# Patient Record
Sex: Female | Born: 1935
Health system: Southern US, Community
[De-identification: ages and names within clinical notes are randomized; demographics above are authoritative.]

## PROBLEM LIST (undated history)

## (undated) DIAGNOSIS — K219 Gastro-esophageal reflux disease without esophagitis: Secondary | ICD-10-CM

## (undated) DIAGNOSIS — M533 Sacrococcygeal disorders, not elsewhere classified: Principal | ICD-10-CM

## (undated) DIAGNOSIS — K56609 Unspecified intestinal obstruction, unspecified as to partial versus complete obstruction: Secondary | ICD-10-CM

## (undated) DIAGNOSIS — R51 Headache: Secondary | ICD-10-CM

## (undated) DIAGNOSIS — H00019 Hordeolum externum unspecified eye, unspecified eyelid: Secondary | ICD-10-CM

## (undated) DIAGNOSIS — M1990 Unspecified osteoarthritis, unspecified site: Secondary | ICD-10-CM

## (undated) DIAGNOSIS — Z9889 Other specified postprocedural states: Secondary | ICD-10-CM

## (undated) DIAGNOSIS — Z8679 Personal history of other diseases of the circulatory system: Secondary | ICD-10-CM

## (undated) DIAGNOSIS — J189 Pneumonia, unspecified organism: Secondary | ICD-10-CM

## (undated) DIAGNOSIS — M549 Dorsalgia, unspecified: Secondary | ICD-10-CM

## (undated) DIAGNOSIS — K589 Irritable bowel syndrome without diarrhea: Secondary | ICD-10-CM

## (undated) DIAGNOSIS — G4730 Sleep apnea, unspecified: Secondary | ICD-10-CM

## (undated) DIAGNOSIS — R519 Headache, unspecified: Secondary | ICD-10-CM

## (undated) DIAGNOSIS — K5900 Constipation, unspecified: Secondary | ICD-10-CM

## (undated) DIAGNOSIS — G2581 Restless legs syndrome: Secondary | ICD-10-CM

## (undated) HISTORY — DX: Personal history of other diseases of the circulatory system: Z86.79

## (undated) HISTORY — DX: Headache: R51

## (undated) HISTORY — DX: Hordeolum externum unspecified eye, unspecified eyelid: H00.019

## (undated) HISTORY — DX: Gastro-esophageal reflux disease without esophagitis: K21.9

## (undated) HISTORY — DX: Sleep apnea, unspecified: G47.30

## (undated) HISTORY — DX: Pneumonia, unspecified organism: J18.9

## (undated) HISTORY — DX: Irritable bowel syndrome, unspecified: K58.9

## (undated) HISTORY — DX: Unspecified intestinal obstruction, unspecified as to partial versus complete obstruction: K56.609

## (undated) HISTORY — DX: Unspecified osteoarthritis, unspecified site: M19.90

## (undated) HISTORY — DX: Restless legs syndrome: G25.81

## (undated) HISTORY — DX: Sacrococcygeal disorders, not elsewhere classified: M53.3

## (undated) HISTORY — DX: Other specified postprocedural states: Z98.89

## (undated) HISTORY — DX: Dorsalgia, unspecified: M54.9

## (undated) HISTORY — DX: Headache, unspecified: R51.9

## (undated) HISTORY — DX: Constipation, unspecified: K59.00

---
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
## 1977-09-16 HISTORY — PX: ABDOMINAL HYSTERECTOMY: SHX81

## 1995-09-17 HISTORY — PX: LIVER BIOPSY: SHX301

## 2003-09-17 HISTORY — PX: APOGEE / PERIGEE REPAIR: SHX1182

## 2003-09-17 HISTORY — PX: EYE SURGERY: SHX253

## 2004-09-16 HISTORY — PX: SPINE SURGERY: SHX786

## 2008-06-16 ENCOUNTER — Ambulatory Visit: Payer: Self-pay | Admitting: Sports Medicine

## 2008-06-16 DIAGNOSIS — M2170 Unequal limb length (acquired), unspecified site: Secondary | ICD-10-CM | POA: Insufficient documentation

## 2008-06-16 DIAGNOSIS — M533 Sacrococcygeal disorders, not elsewhere classified: Secondary | ICD-10-CM

## 2008-06-16 DIAGNOSIS — M4120 Other idiopathic scoliosis, site unspecified: Secondary | ICD-10-CM | POA: Insufficient documentation

## 2008-06-16 DIAGNOSIS — Z9889 Other specified postprocedural states: Secondary | ICD-10-CM | POA: Insufficient documentation

## 2008-06-16 HISTORY — DX: Other specified postprocedural states: Z98.89

## 2008-06-16 HISTORY — DX: Sacrococcygeal disorders, not elsewhere classified: M53.3

## 2008-06-29 ENCOUNTER — Encounter: Payer: Self-pay | Admitting: Sports Medicine

## 2008-07-18 ENCOUNTER — Ambulatory Visit: Payer: Self-pay | Admitting: Sports Medicine

## 2008-07-18 DIAGNOSIS — M549 Dorsalgia, unspecified: Secondary | ICD-10-CM | POA: Insufficient documentation

## 2008-07-18 HISTORY — DX: Dorsalgia, unspecified: M54.9

## 2009-01-31 LAB — HM MAMMOGRAPHY: HM Mammogram: NORMAL

## 2009-06-13 ENCOUNTER — Emergency Department (HOSPITAL_COMMUNITY): Admission: EM | Admit: 2009-06-13 | Discharge: 2009-06-13 | Payer: Self-pay | Admitting: Emergency Medicine

## 2009-06-14 ENCOUNTER — Ambulatory Visit (HOSPITAL_COMMUNITY): Admission: RE | Admit: 2009-06-14 | Discharge: 2009-06-14 | Payer: Self-pay | Admitting: *Deleted

## 2009-06-27 ENCOUNTER — Ambulatory Visit (HOSPITAL_COMMUNITY): Admission: RE | Admit: 2009-06-27 | Discharge: 2009-06-28 | Payer: Self-pay | Admitting: Orthopedic Surgery

## 2009-07-06 ENCOUNTER — Encounter: Admission: RE | Admit: 2009-07-06 | Discharge: 2009-07-06 | Payer: Self-pay | Admitting: Family Medicine

## 2010-07-19 ENCOUNTER — Encounter: Payer: Self-pay | Admitting: Family Medicine

## 2010-10-02 ENCOUNTER — Ambulatory Visit
Admission: RE | Admit: 2010-10-02 | Discharge: 2010-10-02 | Payer: Self-pay | Source: Home / Self Care | Attending: Family Medicine | Admitting: Family Medicine

## 2010-10-02 DIAGNOSIS — Z8679 Personal history of other diseases of the circulatory system: Secondary | ICD-10-CM | POA: Insufficient documentation

## 2010-10-02 DIAGNOSIS — K219 Gastro-esophageal reflux disease without esophagitis: Secondary | ICD-10-CM

## 2010-10-02 DIAGNOSIS — H00019 Hordeolum externum unspecified eye, unspecified eyelid: Secondary | ICD-10-CM

## 2010-10-02 DIAGNOSIS — K5900 Constipation, unspecified: Secondary | ICD-10-CM | POA: Insufficient documentation

## 2010-10-02 DIAGNOSIS — M1990 Unspecified osteoarthritis, unspecified site: Secondary | ICD-10-CM | POA: Insufficient documentation

## 2010-10-02 DIAGNOSIS — G2581 Restless legs syndrome: Secondary | ICD-10-CM

## 2010-10-02 HISTORY — DX: Restless legs syndrome: G25.81

## 2010-10-02 HISTORY — DX: Hordeolum externum unspecified eye, unspecified eyelid: H00.019

## 2010-10-02 HISTORY — DX: Gastro-esophageal reflux disease without esophagitis: K21.9

## 2010-10-02 HISTORY — DX: Constipation, unspecified: K59.00

## 2010-10-02 HISTORY — DX: Personal history of other diseases of the circulatory system: Z86.79

## 2010-10-02 HISTORY — DX: Unspecified osteoarthritis, unspecified site: M19.90

## 2010-10-16 NOTE — Letter (Signed)
Summary: Historic Patient File  Historic Patient File   Imported ByLevada Schilling 06/29/2008 16:53:50  _____________________________________________________________________  External Attachment:    Type:   Image     Comment:   External Document

## 2010-10-16 NOTE — Assessment & Plan Note (Signed)
Summary: NP/LOW BACK PAIN/LEG DESCREP/WP   Vital Signs:  Patient Profile:   77 Years Old Female Weight:      154 pounds Pulse rate:   81 / minute BP sitting:   138 / 86  Vitals Entered By: Lillia Pauls CMA (June 16, 2008 11:16 AM)                 Chief Complaint:  NP WITH R HIP PAIN X 1 YR.  History of Present Illness: LBP 2006 had surgery for Pinched nerve - dr Evelene Croon in [place] - back laminectomy L3 to L5 moved [place]  back pain started when she was young and had to [place] in [place] blue berries and mushrooms mother used to "pop back"  went to chiropractic for years when she lived in [place] usually helped for short time - this helped upper part  lower back started hurting 10 years ago she has had mutliple therapies PT at times injections were tried before surgery  saw dr Raquel James who noted leg length difference and rt sij pain wondered if orthotics and rehab might help      Past Medical History:    TIA x 2 in 2004    PUD in oct 2008    Arthritis - OA and RA?    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 No apparent alopecia or balding. Eyes:     glasses Msk:     mild scoliosis laminectomy scar from L2 to L5 area no real spasm of paraspinous MM point tender over the Rt SIJ but not on left very limited FABER on RT at 45 deg ER FABER on Lt about 60 deg ER - tight but much better SLR to 60 deg with no sxs at all SIJ movement limited on Rt  strength good to testing heel walk, toe walk, tandem walk within normal although she does sometimes lose balance with turning a bit  Gait she shows a trendelenburg pattern that is slight but consistent with leg length change  leg length show RT is 1 cm shorter than left  Neurologic:     alert & oriented X3.   motor and sensory test of L exts is not remarkable    Impression & Recommendations:  Problem # 1:  SACROILIAC JOINT DYSFUNCTION (ICD-724.6) This seems to be the source of most of her  pain I am not sure whether the leg length triggered this or changes that occured after her laminectomy surgery That could certainly affect her Rt SIJ area  Given standard hip exercise routine do only 5 to 6 reps of 4 exercises at a time with no weight do these twice daily cont walking   Problem # 2:  UNEQUAL LEG LENGTH (ICD-736.81) will add a temporary heel wedge lift to correct  ~ 1 cm this may be enough and she can place them in multiple shoes for next month and see if it helps if helping continue this and buy from [facility]  if not we can consder a permanent orthotic with a leg length adjustment in future Orders: Heel Cushions-[facility] (E4540)   Problem # 3:  SCOLIOSIS, MILD (ICD-737.30) don't think this is clinically significant follow  Problem # 4:  LAMINECTOMY, LUMBAR, HX OF (ICD-V45.89) I think she has recovered well from this and I don't see radicular or disk problems on eval today  Complete Medication List: 1)  Plavix 75 Mg Tabs (Clopidogrel bisulfate) .Marland Kitchen.. 1 by mouth qd 2)  Crestor 5  Mg Tabs (Rosuvastatin calcium) .Marland Kitchen.. 1 by mouth qd 3)  Darvocet A500 100-500 Mg Tabs (Propoxyphene n-apap) .Marland Kitchen.. 1 by mouth q6 hrs 4)  Mirapex 0.25 Mg Tabs (Pramipexole dihydrochloride) .Marland Kitchen.. 1 by mouth qhs 5)  Pantoprazole Sodium 40 Mg Tbec (Pantoprazole sodium) .Marland Kitchen.. 1 by mouth qd    ]

## 2010-10-16 NOTE — Letter (Signed)
Summary: *Consult Note  [facility]  [address]   [place], [place] [address]   Phone: [phone]  Fax: [phone]    Re:    [name] DOB:    29-Jun-1932  Ursula Beath, MD [facility]  Dear Victorino Dike:    Thank you for requesting that we see the above patient for consultation.  A copy of the detailed office note will be sent under separate cover, for your review.  Evaluation today is consistent with:  1)  LAMINECTOMY, LUMBAR, HX OF (ICD-V45.89) 2)  SCOLIOSIS, MILD (ICD-737.30) 3)  UNEQUAL LEG LENGTH (ICD-736.81) 4)  SACROILIAC JOINT DYSFUNCTION (ICD-724.6)   Our recommendation is for: a series of hip exercise to mobilize her Rt SIJ.  If more sever pain we can consider injection of this joint later.  I also added a heel wedge to help correct the leg length difference.  If that helps sufficiently it is inexpensive.  If not she would be a candidate for an orthotic to help cushion and also correct the leg length difference.  She will follow with you but feel free to send her to me if I need to help further with her care- either for orthotics or injection.  She is a nice lady and very committed to staying fit.  I think we can help her achieve this and I appreciate you allowing me to see her.   New Orders include:  1)  [facility]- Consult Leve 3 [99243] 2)  Heel Cushions-[facility] [L3332]   New Medications started today include:  1)  PLAVIX 75 MG TABS (CLOPIDOGREL BISULFATE) 1 by mouth qd 2)  CRESTOR 5 MG TABS (ROSUVASTATIN CALCIUM) 1 by mouth qd 3)  DARVOCET A500 100-500 MG TABS (PROPOXYPHENE N-APAP) 1 by mouth q6 hrs 4)  MIRAPEX 0.25 MG TABS (PRAMIPEXOLE DIHYDROCHLORIDE) 1 by mouth qhs 5)  PANTOPRAZOLE SODIUM 40 MG TBEC (PANTOPRAZOLE SODIUM) 1 by mouth qd   I changed none of her medications.   Thank you for this consultation.  If you have any further questions regarding the care of this patient, please do not hesitate to contact me @  [phone].  Thank you for this opportunity to look after your patient.  Sincerely,  Vincent Gros MD

## 2010-10-16 NOTE — Assessment & Plan Note (Signed)
Summary: ORTHODICS/JW   Vital Signs:  Patient Profile:   77 Years Old Female Pulse rate:   69 / minute BP sitting:   127 / 74  Vitals Entered By: Lillia Pauls CMA (July 18, 2008 10:11 AM)                 Chief Complaint:  ORTHOTICS.  History of Present Illness: 77 yo F here for orthotics s/p R SI joint pain and noted leg length deformity which was remeasured - L about 1 cm longer than R.  Has been using sports insoles with heel lift in them on right.  Doing exercises which have helped as well.  Noted she was at least 40% improved from last visit.  Previously went to chiropractor with minimal benefit.        Physical Exam  General:     Well-developed,well-nourished,in no acute distress; alert,appropriate and cooperative throughout examination Msk:     Leg length R is 1 cm shorter than left Gait appears normal after orthotics though R shoulder still lower than L - her hips now level.    Impression & Recommendations:  Problem # 1:  UNEQUAL LEG LENGTH (ICD-736.81) Assessment: Unchanged  Orders: [facility]- Est Level  3 (52841) [employer] (L3002) Heel Cushions (L2440)  Patient was fitted for a standard, cushioned, semi-rigid orthotic.  The orthotic was heated and the patient stood on the orthotic blank positioned on the orthotic stand. The patient was positioned in subtalar neutral position and 10 degrees of ankle dorsiflexion in a weight bearing stance. After completion of molding a stable based was applied to the orthotic blank.   The blank was ground to a stable position for weight bearing. size 7 red cambray base F2 black styrofoam base posting 1 cm left to RT orthotic with blue med density EVA additional orthotic padding none  given past in heel wedges to use in birkenstocks and other shoes    Problem # 2:  BACK PAIN (ICD-724.5) Assessment: Improved  Her updated medication list for this problem includes:    Darvocet A500 100-500 Mg Tabs  (Propoxyphene n-apap) .Marland Kitchen... 1 by mouth q6 hrs  Orders: [facility]- Est Level  3 (10272) [employer] [phone])  cont exercises but I do think correcting leg length will help with this   Complete Medication List: 1)  Plavix 75 Mg Tabs (Clopidogrel bisulfate) .Marland Kitchen.. 1 by mouth qd 2)  Crestor 5 Mg Tabs (Rosuvastatin calcium) .Marland Kitchen.. 1 by mouth qd 3)  Darvocet A500 100-500 Mg Tabs (Propoxyphene n-apap) .Marland Kitchen.. 1 by mouth q6 hrs 4)  Mirapex 0.25 Mg Tabs (Pramipexole dihydrochloride) .Marland Kitchen.. 1 by mouth qhs 5)  Pantoprazole Sodium 40 Mg Tbec (Pantoprazole sodium) .Marland Kitchen.. 1 by mouth qd    ]

## 2010-10-18 NOTE — Assessment & Plan Note (Signed)
Summary: new to est---sty on eye--ok per nancy//[facility]   Vital Signs:  Patient profile:   77 year old female Menstrual status:  postmenopausal Height:      64.25 inches Weight:      150 pounds BMI:     25.64 Temp:     98.5 degrees F oral Pulse rate:   80 / minute Pulse rhythm:   regular Resp:     12 per minute BP sitting:   130 / 78  (left arm) Cuff size:   regular  Vitals Entered By: Sid Falcon LPN (2010/10/25 10:38 AM)  Nutrition Counseling: Patient's BMI is greater than 25 and therefore counseled on weight management options.     Menstrual Status postmenopausal   History of Present Illness: New patient to establish care.  Patient has chronic problems including history of recurrent TIA 2004 and 2007. She has osteoarthritis involving multiple joints. History of reported diverticulitis. Intermittent GERD. Some recent problems with intermittent constipation generally relieved with prune juice. History of mild hyperlipidemia. Reported restless leg syndrome treated with Mirapex.  Restless leg symptoms stable.  No side effects from medication.  No recent focal neuro symptoms.  Remains on Plavix and Crestor.  Lipids have been well controlled by review of outside labs.  Constipation hx.  Fairly active and generally drinks plenty of fluids.  Has had prior colonscopy but not sure when.  Old records pending.  No hematochezia.  No appetite or weight changes.  No excessive fatigue issues.  Multiple prior surgeries are reviewed. Total abdominal hysterectomy 1979 secondary to fibroids. Previous appendectomy. Lumbar laminectomy 2006. Bilateral cataract surgery. Benign liver tumor resection 1997, presumably hemangioma. Complicated proximal humerus fracture 2010.  New problem of intermittent stye left eye, recurrent. No visual changes.  Warm compresses with some relief.  Family history significant for mother with osteoarthritis.  Patient is married. Nonsmoker. Rare alcohol  use.  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    Physical Exam  General:  Well-developed,well-nourished,in no acute distress; alert,appropriate and  cooperative throughout examination Head:  Normocephalic and atraumatic without  obvious abnormalities. No apparent alopecia or balding. Eyes:  small stye L lower lid. Ears:  External ear exam shows no significant lesions or deformities.  Otoscopic examination reveals clear canals, tympanic membranes are intact bilaterally without bulging, retraction, inflammation or discharge. Hearing is grossly normal bilaterally. Nose:  External nasal examination shows no deformity or inflammation. Nasal mucosa are pink and moist without lesions or exudates. Mouth:  Oral mucosa and oropharynx without lesions or exudates.  Teeth in good repair. Neck:  No deformities, masses, or tenderness noted. Lungs:  Normal respiratory effort, chest expands symmetrically. Lungs are clear to auscultation, no crackles or wheezes. Heart:  normal rate and regular rhythm.   Abdomen:  soft, non-tender, no distention, and no masses.   Msk:  No deformity or scoliosis noted of thoracic or lumbar spine.   Extremities:  No clubbing, cyanosis, edema, or deformity noted with normal full range of motion of all joints.   Neurologic:  alert & oriented X3 and cranial nerves II-XII intact.   Skin:  no rashes and no ecchymoses.   Cervical Nodes:  No lymphadenopathy noted Psych:  Oriented X3, good eye contact, not anxious appearing, and not depressed appearing.     Impression & Recommendations:  Problem # 1:  STYE (ICD-373.11) warm compresses.  Tobrex eye drops.  Problem # 2:  TRANSIENT ISCHEMIC ATTACKS, HX OF (ICD-V12.50) cont with plavix and Crestor.  BP at goal.  Problem # 3:  CONSTIPATION (ICD-564.00) discussed conservative measures.  Problem # 4:  RESTLESS LEG SYNDROME (ICD-333.94)  Problem # 5:  GERD (ICD-530.81)  Her updated medication list for this problem includes:    Lansoprazole 30 Mg Cpdr (Lansoprazole) .Marland Kitchen... As needed for stomach  Problem # 6:  OSTEOARTHRITIS (ICD-715.90)  Her updated medication list for this problem includes:    Tramadol Hcl 50 Mg Tabs (Tramadol hcl) ..... One tab every 6-8  hours as needed pain  Complete Medication List: 1)  Plavix 75 Mg Tabs (Clopidogrel bisulfate) .Marland Kitchen.. 1 by mouth qd 2)  Crestor 5 Mg Tabs (Rosuvastatin calcium) .Marland Kitchen.. 1 by mouth qd 3)  Tramadol Hcl 50 Mg Tabs (Tramadol hcl) .... One tab every 6-8 hours as needed pain 4)  Mirapex 0.25 Mg Tabs (Pramipexole dihydrochloride) .Marland Kitchen.. 1 by mouth qhs 5)  Lansoprazole 30 Mg Cpdr (Lansoprazole) .... As needed for stomach 6)  Tobrex 0.3 % Soln (Tobramycin sulfate) .... 2 drops os qid for 5 days  Patient Instructions: 1)  Please schedule a follow-up appointment in 6 months .  Prescriptions: TOBREX 0.3 % SOLN (TOBRAMYCIN SULFATE) 2 drops OS qid for 5 days  #5 ml x 1   Entered and Authorized by:   Evelena Peat MD   Signed by:   Evelena Peat MD on 10/02/2010   Method used:   Electronically to        [facility]  [place] [address] (retail)       [address]       [place], [place]  [address]       Ph: [phone] or [phone]       Fax: [phone]   RxID:   [id number]    Orders Added: 1)  New Patient Level IV [76283]   Immunization History:  Influenza Immunization History:    Influenza:  historical (07/17/2010)  Pneumovax Immunization History:    Pneumovax:  historical (07/17/2010)    Pneumovax:  given (07/17/2010)   Immunization History:  Influenza  Immunization History:    Influenza:  Historical (07/17/2010)  Pneumovax Immunization History:    Pneumovax:  Historical (07/17/2010)  Preventive Care Screening  Last Pneumovax:    Date:  07/17/2010    Results:  given   Bone Density:    Date:  01/16/2009    Results:  normal std dev  Mammogram:    Date:  01/16/2009    Results:  normal

## 2010-12-10 ENCOUNTER — Telehealth: Payer: Self-pay | Admitting: *Deleted

## 2010-12-10 MED ORDER — AZITHROMYCIN 250 MG PO TABS: 250.0000 mg | ORAL_TABLET | Freq: Every day | ORAL | Status: AC

## 2010-12-10 NOTE — Telephone Encounter (Signed)
Pt husband here for cough, congestion, requesting same med for his wife who has the same sx, OK per Dr Caryl Never

## 2010-12-20 LAB — BASIC METABOLIC PANEL
BUN: 15 mg/dL (ref 6–23)
BUN: 8 mg/dL (ref 6–23)
CO2: 23 mEq/L (ref 19–32)
CO2: 26 mEq/L (ref 19–32)
Calcium: 9 mg/dL (ref 8.4–10.5)
Calcium: 9.6 mg/dL (ref 8.4–10.5)
Chloride: 108 mEq/L (ref 96–112)
Chloride: 109 mEq/L (ref 96–112)
Creatinine, Ser: 0.82 mg/dL (ref 0.4–1.2)
Creatinine, Ser: 0.82 mg/dL (ref 0.4–1.2)
GFR calc Af Amer: >60 mL/min (ref >60)
GFR calc Af Amer: >60 mL/min (ref >60)
GFR calc non Af Amer: >60 mL/min (ref >60)
GFR calc non Af Amer: >60 mL/min (ref >60)
Glucose, Bld: 116 mg/dL — ABNORMAL HIGH (ref 70–99)
Glucose, Bld: 131 mg/dL — ABNORMAL HIGH (ref 70–99)
Potassium: 3.9 mEq/L (ref 3.5–5.1)
Potassium: 4.1 mEq/L (ref 3.5–5.1)
Sodium: 138 mEq/L (ref 135–145)
Sodium: 139 mEq/L (ref 135–145)

## 2010-12-20 LAB — CBC
HCT: 31.8 % — ABNORMAL LOW (ref 36.0–46.0)
HCT: 38.6 % (ref 36.0–46.0)
Hemoglobin: 10.9 g/dL — ABNORMAL LOW (ref 12.0–15.0)
Hemoglobin: 13.4 g/dL (ref 12.0–15.0)
MCHC: 34.3 g/dL (ref 30.0–36.0)
MCHC: 34.6 g/dL (ref 30.0–36.0)
MCV: 89 fL (ref 78.0–100.0)
MCV: 89.5 fL (ref 78.0–100.0)
Platelets: 161 10*3/uL (ref 150–400)
Platelets: 211 10*3/uL (ref 150–400)
RBC: 3.57 MIL/uL — ABNORMAL LOW (ref 3.87–5.11)
RBC: 4.32 MIL/uL (ref 3.87–5.11)
RDW: 14 % (ref 11.5–15.5)
RDW: 14.1 % (ref 11.5–15.5)
WBC: 5.4 10*3/uL (ref 4.0–10.5)
WBC: 6.6 10*3/uL (ref 4.0–10.5)

## 2011-02-06 ENCOUNTER — Other Ambulatory Visit: Payer: Self-pay | Admitting: Family Medicine

## 2011-02-06 MED ORDER — CLOPIDOGREL BISULFATE 75 MG PO TABS: 75.0000 mg | ORAL_TABLET | Freq: Every day | ORAL | Status: DC

## 2011-02-06 NOTE — Telephone Encounter (Signed)
Pt called and is req refill of Plavix 75 mg 3 month supply to [facility] order.

## 2011-02-06 NOTE — Telephone Encounter (Signed)
Rx sent 

## 2011-02-21 ENCOUNTER — Other Ambulatory Visit: Payer: Self-pay | Admitting: Family Medicine

## 2011-02-21 MED ORDER — PRAMIPEXOLE DIHYDROCHLORIDE 0.25 MG PO TABS: 0.2500 mg | ORAL_TABLET | Freq: Every day | ORAL | Status: DC

## 2011-02-21 NOTE — Telephone Encounter (Signed)
PC made to pt home explaining Dr Caryl Never requested return OV in 6 months, to establish first visit was Jan 2011.  I did fill 90 day request to [facility] with 0 refills requesting she call for 6 month return OV

## 2011-02-21 NOTE — Telephone Encounter (Signed)
Pt req new script for pramipexole (MIRAPEX) 0.25 MG tablet to [facility] 90 day supply.

## 2011-04-03 ENCOUNTER — Ambulatory Visit (INDEPENDENT_AMBULATORY_CARE_PROVIDER_SITE_OTHER): Payer: Medicare Other | Admitting: Family Medicine

## 2011-04-03 ENCOUNTER — Encounter: Payer: Self-pay | Admitting: Family Medicine

## 2011-04-03 DIAGNOSIS — M533 Sacrococcygeal disorders, not elsewhere classified: Secondary | ICD-10-CM

## 2011-04-03 DIAGNOSIS — M1990 Unspecified osteoarthritis, unspecified site: Secondary | ICD-10-CM

## 2011-04-03 DIAGNOSIS — G2581 Restless legs syndrome: Secondary | ICD-10-CM

## 2011-04-03 MED ORDER — PRAMIPEXOLE DIHYDROCHLORIDE 0.25 MG PO TABS: 0.2500 mg | ORAL_TABLET | Freq: Every day | ORAL | Status: DC

## 2011-04-03 MED ORDER — ROSUVASTATIN CALCIUM 5 MG PO TABS: 5.0000 mg | ORAL_TABLET | Freq: Every day | ORAL | Status: DC

## 2011-04-03 NOTE — Progress Notes (Signed)
  Subjective:    Patient ID: Briana Fitzgerald, female    DOB: [birth year], 77 y.o.   MRN: [id number]  HPI Patient seen for medical followup. She has problems with sacroiliac dysfunction and some chronic back pain and sacroiliac pain on the right. She has been diagnosed with unequal leg length previously and has inserts. She has mild scoliosis. History of lumbar laminectomy. Some ongoing right sacroiliac pain. Recently used generic tramadol and had bad dreams.   Tylenol without much relief. Has previously had some relief with nonsteroidals.  Takes Prevacid 30 mg day as needed.  Hyperlipidemia treated Crestor 5 mg daily. No myalgias. Lipids have been stable. No history of CAD  History restless leg syndrome. Still has some symptoms with Mirapex 0.25 mg daily. She would like to consider titrating dosage.  No excessive caffeine use.   Review of Systems  Constitutional: Negative for fever, chills, fatigue and unexpected weight change.  Respiratory: Negative for shortness of breath.   Cardiovascular: Negative for chest pain, palpitations and leg swelling.  Genitourinary: Negative for dysuria.  Musculoskeletal: Positive for back pain and arthralgias. Negative for myalgias and joint swelling.  Neurological: Negative for dizziness and headaches.  Hematological: Negative for adenopathy. Does not bruise/bleed easily.  Psychiatric/Behavioral: Negative for dysphoric mood.       Objective:   Physical Exam  Constitutional: She is oriented to person, place, and time. She appears well-developed and well-nourished.  HENT:  Head: Normocephalic and atraumatic.  Mouth/Throat: Oropharynx is clear and moist. No oropharyngeal exudate.  Eyes: Pupils are equal, round, and reactive to light.  Neck: Neck supple. No thyromegaly present.  Cardiovascular: Normal rate, regular rhythm and normal heart sounds.   Pulmonary/Chest: Effort normal and breath sounds normal. No respiratory distress. She has no wheezes. She has  no rales.  Musculoskeletal: She exhibits no edema.       Fairly good range of motion right hip with internal and external rotation. She has some mild right sacroiliac tenderness  Lymphadenopathy:    She has no cervical adenopathy.  Neurological: She is alert and oriented to person, place, and time.  Psychiatric: She has a normal mood and affect. Her behavior is normal.          Assessment & Plan:  #1 history of right sacroiliac dysfunction/pain. No x-rays in several years. Consider repeat x-rays. Discussed other options. Celebrex 200 mg once daily. May benefit from injection but assess response to Celebrex first #2 hyperlipidemia. Continue Crestor 5 mg daily #3 restless leg symptoms. Titrate Mirapex to 0.5 mg daily.

## 2011-04-03 NOTE — Patient Instructions (Signed)
Celebrex one daily for arthritis pain.

## 2011-04-04 ENCOUNTER — Ambulatory Visit (INDEPENDENT_AMBULATORY_CARE_PROVIDER_SITE_OTHER)
Admission: RE | Admit: 2011-04-04 | Discharge: 2011-04-04 | Disposition: A | Discharge status: Home / Self Care | Payer: Medicare Other | Source: Ambulatory Visit | Attending: Family Medicine | Admitting: Family Medicine

## 2011-04-04 DIAGNOSIS — M533 Sacrococcygeal disorders, not elsewhere classified: Secondary | ICD-10-CM

## 2011-04-04 NOTE — Progress Notes (Signed)
Quick Note:  Pt informed ______ 

## 2011-04-05 ENCOUNTER — Other Ambulatory Visit: Payer: Self-pay | Admitting: *Deleted

## 2011-04-05 MED ORDER — PRAMIPEXOLE DIHYDROCHLORIDE 0.25 MG PO TABS: ORAL_TABLET | ORAL | Status: DC

## 2011-04-17 ENCOUNTER — Ambulatory Visit (INDEPENDENT_AMBULATORY_CARE_PROVIDER_SITE_OTHER): Payer: Medicare Other | Admitting: Sports Medicine

## 2011-04-17 DIAGNOSIS — M2170 Unequal limb length (acquired), unspecified site: Secondary | ICD-10-CM

## 2011-04-17 DIAGNOSIS — M999 Biomechanical lesion, unspecified: Secondary | ICD-10-CM

## 2011-04-17 DIAGNOSIS — M533 Sacrococcygeal disorders, not elsewhere classified: Secondary | ICD-10-CM

## 2011-04-17 NOTE — Patient Instructions (Signed)
Pt given handout and written instructions.

## 2011-04-17 NOTE — Assessment & Plan Note (Addendum)
Manipulated to day with improvement.  Pt given verbal consent.   Given exercises to help improve strength of hip musculature and core strength.  Actual leg length difference is small and may correct  Almost completely with manipulation Minimal  Adjustment made to  Orthotics but these do improve gait  Fitted with orthotics see procedure not above.  RTC prn pt told can have more manipulation done by Dr. Katrinka Blazing if necessary at [facility].

## 2011-04-17 NOTE — Assessment & Plan Note (Signed)
Manipulated to day with improvement.  Pt given verbal consent.   Given exercises to help improve strength of hip musculature and core strength.  Fitted with orthotics see procedure not above.  RTC prn 

## 2011-04-17 NOTE — Assessment & Plan Note (Signed)
Manipulated to day with improvement.  Pt given verbal consent.   Given exercises to help improve strength of hip musculature and core strength.  Fitted with orthotics see procedure not above.  RTC prn

## 2011-04-17 NOTE — Progress Notes (Signed)
  Subjective:    Patient ID: Briana Fitzgerald, female    DOB: 31-Jul-1932, 77 y.o.   MRN: [id number]  HPI  Patient referred here for possible orthotics.  She has problems with sacroiliac dysfunction and some chronic back pain and sacroiliac pain on the right. She has been diagnosed with unequal leg length previously and has inserts which has helped somewhat over time.  History of lumbar laminectomy. Pt does notice with increasing activity will have some problems but overall does not know which movements cause to worsen pain or really improve pain. Pt still likes to be active and mostly does walking for exercise.   Orthotics have reduced her pain enough to where she can continue hiking and on a pretty vigorous walking program for her age.  She comes for a new pair to use.    Review of Systems  Constitutional: Negative for fever, fatigue and unexpected weight change.  Respiratory: Negative for shortness of breath.   Cardiovascular: Negative for chest pain, palpitations and leg swelling.  Genitourinary: Negative for dysuria.  Musculoskeletal: Positive for back pain and arthralgias. Negative for myalgias and joint swelling.  Neurological: Negative for dizziness and headaches.  Hematological: Negative for adenopathy. Does not bruise/bleed easily.  Psychiatric/Behavioral: Negative for dysphoric mood.       Objective:   Physical Exam  Constitutional: She is oriented to person, place, and time. She appears well-developed and well-nourished.  Musculoskeletal: Normal range of motion. She exhibits no edema.       Fairly good range of motion right hip with internal and external rotation. OMT Findings: Thoracic: T5 rotated and side bent right Lumbar: L2 rotated and side bent right Sacrum: left on left with tenderness over right superior pole.    Neurological: She is alert and oriented to person, place, and time.  mild leg length discrepancy on right but minimal.   Note walking gait shows  trendelenburg to Rt without orthotics but after orthotics placed she has much less shift;  Manipulation did seem to lessen the degree of leg length difference so suspect some of this is at pelvis w rotation    Patient was fitted for a : standard, cushioned, semi-rigid orthotic. The orthotic was heated and afterward the patient stood on the orthotic blank positioned on the orthotic stand. The patient was positioned in subtalar neutral position and 10 degrees of ankle dorsiflexion in a weight bearing stance. After completion of molding, a stable base was applied to the orthotic blank. The blank was ground to a stable position for weight bearing. Size:7 Base:2 blue EVA on right  Posting:to help with leg length discrepancy ( 1 inch) Additional orthotic padding:5th ray on right     Assessment & Plan:

## 2011-04-22 LAB — HM MAMMOGRAPHY: HM Mammogram: NEGATIVE

## 2011-04-24 ENCOUNTER — Encounter: Payer: Self-pay | Admitting: Family Medicine

## 2011-05-01 ENCOUNTER — Encounter: Payer: Self-pay | Admitting: Family Medicine

## 2011-05-01 ENCOUNTER — Ambulatory Visit (INDEPENDENT_AMBULATORY_CARE_PROVIDER_SITE_OTHER): Payer: Medicare Other | Admitting: Family Medicine

## 2011-05-01 DIAGNOSIS — G2581 Restless legs syndrome: Secondary | ICD-10-CM

## 2011-05-01 DIAGNOSIS — M1990 Unspecified osteoarthritis, unspecified site: Secondary | ICD-10-CM

## 2011-05-01 DIAGNOSIS — M810 Age-related osteoporosis without current pathological fracture: Secondary | ICD-10-CM

## 2011-05-01 MED ORDER — CELECOXIB 200 MG PO CAPS: 200.0000 mg | ORAL_CAPSULE | Freq: Every day | ORAL | Status: DC

## 2011-05-01 MED ORDER — AZITHROMYCIN 250 MG PO TABS: ORAL_TABLET | ORAL | Status: AC

## 2011-05-01 MED ORDER — PRAMIPEXOLE DIHYDROCHLORIDE 0.5 MG PO TABS: 0.5000 mg | ORAL_TABLET | Freq: Every day | ORAL | Status: DC

## 2011-05-01 NOTE — Progress Notes (Signed)
  Subjective:    Patient ID: Briana Fitzgerald, female    DOB: 09-04-1932, 77 y.o.   MRN: [id number]  HPI Follow multiple items.  She has history of osteoarthritis and sacroiliac joint pain. Unequal leg length and has orthotics. Recent use of Celebrex which has helped significantly. Prior history of GERD on chronic photon pump inhibitor. Previous intolerance to ibuprofen and Voltaren with gastrointestinal upset. Has tolerated Celebrex without difficulty. This has improved her arthritic pain significantly and she is requesting refills.  Restless leg syndrome. Recently took 0.25 mg Mirapex 2 tablets at night which helped. She would like new prescription at increased dosage.  Osteoporosis by recent DEXA scan at wrist level but osteopenic at hip. She takes calcium and vitamin D. Previous severe GERD and cannot take bisphosphonates. She is reluctant to consider injectable therapy at this time  Past Medical History  Diagnosis Date  . BACK PAIN 07/18/2008  . SACROILIAC JOINT DYSFUNCTION 06/16/2008  . LAMINECTOMY, LUMBAR, HX OF 06/16/2008  . RESTLESS LEG SYNDROME 10/02/2010  . STYE 10/02/2010  . GERD 10/02/2010  . CONSTIPATION 10/02/2010  . OSTEOARTHRITIS 10/02/2010  . TRANSIENT ISCHEMIC ATTACKS, HX OF 10/02/2010   Past Surgical History  Procedure Date  . Appendectomy 1941  . Abdominal hysterectomy 1979    TAH, fibroids  . Liver biopsy 1997    benign, hemangioma resection  . Eye surgery 2005    catarac,both eyes  . Apogee / perigee repair 2005  . Spine surgery 2006    laminectomy    reports that she has never smoked. She does not have any smokeless tobacco history on file. Her alcohol and drug histories not on file. family history includes Cancer in her paternal aunt; Diabetes in her father; Pancreatitis in her brother; and Stroke in her father. Not on File    Review of Systems  Constitutional: Negative for fever, chills, activity change, appetite change and unexpected weight change.  Eyes:  Negative for visual disturbance.  Respiratory: Negative for cough and shortness of breath.   Cardiovascular: Negative for chest pain, palpitations and leg swelling.  Gastrointestinal: Negative for abdominal pain.  Genitourinary: Negative for dysuria.  Neurological: Negative for syncope and headaches.  Hematological: Negative for adenopathy.       Objective:   Physical Exam  Constitutional: She is oriented to person, place, and time. She appears well-developed and well-nourished.  HENT:  Mouth/Throat: Oropharynx is clear and moist.  Neck: Neck supple. No thyromegaly present.  Cardiovascular: Normal rate, regular rhythm and normal heart sounds.   Pulmonary/Chest: Effort normal and breath sounds normal. No respiratory distress. She has no wheezes. She has no rales.  Musculoskeletal: She exhibits no edema.  Lymphadenopathy:    She has no cervical adenopathy.  Neurological: She is alert and oriented to person, place, and time.  Psychiatric: She has a normal mood and affect. Her behavior is normal.          Assessment & Plan:  #1 restless leg syndrome. Increase Mirapex 0.5 mg tablet one at night with refills given #2 osteoarthritis and chronic sacroiliac pain. Refill Celebrex 200 mg once daily and reviewed possible side effects #3 osteoporosis by recent DEXA scan at wrist level. Discussed options. She does not wish to pursue injectables as above-discussed Reclast, Prolia, Forteo. Recommended upper body exercises. Continue calcium vitamin D

## 2011-05-21 ENCOUNTER — Encounter: Payer: Self-pay | Admitting: Family Medicine

## 2011-06-06 ENCOUNTER — Ambulatory Visit: Payer: Medicare Other | Admitting: Family Medicine

## 2011-06-06 DIAGNOSIS — M2170 Unequal limb length (acquired), unspecified site: Secondary | ICD-10-CM

## 2011-06-06 DIAGNOSIS — M533 Sacrococcygeal disorders, not elsewhere classified: Secondary | ICD-10-CM

## 2011-06-06 NOTE — Progress Notes (Signed)
Subjective:    Patient ID: Briana Fitzgerald, female    DOB: [birth year], 77 y.o.   MRN: [id number]  HPI  Briana Fitzgerald is a pleasant 77 yo female patient who was seen on 04/26/11 for SI joint dysfunction and leg length  discrepancy . She had custom orthotic made which she states that every time she uses the blue customs orthotics the sole of her feet starts burning. She tried different socks, different shoes without any improvements, she tried her old red orthotics without any problem and no burning on the sole of her feet. She would like to have her custom orthotics remade.  Patient Active Problem List  Diagnoses  . BACK PAIN  . SACROILIAC JOINT DYSFUNCTION  . UNEQUAL LEG LENGTH  . SCOLIOSIS, MILD  . LAMINECTOMY, LUMBAR, HX OF  . RESTLESS LEG SYNDROME  . STYE  . GERD  . CONSTIPATION  . OSTEOARTHRITIS  . TRANSIENT ISCHEMIC ATTACKS, HX OF  . Nonallopathic lesion of sacral region  . Osteoporosis   Current Outpatient Prescriptions on File Prior to Visit  Medication Sig Dispense Refill  . celecoxib (CELEBREX) 200 MG capsule Take 1 capsule (200 mg total) by mouth daily.  90 capsule  3  . clopidogrel (PLAVIX) 75 MG tablet Take 1 tablet (75 mg total) by mouth daily.  90 tablet  2  . lansoprazole (PREVACID) 30 MG capsule Take 30 mg by mouth as needed. For stomach.       . pramipexole (MIRAPEX) 0.5 MG tablet Take 1 tablet (0.5 mg total) by mouth at bedtime.  90 tablet  3  . rosuvastatin (CRESTOR) 5 MG tablet Take 1 tablet (5 mg total) by mouth daily.  30 tablet  11  . tobramycin (TOBREX) 0.3 % ophthalmic solution Place 2 drops into the left eye 4 (four) times daily. For 5 days.            Review of Systems  Constitutional: Negative for fever, chills and fatigue.  Musculoskeletal: Negative for back pain, joint swelling and gait problem.  Neurological: Negative for tremors, seizures and weakness.       Objective:   Physical Exam  Constitutional: She appears well-developed and  well-nourished.       There were no vitals taken for this visit.   Pulmonary/Chest: Effort normal.  Musculoskeletal:       Feet with intact skin , no swelling no inflammations. Feet with low arch. There is a leg length discrepancy been the right leg shorter than the left leg by 2 cm. Gait with mild shifting to the right side. SI joint with good mobility, faber test negative B/l.   Neurological: She is alert.  Skin: Skin is warm. No rash noted. No erythema. No pallor.  Psychiatric: She has a normal mood and affect.      Patient was fitted for a : standard, cushioned, semi-rigid orthotic.  The orthotic was heated and afterward the patient stood on the orthotic blank positioned on the orthotic stand.  The patient was positioned in subtalar neutral position and 10 degrees of ankle dorsiflexion in a weight bearing stance.  After completion of molding, a stable base was applied to the orthotic blank.  The blank was ground to a stable position for weight bearing.  Size:7  Base:2 Red bases  Posting: doable posting on the right to help with leg length discrepancy ( 1 inch)  Additional orthotic padding:5th ray on right       Assessment & Plan:   1. SACROILIAC JOINT  DYSFUNCTION   2. UNEQUAL LEG LENGTH    Custom orthotic were remade using the red orthotic material.  NO CHARGES WERE MADE FOR THE NEW CUSTOM ORTHOTICS MADE TODAY

## 2011-06-24 ENCOUNTER — Telehealth: Payer: Self-pay | Admitting: Family Medicine

## 2011-06-24 NOTE — Telephone Encounter (Signed)
Pt states the first name is Briana Fitzgerald, "he's a new young guy".  I called the number provided after hours and it was " [facility] ".

## 2011-06-24 NOTE — Telephone Encounter (Signed)
Patient would like to be referred to someone that specializes in osteoporosis. His name is Dr Katrinka Blazing ---phone---[phone].

## 2011-06-24 NOTE — Telephone Encounter (Signed)
Can she give up full name?  I'm not aware of any Dr Lonn Georgia who specialize in osteoporosis.

## 2011-06-24 NOTE — Telephone Encounter (Signed)
Please advise 

## 2011-06-24 NOTE — Telephone Encounter (Signed)
He is not an osteoporosis specialist.  He is a family practice resident.  Would not refer.  I'm not sure how she got his name. We can discuss ALL options available for treating osteoporosis with her.  If she wants to transfer care to him that is OK with me but she needs to understand he is a resident and residents are doctors in training and there for 3 years and then gone (unless they stay in the area to practice).  That is where I precept once per month.

## 2011-06-25 NOTE — Telephone Encounter (Signed)
Pt informed, offered return OV, pt will think about it

## 2011-07-01 ENCOUNTER — Telehealth: Payer: Self-pay | Admitting: Family Medicine

## 2011-07-01 MED ORDER — ROSUVASTATIN CALCIUM 5 MG PO TABS: 5.0000 mg | ORAL_TABLET | Freq: Every day | ORAL | Status: DC

## 2011-07-01 NOTE — Telephone Encounter (Signed)
Pt requesting refill on rosuvastatin (CRESTOR) 5 MG tablet   please send to [facility]

## 2011-08-30 ENCOUNTER — Ambulatory Visit: Payer: Medicare Other | Admitting: Family Medicine

## 2011-09-02 ENCOUNTER — Encounter: Payer: Self-pay | Admitting: Family Medicine

## 2011-09-02 ENCOUNTER — Ambulatory Visit (INDEPENDENT_AMBULATORY_CARE_PROVIDER_SITE_OTHER): Payer: Medicare Other | Admitting: Family Medicine

## 2011-09-02 DIAGNOSIS — E785 Hyperlipidemia, unspecified: Secondary | ICD-10-CM

## 2011-09-02 DIAGNOSIS — G2581 Restless legs syndrome: Secondary | ICD-10-CM

## 2011-09-02 DIAGNOSIS — K219 Gastro-esophageal reflux disease without esophagitis: Secondary | ICD-10-CM

## 2011-09-02 DIAGNOSIS — J329 Chronic sinusitis, unspecified: Secondary | ICD-10-CM

## 2011-09-02 DIAGNOSIS — K5900 Constipation, unspecified: Secondary | ICD-10-CM

## 2011-09-02 LAB — TSH: TSH: 1.63 u[IU]/mL (ref 0.35–5.50)

## 2011-09-02 LAB — HEPATIC FUNCTION PANEL
ALT: 21 U/L (ref 0–35)
AST: 23 U/L (ref 0–37)
Albumin: 3.9 g/dL (ref 3.5–5.2)
Alkaline Phosphatase: 69 U/L (ref 39–117)
Bilirubin, Direct: 0.1 mg/dL (ref 0.0–0.3)
Total Bilirubin: 1.1 mg/dL (ref 0.3–1.2)
Total Protein: 7 g/dL (ref 6.0–8.3)

## 2011-09-02 LAB — LIPID PANEL
Cholesterol: 121 mg/dL (ref 0–200)
HDL: 39.8 mg/dL (ref >39.00)
LDL Cholesterol: 58 mg/dL (ref 0–99)
Total CHOL/HDL Ratio: 3
Triglycerides: 115 mg/dL (ref 0.0–149.0)
VLDL: 23 mg/dL (ref 0.0–40.0)

## 2011-09-02 MED ORDER — HYDROCODONE-HOMATROPINE 5-1.5 MG/5ML PO SYRP: 5.0000 mL | ORAL_SOLUTION | Freq: Four times a day (QID) | ORAL | Status: AC | PRN

## 2011-09-02 MED ORDER — AMOXICILLIN-POT CLAVULANATE 875-125 MG PO TABS: 1.0000 | ORAL_TABLET | Freq: Two times a day (BID) | ORAL | Status: AC

## 2011-09-02 NOTE — Progress Notes (Signed)
  Subjective:    Patient ID: Briana Fitzgerald, female    DOB: Oct 17, 1931, 77 y.o.   MRN: [id number]  HPI  Medical followup and for acute visit. She's had cough for almost one month. Developed initially some sore throat and has had at least 3 weeks of cough productive of green sputum also some nasal congestion with green sputum. Intermittent frontal sinus pressure. No fevers or chills. No known drug allergies. Cough is especially bothersome at night and not relieved with over-the-counter medications.  History of hyperlipidemia. Needs lab work. No myalgias. No history of CAD or peripheral vascular disease.  Restless leg syndrome. Treated with Mirapex and symptoms well controlled. History of GERD treated with Prevacid. No active reflux symptoms.   Review of Systems  Constitutional: Negative for fever, appetite change, fatigue and unexpected weight change.  HENT: Positive for sore throat and sinus pressure.   Respiratory: Positive for cough. Negative for shortness of breath and wheezing.   Cardiovascular: Negative for chest pain, palpitations and leg swelling.  Gastrointestinal: Negative for abdominal pain.  Genitourinary: Negative for dysuria.  Neurological: Negative for dizziness and headaches.       Objective:   Physical Exam  Constitutional: She is oriented to person, place, and time. She appears well-developed and well-nourished. No distress.  HENT:  Right Ear: External ear normal.  Left Ear: External ear normal.  Mouth/Throat: Oropharynx is clear and moist.  Neck: Neck supple.  Cardiovascular: Normal rate and regular rhythm.   Pulmonary/Chest: Effort normal and breath sounds normal. No respiratory distress. She has no wheezes. She has no rales.  Musculoskeletal: She exhibits no edema.  Lymphadenopathy:    She has no cervical adenopathy.  Neurological: She is alert and oriented to person, place, and time.          Assessment & Plan:  #1 acute sinusitis/bronchitis. Given  duration, start Augmentin 875 mg twice daily for 10 days. Hycodan cough syrup for nighttime use as needed #2 restless leg syndrome stable continue current medication #3 GERD stable continue Prevacid #4 hyperlipidemia. Remote history of TIA. Check lipid and hepatic panel

## 2011-09-03 NOTE — Progress Notes (Signed)
Quick Note:  Pt informed on home VM ______ 

## 2011-09-23 ENCOUNTER — Telehealth: Payer: Self-pay | Admitting: Family Medicine

## 2011-09-23 NOTE — Telephone Encounter (Signed)
Pt would like you to mail her a copy of her lab results from 12/17 please.

## 2011-09-23 NOTE — Telephone Encounter (Signed)
Labs mailed

## 2011-10-10 ENCOUNTER — Telehealth: Payer: Self-pay | Admitting: Family Medicine

## 2011-10-10 MED ORDER — CELECOXIB 200 MG PO CAPS: 200.0000 mg | ORAL_CAPSULE | Freq: Every day | ORAL | Status: DC

## 2011-10-10 MED ORDER — PRAMIPEXOLE DIHYDROCHLORIDE 0.5 MG PO TABS: 0.5000 mg | ORAL_TABLET | Freq: Every day | ORAL | Status: DC

## 2011-10-10 MED ORDER — ROSUVASTATIN CALCIUM 5 MG PO TABS: 5.0000 mg | ORAL_TABLET | Freq: Every day | ORAL | Status: DC

## 2011-10-10 MED ORDER — CLOPIDOGREL BISULFATE 75 MG PO TABS: 75.0000 mg | ORAL_TABLET | Freq: Every day | ORAL | Status: DC

## 2011-10-10 MED ORDER — LANSOPRAZOLE 30 MG PO CPDR: 30.0000 mg | DELAYED_RELEASE_CAPSULE | ORAL | Status: DC | PRN

## 2011-10-10 NOTE — Telephone Encounter (Signed)
I could not find [facility] in mail order pharmacy to send electronically.  Printed and faxed as requested

## 2011-10-10 NOTE — Telephone Encounter (Signed)
rx mail order has changed this year. Patient would like for all of her meds sent to new mail order pharmacy which is [facility]---Fax ---[phone]. Thanks.

## 2011-10-25 ENCOUNTER — Encounter: Payer: Self-pay | Admitting: Family Medicine

## 2011-10-25 ENCOUNTER — Ambulatory Visit (INDEPENDENT_AMBULATORY_CARE_PROVIDER_SITE_OTHER): Payer: Medicare Other | Admitting: Family Medicine

## 2011-10-25 VITALS — BP 122/70 | HR 86 | Temp 98.4°F | Wt 152.0 lb

## 2011-10-25 DIAGNOSIS — S0083XA Contusion of other part of head, initial encounter: Secondary | ICD-10-CM

## 2011-10-25 DIAGNOSIS — S0003XA Contusion of scalp, initial encounter: Secondary | ICD-10-CM | POA: Diagnosis not present

## 2011-10-25 NOTE — Patient Instructions (Signed)
Continue with icing for the next day.

## 2011-10-25 NOTE — Progress Notes (Signed)
  Subjective:    Patient ID: Briana Fitzgerald, female    DOB: July 29, 1932, 78 y.o.   MRN: [id number]  HPI  Bruising right chin region. Noted today. Wednesday she went for dental work and had filling right posterior molar area she did not notice any pain afterwards. Denies any injury otherwise. No difficulty swallowing. No headaches. No difficulty chewing. Takes Plavix. No other bleeding complications.   Review of Systems  Constitutional: Negative for fever, chills and unexpected weight change.  Respiratory: Negative for shortness of breath.   Neurological: Negative for headaches.  Hematological: Does not bruise/bleed easily.       Objective:   Physical Exam  Constitutional: She appears well-developed and well-nourished.  HENT:       Patient has area of ecchymosis right chin and lower mandible region approximately 4 x 4 centimeters. Mostly nontender. Oropharynx reveals area of bruising about 4 x 5 mm just superior and lateral to right lower posterior molar.  Neck: Neck supple. No thyromegaly present.  Cardiovascular: Normal rate and regular rhythm.   Pulmonary/Chest: Effort normal and breath sounds normal. No respiratory distress. She has no wheezes. She has no rales.          Assessment & Plan:  Facial contusion. Suspect related to recent dental work. Patient reassured. No evidence for significant hematoma. Continue icing for the next day. Continue Plavix.

## 2012-01-02 ENCOUNTER — Telehealth: Payer: Self-pay | Admitting: Family Medicine

## 2012-01-02 DIAGNOSIS — Z961 Presence of intraocular lens: Secondary | ICD-10-CM | POA: Diagnosis not present

## 2012-01-02 DIAGNOSIS — H353190 Nonexudative age-related macular degeneration, unspecified eye, stage unspecified: Secondary | ICD-10-CM | POA: Diagnosis not present

## 2012-01-02 DIAGNOSIS — H18519 Endothelial corneal dystrophy, unspecified eye: Secondary | ICD-10-CM | POA: Diagnosis not present

## 2012-01-02 NOTE — Telephone Encounter (Signed)
Pt called and has an ov to see Dr Marvis Repress today at 2 pm re: her eyes. Pt said that she needs to get a referral from Dr Caryl Never in order to be able to keep this appt today.

## 2012-01-02 NOTE — Telephone Encounter (Signed)
I called Dr Baxter Hire office and she does not need a referral.  Pt informed

## 2012-02-06 ENCOUNTER — Telehealth: Payer: Self-pay | Admitting: Family Medicine

## 2012-02-06 MED ORDER — PRAMIPEXOLE DIHYDROCHLORIDE 0.5 MG PO TABS: 0.5000 mg | ORAL_TABLET | Freq: Every day | ORAL | Status: DC

## 2012-02-06 NOTE — Telephone Encounter (Signed)
Pt informed

## 2012-02-06 NOTE — Telephone Encounter (Signed)
Pt called and wanted to know if she has sample of generic Mirapex for restless leg syndrome. Pt ordered med from pharmacy via mail order, but med will not be available until next week.

## 2012-02-19 DIAGNOSIS — H18519 Endothelial corneal dystrophy, unspecified eye: Secondary | ICD-10-CM | POA: Diagnosis not present

## 2012-03-02 ENCOUNTER — Ambulatory Visit (INDEPENDENT_AMBULATORY_CARE_PROVIDER_SITE_OTHER): Payer: Medicare Other | Admitting: Family Medicine

## 2012-03-02 ENCOUNTER — Encounter: Payer: Self-pay | Admitting: Family Medicine

## 2012-03-02 VITALS — BP 130/64 | Temp 98.8°F | Wt 155.0 lb

## 2012-03-02 DIAGNOSIS — E785 Hyperlipidemia, unspecified: Secondary | ICD-10-CM

## 2012-03-02 DIAGNOSIS — K5900 Constipation, unspecified: Secondary | ICD-10-CM

## 2012-03-02 DIAGNOSIS — G2581 Restless legs syndrome: Secondary | ICD-10-CM

## 2012-03-02 DIAGNOSIS — M1990 Unspecified osteoarthritis, unspecified site: Secondary | ICD-10-CM | POA: Diagnosis not present

## 2012-03-02 DIAGNOSIS — L723 Sebaceous cyst: Secondary | ICD-10-CM

## 2012-03-02 NOTE — Patient Instructions (Addendum)

## 2012-03-02 NOTE — Progress Notes (Signed)
  Subjective:    Patient ID: Briana Fitzgerald, female    DOB: 25-May-1932, 78 y.o.   MRN: [id number]  HPI  Medical followup. Patient history hyperlipidemia, osteoporosis, GERD, restless leg syndrome and intermittent constipation. Her constipation problem has gone on for several years. She had recent TSH this past December normal. She takes flaxseed and fiber supplements. Tries to eat lots of natural fiber. Drinks lots of water. Walks some for exercise but not consistently.  Cystic lesion left for head. Nonpainful. Patient requesting referral to dermatologist for further evaluation. Never infected. Slow growth over several years.  Immunizations reviewed. No history of shingles vaccine. She thinks last tetanus less than 10 years. Previous Pneumovax.  Hyperlipidemia treated with Crestor. No myalgias. No history of peripheral vascular disease or CAD. Restless leg syndrome stable with Mirapex. We recently increased dosage which has helped somewhat.  Past Medical History  Diagnosis Date  . BACK PAIN 07/18/2008  . SACROILIAC JOINT DYSFUNCTION 06/16/2008  . LAMINECTOMY, LUMBAR, HX OF 06/16/2008  . RESTLESS LEG SYNDROME 10/02/2010  . STYE 10/02/2010  . GERD 10/02/2010  . CONSTIPATION 10/02/2010  . OSTEOARTHRITIS 10/02/2010  . TRANSIENT ISCHEMIC ATTACKS, HX OF 10/02/2010   Past Surgical History  Procedure Date  . Appendectomy 1941  . Abdominal hysterectomy 1979    TAH, fibroids  . Liver biopsy 1997    benign, hemangioma resection  . Eye surgery 2005    catarac,both eyes  . Apogee / perigee repair 2005  . Spine surgery 2006    laminectomy    reports that she has never smoked. She does not have any smokeless tobacco history on file. Her alcohol and drug histories not on file. family history includes Cancer in her paternal aunt; Diabetes in her father; Pancreatitis in her brother; and Stroke in her father. No Known Allergies    Review of Systems  Constitutional: Negative for fatigue.  Eyes:  Negative for visual disturbance.  Respiratory: Negative for cough, chest tightness, shortness of breath and wheezing.   Cardiovascular: Negative for chest pain, palpitations and leg swelling.  Gastrointestinal: Positive for constipation. Negative for nausea, vomiting and abdominal pain.  Genitourinary: Negative for dysuria.  Neurological: Negative for dizziness, seizures, syncope, weakness, light-headedness and headaches.  Hematological: Negative for adenopathy.       Objective:   Physical Exam  Constitutional: She is oriented to person, place, and time. She appears well-developed and well-nourished.  HENT:  Right Ear: External ear normal.  Left Ear: External ear normal.  Mouth/Throat: Oropharynx is clear and moist.  Neck: Neck supple. No thyromegaly present.  Cardiovascular: Normal rate and regular rhythm.   Pulmonary/Chest: Effort normal and breath sounds normal. No respiratory distress. She has no wheezes. She has no rales.  Musculoskeletal: She exhibits no edema.  Lymphadenopathy:    She has no cervical adenopathy.  Neurological: She is alert and oriented to person, place, and time.  Skin:       Patient has mobile cystic lesion left forehead which is approximately 1 cm diameter. No fluctuance. No erythema or warmth.          Assessment & Plan:  #1 constipation. Chronic. Handout given. Consider addition of stool softener as needed. #2 restless leg syndrome stable on Mirapex. Continue current medication #3 hyperlipidemia. Lipids at goal last fall. Recheck lipids and hepatic in 6 months #4 benign cyst left for head. Probable sebaceous cyst. Referral to dermatology per patient request #5 health maintenance. Check on coverage for shingles vaccine

## 2012-03-25 DIAGNOSIS — H18519 Endothelial corneal dystrophy, unspecified eye: Secondary | ICD-10-CM | POA: Diagnosis not present

## 2012-03-25 DIAGNOSIS — H353190 Nonexudative age-related macular degeneration, unspecified eye, stage unspecified: Secondary | ICD-10-CM | POA: Diagnosis not present

## 2012-03-25 DIAGNOSIS — Z961 Presence of intraocular lens: Secondary | ICD-10-CM | POA: Diagnosis not present

## 2012-03-30 ENCOUNTER — Telehealth: Payer: Self-pay | Admitting: Family Medicine

## 2012-03-30 NOTE — Telephone Encounter (Signed)
Patient called stating she was calling the nurse with info she requested and would like a call back. Please assist.

## 2012-03-30 NOTE — Telephone Encounter (Signed)
Pt was given [facility] phone number so she can check on her appt

## 2012-04-01 ENCOUNTER — Telehealth: Payer: Self-pay | Admitting: *Deleted

## 2012-04-01 DIAGNOSIS — E785 Hyperlipidemia, unspecified: Secondary | ICD-10-CM

## 2012-04-01 DIAGNOSIS — K5909 Other constipation: Secondary | ICD-10-CM

## 2012-04-01 NOTE — Telephone Encounter (Signed)
PC from pt requesting referral to a nutritionist for constant struggle with constipation Also requesting a Rx be faxed to [facility] for shingles vaccine  Per Dr Caryl Never, OK to refer to nutrition, shingles vaccine faxed

## 2012-04-02 ENCOUNTER — Other Ambulatory Visit: Payer: Self-pay | Admitting: *Deleted

## 2012-04-02 MED ORDER — ZOSTER VACCINE LIVE 19400 UNT/0.65ML ~~LOC~~ SOLR: 0.6500 mL | Freq: Once | SUBCUTANEOUS | Status: AC

## 2012-04-20 DIAGNOSIS — D485 Neoplasm of uncertain behavior of skin: Secondary | ICD-10-CM | POA: Diagnosis not present

## 2012-04-20 DIAGNOSIS — L723 Sebaceous cyst: Secondary | ICD-10-CM | POA: Diagnosis not present

## 2012-04-27 ENCOUNTER — Telehealth: Payer: Self-pay | Admitting: Family Medicine

## 2012-04-27 NOTE — Telephone Encounter (Signed)
Caller: Marlee/Patient; Patient Name: Briana Fitzgerald; PCP: Evelena Peat; Best Callback Phone Number: [phone]. Onset 04/18/12:   pt reports having a bone spur on her right heel. All emergent symptoms of Foot Non Injury ruled out with exception to 'Sensation of numbness, tingling, extreme sensitivity, shooting or burning discomfort that occurs with movement and stops with rest and not previously evaluated'. Home care advice given, RN tried to schedule appt for 04/28/12 but no appt found (disposition of See Provider in 24 hours)

## 2012-04-28 NOTE — Telephone Encounter (Signed)
Patient called back this a.m. And made appt w/Dr. Caryl Never for tomorrow. Was not able to get here this morning.

## 2012-04-29 ENCOUNTER — Encounter: Payer: Self-pay | Admitting: Family Medicine

## 2012-04-29 ENCOUNTER — Ambulatory Visit (INDEPENDENT_AMBULATORY_CARE_PROVIDER_SITE_OTHER): Payer: Medicare Other | Admitting: Family Medicine

## 2012-04-29 VITALS — BP 130/70 | Temp 98.5°F | Wt 151.0 lb

## 2012-04-29 DIAGNOSIS — M722 Plantar fascial fibromatosis: Secondary | ICD-10-CM

## 2012-04-29 DIAGNOSIS — E785 Hyperlipidemia, unspecified: Secondary | ICD-10-CM

## 2012-04-29 MED ORDER — SIMVASTATIN 20 MG PO TABS: 20.0000 mg | ORAL_TABLET | Freq: Every day | ORAL | Status: DC

## 2012-04-29 NOTE — Patient Instructions (Addendum)

## 2012-04-29 NOTE — Progress Notes (Signed)
  Subjective:    Patient ID: Briana Fitzgerald, female    DOB: 09-07-1932, 78 y.o.   MRN: [id number]  HPI  Right heel pain. Present for about 2 weeks. No injury. No change of shoe wear. Pain is near attachment of plantar fascia to calcaneus. Possibly some mild swelling. No Achilles pain. Worse first thing in morning and at night after prolonged periods of sitting. Improve somewhat after activity. She tried heel cup had without much improvement.  Hyperlipidemia treated with Crestor 5 mg. Requesting generic alternative. Denies prior intolerance to other statins. No history of CAD. Does have history of reported TIA.  Past Medical History  Diagnosis Date  . BACK PAIN 07/18/2008  . SACROILIAC JOINT DYSFUNCTION 06/16/2008  . LAMINECTOMY, LUMBAR, HX OF 06/16/2008  . RESTLESS LEG SYNDROME 10/02/2010  . STYE 10/02/2010  . GERD 10/02/2010  . CONSTIPATION 10/02/2010  . OSTEOARTHRITIS 10/02/2010  . TRANSIENT ISCHEMIC ATTACKS, HX OF 10/02/2010   Past Surgical History  Procedure Date  . Appendectomy 1941  . Abdominal hysterectomy 1979    TAH, fibroids  . Liver biopsy 1997    benign, hemangioma resection  . Eye surgery 2005    catarac,both eyes  . Apogee / perigee repair 2005  . Spine surgery 2006    laminectomy    reports that she has never smoked. She does not have any smokeless tobacco history on file. Her alcohol and drug histories not on file. family history includes Cancer in her paternal aunt; Diabetes in her father; Pancreatitis in her brother; and Stroke in her father. No Known Allergies    Review of Systems  Constitutional: Negative for fever and chills.  Respiratory: Negative for shortness of breath.   Cardiovascular: Negative for chest pain.  Musculoskeletal: Negative for myalgias, arthralgias and gait problem.  Skin: Negative for rash.  Hematological: Does not bruise/bleed easily.       Objective:   Physical Exam  Constitutional: She appears well-developed and well-nourished.    Cardiovascular: Normal rate and regular rhythm.   Pulmonary/Chest: Effort normal and breath sounds normal. No respiratory distress. She has no wheezes. She has no rales.  Musculoskeletal:       Right foot reveals no edema. She has tenderness palpating attachment of plantar fascia to calcaneus region. No Achilles tenderness. Good distal foot pulses. No bony tenderness.          Assessment & Plan:  #1 right plantar fasciitis. Reviewed heel cups, stretches, icing, brief use of over-the-counter anti-inflammatory such as Aleve. Offered corticosteroid injection at this point she wishes to wait #2 hyperlipidemia. Secondary to cost issues discontinue Crestor and start simvastatin 20 mg once daily. Repeat lipids in approximately 2 months

## 2012-04-30 ENCOUNTER — Encounter: Payer: Self-pay | Admitting: *Deleted

## 2012-04-30 ENCOUNTER — Encounter: Payer: Medicare Other | Attending: Family Medicine | Admitting: *Deleted

## 2012-04-30 ENCOUNTER — Other Ambulatory Visit: Payer: Self-pay | Admitting: *Deleted

## 2012-04-30 VITALS — Ht 65.0 in | Wt 151.0 lb

## 2012-04-30 DIAGNOSIS — Z713 Dietary counseling and surveillance: Secondary | ICD-10-CM | POA: Diagnosis not present

## 2012-04-30 DIAGNOSIS — E785 Hyperlipidemia, unspecified: Secondary | ICD-10-CM | POA: Insufficient documentation

## 2012-04-30 DIAGNOSIS — K5900 Constipation, unspecified: Secondary | ICD-10-CM | POA: Diagnosis not present

## 2012-04-30 MED ORDER — SIMVASTATIN 20 MG PO TABS: 20.0000 mg | ORAL_TABLET | Freq: Every day | ORAL | Status: DC

## 2012-04-30 NOTE — Patient Instructions (Addendum)
Plan: Continue with whole grain breads, cereals and rice as tolerated Continue with fresh fruit 2-3 servings per day including the skin Continue with warm liquids in AM and up to 8 glasses of fluid each day Continue with flax seed added to breakfast foods Consider adding wheat bran to foods (yogurt, vegetables, potatoes) to increase fiber content

## 2012-04-30 NOTE — Progress Notes (Signed)
  Medical Nutrition Therapy:  Appt start time: 0900 end time:  1000.  Assessment:  Primary concerns today: patient here for problems with constipation. She states she has had problems with constipation for much of her life. She already chooses high fiber foods and states she drinks plenty of fluids. She lives with her husband and is active during her day.   MEDICATIONS: see list   DIETARY INTAKE:  Usual eating pattern includes 3 meals and 1-2 snacks per day.  Everyday foods include very food variety of all food groups including high fiber foods.  Avoided foods include fried or other high fat foods.    24-hr recall:  B ( AM): fresh fruit, cereal with yogurt and flaxseed, OR rye bread with jam, coffee  Snk ( AM): infrequently fresh fruit (grapes)  L ( PM): hot meal, meat usually and vegetables, starch, green tea or diluted fruit juice  Snk ( PM): coffee and 2 cookies  D ( PM): salad with lettuce, apple, tomato, cheese, walnuts, Ranch dressing, water or iced tea Snk ( PM): infrequently ice cream or an apple Beverages: coffee, water, diluted fruit juice,   Usual physical activity: used to walk 2 miles 4-5 times a week but not now due to foot pain. Floor exercises and 3 pound weights.  Estimated energy needs: 1500 calories 170 g carbohydrates 112 g protein 42 g fat  Progress Towards Goal(s):  In progress.   Nutritional Diagnosis:  NI-5.8.5 Inadeqate fiber intake As related to constipation.  As evidenced by unresolved problem.    Intervention:  Nutrition counseling regarding high fiber diet. Discussed soluble and insoluble fiber and their sources and provided high fiber food list. Encouraged her to continue with her water intake and activity level as tolerated. Plan: Continue with whole grain breads, cereals and rice as tolerated Continue with fresh fruit 2-3 servings per day including the skin Continue with warm liquids in AM and up to 8 glasses of fluid each day Continue with flax  seed added to breakfast foods Consider adding wheat bran to foods (yogurt, vegetables, potatoes) to increase fiber content  Handouts given during visit include:  High Fiber Food list  Monitoring/Evaluation:  Dietary intake, exercise, reading food labels, and body weight prn.

## 2012-05-13 DIAGNOSIS — C44519 Basal cell carcinoma of skin of other part of trunk: Secondary | ICD-10-CM | POA: Diagnosis not present

## 2012-05-13 DIAGNOSIS — L821 Other seborrheic keratosis: Secondary | ICD-10-CM | POA: Diagnosis not present

## 2012-05-13 DIAGNOSIS — L820 Inflamed seborrheic keratosis: Secondary | ICD-10-CM | POA: Diagnosis not present

## 2012-05-13 DIAGNOSIS — L723 Sebaceous cyst: Secondary | ICD-10-CM | POA: Diagnosis not present

## 2012-06-08 DIAGNOSIS — Z23 Encounter for immunization: Secondary | ICD-10-CM | POA: Diagnosis not present

## 2012-07-06 DIAGNOSIS — Z01810 Encounter for preprocedural cardiovascular examination: Secondary | ICD-10-CM | POA: Diagnosis not present

## 2012-07-09 DIAGNOSIS — K219 Gastro-esophageal reflux disease without esophagitis: Secondary | ICD-10-CM | POA: Diagnosis not present

## 2012-07-09 DIAGNOSIS — Z8673 Personal history of transient ischemic attack (TIA), and cerebral infarction without residual deficits: Secondary | ICD-10-CM | POA: Diagnosis not present

## 2012-07-09 DIAGNOSIS — G4733 Obstructive sleep apnea (adult) (pediatric): Secondary | ICD-10-CM | POA: Diagnosis not present

## 2012-07-09 DIAGNOSIS — Z961 Presence of intraocular lens: Secondary | ICD-10-CM | POA: Diagnosis not present

## 2012-07-09 DIAGNOSIS — Z79899 Other long term (current) drug therapy: Secondary | ICD-10-CM | POA: Diagnosis not present

## 2012-07-09 DIAGNOSIS — G43909 Migraine, unspecified, not intractable, without status migrainosus: Secondary | ICD-10-CM | POA: Diagnosis not present

## 2012-07-09 DIAGNOSIS — H18519 Endothelial corneal dystrophy, unspecified eye: Secondary | ICD-10-CM | POA: Diagnosis not present

## 2012-07-09 DIAGNOSIS — H3530 Unspecified macular degeneration: Secondary | ICD-10-CM | POA: Diagnosis not present

## 2012-07-09 DIAGNOSIS — G2581 Restless legs syndrome: Secondary | ICD-10-CM | POA: Diagnosis not present

## 2012-07-10 DIAGNOSIS — H18519 Endothelial corneal dystrophy, unspecified eye: Secondary | ICD-10-CM | POA: Diagnosis not present

## 2012-07-10 DIAGNOSIS — G4733 Obstructive sleep apnea (adult) (pediatric): Secondary | ICD-10-CM | POA: Diagnosis not present

## 2012-07-10 DIAGNOSIS — K219 Gastro-esophageal reflux disease without esophagitis: Secondary | ICD-10-CM | POA: Diagnosis not present

## 2012-07-10 DIAGNOSIS — G2581 Restless legs syndrome: Secondary | ICD-10-CM | POA: Diagnosis not present

## 2012-07-10 DIAGNOSIS — G43909 Migraine, unspecified, not intractable, without status migrainosus: Secondary | ICD-10-CM | POA: Diagnosis not present

## 2012-07-10 DIAGNOSIS — H3530 Unspecified macular degeneration: Secondary | ICD-10-CM | POA: Diagnosis not present

## 2012-07-16 DIAGNOSIS — T8690 Unspecified complication of unspecified transplanted organ and tissue: Secondary | ICD-10-CM | POA: Diagnosis not present

## 2012-07-16 DIAGNOSIS — H3530 Unspecified macular degeneration: Secondary | ICD-10-CM | POA: Diagnosis not present

## 2012-07-16 DIAGNOSIS — T85398A Other mechanical complication of other ocular prosthetic devices, implants and grafts, initial encounter: Secondary | ICD-10-CM | POA: Diagnosis not present

## 2012-07-16 DIAGNOSIS — G43909 Migraine, unspecified, not intractable, without status migrainosus: Secondary | ICD-10-CM | POA: Diagnosis not present

## 2012-07-16 DIAGNOSIS — H18519 Endothelial corneal dystrophy, unspecified eye: Secondary | ICD-10-CM | POA: Diagnosis not present

## 2012-07-16 DIAGNOSIS — Z8673 Personal history of transient ischemic attack (TIA), and cerebral infarction without residual deficits: Secondary | ICD-10-CM | POA: Diagnosis not present

## 2012-07-16 DIAGNOSIS — G2581 Restless legs syndrome: Secondary | ICD-10-CM | POA: Diagnosis not present

## 2012-07-16 DIAGNOSIS — T85698A Other mechanical complication of other specified internal prosthetic devices, implants and grafts, initial encounter: Secondary | ICD-10-CM | POA: Diagnosis not present

## 2012-07-16 DIAGNOSIS — G4733 Obstructive sleep apnea (adult) (pediatric): Secondary | ICD-10-CM | POA: Diagnosis not present

## 2012-07-16 DIAGNOSIS — Z961 Presence of intraocular lens: Secondary | ICD-10-CM | POA: Diagnosis not present

## 2012-07-16 DIAGNOSIS — K219 Gastro-esophageal reflux disease without esophagitis: Secondary | ICD-10-CM | POA: Diagnosis not present

## 2012-07-17 DIAGNOSIS — G4733 Obstructive sleep apnea (adult) (pediatric): Secondary | ICD-10-CM | POA: Diagnosis not present

## 2012-07-17 DIAGNOSIS — H3530 Unspecified macular degeneration: Secondary | ICD-10-CM | POA: Diagnosis not present

## 2012-07-17 DIAGNOSIS — T85698A Other mechanical complication of other specified internal prosthetic devices, implants and grafts, initial encounter: Secondary | ICD-10-CM | POA: Diagnosis not present

## 2012-07-17 DIAGNOSIS — G43909 Migraine, unspecified, not intractable, without status migrainosus: Secondary | ICD-10-CM | POA: Diagnosis not present

## 2012-07-17 DIAGNOSIS — K219 Gastro-esophageal reflux disease without esophagitis: Secondary | ICD-10-CM | POA: Diagnosis not present

## 2012-07-17 DIAGNOSIS — H18519 Endothelial corneal dystrophy, unspecified eye: Secondary | ICD-10-CM | POA: Diagnosis not present

## 2012-10-01 ENCOUNTER — Other Ambulatory Visit: Payer: Self-pay | Admitting: Family Medicine

## 2012-10-01 MED ORDER — PRAMIPEXOLE DIHYDROCHLORIDE 0.5 MG PO TABS: 0.5000 mg | ORAL_TABLET | Freq: Every day | ORAL | Status: DC

## 2012-10-01 NOTE — Telephone Encounter (Signed)
Pt needs new script for pramipexole (MIRAPEX) 0.5 MG tablet Sent to [facility] supply (Not  [facility] in [place])

## 2012-10-21 DIAGNOSIS — H353190 Nonexudative age-related macular degeneration, unspecified eye, stage unspecified: Secondary | ICD-10-CM | POA: Diagnosis not present

## 2012-10-21 DIAGNOSIS — H02839 Dermatochalasis of unspecified eye, unspecified eyelid: Secondary | ICD-10-CM | POA: Diagnosis not present

## 2012-10-21 DIAGNOSIS — H18519 Endothelial corneal dystrophy, unspecified eye: Secondary | ICD-10-CM | POA: Diagnosis not present

## 2012-10-21 DIAGNOSIS — Z961 Presence of intraocular lens: Secondary | ICD-10-CM | POA: Diagnosis not present

## 2012-12-02 ENCOUNTER — Other Ambulatory Visit: Payer: Self-pay | Admitting: *Deleted

## 2012-12-02 MED ORDER — CLOPIDOGREL BISULFATE 75 MG PO TABS: 75.0000 mg | ORAL_TABLET | Freq: Every day | ORAL | Status: DC

## 2012-12-25 DIAGNOSIS — Z961 Presence of intraocular lens: Secondary | ICD-10-CM | POA: Diagnosis not present

## 2012-12-25 DIAGNOSIS — H18519 Endothelial corneal dystrophy, unspecified eye: Secondary | ICD-10-CM | POA: Diagnosis not present

## 2012-12-25 DIAGNOSIS — H02839 Dermatochalasis of unspecified eye, unspecified eyelid: Secondary | ICD-10-CM | POA: Diagnosis not present

## 2012-12-28 DIAGNOSIS — L821 Other seborrheic keratosis: Secondary | ICD-10-CM | POA: Diagnosis not present

## 2012-12-28 DIAGNOSIS — D692 Other nonthrombocytopenic purpura: Secondary | ICD-10-CM | POA: Diagnosis not present

## 2012-12-28 DIAGNOSIS — H61009 Unspecified perichondritis of external ear, unspecified ear: Secondary | ICD-10-CM | POA: Diagnosis not present

## 2012-12-28 DIAGNOSIS — D1801 Hemangioma of skin and subcutaneous tissue: Secondary | ICD-10-CM | POA: Diagnosis not present

## 2012-12-28 DIAGNOSIS — Z85828 Personal history of other malignant neoplasm of skin: Secondary | ICD-10-CM | POA: Diagnosis not present

## 2012-12-31 DIAGNOSIS — H02409 Unspecified ptosis of unspecified eyelid: Secondary | ICD-10-CM | POA: Diagnosis not present

## 2012-12-31 DIAGNOSIS — H18519 Endothelial corneal dystrophy, unspecified eye: Secondary | ICD-10-CM | POA: Diagnosis not present

## 2012-12-31 DIAGNOSIS — H0230 Blepharochalasis unspecified eye, unspecified eyelid: Secondary | ICD-10-CM | POA: Diagnosis not present

## 2012-12-31 DIAGNOSIS — Z961 Presence of intraocular lens: Secondary | ICD-10-CM | POA: Diagnosis not present

## 2013-01-08 ENCOUNTER — Telehealth: Payer: Self-pay | Admitting: Family Medicine

## 2013-01-08 NOTE — Telephone Encounter (Signed)
Calling from doctors office regarding pt's plavix, and surgery. She would like clearance to take her off of her medication, prior to her surgery. Please assist.

## 2013-01-10 NOTE — Telephone Encounter (Signed)
What type of surgery and how long are they asking to take her off?

## 2013-01-11 ENCOUNTER — Encounter: Payer: Self-pay | Admitting: Family Medicine

## 2013-01-11 ENCOUNTER — Ambulatory Visit (INDEPENDENT_AMBULATORY_CARE_PROVIDER_SITE_OTHER): Payer: Medicare Other | Admitting: Family Medicine

## 2013-01-11 VITALS — BP 132/62 | Temp 98.2°F | Wt 160.0 lb

## 2013-01-11 DIAGNOSIS — Z8679 Personal history of other diseases of the circulatory system: Secondary | ICD-10-CM

## 2013-01-11 DIAGNOSIS — K219 Gastro-esophageal reflux disease without esophagitis: Secondary | ICD-10-CM | POA: Diagnosis not present

## 2013-01-11 DIAGNOSIS — Z8669 Personal history of other diseases of the nervous system and sense organs: Secondary | ICD-10-CM

## 2013-01-11 DIAGNOSIS — G4700 Insomnia, unspecified: Secondary | ICD-10-CM

## 2013-01-11 MED ORDER — LANSOPRAZOLE 30 MG PO CPDR: 30.0000 mg | DELAYED_RELEASE_CAPSULE | ORAL | Status: DC | PRN

## 2013-01-11 MED ORDER — FIORINAL 50-325-40 MG PO CAPS: 1.0000 | ORAL_CAPSULE | Freq: Two times a day (BID) | ORAL | Status: DC | PRN

## 2013-01-11 NOTE — Telephone Encounter (Signed)
Please assist

## 2013-01-11 NOTE — Telephone Encounter (Signed)
Message left on pt home VM to call [facility].  I also called Dr Ophelia Charter office.  I was told a Dois Davenport does not work there and nothing was on pt chart to indicate she was having any surgery?

## 2013-01-11 NOTE — Patient Instructions (Addendum)
Stop aspirin at least 3-4 days prior to procedure  Insomnia Insomnia is frequent trouble falling and/or staying asleep. Insomnia can be a long term problem or a short term problem. Both are common. Insomnia can be a short term problem when the wakefulness is related to a certain stress or worry. Long term insomnia is often related to ongoing stress during waking hours and/or poor sleeping habits. Overtime, sleep deprivation itself can make the problem worse. Every little thing feels more severe because you are overtired and your ability to cope is decreased. CAUSES   Stress, anxiety, and depression.  Poor sleeping habits.  Distractions such as TV in the bedroom.  Naps close to bedtime.  Engaging in emotionally charged conversations before bed.  Technical reading before sleep.  Alcohol and other sedatives. They may make the problem worse. They can hurt normal sleep patterns and normal dream activity.  Stimulants such as caffeine for several hours prior to bedtime.  Pain syndromes and shortness of breath can cause insomnia.  Exercise late at night.  Changing time zones may cause sleeping problems (jet lag). It is sometimes helpful to have someone observe your sleeping patterns. They should look for periods of not breathing during the night (sleep apnea). They should also look to see how long those periods last. If you live alone or observers are uncertain, you can also be observed at a sleep clinic where your sleep patterns will be professionally monitored. Sleep apnea requires a checkup and treatment. Give your caregivers your medical history. Give your caregivers observations your family has made about your sleep.  SYMPTOMS   Not feeling rested in the morning.  Anxiety and restlessness at bedtime.  Difficulty falling and staying asleep. TREATMENT   Your caregiver may prescribe treatment for an underlying medical disorders. Your caregiver can give advice or help if you are using  alcohol or other drugs for self-medication. Treatment of underlying problems will usually eliminate insomnia problems.  Medications can be prescribed for short time use. They are generally not recommended for lengthy use.  Over-the-counter sleep medicines are not recommended for lengthy use. They can be habit forming.  You can promote easier sleeping by making lifestyle changes such as:  Using relaxation techniques that help with breathing and reduce muscle tension.  Exercising earlier in the day.  Changing your diet and the time of your last meal. No night time snacks.  Establish a regular time to go to bed.  Counseling can help with stressful problems and worry.  Soothing music and white noise may be helpful if there are background noises you cannot remove.  Stop tedious detailed work at least one hour before bedtime. HOME CARE INSTRUCTIONS   Keep a diary. Inform your caregiver about your progress. This includes any medication side effects. See your caregiver regularly. Take note of:  Times when you are asleep.  Times when you are awake during the night.  The quality of your sleep.  How you feel the next day. This information will help your caregiver care for you.  Get out of bed if you are still awake after 15 minutes. Read or do some quiet activity. Keep the lights down. Wait until you feel sleepy and go back to bed.  Keep regular sleeping and waking hours. Avoid naps.  Exercise regularly.  Avoid distractions at bedtime. Distractions include watching television or engaging in any intense or detailed activity like attempting to balance the household checkbook.  Develop a bedtime ritual. Keep a familiar routine of  bathing, brushing your teeth, climbing into bed at the same time each night, listening to soothing music. Routines increase the success of falling to sleep faster.  Use relaxation techniques. This can be using breathing and muscle tension release routines. It  can also include visualizing peaceful scenes. You can also help control troubling or intruding thoughts by keeping your mind occupied with boring or repetitive thoughts like the old concept of counting sheep. You can make it more creative like imagining planting one beautiful flower after another in your backyard garden.  During your day, work to eliminate stress. When this is not possible use some of the previous suggestions to help reduce the anxiety that accompanies stressful situations. MAKE SURE YOU:   Understand these instructions.  Will watch your condition.  Will get help right away if you are not doing well or get worse. Document Released: 08/30/2000 Document Revised: 11/25/2011 Document Reviewed: 09/30/2007 [facility] Patient Information 2013 [place], [place].

## 2013-01-12 DIAGNOSIS — Z8669 Personal history of other diseases of the nervous system and sense organs: Secondary | ICD-10-CM | POA: Insufficient documentation

## 2013-01-12 NOTE — Progress Notes (Signed)
Subjective:    Patient ID: Briana Fitzgerald, female    DOB: 03-27-1932, 79 y.o.   MRN: [id number]  HPI Patient is here for multiple issues as follows:  She has upcoming eyelid surgery. This is being done secondary to droopy eyelids. She has been taking Plavix secondary to prior history of TIAs but took herself off one month ago after left arm hematoma following injury hiking. Since then she's been taking aspirin 325 mg daily. No recent TIA symptoms. No chest pains. No cardiac history.  Long history of GERD. She takes Prevacid. She's tried tapering off but has had breakthrough symptoms. Requesting refills. No recent dysphagia. No appetite or weight changes.  Patient complains of some intermittent insomnia. No consistent alcohol use. No late day use of caffeine. Denies specific stressors.  Patient reports past history of migraine headaches. Headaches are typical of previous migraines. For years she has taken intermittent Fiorinal which seems to work. She is requesting refill today. She is aware of cautions and warnings given her age.  Past Medical History  Diagnosis Date  . BACK PAIN 07/18/2008  . SACROILIAC JOINT DYSFUNCTION 06/16/2008  . LAMINECTOMY, LUMBAR, HX OF 06/16/2008  . RESTLESS LEG SYNDROME 10/02/2010  . STYE 10/02/2010  . GERD 10/02/2010  . CONSTIPATION 10/02/2010  . OSTEOARTHRITIS 10/02/2010  . TRANSIENT ISCHEMIC ATTACKS, HX OF 10/02/2010   Past Surgical History  Procedure Laterality Date  . Appendectomy  1941  . Abdominal hysterectomy  1979    TAH, fibroids  . Liver biopsy  1997    benign, hemangioma resection  . Eye surgery  2005    catarac,both eyes  . Apogee / perigee repair  2005  . Spine surgery  2006    laminectomy    reports that she has never smoked. She has never used smokeless tobacco. She reports that  drinks alcohol. Her drug history is not on file. family history includes Cancer in her paternal aunt; Diabetes in her father; Pancreatitis in her brother; and  Stroke in her father. No Known Allergies    Review of Systems  Constitutional: Negative for appetite change, fatigue and unexpected weight change.  Eyes: Negative for visual disturbance.  Respiratory: Negative for cough, chest tightness, shortness of breath and wheezing.   Cardiovascular: Negative for chest pain, palpitations and leg swelling.  Gastrointestinal: Negative for abdominal pain.  Neurological: Negative for dizziness, seizures, syncope, weakness and light-headedness.  Psychiatric/Behavioral: Positive for sleep disturbance. Negative for dysphoric mood.       Objective:   Physical Exam  Constitutional: She is oriented to person, place, and time. She appears well-developed and well-nourished. No distress.  HENT:  Mouth/Throat: Oropharynx is clear and moist.  Cardiovascular: Normal rate and regular rhythm.   Pulmonary/Chest: Effort normal and breath sounds normal. No respiratory distress. She has no wheezes. She has no rales.  Musculoskeletal: She exhibits no edema.  Neurological: She is alert and oriented to person, place, and time. No cranial nerve deficit.  Psychiatric: She has a normal mood and affect. Her behavior is normal.          Assessment & Plan:  #1 history of GERD. Controlled. Refill Prevacid and discussed adequate calcium and vitamin D supplementation #2 insomnia. Sleep hygiene discussed. Avoid regular use of sedative hypnotics. Handout given. #3 preoperative clearance for eyelid surgery as above. Discuss risk associated with discontinuation of aspirin. She is aware of slight increased risk of TIA and stroke following discontinuation. She will stop for 5 days prior to surgery then resumed  following surgery. #4 history of migraine headaches. Not a candidate for tryptans with history of TIAs. She uses Fiorinal infrequently and we gave 1 refill only

## 2013-03-03 DIAGNOSIS — K219 Gastro-esophageal reflux disease without esophagitis: Secondary | ICD-10-CM | POA: Diagnosis not present

## 2013-03-03 DIAGNOSIS — G4733 Obstructive sleep apnea (adult) (pediatric): Secondary | ICD-10-CM | POA: Diagnosis not present

## 2013-03-03 DIAGNOSIS — G2581 Restless legs syndrome: Secondary | ICD-10-CM | POA: Diagnosis not present

## 2013-03-03 DIAGNOSIS — Z8673 Personal history of transient ischemic attack (TIA), and cerebral infarction without residual deficits: Secondary | ICD-10-CM | POA: Diagnosis not present

## 2013-03-03 DIAGNOSIS — H02409 Unspecified ptosis of unspecified eyelid: Secondary | ICD-10-CM | POA: Diagnosis not present

## 2013-03-03 DIAGNOSIS — Z7982 Long term (current) use of aspirin: Secondary | ICD-10-CM | POA: Diagnosis not present

## 2013-03-03 DIAGNOSIS — Z9849 Cataract extraction status, unspecified eye: Secondary | ICD-10-CM | POA: Diagnosis not present

## 2013-03-08 DIAGNOSIS — Z4889 Encounter for other specified surgical aftercare: Secondary | ICD-10-CM | POA: Diagnosis not present

## 2013-03-08 DIAGNOSIS — Z9889 Other specified postprocedural states: Secondary | ICD-10-CM | POA: Diagnosis not present

## 2013-03-24 DIAGNOSIS — H18519 Endothelial corneal dystrophy, unspecified eye: Secondary | ICD-10-CM | POA: Diagnosis not present

## 2013-03-24 DIAGNOSIS — Z961 Presence of intraocular lens: Secondary | ICD-10-CM | POA: Diagnosis not present

## 2013-03-24 DIAGNOSIS — H02839 Dermatochalasis of unspecified eye, unspecified eyelid: Secondary | ICD-10-CM | POA: Diagnosis not present

## 2013-04-01 DIAGNOSIS — H02409 Unspecified ptosis of unspecified eyelid: Secondary | ICD-10-CM | POA: Diagnosis not present

## 2013-04-01 DIAGNOSIS — Z4889 Encounter for other specified surgical aftercare: Secondary | ICD-10-CM | POA: Diagnosis not present

## 2013-06-14 DIAGNOSIS — H18519 Endothelial corneal dystrophy, unspecified eye: Secondary | ICD-10-CM | POA: Diagnosis not present

## 2013-06-17 DIAGNOSIS — G4733 Obstructive sleep apnea (adult) (pediatric): Secondary | ICD-10-CM | POA: Diagnosis not present

## 2013-06-17 DIAGNOSIS — H18519 Endothelial corneal dystrophy, unspecified eye: Secondary | ICD-10-CM | POA: Diagnosis not present

## 2013-06-17 DIAGNOSIS — K219 Gastro-esophageal reflux disease without esophagitis: Secondary | ICD-10-CM | POA: Diagnosis not present

## 2013-06-18 DIAGNOSIS — G4733 Obstructive sleep apnea (adult) (pediatric): Secondary | ICD-10-CM | POA: Diagnosis not present

## 2013-06-18 DIAGNOSIS — H18519 Endothelial corneal dystrophy, unspecified eye: Secondary | ICD-10-CM | POA: Diagnosis not present

## 2013-06-18 DIAGNOSIS — K219 Gastro-esophageal reflux disease without esophagitis: Secondary | ICD-10-CM | POA: Diagnosis not present

## 2013-06-21 DIAGNOSIS — H44829 Luxation of globe, unspecified eye: Secondary | ICD-10-CM | POA: Diagnosis not present

## 2013-06-21 DIAGNOSIS — T85698A Other mechanical complication of other specified internal prosthetic devices, implants and grafts, initial encounter: Secondary | ICD-10-CM | POA: Diagnosis not present

## 2013-06-21 DIAGNOSIS — Z7982 Long term (current) use of aspirin: Secondary | ICD-10-CM | POA: Diagnosis not present

## 2013-06-22 DIAGNOSIS — Z7982 Long term (current) use of aspirin: Secondary | ICD-10-CM | POA: Diagnosis not present

## 2013-06-22 DIAGNOSIS — H44829 Luxation of globe, unspecified eye: Secondary | ICD-10-CM | POA: Diagnosis not present

## 2013-07-01 ENCOUNTER — Other Ambulatory Visit: Payer: Self-pay | Admitting: Family Medicine

## 2013-07-16 DIAGNOSIS — Z23 Encounter for immunization: Secondary | ICD-10-CM | POA: Diagnosis not present

## 2013-10-29 DIAGNOSIS — Z947 Corneal transplant status: Secondary | ICD-10-CM | POA: Diagnosis not present

## 2013-10-29 DIAGNOSIS — Z961 Presence of intraocular lens: Secondary | ICD-10-CM | POA: Diagnosis not present

## 2013-10-29 DIAGNOSIS — H2640 Unspecified secondary cataract: Secondary | ICD-10-CM | POA: Diagnosis not present

## 2013-11-26 DIAGNOSIS — H2640 Unspecified secondary cataract: Secondary | ICD-10-CM | POA: Diagnosis not present

## 2013-12-13 ENCOUNTER — Other Ambulatory Visit: Payer: Self-pay | Admitting: Family Medicine

## 2013-12-14 NOTE — Telephone Encounter (Signed)
Last visit 01/11/13 Last refill 01/11/13 #30 0 refill

## 2013-12-14 NOTE — Telephone Encounter (Signed)
Refill once 

## 2014-01-03 ENCOUNTER — Other Ambulatory Visit: Payer: Self-pay | Admitting: Family Medicine

## 2014-01-28 ENCOUNTER — Ambulatory Visit (INDEPENDENT_AMBULATORY_CARE_PROVIDER_SITE_OTHER): Payer: Medicare Other | Admitting: Family Medicine

## 2014-01-28 ENCOUNTER — Encounter: Payer: Self-pay | Admitting: Family Medicine

## 2014-01-28 VITALS — BP 128/70 | HR 73 | Temp 97.9°F | Ht 65.0 in | Wt 150.0 lb

## 2014-01-28 DIAGNOSIS — G2581 Restless legs syndrome: Secondary | ICD-10-CM | POA: Diagnosis not present

## 2014-01-28 DIAGNOSIS — K921 Melena: Secondary | ICD-10-CM | POA: Diagnosis not present

## 2014-01-28 DIAGNOSIS — E785 Hyperlipidemia, unspecified: Secondary | ICD-10-CM

## 2014-01-28 DIAGNOSIS — K5900 Constipation, unspecified: Secondary | ICD-10-CM | POA: Diagnosis not present

## 2014-01-28 DIAGNOSIS — K219 Gastro-esophageal reflux disease without esophagitis: Secondary | ICD-10-CM | POA: Diagnosis not present

## 2014-01-28 DIAGNOSIS — Z0000 Encounter for general adult medical examination without abnormal findings: Secondary | ICD-10-CM | POA: Diagnosis not present

## 2014-01-28 DIAGNOSIS — Z23 Encounter for immunization: Secondary | ICD-10-CM | POA: Diagnosis not present

## 2014-01-28 LAB — BASIC METABOLIC PANEL
BUN: 15 mg/dL (ref 6–23)
CO2: 27 mEq/L (ref 19–32)
Calcium: 10 mg/dL (ref 8.4–10.5)
Chloride: 107 mEq/L (ref 96–112)
Creatinine, Ser: 0.9 mg/dL (ref 0.4–1.2)
GFR: 60.89 mL/min (ref >60.00)
Glucose, Bld: 93 mg/dL (ref 70–99)
Potassium: 4 mEq/L (ref 3.5–5.1)
Sodium: 141 mEq/L (ref 135–145)

## 2014-01-28 LAB — HEPATIC FUNCTION PANEL
ALT: 14 U/L (ref 0–35)
AST: 22 U/L (ref 0–37)
Albumin: 3.8 g/dL (ref 3.5–5.2)
Alkaline Phosphatase: 71 U/L (ref 39–117)
Bilirubin, Direct: 0.1 mg/dL (ref 0.0–0.3)
Total Bilirubin: 1.1 mg/dL (ref 0.2–1.2)
Total Protein: 6.8 g/dL (ref 6.0–8.3)

## 2014-01-28 LAB — CBC WITH DIFFERENTIAL/PLATELET
Basophils Absolute: 0 10*3/uL (ref 0.0–0.1)
Basophils Relative: 0.4 % (ref 0.0–3.0)
Eosinophils Absolute: 0.1 10*3/uL (ref 0.0–0.7)
Eosinophils Relative: 1.6 % (ref 0.0–5.0)
HCT: 42.5 % (ref 36.0–46.0)
Hemoglobin: 14.1 g/dL (ref 12.0–15.0)
Lymphocytes Relative: 61.5 % — ABNORMAL HIGH (ref 12.0–46.0)
Lymphs Abs: 5.5 10*3/uL — ABNORMAL HIGH (ref 0.7–4.0)
MCHC: 33.1 g/dL (ref 30.0–36.0)
MCV: 83.9 fl (ref 78.0–100.0)
Monocytes Absolute: 0.4 10*3/uL (ref 0.1–1.0)
Monocytes Relative: 4 % (ref 3.0–12.0)
Neutro Abs: 2.9 10*3/uL (ref 1.4–7.7)
Neutrophils Relative %: 32.5 % — ABNORMAL LOW (ref 43.0–77.0)
Platelets: 150 10*3/uL (ref 150.0–400.0)
RBC: 5.06 Mil/uL (ref 3.87–5.11)
RDW: 15.1 % (ref 11.5–15.5)
WBC: 8.9 10*3/uL (ref 4.0–10.5)

## 2014-01-28 LAB — LIPID PANEL
Cholesterol: 138 mg/dL (ref 0–200)
HDL: 36.1 mg/dL — ABNORMAL LOW (ref >39.00)
LDL Cholesterol: 87 mg/dL (ref 0–99)
Total CHOL/HDL Ratio: 4
Triglycerides: 76 mg/dL (ref 0.0–149.0)
VLDL: 15.2 mg/dL (ref 0.0–40.0)

## 2014-01-28 LAB — TSH: TSH: 0.45 u[IU]/mL (ref 0.35–4.50)

## 2014-01-28 NOTE — Patient Instructions (Signed)

## 2014-01-28 NOTE — Progress Notes (Signed)
Pre visit review using our clinic review tool, if applicable. No additional management support is needed unless otherwise documented below in the visit note. 

## 2014-01-28 NOTE — Progress Notes (Signed)
Subjective:    Patient ID: Briana Fitzgerald, female    DOB: Jul 20, 1932, 80 y.o.   MRN: [id number]  HPI Patient here for Medicare wellness exam and medical followup. Her chronic problems include history of hyperlipidemia, osteoporosis, intermittent constipation, GERD, restless leg syndrome. She remains on Mirapex for restless leg syndrome and the symptoms are stable. She took herself off of simvastatin apparently several months ago because of some possible GI symptoms. She had some chronic intermittent constipation. Recently had occasional bright red blood per rectum. No pain with bowel movements. No appetite or weight change.  Patient reports colonoscopy over 10 years ago when she lived in [place]. Takes occasional Prevacid for GERD symptoms but takes inconsistently. Check calcium and vitamin D supplement.  Chronic hearing loss. She lost one of her hearing aids. No recent falls.  Past Medical History  Diagnosis Date  . BACK PAIN 07/18/2008  . SACROILIAC JOINT DYSFUNCTION 06/16/2008  . LAMINECTOMY, LUMBAR, HX OF 06/16/2008  . RESTLESS LEG SYNDROME 10/02/2010  . STYE 10/02/2010  . GERD 10/02/2010  . CONSTIPATION 10/02/2010  . OSTEOARTHRITIS 10/02/2010  . TRANSIENT ISCHEMIC ATTACKS, HX OF 10/02/2010   Past Surgical History  Procedure Laterality Date  . Appendectomy  1941  . Abdominal hysterectomy  1979    TAH, fibroids  . Liver biopsy  1997    benign, hemangioma resection  . Eye surgery  2005    catarac,both eyes  . Apogee / perigee repair  2005  . Spine surgery  2006    laminectomy    reports that she has never smoked. She has never used smokeless tobacco. She reports that she drinks alcohol. Her drug history is not on file. family history includes Cancer in her paternal aunt; Diabetes in her father; Pancreatitis in her brother; Stroke in her father. No Known Allergies  1.  Risk factors based on Past Medical , Social, and Family history reviewed and as above. 2.  Limitations in  physical activities no falls.   3.  Depression/mood mood stable,. 4.  Hearing chronic loss. Has hearing aids, though recently lost one aid. 5.  ADLs independent 6.  Cognitive function (orientation to time and place, language, writing, speech,memory) no memory deficit.  Language and judgement intact. 7.  Home Safety no issues. 8.  Height, weight, and visual acuity. All stable. 9.  Counseling weight bearing exercise and adequate calcium and Vit D. 10. Recommendation of preventive services. Prevnar 13 and yearly flu vaccine. 11. Labs based on risk factors cbc, TSH, lipid, BMP 12. Care Plan as above.    Review of Systems  Constitutional: Negative for fever, activity change, appetite change, fatigue and unexpected weight change.  HENT: Positive for hearing loss. Negative for ear pain, sore throat and trouble swallowing.   Eyes: Negative for visual disturbance.  Respiratory: Negative for cough and shortness of breath.   Cardiovascular: Negative for chest pain and palpitations.  Gastrointestinal: Positive for blood in stool (Intermittently). Negative for abdominal pain, diarrhea, constipation and rectal pain.  Endocrine: Negative for polydipsia and polyuria.  Genitourinary: Negative for dysuria and hematuria.  Musculoskeletal: Negative for arthralgias, back pain and myalgias.  Skin: Negative for rash.  Neurological: Negative for dizziness, syncope and headaches.  Hematological: Negative for adenopathy.  Psychiatric/Behavioral: Negative for confusion and dysphoric mood.       Objective:   Physical Exam  Constitutional: She is oriented to person, place, and time. She appears well-developed and well-nourished.  HENT:  Head: Normocephalic and atraumatic.  Eyes: EOM  are normal. Pupils are equal, round, and reactive to light.  Neck: Normal range of motion. Neck supple. No thyromegaly present.  Cardiovascular: Normal rate, regular rhythm and normal heart sounds.   No murmur  heard. Pulmonary/Chest: Breath sounds normal. No respiratory distress. She has no wheezes. She has no rales.  Abdominal: Soft. Bowel sounds are normal. She exhibits no distension and no mass. There is no tenderness. There is no rebound and no guarding.  Genitourinary:  No anal fissure. She has some very small external hemorrhoids. Digital exam no mass. Hemoccult negative  Musculoskeletal: Normal range of motion. She exhibits no edema.  Lymphadenopathy:    She has no cervical adenopathy.  Neurological: She is alert and oriented to person, place, and time. She displays normal reflexes. No cranial nerve deficit.  Skin: No rash noted.  Psychiatric: She has a normal mood and affect. Her behavior is normal. Judgment and thought content normal.          Assessment & Plan:  #1 health maintenance. Prevnar 13 vaccine given. Continue yearly flu vaccine. #2 hyperlipidemia. Patient requesting repeat lipids. She has not been compliant with statin therapy. #3 GERD which is stable and she intermittently takes proton pump inhibitor #4 restless leg syndrome stable on Mirapex #5 chronic intermittent constipation.  Check TSH. Discussed measures to reduce #6 intermittent hematochezia. Probably related to intermittent hemorrhoid bleed. No visible anal fissure. Patient is interested in discussing pros and cons of repeat colonoscopy with GI. Even though she is 67, she is generally very healthy. Her last colonoscopy was reported over 10 years ago. Check CBC

## 2014-03-07 DIAGNOSIS — Z947 Corneal transplant status: Secondary | ICD-10-CM | POA: Diagnosis not present

## 2014-03-07 DIAGNOSIS — Z961 Presence of intraocular lens: Secondary | ICD-10-CM | POA: Diagnosis not present

## 2014-03-09 ENCOUNTER — Encounter: Payer: Self-pay | Admitting: Family Medicine

## 2014-03-28 DIAGNOSIS — Z947 Corneal transplant status: Secondary | ICD-10-CM | POA: Diagnosis not present

## 2014-03-28 DIAGNOSIS — H40019 Open angle with borderline findings, low risk, unspecified eye: Secondary | ICD-10-CM | POA: Diagnosis not present

## 2014-03-30 ENCOUNTER — Other Ambulatory Visit: Payer: Self-pay | Admitting: Family Medicine

## 2014-04-13 ENCOUNTER — Telehealth: Payer: Self-pay | Admitting: Family Medicine

## 2014-04-13 DIAGNOSIS — G4733 Obstructive sleep apnea (adult) (pediatric): Secondary | ICD-10-CM | POA: Insufficient documentation

## 2014-04-13 NOTE — Telephone Encounter (Signed)
Can you call patient to come in for appointment to for sleep study

## 2014-04-13 NOTE — Telephone Encounter (Signed)
OK to send   If she has not had sleep study in that long probably needs follow up sleep study to gage appropriate level of  CPAP.

## 2014-04-13 NOTE — Telephone Encounter (Signed)
Pt states her cpap machine is 78 yrs old and it has broken. Pt is elible for a new machine. Pt needs an order for new cpap sent to [facility] [id number]   Phone [phone] Pt needs the whole thing!

## 2014-04-15 NOTE — Telephone Encounter (Signed)
Pt called back about her request for a new cpap machine. I did let her know that [facility] said she needed an appt but pt feels she does not need one according to the people that handles the cpap machine.

## 2014-04-25 NOTE — Telephone Encounter (Signed)
Pt would like md to call her today

## 2014-04-25 NOTE — Telephone Encounter (Signed)
Pt informed because of insurance protocol that she would need to be seen by [place] or a referral to pulmonary. Pt just wants a order sent to [facility]. Pt would like for you to give her a call.

## 2014-04-26 NOTE — Telephone Encounter (Signed)
Faxed Rx to [facility] °

## 2014-04-26 NOTE — Telephone Encounter (Signed)
We can try to send order but she needs to know they [employer]) may require new study.

## 2014-06-07 DIAGNOSIS — Z23 Encounter for immunization: Secondary | ICD-10-CM | POA: Diagnosis not present

## 2014-06-28 ENCOUNTER — Other Ambulatory Visit: Payer: Self-pay | Admitting: Family Medicine

## 2014-07-06 DIAGNOSIS — Z947 Corneal transplant status: Secondary | ICD-10-CM | POA: Diagnosis not present

## 2014-07-06 DIAGNOSIS — Z961 Presence of intraocular lens: Secondary | ICD-10-CM | POA: Diagnosis not present

## 2014-07-06 DIAGNOSIS — H02836 Dermatochalasis of left eye, unspecified eyelid: Secondary | ICD-10-CM | POA: Diagnosis not present

## 2014-08-08 ENCOUNTER — Telehealth: Payer: Self-pay | Admitting: Family Medicine

## 2014-08-08 NOTE — Telephone Encounter (Signed)
Wanted to let you know that she did get her cpap and you should be getting the paperwork to sign.

## 2014-08-08 NOTE — Telephone Encounter (Signed)
Noted  

## 2014-09-01 ENCOUNTER — Encounter: Payer: Self-pay | Admitting: Family Medicine

## 2014-09-01 ENCOUNTER — Ambulatory Visit (INDEPENDENT_AMBULATORY_CARE_PROVIDER_SITE_OTHER): Payer: Medicare Other | Admitting: Family Medicine

## 2014-09-01 VITALS — BP 126/78 | HR 83 | Temp 98.1°F | Wt 145.0 lb

## 2014-09-01 DIAGNOSIS — J0110 Acute frontal sinusitis, unspecified: Secondary | ICD-10-CM | POA: Diagnosis not present

## 2014-09-01 MED ORDER — AMOXICILLIN-POT CLAVULANATE 875-125 MG PO TABS: 1.0000 | ORAL_TABLET | Freq: Two times a day (BID) | ORAL | Status: DC

## 2014-09-01 NOTE — Progress Notes (Signed)
   Subjective:    Patient ID: Briana Fitzgerald, female    DOB: Jan 24, 1932, 80 y.o.   MRN: [id number]  HPI Acute visit. Patient seen with three-week history of cough productive of green sputum, by frontal sinus pressure, intermittent headache, malaise, greenish nasal discharge. She initially felt this started as a cold but symptoms are not improved. She has tried some topical heat. Has not tried any nasal irrigation. She's had prior history of severe sinus infections in the past though none recently.  Past Medical History  Diagnosis Date  . BACK PAIN 07/18/2008  . SACROILIAC JOINT DYSFUNCTION 06/16/2008  . LAMINECTOMY, LUMBAR, HX OF 06/16/2008  . RESTLESS LEG SYNDROME 10/02/2010  . STYE 10/02/2010  . GERD 10/02/2010  . CONSTIPATION 10/02/2010  . OSTEOARTHRITIS 10/02/2010  . TRANSIENT ISCHEMIC ATTACKS, HX OF 10/02/2010   Past Surgical History  Procedure Laterality Date  . Appendectomy  1941  . Abdominal hysterectomy  1979    TAH, fibroids  . Liver biopsy  1997    benign, hemangioma resection  . Eye surgery  2005    catarac,both eyes  . Apogee / perigee repair  2005  . Spine surgery  2006    laminectomy    reports that she has never smoked. She has never used smokeless tobacco. She reports that she drinks alcohol. Her drug history is not on file. family history includes Cancer in her paternal aunt; Diabetes in her father; Pancreatitis in her brother; Stroke in her father. No Known Allergies    Review of Systems  Constitutional: Positive for fatigue. Negative for fever and chills.  HENT: Positive for congestion and sinus pressure.   Respiratory: Positive for cough.   Neurological: Positive for headaches.       Objective:   Physical Exam  Constitutional: She appears well-developed and well-nourished.  HENT:  Right Ear: External ear normal.  Left Ear: External ear normal.  Mouth/Throat: Oropharynx is clear and moist.  Nasal mucosa slightly erythematous. No visible polyps  Neck:  Neck supple.  Cardiovascular: Normal rate and regular rhythm.   Pulmonary/Chest: Effort normal and breath sounds normal. No respiratory distress. She has no wheezes. She has no rales.  Lymphadenopathy:    She has no cervical adenopathy.          Assessment & Plan:  Acute sinusitis. Given duration of symptoms start Augmentin 875 mg twice daily for 10 days. We suggest she consider nasal saline irrigation with distilled water and Nettie pot

## 2014-09-01 NOTE — Progress Notes (Signed)
Pre visit review using our clinic review tool, if applicable. No additional management support is needed unless otherwise documented below in the visit note. 

## 2014-09-01 NOTE — Patient Instructions (Signed)

## 2014-10-19 DIAGNOSIS — R0982 Postnasal drip: Secondary | ICD-10-CM | POA: Diagnosis not present

## 2014-10-19 DIAGNOSIS — R0981 Nasal congestion: Secondary | ICD-10-CM | POA: Diagnosis not present

## 2014-10-19 DIAGNOSIS — G4733 Obstructive sleep apnea (adult) (pediatric): Secondary | ICD-10-CM | POA: Diagnosis not present

## 2014-10-22 ENCOUNTER — Other Ambulatory Visit: Payer: Self-pay | Admitting: Family Medicine

## 2014-10-24 DIAGNOSIS — R0981 Nasal congestion: Secondary | ICD-10-CM | POA: Diagnosis not present

## 2015-01-02 ENCOUNTER — Other Ambulatory Visit: Payer: Self-pay | Admitting: Family Medicine

## 2015-01-13 ENCOUNTER — Encounter: Payer: Self-pay | Admitting: Family Medicine

## 2015-01-13 ENCOUNTER — Ambulatory Visit (INDEPENDENT_AMBULATORY_CARE_PROVIDER_SITE_OTHER): Payer: Medicare Other | Admitting: Family Medicine

## 2015-01-13 VITALS — BP 130/68 | HR 86 | Temp 97.7°F | Wt 141.0 lb

## 2015-01-13 DIAGNOSIS — L301 Dyshidrosis [pompholyx]: Secondary | ICD-10-CM | POA: Diagnosis not present

## 2015-01-13 DIAGNOSIS — M25551 Pain in right hip: Secondary | ICD-10-CM | POA: Diagnosis not present

## 2015-01-13 DIAGNOSIS — M5416 Radiculopathy, lumbar region: Secondary | ICD-10-CM

## 2015-01-13 DIAGNOSIS — M5417 Radiculopathy, lumbosacral region: Secondary | ICD-10-CM | POA: Diagnosis not present

## 2015-01-13 MED ORDER — DESOXIMETASONE 0.25 % EX CREA: 1.0000 "application " | TOPICAL_CREAM | Freq: Two times a day (BID) | CUTANEOUS | Status: DC

## 2015-01-13 NOTE — Progress Notes (Signed)
Subjective:    Patient ID: Briana Fitzgerald, female    DOB: [birth year], 81 y.o.   MRN: [id number]  HPI  Patient seen with right hip and lumbar pain over the past few weeks. She apparently had L3-L4, L4-L5 fusion back in 2006. She denies recent injury. Her pain is centered around her right hip somewhat but also right lumbar area. She has a deep achy pain that radiates into her right buttock and down her knee at times. She has some pain with ambulation but also sometimes periods of sitting. 6 out of 10 severity. No recent injury. No weakness or numbness. No urine or stool incontinence. No fevers or chills. She's tried over-the-counter medications with minimal relief. She is reluctant to take stronger medications.  Separate problem of pruritic fine vesicular rash on the sides of her fingers which comes and goes. After small blisters appear leg dry up. She's not tried anything for this.  Past Medical History  Diagnosis Date  . BACK PAIN 07/18/2008  . SACROILIAC JOINT DYSFUNCTION 06/16/2008  . LAMINECTOMY, LUMBAR, HX OF 06/16/2008  . RESTLESS LEG SYNDROME 10/02/2010  . STYE 10/02/2010  . GERD 10/02/2010  . CONSTIPATION 10/02/2010  . OSTEOARTHRITIS 10/02/2010  . TRANSIENT ISCHEMIC ATTACKS, HX OF 10/02/2010   Past Surgical History  Procedure Laterality Date  . Appendectomy  1941  . Abdominal hysterectomy  1979    TAH, fibroids  . Liver biopsy  1997    benign, hemangioma resection  . Eye surgery  2005    catarac,both eyes  . Apogee / perigee repair  2005  . Spine surgery  2006    laminectomy    reports that she has never smoked. She has never used smokeless tobacco. She reports that she drinks alcohol. Her drug history is not on file. family history includes Cancer in her paternal aunt; Diabetes in her father; Pancreatitis in her brother; Stroke in her father. No Known Allergies   Review of Systems  Constitutional: Negative for fever, chills, appetite change and unexpected weight change.    Cardiovascular: Negative for chest pain.  Gastrointestinal: Negative for abdominal pain.  Genitourinary: Negative for dysuria and flank pain.  Musculoskeletal: Positive for back pain.  Skin: Positive for rash.  Neurological: Negative for weakness and numbness.       Objective:   Physical Exam  Constitutional: She appears well-developed and well-nourished.  Cardiovascular: Normal rate and regular rhythm.   Pulmonary/Chest: Effort normal and breath sounds normal. No respiratory distress. She has no wheezes. She has no rales.  Musculoskeletal:  Straight leg raise negative. Good range of motion left hip. She has some stiffness with rotation of the right. No lateral hip tenderness. No right lower extremity edema.  Neurological:  Deep tendon reflexes symmetric right and left knee and ankle bilaterally. Full-strength with plantar flexion, dorsiflexion, and knee extension on the right. No sensory impairment.  Skin: Rash noted.  Fine raised very small vesicles lateral aspect left middle finger. No pustules. Nontender          Assessment & Plan:  #1 patient presents with right hip pain and right lumbar back pain with some radiculitis symptoms. Suspect this is most likely lumbar radiculitis but cannot rule out osteoarthritis changes right hip. Will obtain x-rays right hip and lumbar spine. Consider orthopedic referral. She does not have any focal neurologic deficits at this time #2 skin rash. Suspect dyshidrotic eczema. Topicort 0.25% cream twice daily as needed-she is instructed to use no more than 2 weeks continuously

## 2015-01-13 NOTE — Progress Notes (Signed)
Pre visit review using our clinic review tool, if applicable. No additional management support is needed unless otherwise documented below in the visit note. 

## 2015-01-17 ENCOUNTER — Other Ambulatory Visit: Payer: Self-pay | Admitting: Family Medicine

## 2015-01-17 ENCOUNTER — Ambulatory Visit (INDEPENDENT_AMBULATORY_CARE_PROVIDER_SITE_OTHER)
Admission: RE | Admit: 2015-01-17 | Discharge: 2015-01-17 | Disposition: A | Discharge status: Home / Self Care | Payer: Medicare Other | Source: Ambulatory Visit | Attending: Family Medicine | Admitting: Family Medicine

## 2015-01-17 DIAGNOSIS — M5416 Radiculopathy, lumbar region: Secondary | ICD-10-CM

## 2015-01-17 DIAGNOSIS — M25551 Pain in right hip: Secondary | ICD-10-CM

## 2015-01-17 DIAGNOSIS — M5417 Radiculopathy, lumbosacral region: Secondary | ICD-10-CM | POA: Diagnosis not present

## 2015-01-17 DIAGNOSIS — M1611 Unilateral primary osteoarthritis, right hip: Secondary | ICD-10-CM | POA: Diagnosis not present

## 2015-01-17 DIAGNOSIS — M5136 Other intervertebral disc degeneration, lumbar region: Secondary | ICD-10-CM | POA: Diagnosis not present

## 2015-01-23 ENCOUNTER — Other Ambulatory Visit: Payer: Self-pay | Admitting: Family Medicine

## 2015-01-23 DIAGNOSIS — M5416 Radiculopathy, lumbar region: Secondary | ICD-10-CM

## 2015-02-07 ENCOUNTER — Telehealth: Payer: Self-pay | Admitting: Family Medicine

## 2015-02-07 DIAGNOSIS — M545 Low back pain: Secondary | ICD-10-CM | POA: Diagnosis not present

## 2015-02-07 NOTE — Telephone Encounter (Signed)
Pt needs order fax [phone] to [facility] for bone density test per pt last test was 2012 at [facility]. Pt has an appt with on 02-14-15

## 2015-02-08 NOTE — Telephone Encounter (Signed)
OK to order 

## 2015-02-08 NOTE — Telephone Encounter (Signed)
Faxed order to [facility]

## 2015-02-09 DIAGNOSIS — M545 Low back pain: Secondary | ICD-10-CM | POA: Diagnosis not present

## 2015-02-14 DIAGNOSIS — M810 Age-related osteoporosis without current pathological fracture: Secondary | ICD-10-CM | POA: Diagnosis not present

## 2015-02-20 DIAGNOSIS — M545 Low back pain: Secondary | ICD-10-CM | POA: Diagnosis not present

## 2015-02-22 DIAGNOSIS — M545 Low back pain: Secondary | ICD-10-CM | POA: Diagnosis not present

## 2015-02-23 DIAGNOSIS — M545 Low back pain: Secondary | ICD-10-CM | POA: Diagnosis not present

## 2015-02-27 DIAGNOSIS — M545 Low back pain: Secondary | ICD-10-CM | POA: Diagnosis not present

## 2015-03-01 ENCOUNTER — Encounter: Payer: Self-pay | Admitting: Family Medicine

## 2015-03-01 DIAGNOSIS — M545 Low back pain: Secondary | ICD-10-CM | POA: Diagnosis not present

## 2015-03-03 DIAGNOSIS — M545 Low back pain: Secondary | ICD-10-CM | POA: Diagnosis not present

## 2015-03-06 DIAGNOSIS — M545 Low back pain: Secondary | ICD-10-CM | POA: Diagnosis not present

## 2015-03-09 DIAGNOSIS — M545 Low back pain: Secondary | ICD-10-CM | POA: Diagnosis not present

## 2015-03-10 DIAGNOSIS — M545 Low back pain: Secondary | ICD-10-CM | POA: Diagnosis not present

## 2015-03-14 DIAGNOSIS — M545 Low back pain: Secondary | ICD-10-CM | POA: Diagnosis not present

## 2015-03-21 DIAGNOSIS — M545 Low back pain: Secondary | ICD-10-CM | POA: Diagnosis not present

## 2015-03-21 DIAGNOSIS — M5136 Other intervertebral disc degeneration, lumbar region: Secondary | ICD-10-CM | POA: Diagnosis not present

## 2015-03-22 DIAGNOSIS — M545 Low back pain: Secondary | ICD-10-CM | POA: Diagnosis not present

## 2015-03-29 DIAGNOSIS — M545 Low back pain: Secondary | ICD-10-CM | POA: Diagnosis not present

## 2015-04-13 DIAGNOSIS — M545 Low back pain: Secondary | ICD-10-CM | POA: Diagnosis not present

## 2015-06-12 ENCOUNTER — Other Ambulatory Visit: Payer: Self-pay | Admitting: Family Medicine

## 2015-06-30 DIAGNOSIS — Z23 Encounter for immunization: Secondary | ICD-10-CM | POA: Diagnosis not present

## 2015-08-04 DIAGNOSIS — H1851 Endothelial corneal dystrophy: Secondary | ICD-10-CM | POA: Diagnosis not present

## 2015-08-04 DIAGNOSIS — Z947 Corneal transplant status: Secondary | ICD-10-CM | POA: Diagnosis not present

## 2015-08-04 DIAGNOSIS — Z961 Presence of intraocular lens: Secondary | ICD-10-CM | POA: Diagnosis not present

## 2015-08-04 DIAGNOSIS — H353190 Nonexudative age-related macular degeneration, unspecified eye, stage unspecified: Secondary | ICD-10-CM | POA: Diagnosis not present

## 2015-12-15 ENCOUNTER — Other Ambulatory Visit: Payer: Self-pay | Admitting: Family Medicine

## 2016-02-07 DIAGNOSIS — H40013 Open angle with borderline findings, low risk, bilateral: Secondary | ICD-10-CM | POA: Diagnosis not present

## 2016-02-19 ENCOUNTER — Telehealth: Payer: Self-pay | Admitting: Family Medicine

## 2016-02-19 ENCOUNTER — Ambulatory Visit (INDEPENDENT_AMBULATORY_CARE_PROVIDER_SITE_OTHER): Payer: Medicare Other | Admitting: Family Medicine

## 2016-02-19 VITALS — BP 120/60 | HR 82 | Temp 98.6°F | Ht 65.0 in | Wt 141.9 lb

## 2016-02-19 DIAGNOSIS — K5900 Constipation, unspecified: Secondary | ICD-10-CM

## 2016-02-19 MED ORDER — BUTALBITAL-ASPIRIN-CAFFEINE 50-325-40 MG PO CAPS: 1.0000 | ORAL_CAPSULE | Freq: Four times a day (QID) | ORAL | Status: DC | PRN

## 2016-02-19 NOTE — Telephone Encounter (Signed)
FYI: Pt on your schedule today

## 2016-02-19 NOTE — Progress Notes (Signed)
Pre visit review using our clinic review tool, if applicable. No additional management support is needed unless otherwise documented below in the visit note. 

## 2016-02-19 NOTE — Telephone Encounter (Signed)
Patient Name: Briana Fitzgerald DOB: 05-Jun-1932 Initial Comment Caller states Sat had BM after laxative; 3 were normal, but 4th was green; also happened last week before this; Nurse Assessment Nurse: Vallery Sa, RN, [place] Date/Time (Eastern Time): 02/19/2016 10:21:40 AM Confirm and document reason for call. If symptomatic, describe symptoms. You must click the next button to save text entered. ---Caller states she took a laxative 3 days ago and she had a green stool 2 days ago. No constipation or diarrhea today. No fever. No blood in her stool. No abdominal pain. Alert and responsive. Has the patient traveled out of the country within the last 30 days? ---No Does the patient have any new or worsening symptoms? ---Yes Will a triage be completed? ---Yes Related visit to physician within the last 2 weeks? ---No Does the PT have any chronic conditions? (i.e. diabetes, asthma, etc.) ---Yes List chronic conditions. ---Restless Leg Syndrome, Chronic back problems Is this a behavioral health or substance abuse call? ---No Guidelines Guideline Title Affirmed Question Affirmed Notes Stools - Unusual Color Green stools (all triage questions negative) Weakness (Generalized) and Fatigue [1] MODERATE weakness (i.e., interferes with work, school, normal activities) AND [2] persists > 3 days Final Disposition User See Physician within [place], Therapist, sports, [place]. Scheduled for 3:30pm appointment today with Dr. Elease Hashimoto.  Comments Caller states she developed increased weakness several months ago. Referrals REFERRED TO PCP OFFICE Disagree/Comply: Comply

## 2016-02-19 NOTE — Progress Notes (Signed)
   Subjective:    Patient ID: Briana Fitzgerald, female    DOB: 21-Nov-1931, 82 y.o.   MRN: [id number]  HPI Patient had some recent constipation issues. She took a stimulant laxative over the weekend and comes in today because she was concerned because she had a green colored stool. She had about 3 bowel movements Saturday and 1 small volume bowel movements since then. She had some abdominal distention prior to taking the laxative. She has tried to eat more prunes and fiber recently. Walks for exercise fairly regularly. Previous thyroid functions have been normal. Otherwise no change in stool habits. No appetite or weight changes. Does not take any significant anticholinergic drugs  Past Medical History  Diagnosis Date  . BACK PAIN 07/18/2008  . SACROILIAC JOINT DYSFUNCTION 06/16/2008  . LAMINECTOMY, LUMBAR, HX OF 06/16/2008  . RESTLESS LEG SYNDROME 10/02/2010  . STYE 10/02/2010  . GERD 10/02/2010  . CONSTIPATION 10/02/2010  . OSTEOARTHRITIS 10/02/2010  . TRANSIENT ISCHEMIC ATTACKS, HX OF 10/02/2010   Past Surgical History  Procedure Laterality Date  . Appendectomy  1941  . Abdominal hysterectomy  1979    TAH, fibroids  . Liver biopsy  1997    benign, hemangioma resection  . Eye surgery  2005    catarac,both eyes  . Apogee / perigee repair  2005  . Spine surgery  2006    laminectomy    reports that she has never smoked. She has never used smokeless tobacco. She reports that she drinks alcohol. Her drug history is not on file. family history includes Cancer in her paternal aunt; Diabetes in her father; Pancreatitis in her brother; Stroke in her father. No Known Allergies    Review of Systems  Constitutional: Negative for fever, chills, appetite change and unexpected weight change.  Respiratory: Negative for shortness of breath.   Cardiovascular: Negative for chest pain.  Gastrointestinal: Positive for constipation. Negative for nausea, vomiting, diarrhea and blood in stool.         Objective:   Physical Exam  Constitutional: She appears well-developed and well-nourished. No distress.  Cardiovascular: Normal rate and regular rhythm.   Pulmonary/Chest: Effort normal and breath sounds normal. No respiratory distress. She has no wheezes. She has no rales.  Abdominal: Soft. She exhibits no distension and no mass. There is no tenderness. There is no rebound and no guarding.          Assessment & Plan:  Constipation. We've recommended avoiding stimulant laxatives. We've recommended instead as needed MiraLAX. Handout on constipation given. Recommend at least 25 g of fiber per day and increased fluid and intake. Set up wellness exam.  Consider repeat TSH then.   Eulas Post MD [facility]

## 2016-02-19 NOTE — Patient Instructions (Signed)
Constipation, Adult Constipation is when a person has fewer than three bowel movements a week, has difficulty having a bowel movement, or has stools that are dry, hard, or larger than normal. As people grow older, constipation is more common. A low-fiber diet, not taking in enough fluids, and taking certain medicines may make constipation worse.  CAUSES   Certain medicines, such as antidepressants, pain medicine, iron supplements, antacids, and water pills.   Certain diseases, such as diabetes, irritable bowel syndrome (IBS), thyroid disease, or depression.   Not drinking enough water.   Not eating enough fiber-rich foods.   Stress or travel.   Lack of physical activity or exercise.   Ignoring the urge to have a bowel movement.   Using laxatives too much.  SIGNS AND SYMPTOMS   Having fewer than three bowel movements a week.   Straining to have a bowel movement.   Having stools that are hard, dry, or larger than normal.   Feeling full or bloated.   Pain in the lower abdomen.   Not feeling relief after having a bowel movement.  DIAGNOSIS  Your health care provider will take a medical history and perform a physical exam. Further testing may be done for severe constipation. Some tests may include:  A barium enema X-ray to examine your rectum, colon, and, sometimes, your small intestine.   A sigmoidoscopy to examine your lower colon.   A colonoscopy to examine your entire colon. TREATMENT  Treatment will depend on the severity of your constipation and what is causing it. Some dietary treatments include drinking more fluids and eating more fiber-rich foods. Lifestyle treatments may include regular exercise. If these diet and lifestyle recommendations do not help, your health care provider may recommend taking over-the-counter laxative medicines to help you have bowel movements. Prescription medicines may be prescribed if over-the-counter medicines do not work.   HOME CARE INSTRUCTIONS   Eat foods that have a lot of fiber, such as fruits, vegetables, whole grains, and beans.  Limit foods high in fat and processed sugars, such as french fries, hamburgers, cookies, candies, and soda.   A fiber supplement may be added to your diet if you cannot get enough fiber from foods.   Drink enough fluids to keep your urine clear or pale yellow.   Exercise regularly or as directed by your health care provider.   Go to the restroom when yo  u have the urge to go. Do not hold it.   Only take over-the-counter or prescription medicines as directed by your health care provider. Do not take other medicines for constipation without talking to your health care provider first.  [place] IF:   You have bright red blood in your stool.   Your constipation lasts for more than 4 days or gets worse.   You have abdominal or rectal pain.   You have thin, pencil-like stools.   You have unexplained weight loss. MAKE SURE YOU:   Understand these instructions.  Will watch your condition.  Will get help right away if you are not doing well or get worse.   This information is not intended to replace advice given to you by your health care provider. Make sure you discuss any questions you have with your health care provider.   Document Released: 05/31/2004 Document Revised: 09/23/2014 Document Reviewed: 06/14/2013  AVOID REGULAR USE OF STIMULANT LAXATIVES USE MIRALAX AS NEEDED FOR CONSTIPATION. Chartered certified accountant Patient Education [employer].

## 2016-03-11 ENCOUNTER — Ambulatory Visit (INDEPENDENT_AMBULATORY_CARE_PROVIDER_SITE_OTHER): Payer: Medicare Other | Admitting: Family Medicine

## 2016-03-11 ENCOUNTER — Encounter: Payer: Self-pay | Admitting: Family Medicine

## 2016-03-11 ENCOUNTER — Telehealth: Payer: Self-pay | Admitting: Family Medicine

## 2016-03-11 VITALS — BP 120/60 | HR 74 | Temp 98.1°F | Ht 64.0 in | Wt 140.0 lb

## 2016-03-11 DIAGNOSIS — Z0000 Encounter for general adult medical examination without abnormal findings: Secondary | ICD-10-CM | POA: Diagnosis not present

## 2016-03-11 DIAGNOSIS — M810 Age-related osteoporosis without current pathological fracture: Secondary | ICD-10-CM

## 2016-03-11 DIAGNOSIS — R5383 Other fatigue: Secondary | ICD-10-CM

## 2016-03-11 DIAGNOSIS — R42 Dizziness and giddiness: Secondary | ICD-10-CM | POA: Diagnosis not present

## 2016-03-11 DIAGNOSIS — G2581 Restless legs syndrome: Secondary | ICD-10-CM

## 2016-03-11 DIAGNOSIS — D72829 Elevated white blood cell count, unspecified: Secondary | ICD-10-CM

## 2016-03-11 LAB — BASIC METABOLIC PANEL
BUN: 13 mg/dL (ref 6–23)
CO2: 26 mEq/L (ref 19–32)
Calcium: 9.9 mg/dL (ref 8.4–10.5)
Chloride: 107 mEq/L (ref 96–112)
Creatinine, Ser: 0.83 mg/dL (ref 0.40–1.20)
GFR: 69.93 mL/min (ref >60.00)
Glucose, Bld: 95 mg/dL (ref 70–99)
Potassium: 3.9 mEq/L (ref 3.5–5.1)
Sodium: 140 mEq/L (ref 135–145)

## 2016-03-11 LAB — CBC WITH DIFFERENTIAL/PLATELET
Basophils Absolute: 0 10*3/uL (ref 0.0–0.1)
Basophils Relative: 0.2 % (ref 0.0–3.0)
Eosinophils Absolute: 0.1 10*3/uL (ref 0.0–0.7)
Eosinophils Relative: 0.7 % (ref 0.0–5.0)
HCT: 37.7 % (ref 36.0–46.0)
Hemoglobin: 12.4 g/dL (ref 12.0–15.0)
Lymphocytes Relative: 73.1 % — ABNORMAL HIGH (ref 12.0–46.0)
Lymphs Abs: 9.6 10*3/uL — ABNORMAL HIGH (ref 0.7–4.0)
MCHC: 32.8 g/dL (ref 30.0–36.0)
MCV: 79.8 fl (ref 78.0–100.0)
Monocytes Absolute: 0.5 10*3/uL (ref 0.1–1.0)
Monocytes Relative: 3.7 % (ref 3.0–12.0)
Neutro Abs: 2.9 10*3/uL (ref 1.4–7.7)
Neutrophils Relative %: 22.3 % — ABNORMAL LOW (ref 43.0–77.0)
Platelets: 115 10*3/uL — ABNORMAL LOW (ref 150.0–400.0)
RBC: 4.73 Mil/uL (ref 3.87–5.11)
RDW: 16.1 % — ABNORMAL HIGH (ref 11.5–15.5)
WBC: 13.2 10*3/uL — ABNORMAL HIGH (ref 4.0–10.5)

## 2016-03-11 LAB — TSH: TSH: 2.14 u[IU]/mL (ref 0.35–4.50)

## 2016-03-11 MED ORDER — LANSOPRAZOLE 30 MG PO CPDR: 30.0000 mg | DELAYED_RELEASE_CAPSULE | Freq: Every day | ORAL | Status: DC

## 2016-03-11 MED ORDER — ALENDRONATE SODIUM 70 MG PO TABS: 70.0000 mg | ORAL_TABLET | ORAL | Status: DC

## 2016-03-11 NOTE — Patient Instructions (Addendum)
Health Maintenance  Topic Date Due  . TETANUS/TDAP  01/17/1953  . INFLUENZA VACCINE  04/16/2016  . DEXA SCAN  Completed  . ZOSTAVAX  Completed  . PNA vac Low Risk Adult  Completed   Consider repeat mammogram this year. Start the Fosamax- be sure to take on an empty stomach with a full glass of water and remain upright for at least 30 minutes after taking.

## 2016-03-11 NOTE — Progress Notes (Addendum)
Subjective:    Patient ID: Briana Fitzgerald, female    DOB: May 21, 1932, 82 y.o.   MRN: [id number]  HPI Here for Medicare wellness visit and medical follow-up Generally stays fairly active. She had one fall during the past year but this was while she was walking on a trail and tripped over a root. She has generally not had any balance problems. She's had remote history of TIA. She has some mild scoliosis. Restless leg syndrome. Osteoporosis by DEXA scan last year. She takes calcium and vitamin D. She does have some mild GERD symptoms but infrequently takes Prevacid and these have generally been well controlled. She has some chronic constipation. Rare migraine headaches.  She and her husband have advanced directives in place with living will and they have power of attorney.  Medications reviewed. Restless leg symptoms have been stable and controlled. She thinks she had mammogram last year we have no record. She's been getting mammograms every other year. Immunizations are up-to-date with exception of tetanus. She has not had any major fractures. Has had some gradual loss of height.  Past Medical History  Diagnosis Date  . BACK PAIN 07/18/2008  . SACROILIAC JOINT DYSFUNCTION 06/16/2008  . LAMINECTOMY, LUMBAR, HX OF 06/16/2008  . RESTLESS LEG SYNDROME 10/02/2010  . STYE 10/02/2010  . GERD 10/02/2010  . CONSTIPATION 10/02/2010  . OSTEOARTHRITIS 10/02/2010  . TRANSIENT ISCHEMIC ATTACKS, HX OF 10/02/2010   Past Surgical History  Procedure Laterality Date  . Appendectomy  1941  . Abdominal hysterectomy  1979    TAH, fibroids  . Liver biopsy  1997    benign, hemangioma resection  . Eye surgery  2005    catarac,both eyes  . Apogee / perigee repair  2005  . Spine surgery  2006    laminectomy    reports that she has never smoked. She has never used smokeless tobacco. She reports that she drinks alcohol. Her drug history is not on file. family history includes Cancer in her paternal aunt;  Diabetes in her father; Pancreatitis in her brother; Stroke in her father. No Known Allergies  1.  Risk factors based on Past Medical , Social, and Family history reviewed and as indicated above with no changes 2.  Limitations in physical activities None.  No recent falls (other than one above- tripped on root walking in woods). 3.  Depression/mood No active depression or anxiety issues 4.  Hearing No defiits 5.  ADLs independent in all. 6.  Cognitive function (orientation to time and place, language, writing, speech,memory) no short or long term memory issues.  Language and judgement intact. 7.  Home Safety no issues 8.  Height, weight, and visual acuity. Some mild gradual loss of height.  Weight and vision stable. 9.  Counseling discussed continue regular calcium and vitamin D daily. We discussed medication regarding her osteoporosis. 10. Recommendation of preventive services. Continue yearly flu vaccine. Confirm a she's had recent mammogram and if none last year she will set this up. Repeat DEXA scan next year. 11. Labs based on risk factors CBC, basic metabolic panel, TSH 12. Care Plan-as above 13. Other Providers- no other regular providers. 14. Written schedule of screening/prevention services given to patient.    Review of Systems  Constitutional: Negative for fever, activity change, appetite change, fatigue and unexpected weight change.  HENT: Negative for ear pain, hearing loss, sore throat and trouble swallowing.   Eyes: Negative for visual disturbance.  Respiratory: Negative for cough and shortness of breath.  Cardiovascular: Negative for chest pain and palpitations.  Gastrointestinal: Negative for abdominal pain, diarrhea, constipation and blood in stool.  Genitourinary: Negative for dysuria and hematuria.  Musculoskeletal: Positive for arthralgias. Negative for myalgias and back pain.  Skin: Negative for rash.  Neurological: Negative for dizziness, syncope and headaches.    Hematological: Negative for adenopathy.  Psychiatric/Behavioral: Negative for confusion and dysphoric mood.       Objective:   Physical Exam  Constitutional: She is oriented to person, place, and time. She appears well-developed and well-nourished.  HENT:  Head: Normocephalic and atraumatic.  Eyes: EOM are normal. Pupils are equal, round, and reactive to light.  Neck: Normal range of motion. Neck supple. No thyromegaly present.  Cardiovascular: Normal rate, regular rhythm and normal heart sounds.   No murmur heard. Pulmonary/Chest: Breath sounds normal. No respiratory distress. She has no wheezes. She has no rales.  Abdominal: Soft. Bowel sounds are normal. She exhibits no distension and no mass. There is no tenderness. There is no rebound and no guarding.  Musculoskeletal: Normal range of motion. She exhibits no edema.  Lymphadenopathy:    She has no cervical adenopathy.  Neurological: She is alert and oriented to person, place, and time. She displays normal reflexes. No cranial nerve deficit.  Skin: No rash noted.  Psychiatric: She has a normal mood and affect. Her behavior is normal. Judgment and thought content normal.          Assessment & Plan:  #1 Medicare subsequent annual wellness visit. Confirm she had mammogram last year. If not, she will schedule. Repeat DEXA scan by next year.  Continue yearly flu vaccination  #2 osteoporosis. Continue daily calcium and vitamin D. We discussed options for treatment. She has 10 year risk of hip fracture 8.9% and 28.3% risk of major osteoporosis fracture. T score -3.5. Cautious trial Fosamax 70 mg weekly. We went over in detail how to take this appropriately. Stop for any severe reflux symptoms  #3 restless leg syndrome. Stable. Continue Mirapex  #4 nonspecific symptoms of lightheadedness and fatigue. Check TSH, basic metabolic panel, CBC. Increase nondiuretic fluids  Eulas Post MD [place] [facility] at  [facility]  Patient had slightly elevated white count of 13.2 thousand with atypical lymphocytes and smudge cells. Set up hematology referral. Rule out [place] MD [place] [facility]

## 2016-03-11 NOTE — Telephone Encounter (Signed)
Printed, place on desk.

## 2016-03-11 NOTE — Progress Notes (Signed)
Pre visit review using our clinic review tool, if applicable. No additional management support is needed unless otherwise documented below in the visit note. 

## 2016-03-11 NOTE — Telephone Encounter (Signed)
Pt results are in at the [facility], they would like Dr Elease Hashimoto to review the  CBC w/ diff and path report ASAP to advise. Please call [facility].

## 2016-03-12 NOTE — Addendum Note (Signed)
Addended by: Eulas Post on: 03/12/2016 08:14 AM   Modules accepted: Orders

## 2016-03-15 ENCOUNTER — Telehealth: Payer: Self-pay | Admitting: Oncology

## 2016-03-15 ENCOUNTER — Encounter: Payer: Self-pay | Admitting: Oncology

## 2016-03-15 NOTE — Telephone Encounter (Signed)
Pt confirmed appt and completed intake, patient letter mailed

## 2016-03-25 ENCOUNTER — Telehealth: Payer: Self-pay | Admitting: Family Medicine

## 2016-03-25 DIAGNOSIS — R195 Other fecal abnormalities: Secondary | ICD-10-CM

## 2016-03-25 DIAGNOSIS — K5909 Other constipation: Secondary | ICD-10-CM

## 2016-03-25 NOTE — Telephone Encounter (Signed)
Pt states she thinks the  alendronate (FOSAMAX) 70 MG tablet  is giving her constipation.  Pt is still  very constipated and would like dr burchette to address. Would like to speak with someone.  [facility]/ [address]

## 2016-03-25 NOTE — Telephone Encounter (Signed)
Pt reported worsening Constipation since 03/11/16 when she started this medication. Has been using miralax several times per day with minimal relief.

## 2016-03-26 NOTE — Telephone Encounter (Signed)
Left message for patient to call back  

## 2016-03-26 NOTE — Telephone Encounter (Signed)
Leave off the Fosamax for one month and then re-try (if her constipation is improved).  Plenty of fluids and continue Miralax as needed.  Office follow up if no BM in 5 days to r/o impaction.

## 2016-03-27 ENCOUNTER — Encounter: Payer: Self-pay | Admitting: Physician Assistant

## 2016-03-27 NOTE — Telephone Encounter (Signed)
Spoke with pt this morning. She demanded to only speak with Physician.

## 2016-03-27 NOTE — Addendum Note (Signed)
Addended by: Eulas Post on: 03/27/2016 10:47 AM   Modules accepted: Orders

## 2016-03-27 NOTE — Telephone Encounter (Signed)
Patient requested callback. Chronic constipation since the 1990s. Recently started Fosamax for osteoporosis and she thinks this worsen her symptoms. We instructed her to hold Fosamax for now. She was taking daily MiraLAX and also daily stool softener along with plenty of fluids and did not have a bowel movement for several days. She eventually took an enema and after second dose of enema over. A few days had a decent bowel movement this morning. She has recently noted change in the shape of her stools-pencillike. No bloody stools. Patient is requesting GI referral.

## 2016-04-04 ENCOUNTER — Telehealth: Payer: Self-pay | Admitting: Oncology

## 2016-04-04 ENCOUNTER — Ambulatory Visit (HOSPITAL_BASED_OUTPATIENT_CLINIC_OR_DEPARTMENT_OTHER): Payer: Medicare Other | Admitting: Oncology

## 2016-04-04 ENCOUNTER — Other Ambulatory Visit (HOSPITAL_BASED_OUTPATIENT_CLINIC_OR_DEPARTMENT_OTHER): Payer: Medicare Other

## 2016-04-04 ENCOUNTER — Other Ambulatory Visit (HOSPITAL_COMMUNITY)
Admission: RE | Admit: 2016-04-04 | Discharge: 2016-04-04 | Disposition: A | Discharge status: Home / Self Care | Payer: Medicare Other | Source: Ambulatory Visit | Attending: Oncology | Admitting: Oncology

## 2016-04-04 VITALS — BP 135/55 | HR 83 | Temp 98.1°F | Resp 17 | Ht 64.0 in | Wt 136.7 lb

## 2016-04-04 DIAGNOSIS — D696 Thrombocytopenia, unspecified: Secondary | ICD-10-CM | POA: Diagnosis not present

## 2016-04-04 DIAGNOSIS — C9110 Chronic lymphocytic leukemia of B-cell type not having achieved remission: Secondary | ICD-10-CM | POA: Diagnosis not present

## 2016-04-04 DIAGNOSIS — D72820 Lymphocytosis (symptomatic): Secondary | ICD-10-CM

## 2016-04-04 LAB — CBC WITH DIFFERENTIAL/PLATELET
BASO%: 1 % (ref 0.0–2.0)
Basophils Absolute: 0.1 10*3/uL (ref 0.0–0.1)
EOS%: 0.7 % (ref 0.0–7.0)
Eosinophils Absolute: 0.1 10*3/uL (ref 0.0–0.5)
HCT: 38 % (ref 34.8–46.6)
HGB: 12.4 g/dL (ref 11.6–15.9)
LYMPH%: 67.4 % — ABNORMAL HIGH (ref 14.0–49.7)
MCH: 25.4 pg (ref 25.1–34.0)
MCHC: 32.7 g/dL (ref 31.5–36.0)
MCV: 77.8 fL — ABNORMAL LOW (ref 79.5–101.0)
MONO#: 0.5 10*3/uL (ref 0.1–0.9)
MONO%: 4.5 % (ref 0.0–14.0)
NEUT#: 2.8 10*3/uL (ref 1.5–6.5)
NEUT%: 26.4 % — ABNORMAL LOW (ref 38.4–76.8)
Platelets: 113 10*3/uL — ABNORMAL LOW (ref 145–400)
RBC: 4.88 10*6/uL (ref 3.70–5.45)
RDW: 15.5 % — ABNORMAL HIGH (ref 11.2–14.5)
WBC: 10.5 10*3/uL — ABNORMAL HIGH (ref 3.9–10.3)
lymph#: 7.1 10*3/uL — ABNORMAL HIGH (ref 0.9–3.3)

## 2016-04-04 LAB — COMPREHENSIVE METABOLIC PANEL
ALT: 10 U/L (ref 0–55)
AST: 19 U/L (ref 5–34)
Albumin: 3.5 g/dL (ref 3.5–5.0)
Alkaline Phosphatase: 81 U/L (ref 40–150)
Anion Gap: 7 mEq/L (ref 3–11)
BUN: 14.9 mg/dL (ref 7.0–26.0)
CO2: 25 mEq/L (ref 22–29)
Calcium: 10 mg/dL (ref 8.4–10.4)
Chloride: 107 mEq/L (ref 98–109)
Creatinine: 0.9 mg/dL (ref 0.6–1.1)
EGFR: 59 mL/min/{1.73_m2} — ABNORMAL LOW (ref >90)
Glucose: 111 mg/dl (ref 70–140)
Potassium: 4.3 mEq/L (ref 3.5–5.1)
Sodium: 139 mEq/L (ref 136–145)
Total Bilirubin: 1.03 mg/dL (ref 0.20–1.20)
Total Protein: 7.2 g/dL (ref 6.4–8.3)

## 2016-04-04 LAB — TECHNOLOGIST REVIEW

## 2016-04-04 LAB — LACTATE DEHYDROGENASE: LDH: 226 U/L (ref 125–245)

## 2016-04-04 NOTE — Consult Note (Signed)
Reason for Referral: Lymphocytosis.   HPI: Pleasant 82 year old woman native of [place] and currently resides in this area since 2009. She is a pleasant woman with history of GERD and osteoarthritis as well as periodic constipation. She was noted to have leukocytosis on CBC in June 2017. Her white cell count was 13.2 with a lymphocytosis at 73%. Her peripheral smear showed atypical lymphocytes and smudge cells. Previous CBC showed mild lymphocytosis dating back to 2015. She is otherwise asymptomatic. She denies any fevers or chills or lymphadenopathy. He does report constipation and some fatigue at times. She does drive and attends to activities of daily living. She lives independently with her husband without any decline. He denies any recent infections or hospitalizations.  She does not report any headaches, blurry vision, syncope or seizures. She does not report any fevers or chills or sweats. He does not report any cough, wheezing or hemoptysis. She does not report any nausea, vomiting or abdominal pain. He does not report any frequency urgency or hesitancy. She does not report any skeletal complaints. Remaining review of systems unremarkable.   Past Medical History  Diagnosis Date  . BACK PAIN 07/18/2008  . SACROILIAC JOINT DYSFUNCTION 06/16/2008  . LAMINECTOMY, LUMBAR, HX OF 06/16/2008  . RESTLESS LEG SYNDROME 10/02/2010  . STYE 10/02/2010  . GERD 10/02/2010  . CONSTIPATION 10/02/2010  . OSTEOARTHRITIS 10/02/2010  . TRANSIENT ISCHEMIC ATTACKS, HX OF 10/02/2010  :  Past Surgical History  Procedure Laterality Date  . Appendectomy  1941  . Abdominal hysterectomy  1979    TAH, fibroids  . Liver biopsy  1997    benign, hemangioma resection  . Eye surgery  2005    catarac,both eyes  . [place]ogee / perigee repair  2005  . Spine surgery  2006    laminectomy  :   Current outpatient prescriptions:  .  alendronate (FOSAMAX) 70 MG tablet, Take 1 tablet (70 mg total) by mouth every 7 (seven) days.  Take with a full glass of water on an empty stomach., Disp: 4 tablet, Rfl: 11 .  butalbital-aspirin-caffeine (FIORINAL) 50-325-40 MG capsule, Take 1 capsule by mouth every 6 (six) hours as needed for headache., Disp: 30 capsule, Rfl: 0 .  calcium citrate-vitamin D 200-200 MG-UNIT TABS, Take 1 tablet by mouth daily., Disp: , Rfl:  .  fish oil-omega-3 fatty acids 1000 MG capsule, Take 2 g by mouth daily., Disp: , Rfl:  .  lansoprazole (PREVACID) 30 MG capsule, Take 1 capsule (30 mg total) by mouth daily at 12 noon., Disp: 60 capsule, Rfl: 3 .  Multiple Vitamins-Minerals (PRESERVISION AREDS PO), Take 1 tablet by mouth daily., Disp: , Rfl:  .  naproxen sodium (ANAPROX) 220 MG tablet, Take 220 mg by mouth as needed., Disp: , Rfl:  .  polyethylene glycol (MIRALAX / GLYCOLAX) packet, Take 17 g by mouth daily., Disp: , Rfl:  .  pramipexole (MIRAPEX) 0.5 MG tablet, TAKE 1 TABLET BY MOUTH AT BEDTIME, Disp: 90 tablet, Rfl: 1:  No Known Allergies:  Family History  Problem Relation Age of Onset  . Stroke Father   . Diabetes Father     type ll  . Pancreatitis Brother   . Cancer Paternal Aunt     aunt  :  Social History   Social History  . Marital Status: Married    Spouse Name: N/A  . Number of Children: N/A  . Years of Education: N/A   Occupational History  . Not on file.   Social History  Main Topics  . Smoking status: Never Smoker   . Smokeless tobacco: Never Used  . Alcohol Use: Yes     Comment: at dinner with friends, 2-3 per month - wine  . Drug Use: Not on file  . Sexual Activity: Not on file   Other Topics Concern  . Not on file   Social History Narrative  :  Pertinent items are noted in HPI.  Exam: Blood pressure 135/55, pulse 83, temperature 98.1 F (36.7 C), temperature source Oral, resp. rate 17, height 5' 4"  (1.626 m), weight 136 lb 11.2 oz (62.007 kg), SpO2 98 %. General appearance: alert and cooperative Head: Normocephalic, without obvious abnormality Throat:  lips, mucosa, and tongue normal; teeth and gums normal Neck: no adenopathy Back: negative Resp: clear to auscultation bilaterally Chest wall: no tenderness Cardio: regular rate and rhythm, S1, S2 normal, no murmur, click, rub or gallop GI: soft, non-tender; bowel sounds normal; no masses,  no organomegaly Extremities: extremities normal, atraumatic, no cyanosis or edema Pulses: 2+ and symmetric Skin: Skin color, texture, turgor normal. No rashes or lesions   CBC    Component Value Date/Time   WBC 10.5* 04/04/2016 1117   WBC 13.2* 03/11/2016 0942   RBC 4.88 04/04/2016 1117   RBC 4.73 03/11/2016 0942   HGB 12.4 04/04/2016 1117   HGB 12.4 03/11/2016 0942   HCT 38.0 04/04/2016 1117   HCT 37.7 03/11/2016 0942   PLT 113* 04/04/2016 1117   PLT 115.0* 03/11/2016 0942   MCV 77.8* 04/04/2016 1117   MCV 79.8 03/11/2016 0942   MCH 25.4 04/04/2016 1117   MCHC 32.7 04/04/2016 1117   MCHC 32.8 03/11/2016 0942   RDW 15.5* 04/04/2016 1117   RDW 16.1* 03/11/2016 0942   LYMPHSABS 7.1* 04/04/2016 1117   LYMPHSABS 9.6* 03/11/2016 0942   MONOABS 0.5 04/04/2016 1117   MONOABS 0.5 03/11/2016 0942   EOSABS 0.1 04/04/2016 1117   EOSABS 0.1 03/11/2016 0942   BASOSABS 0.1 04/04/2016 1117   BASOSABS 0.0 03/11/2016 0942      Assessment and Plan:   82 year old woman with the following issues:  1. Lymphocytosis noted since 2015. Her CBC from today was reviewed and showed a total white cell count of 10.5 with lymphocytosis. Her lymphocyte percentage 67% with increase absolute lymphocyte count of 7.1. She had a normal hemoglobin and mild thrombocytopenia.  The differential diagnosis was reviewed today and lymphoproliferative disorder is the likely cause. CLL is the most common condition noted in this patient population. Therefore blood flow cytometry is pending from today to confirm that diagnosis.  Assuming that we are dealing with CLL, the natural course of this disease was discussed with the  patient and her husband. She does not have any symptoms to suggest active or symptomatic CLL that requires treatment. Observation and surveillance is reasonable at this time and indications for treatment were reviewed. These would include bulky adenopathy, constitutional symptoms or worsening cytopenias.  Other conditions could include mantle cell lymphoma and other lymphomas that can manifest with peripheral blood lymphocytosis. Flow cytometry will determine that as well. Repeat imaging studies and a bone marrow biopsy will be indicated if other lymphomas are suspected.  2. The thrombocytopenia: Very mild and asymptomatic at this time. Could be related to her lymphoproliferative disorder or reactive in nature. We will continue to monitor.  3. Follow-up: Will be in 6 months sooner if needed to depending on the results of her flow cytometry.

## 2016-04-04 NOTE — Progress Notes (Signed)
Please see consult note.  

## 2016-04-04 NOTE — Telephone Encounter (Signed)
Gave pt cal & avs °

## 2016-04-05 LAB — FLOW CYTOMETRY

## 2016-04-08 ENCOUNTER — Encounter: Payer: Self-pay | Admitting: Physician Assistant

## 2016-04-08 ENCOUNTER — Ambulatory Visit (INDEPENDENT_AMBULATORY_CARE_PROVIDER_SITE_OTHER): Payer: Medicare Other | Admitting: Physician Assistant

## 2016-04-08 VITALS — BP 120/50 | HR 80 | Ht 64.0 in | Wt 136.4 lb

## 2016-04-08 DIAGNOSIS — R1032 Left lower quadrant pain: Secondary | ICD-10-CM

## 2016-04-08 DIAGNOSIS — R198 Other specified symptoms and signs involving the digestive system and abdomen: Secondary | ICD-10-CM

## 2016-04-08 NOTE — Patient Instructions (Signed)
For a miralax purge, take 1 dose of miralax, 17 grams in 8 0z of water or lemonade. Take a dose every 15 -30 minutes with a total of 6 doses.  Then daily, take 1 -1/2 dose of miralax.    You have been scheduled for a CT scan of the abdomen and pelvis at [facility] ([address]---this is in the same building as Press [facility]).   You are scheduled on Friday 04-12-2016  at 2:00 PM. You should arrive 15 minutes prior to your appointment time for registration. Please follow the written instructions below on the day of your exam:  WARNING: IF YOU ARE ALLERGIC TO IODINE/X-RAY DYE, PLEASE NOTIFY RADIOLOGY IMMEDIATELY AT [phone]! YOU WILL BE GIVEN A 13 HOUR PREMEDICATION PREP.  1) Do not eat or drink anything after 10:00 am(4 hours prior to your test) 2) You have been given 2 bottles of oral contrast to drink. The solution may taste  better if refrigerated, but do NOT add ice or any other liquid to this solution. Shake  well before drinking.    Drink 1 bottle of contrast @ 12:00 noon (2 hours prior to your exam)  Drink 1 bottle of contrast @ 1:00 PM (1 hour prior to your exam)  You may take any medications as prescribed with a small amount of water except for the following: Metformin, Glucophage, Glucovance, Avandamet, Riomet, Fortamet, Actoplus Met, Janumet, Glumetza or Metaglip. The above medications must be held the day of the exam AND 48 hours after the exam.  The purpose of you drinking the oral contrast is to aid in the visualization of your intestinal tract. The contrast solution may cause some diarrhea. Before your exam is started, you will be given a small amount of fluid to drink. Depending on your individual set of symptoms, you may also receive an intravenous injection of x-ray contrast/dye. Plan on being at [facility] for 30 minutes or long, depending on the type of exam you are having performed.  If you have any questions regarding your exam or if you need  to reschedule, you may call the CT department at [phone] between the hours of 8:00 am and 5:00 pm, Monday-Friday.  ________________________________________________________________________

## 2016-04-08 NOTE — Progress Notes (Signed)
Subjective:    Patient ID: Briana Fitzgerald, female    DOB: 1932/02/09, 82 y.o.   MRN: [id number]  HPI Briana Fitzgerald is a pleasant 82 year old white female, new to GI today referred by Dr. Carolann Fitzgerald for evaluation of lower abdominal discomfort and constipation. Patient states that she has "lifelong" history of constipation dating back many many years. In the past this seems relatively mild as she could manage it with high fiber diet etc. She had more recently been taking Benefiber on a regular basis which helped for a while and now over the past months has slowly been getting worse. She apparently had an episode a few weeks back where she didn't have a bowel movement for about 5 days and was miserable. She had been in to see Dr. Elease Fitzgerald and was started on MiraLAX on a daily basis which does seem to be helping though she still doesn't feel like she's passing an adequate amount of stool. She says that her stools are somewhat more narrow than in the past. She has not noticed any melena or hematochezia. Appetite has been fine, and weight has been stable. No fever or chills. Briana Fitzgerald She feels that Fosamax contributed to her most recent bout of severe constipation. Briana Fitzgerald Other medical problems include a remote surgery for what sounds like a cystocele, she also had a partial resection of her liver in 1997 for a benign tumor and is currently being followed by Dr. Bobbye Fitzgerald.for CLL. Patient has not had prior colonoscopy or EGD  Review of Systems Pertinent positive and negative review of systems were noted in the above HPI section.  All other review of systems was otherwise negative.  Outpatient Encounter Prescriptions as of 04/08/2016  Medication Sig  . acetaminophen (TYLENOL) 500 MG tablet Take 500 mg by mouth every 6 (six) hours as needed.  . butalbital-aspirin-caffeine (FIORINAL) 50-325-40 MG capsule Take 1 capsule by mouth every 6 (six) hours as needed for headache.  . Calcium Carbonate-Vitamin D (CALTRATE  600+D) 600-400 MG-UNIT tablet Take 1 tablet by mouth daily.  . lansoprazole (PREVACID) 30 MG capsule Take 1 capsule (30 mg total) by mouth daily at 12 noon.  . Multiple Vitamins-Minerals (PRESERVISION AREDS PO) Take 1 tablet by mouth daily.  . Omega-3 Fatty Acids (FISH OIL) 1200 MG CAPS Take 1 capsule by mouth daily.  . polyethylene glycol (MIRALAX / GLYCOLAX) packet Take 17 g by mouth daily.  . pramipexole (MIRAPEX) 0.5 MG tablet TAKE 1 TABLET BY MOUTH AT BEDTIME  . [DISCONTINUED] alendronate (FOSAMAX) 70 MG tablet Take 1 tablet (70 mg total) by mouth every 7 (seven) days. Take with a full glass of water on an empty stomach.  . [DISCONTINUED] calcium citrate-vitamin D 200-200 MG-UNIT TABS Take 1 tablet by mouth daily.  . [DISCONTINUED] fish oil-omega-3 fatty acids 1000 MG capsule Take 2 g by mouth daily.  . [DISCONTINUED] naproxen sodium (ANAPROX) 220 MG tablet Take 220 mg by mouth as needed.   No facility-administered encounter medications on file as of 04/08/2016.    No Known Allergies Patient Active Problem List   Diagnosis Date Noted  . Obstructive sleep apnea 04/13/2014  . History of migraine headaches 01/12/2013  . Hyperlipidemia 09/02/2011  . Osteoporosis 05/01/2011  . Nonallopathic lesion of sacral region 04/17/2011  . RESTLESS LEG SYNDROME 10/02/2010  . STYE 10/02/2010  . GERD 10/02/2010  . CONSTIPATION 10/02/2010  . OSTEOARTHRITIS 10/02/2010  . TRANSIENT ISCHEMIC ATTACKS, HX OF 10/02/2010  . BACK PAIN 07/18/2008  . SACROILIAC JOINT DYSFUNCTION 06/16/2008  .  UNEQUAL LEG LENGTH 06/16/2008  . SCOLIOSIS, MILD 06/16/2008  . LAMINECTOMY, LUMBAR, HX OF 06/16/2008   Social History   Social History  . Marital status: Married    Spouse name: N/A  . Number of children: N/A  . Years of education: N/A   Occupational History  . Not on file.   Social History Main Topics  . Smoking status: Never Smoker  . Smokeless tobacco: Never Used  . Alcohol use Yes     Comment: at  dinner with friends, 2-3 per month - wine  . Drug use: Unknown  . Sexual activity: Not on file   Other Topics Concern  . Not on file   Social History Narrative  . No narrative on file    Ms. Tunison's family history includes Breast cancer in her paternal aunt; Diabetes in her father; Pancreatitis in her brother; Stroke in her father.      Objective:    Vitals:   04/08/16 0919  BP: (!) 120/50  Pulse: 80    Physical Exam    well-developed elderly white female in no acute distress, blood pressure 120/50 pulse 80, height 5 foot 4 weight 136, BMI 23.4. HEENT ;nontraumatic normocephalic EOMI PERRLA sclera anicteric, Cardiovascular; regular rate and rhythm with S1-S2 no murmur or gallop, Pulmonary; clear bilaterally, Abdomen; soft, bowel sounds are present she has a rounded fullness in the left mid quadrant firm but not tender, no palpable hepatosplenomegaly on the Rectal; exam sphincter tone somewhat decreased soft brown stool in the rectal vault Hemoccult negative, Extremities; no clubbing cyanosis or edema skin warm and dry, Neuropsych; mood and affect appropriate       Assessment & Plan:   #67 82 year old white female with long history of mild chronic constipation with recent worsening and narrow caliber stools. I suspect recent episode of more severe constipation was medication related. On exam she does have a rounded fullness in the left lower quadrant we'll rule out mass #2 CLL #3 remote partial resection of liver #4 status post remote repair of cystocele  Plan; schedule for CT of the abdomen and pelvis with contrast Patient will be given  a MiraLAX bowel purge to clear her bowel and then will resume it MiraLAX 1-1-1/2 doses daily on a regular basis. She is also encouraged to continue liberal water intake and high fiber diet  Amy S Esterwood PA-C 04/08/2016   Cc: Eulas Post, MD

## 2016-04-10 ENCOUNTER — Telehealth: Payer: Self-pay | Admitting: *Deleted

## 2016-04-10 ENCOUNTER — Encounter: Payer: Self-pay | Admitting: *Deleted

## 2016-04-10 NOTE — Telephone Encounter (Signed)
Patient calling to request dr Alen Blew call her re: her labs from last visit

## 2016-04-10 NOTE — Telephone Encounter (Signed)
Patient would like for dr Alen Blew to call her re: her labs

## 2016-04-12 ENCOUNTER — Ambulatory Visit (INDEPENDENT_AMBULATORY_CARE_PROVIDER_SITE_OTHER)
Admission: RE | Admit: 2016-04-12 | Discharge: 2016-04-12 | Disposition: A | Discharge status: Home / Self Care | Payer: Medicare Other | Source: Ambulatory Visit | Attending: Physician Assistant | Admitting: Physician Assistant

## 2016-04-12 DIAGNOSIS — K5900 Constipation, unspecified: Secondary | ICD-10-CM | POA: Diagnosis not present

## 2016-04-12 DIAGNOSIS — R198 Other specified symptoms and signs involving the digestive system and abdomen: Secondary | ICD-10-CM

## 2016-04-12 DIAGNOSIS — R1032 Left lower quadrant pain: Secondary | ICD-10-CM

## 2016-04-12 MED ORDER — IOPAMIDOL (ISOVUE-300) INJECTION 61%
100.0000 mL | Freq: Once | INTRAVENOUS | Status: AC | PRN
  Administered 2016-04-12: 100 mL via INTRAVENOUS

## 2016-04-27 NOTE — Progress Notes (Signed)
Agree with Ms. Genia Harold assessment and plan.  CT showed splenomegaly thought to be typical for CLL  Gatha Mayer, MD, [facility]

## 2016-06-08 DIAGNOSIS — Z23 Encounter for immunization: Secondary | ICD-10-CM | POA: Diagnosis not present

## 2016-06-14 ENCOUNTER — Other Ambulatory Visit: Payer: Self-pay | Admitting: Family Medicine

## 2016-07-25 ENCOUNTER — Emergency Department (HOSPITAL_COMMUNITY): Payer: Medicare Other

## 2016-07-25 ENCOUNTER — Emergency Department (HOSPITAL_COMMUNITY)
Admission: EM | Admit: 2016-07-25 | Discharge: 2016-07-25 | Disposition: A | Discharge status: Home / Self Care | Payer: Medicare Other | Attending: Emergency Medicine | Admitting: Emergency Medicine

## 2016-07-25 ENCOUNTER — Encounter (HOSPITAL_COMMUNITY): Payer: Self-pay | Admitting: Emergency Medicine

## 2016-07-25 ENCOUNTER — Telehealth: Payer: Self-pay | Admitting: Family Medicine

## 2016-07-25 ENCOUNTER — Telehealth: Payer: Self-pay | Admitting: Physician Assistant

## 2016-07-25 DIAGNOSIS — K5900 Constipation, unspecified: Secondary | ICD-10-CM | POA: Diagnosis not present

## 2016-07-25 DIAGNOSIS — R1032 Left lower quadrant pain: Secondary | ICD-10-CM | POA: Diagnosis not present

## 2016-07-25 DIAGNOSIS — Z8673 Personal history of transient ischemic attack (TIA), and cerebral infarction without residual deficits: Secondary | ICD-10-CM | POA: Insufficient documentation

## 2016-07-25 LAB — URINALYSIS, ROUTINE W REFLEX MICROSCOPIC
Bilirubin Urine: NEGATIVE
Glucose, UA: NEGATIVE mg/dL
Hgb urine dipstick: NEGATIVE
Ketones, ur: NEGATIVE mg/dL
Leukocytes, UA: NEGATIVE
Nitrite: NEGATIVE
Protein, ur: NEGATIVE mg/dL
Specific Gravity, Urine: 1.028 (ref 1.005–1.030)
pH: 6 (ref 5.0–8.0)

## 2016-07-25 LAB — CBC WITH DIFFERENTIAL/PLATELET
Basophils Absolute: 0 10*3/uL (ref 0.0–0.1)
Basophils Relative: 0 %
Eosinophils Absolute: 0.1 10*3/uL (ref 0.0–0.7)
Eosinophils Relative: 1 %
HCT: 36.3 % (ref 36.0–46.0)
Hemoglobin: 12 g/dL (ref 12.0–15.0)
Lymphocytes Relative: 66 %
Lymphs Abs: 6.9 10*3/uL — ABNORMAL HIGH (ref 0.7–4.0)
MCH: 25.1 pg — ABNORMAL LOW (ref 26.0–34.0)
MCHC: 33.1 g/dL (ref 30.0–36.0)
MCV: 75.8 fL — ABNORMAL LOW (ref 78.0–100.0)
Monocytes Absolute: 0.4 10*3/uL (ref 0.1–1.0)
Monocytes Relative: 4 %
Neutro Abs: 3 10*3/uL (ref 1.7–7.7)
Neutrophils Relative %: 29 %
Platelets: 116 10*3/uL — ABNORMAL LOW (ref 150–400)
RBC: 4.79 MIL/uL (ref 3.87–5.11)
RDW: 16.1 % — ABNORMAL HIGH (ref 11.5–15.5)
WBC: 10.4 10*3/uL (ref 4.0–10.5)

## 2016-07-25 LAB — COMPREHENSIVE METABOLIC PANEL
ALT: 20 U/L (ref 14–54)
AST: 33 U/L (ref 15–41)
Albumin: 3.7 g/dL (ref 3.5–5.0)
Alkaline Phosphatase: 78 U/L (ref 38–126)
Anion gap: 6 (ref 5–15)
BUN: 10 mg/dL (ref 6–20)
CO2: 25 mmol/L (ref 22–32)
Calcium: 10.2 mg/dL (ref 8.9–10.3)
Chloride: 105 mmol/L (ref 101–111)
Creatinine, Ser: 0.77 mg/dL (ref 0.44–1.00)
GFR calc Af Amer: >60 mL/min (ref >60)
GFR calc non Af Amer: >60 mL/min (ref >60)
Glucose, Bld: 99 mg/dL (ref 65–99)
Potassium: 4.1 mmol/L (ref 3.5–5.1)
Sodium: 136 mmol/L (ref 135–145)
Total Bilirubin: 1.3 mg/dL — ABNORMAL HIGH (ref 0.3–1.2)
Total Protein: 7.3 g/dL (ref 6.5–8.1)

## 2016-07-25 MED ORDER — BISACODYL 10 MG RE SUPP: 10.0000 mg | RECTAL | 0 refills | Status: DC | PRN

## 2016-07-25 MED ORDER — IOPAMIDOL (ISOVUE-300) INJECTION 61%
INTRAVENOUS | Status: AC
Start: 2016-07-25 — End: 2016-07-25
  Administered 2016-07-25: 100 mL
  Filled 2016-07-25: qty 100

## 2016-07-25 MED ORDER — DOCUSATE SODIUM 100 MG PO CAPS: 100.0000 mg | ORAL_CAPSULE | Freq: Every day | ORAL | 0 refills | Status: DC

## 2016-07-25 MED ORDER — SODIUM CHLORIDE 0.9 % IV BOLUS (SEPSIS)
1000.0000 mL | Freq: Once | INTRAVENOUS | Status: AC
  Administered 2016-07-25: 1000 mL via INTRAVENOUS

## 2016-07-25 MED ORDER — PEG 3350-KCL-NABCB-NACL-NASULF 236 G PO SOLR: 4000.0000 mL | Freq: Once | ORAL | 0 refills | Status: AC

## 2016-07-25 NOTE — Discharge Instructions (Signed)
Drink the Golytely jug over two hours, you can also use a suppository to help stimulate a bowel movement from below. Come back if you are not able to keep the Golytely down or have any other concerns.

## 2016-07-25 NOTE — Telephone Encounter (Signed)
Pt has checked in to [facility] ED. Nothing further needed at this time.

## 2016-07-25 NOTE — Telephone Encounter (Signed)
Patient and her husband walk in to the office. She was hoping to be seen. I did check, but there were no openings or available providers.  She is reporting poor bowel movements for 2 weeks. She is passing liquid but has bloating and fullness. States she is using Miralax, olive oil and prunes. Constipation has been a long term problem, but she does not recall any history of obstruction. Appointment made for her 07/29/16. She is considering going to [facility] on [address]. Encouraged to do this if she is in pain.  Discussed this with Nicoletta Ba, PA-C

## 2016-07-25 NOTE — Telephone Encounter (Signed)
Patient Name: Briana Fitzgerald  DOB: November 01, 1931    Initial Comment Caller states c/o constipation. No bowel movement x 2 wks.    Nurse Assessment  Nurse: Raphael Gibney, RN, Vanita Ingles Date/Time Eilene Ghazi Time): 07/25/2016 2:26:06 PM  Confirm and document reason for call. If symptomatic, describe symptoms. You must click the next button to save text entered. ---Caller states she has not had a BM for 2 weeks. She is taking miralax. She drinks hot water and she is eating prunes. Her abd is swollen. Having abd pain on the left side. Pain is 7 and higher. she is nauseated but no vomiting.  Has the patient traveled out of the country within the last 30 days? ---Not Applicable  Does the patient have any new or worsening symptoms? ---Yes  Will a triage be completed? ---Yes  Related visit to physician within the last 2 weeks? ---No  Does the PT have any chronic conditions? (i.e. diabetes, asthma, etc.) ---Yes  List chronic conditions. ---chronic constipation  Is this a behavioral health or substance abuse call? ---No     Guidelines    Guideline Title Affirmed Question Affirmed Notes  Abdominal Pain - Female [1] SEVERE pain AND [2] age > 53    Final Disposition User   Go to ED Now Raphael Gibney, RN, [facility] - ED   Disagree/Comply: Comply

## 2016-07-25 NOTE — ED Provider Notes (Signed)
[place] DEPT Provider Note   CSN: [id number] Arrival date & time: 07/25/16  1647     History   Chief Complaint Chief Complaint  Patient presents with  . Constipation    HPI Briana Fitzgerald is a 82 y.o. female.  Patient presents with constipation for two weeks refractory to Miralax and two enemas over-the-counter. Has had persistent left lower quadrant abdominal pain since then. Reports that she has had a six pound weight loss in the last two weeks and has not been eating much. Has had watery bowel movements but nothing solid in two weeks. She denies recent fevers. Has a history of multiple abdominal surgeries.   The history is provided by the patient. No language interpreter was used.  Constipation   This is a chronic problem. The current episode started more than 1 week ago. The stool is described as watery. Associated symptoms include abdominal pain. There is fiber in the patient's diet. There has been adequate water intake. Treatments tried: Miralax, enemas. The treatment provided no relief. Her past medical history does not include metabolic disease or neurologic disease.    Past Medical History:  Diagnosis Date  . Arthritis   . BACK PAIN 07/18/2008  . Bowel obstruction   . CONSTIPATION 10/02/2010  . GERD 10/02/2010  . Headache   . IBS (irritable bowel syndrome)   . LAMINECTOMY, LUMBAR, HX OF 06/16/2008  . OSTEOARTHRITIS 10/02/2010  . Pneumonia   . RESTLESS LEG SYNDROME 10/02/2010  . SACROILIAC JOINT DYSFUNCTION 06/16/2008  . Sleep apnea   . STYE 10/02/2010  . TRANSIENT ISCHEMIC ATTACKS, HX OF 10/02/2010    Patient Active Problem List   Diagnosis Date Noted  . Obstructive sleep apnea 04/13/2014  . History of migraine headaches 01/12/2013  . Hyperlipidemia 09/02/2011  . Osteoporosis 05/01/2011  . Nonallopathic lesion of sacral region 04/17/2011  . RESTLESS LEG SYNDROME 10/02/2010  . STYE 10/02/2010  . GERD 10/02/2010  . CONSTIPATION 10/02/2010  .  OSTEOARTHRITIS 10/02/2010  . TRANSIENT ISCHEMIC ATTACKS, HX OF 10/02/2010  . BACK PAIN 07/18/2008  . SACROILIAC JOINT DYSFUNCTION 06/16/2008  . UNEQUAL LEG LENGTH 06/16/2008  . SCOLIOSIS, MILD 06/16/2008  . LAMINECTOMY, LUMBAR, HX OF 06/16/2008    Past Surgical History:  Procedure Laterality Date  . ABDOMINAL HYSTERECTOMY  1979   TAH, fibroids  . [facility] [place]  2005  . APPENDECTOMY  1941  . EYE SURGERY  2005   catarac,both eyes  . LIVER BIOPSY  1997   benign, hemangioma resection  . SPINE SURGERY  2006   laminectomy    OB History    No data available       Home Medications    Prior to Admission medications   Medication Sig Start Date End Date Taking? Authorizing Provider  acetaminophen (TYLENOL) 500 MG tablet Take 500 mg by mouth every 6 (six) hours as needed.   Yes Historical Provider, MD  butalbital-aspirin-caffeine [facility]) [id number] MG capsule Take 1 capsule by mouth every 6 (six) hours as needed for headache. 02/19/16  Yes Eulas Post, MD  Calcium Carbonate-Vitamin D (CALTRATE 600+D) 600-400 MG-UNIT tablet Take 1 tablet by mouth daily.   Yes Historical Provider, MD  lansoprazole (PREVACID) 30 MG capsule Take 1 capsule (30 mg total) by mouth daily at 12 noon. 03/11/16  Yes Eulas Post, MD  Multiple Vitamins-Minerals (PRESERVISION AREDS PO) Take 1 tablet by mouth daily.   Yes Historical Provider, MD  Omega-3 Fatty Acids (FISH OIL) 1200 MG CAPS Take 2  capsules by mouth daily.    Yes Historical Provider, MD  polyethylene glycol (MIRALAX / GLYCOLAX) packet Take 17 g by mouth daily.   Yes Historical Provider, MD  pramipexole (MIRAPEX) 0.5 MG tablet TAKE 1 TABLET BY MOUTH AT BEDTIME 06/14/16  Yes Eulas Post, MD  bisacodyl (DULCOLAX) 10 MG suppository Place 1 suppository (10 mg total) rectally as needed for moderate constipation. 07/25/16   Harlin Heys, MD  docusate sodium (COLACE) 100 MG capsule Take 1 capsule (100 mg total) by mouth daily. 07/25/16    Harlin Heys, MD    Family History Family History  Problem Relation Age of Onset  . Stroke Father   . Diabetes Father     type ll  . Pancreatitis Brother   . Breast cancer Paternal Aunt     aunt  . Colon cancer Neg Hx     Social History Social History  Substance Use Topics  . Smoking status: Never Smoker  . Smokeless tobacco: Never Used  . Alcohol use Yes     Comment: at dinner with friends, 2-3 per month - wine     Allergies   Patient has no known allergies.   Review of Systems Review of Systems  Constitutional: Positive for unexpected weight change. Negative for fever.  HENT: Negative.   Respiratory: Negative.   Cardiovascular: Negative.   Gastrointestinal: Positive for abdominal pain, constipation and nausea. Negative for vomiting.  Genitourinary: Negative.   Musculoskeletal: Negative.   Skin: Negative.   Allergic/Immunologic: Negative for immunocompromised state.  Neurological: Negative.   Hematological: Does not bruise/bleed easily.  Psychiatric/Behavioral: Negative.      Physical Exam Updated Vital Signs BP 130/64   Pulse 74   Temp 98 F (36.7 C) (Oral)   Resp 16   Ht 5\' 5"  (1.651 m)   Wt 59.9 kg   SpO2 99%   BMI 21.97 kg/m   Physical Exam  Constitutional: She is oriented to person, place, and time. She appears well-developed and well-nourished. No distress.  HENT:  Head: Normocephalic and atraumatic.  Eyes: Conjunctivae and EOM are normal.  Neck: Normal range of motion. Neck supple.  Cardiovascular: Normal rate, regular rhythm and normal heart sounds.  Exam reveals no gallop and no friction rub.   No murmur heard. Pulmonary/Chest: Effort normal and breath sounds normal. No respiratory distress. She has no wheezes. She has no rales.  Abdominal:  Markedly distended. Hypoactive bowel sounds. Tender to left lower quadrant. No rebound or guarding.  Neurological: She is alert and oriented to person, place, and time.  Skin: Skin is warm and  dry. Capillary refill takes less than 2 seconds. She is not diaphoretic.  Psychiatric: She has a normal mood and affect. Her behavior is normal. Judgment and thought content normal.     ED Treatments / Results  Labs (all labs ordered are listed, but only abnormal results are displayed) Labs Reviewed  CBC WITH DIFFERENTIAL/PLATELET - Abnormal; Notable for the following:       Result Value   MCV 75.8 (*)    MCH 25.1 (*)    RDW 16.1 (*)    Platelets 116 (*)    Lymphs Abs 6.9 (*)    All other components within normal limits  COMPREHENSIVE METABOLIC PANEL - Abnormal; Notable for the following:    Total Bilirubin 1.3 (*)    All other components within normal limits  URINALYSIS, ROUTINE W REFLEX MICROSCOPIC (NOT AT [facility])    EKG  EKG Interpretation None  Radiology Ct Abdomen Pelvis W Contrast  Result Date: 07/25/2016 CLINICAL DATA:  82 year old female with constipation left lower quadrant pain for 2 weeks. Prior appendectomy hysterectomy and lumbar surgery. Initial encounter. EXAM: CT ABDOMEN AND PELVIS WITH CONTRAST TECHNIQUE: Multidetector CT imaging of the abdomen and pelvis was performed using the standard protocol following bolus administration of intravenous contrast. CONTRAST:  145mL ISOVUE-300 IOPAMIDOL (ISOVUE-300) INJECTION 61% COMPARISON:  04/12/2016. FINDINGS: Lower chest: Scarring lung bases. Hepatobiliary: 1.2 cm hyperdensity right lobe liver unchanged of indeterminate etiology. No calcified gallstones. Pancreas: No mass or inflammation. Spleen: Markedly enlarged spleen spanning over 22 cm with transverse dimension of 16.1 x 9 cm. Atypical enhancement of the central versus peripheral aspect of the spleen which may indicate regions of different flow dynamics. The splenic vein is markedly enlarged with prominent coronary vein and splenorenal shunt. No other varices noted. Adrenals/Urinary Tract: No renal or adrenal lesion or evidence hydronephrosis. Stomach/Bowel:  Postsurgical changes gastric fundus region. No extra luminal bowel inflammatory process, free fluid or free air. Mild rectal prolapse. There is a segment of rectosigmoid junction which appears narrowed (sagittal image 60) which was previously noted to be narrowed and may represent a stricture of indeterminate etiology. Vascular/Lymphatic: Aortic calcification with mild ectasia without aneurysmal dilation. Right iliac calcification without high-grade stenosis. Reproductive: Prior hysterectomy. Other: Negative Musculoskeletal: Prior laminectomy L3 through L5. Fusion L4-5 and T12-L1. Scoliosis. Superimposed degenerative changes. Various degrees of spinal stenosis and foraminal narrowing. Bilateral hip joint degenerative changes. IMPRESSION: Markedly enlarged spleen spanning over 22 cm. Atypical enhancement of the central versus peripheral aspect of the spleen which may indicate regions of different flow dynamics. The splenic vein is markedly enlarged with prominent coronary vein and splenorenal shunt. No extra luminal bowel inflammatory process, free fluid or free air. Mild rectal prolapse. 4 cm segment of rectosigmoid junction appears narrowed (sagittal image 60) and was previously noted to be narrowed and may represent a stricture of indeterminate etiology. Electronically Signed   By: Genia Del M.D.   On: 07/25/2016 22:14    Procedures Procedures (including critical care time)  Medications Ordered in ED Medications  iopamidol (ISOVUE-300) 61 % injection (100 mLs  Contrast Given 07/25/16 2134)  sodium chloride 0.9 % bolus 1,000 mL (0 mLs Intravenous Stopped 07/25/16 2312)     Initial Impression / Assessment and Plan / ED Course  I have reviewed the triage vital signs and the nursing notes.  Pertinent labs & imaging results that were available during my care of the patient were reviewed by me and considered in my medical decision making (see chart for details).  Clinical Course     Patient  presents with two weeks of constipation, weight loss of 6 pounds, and poor appetite. History of constipation previously but never this bad. She is well-appearing and ambulatory with normal vital signs. Physical exam reveals distended but soft abdomen with hypoactive bowel sounds, tenderness to LLQ. Labs unremarkable. CT obtained to evaluate for obstruction which revealed no acute obstruction but did show a likely stricture in the rectosigmoid colon which may be making bowel movements more difficult. This was present on a prior CT scan. Also revealed splenomegaly which is likely due to her new diagnosis of CLL. She will be prescribed Golytely and Dulcolax suppositories for symptom relief. She has follow up with GI scheduled for Monday. She was given return precautions for worsening symptoms and expressed understanding. She is in good condition for discharge home.  Final Clinical Impressions(s) / ED Diagnoses   Final  diagnoses:  Constipation, unspecified constipation type    New Prescriptions Discharge Medication List as of 07/25/2016 11:02 PM    START taking these medications   Details  bisacodyl (DULCOLAX) 10 MG suppository Place 1 suppository (10 mg total) rectally as needed for moderate constipation., Starting Thu 07/25/2016, Print    docusate sodium (COLACE) 100 MG capsule Take 1 capsule (100 mg total) by mouth daily., Starting Thu 07/25/2016, Print    polyethylene glycol (GOLYTELY) 236 g solution Take 4,000 mLs by mouth once., Starting Thu 07/25/2016, Print         Harlin Heys, MD 07/26/16 [id number]    Blanchie Dessert, MD 07/26/16 1758

## 2016-07-25 NOTE — ED Triage Notes (Addendum)
Pt c/o constipation x's 2 weeks.  St's has been taking stool softeners and lax. Without relief.  Also c/o left abd pain.  Pt was sent here by her MD

## 2016-07-26 LAB — PATHOLOGIST SMEAR REVIEW

## 2016-07-29 ENCOUNTER — Ambulatory Visit: Payer: Medicare Other | Admitting: Nurse Practitioner

## 2016-08-05 DIAGNOSIS — H5712 Ocular pain, left eye: Secondary | ICD-10-CM | POA: Diagnosis not present

## 2016-08-06 ENCOUNTER — Ambulatory Visit (INDEPENDENT_AMBULATORY_CARE_PROVIDER_SITE_OTHER): Payer: Medicare Other | Admitting: Physician Assistant

## 2016-08-06 ENCOUNTER — Encounter: Payer: Self-pay | Admitting: Physician Assistant

## 2016-08-06 VITALS — Ht 64.17 in | Wt 138.4 lb

## 2016-08-06 DIAGNOSIS — K5909 Other constipation: Secondary | ICD-10-CM | POA: Diagnosis not present

## 2016-08-06 DIAGNOSIS — R935 Abnormal findings on diagnostic imaging of other abdominal regions, including retroperitoneum: Secondary | ICD-10-CM | POA: Diagnosis not present

## 2016-08-06 MED ORDER — PEG 3350-KCL-NABCB-NACL-NASULF 236 G PO SOLR: ORAL | 0 refills | Status: DC

## 2016-08-06 NOTE — Progress Notes (Signed)
Subjective:    Patient ID: Briana Fitzgerald, female    DOB: September 30, 1931, 82 y.o.   MRN: [id number]  HPI Briana Fitzgerald is an 82 year old white female, who was initially seen in our office in July 2017 as a new patient after being referred by Dr. Elease Hashimoto for lower abdominal discomfort and constipation. At that time she was established with Dr. Carlean Purl on the was started on a MiraLAX bowel purge and then daily MiraLAX thereafter and underwent CT of the abdomen and pelvis. CT showed significant splenomegaly with evidence of a coronary vein enlargement and spontaneous splenorenal shunt to the right renal vein and IVC. Patient does have CLL, CT findings were relayed to her oncologist who felt splenomegaly was expected in this patient and will monitor. Patient had an ER visit on 07/25/2016 with abdominal pain and constipation. He had had no significant bowel movements for about 2 weeks. CT done at that time showed again a markedly enlarged spleen measuring 22 x 16 x 9 cm with an enlarged splenic vein changes of mild rectal prolapse and at the rectosigmoid junction area is a 4 cm segment which appears narrowed question stricture of undetermined etiology. Patient took a GoLYTELY prep after that visit and says she was able to get her bowels completely cleaned out and felt much better. She is now back on MiraLAX but has a hard time regulating it. She says one dose doesn't seem to be enough and 2 doses seems to be too much so she is trying to take a dose in half daily currently. She comes back to discuss the CT findings. Patient has not had a colonoscopy since about 2007 and states that that was done in [place]. She believes it was a normal exam. Review of Systems Pertinent positive and negative review of systems were noted in the above HPI section.  All other review of systems was otherwise negative.  Outpatient Encounter Prescriptions as of 08/06/2016  Medication Sig  . acetaminophen (TYLENOL) 500 MG tablet Take  500 mg by mouth every 6 (six) hours as needed.  . bisacodyl (DULCOLAX) 10 MG suppository Place 1 suppository (10 mg total) rectally as needed for moderate constipation.  . butalbital-aspirin-caffeine (FIORINAL) 50-325-40 MG capsule Take 1 capsule by mouth every 6 (six) hours as needed for headache.  . Calcium Carbonate-Vitamin D (CALTRATE 600+D) 600-400 MG-UNIT tablet Take 1 tablet by mouth daily.  Marland Kitchen docusate sodium (COLACE) 100 MG capsule Take 1 capsule (100 mg total) by mouth daily.  . lansoprazole (PREVACID) 30 MG capsule Take 1 capsule (30 mg total) by mouth daily at 12 noon. (Patient taking differently: Take 30 mg by mouth as needed. )  . Multiple Vitamins-Minerals (PRESERVISION AREDS PO) Take 1 tablet by mouth daily.  . Omega-3 Fatty Acids (FISH OIL) 1200 MG CAPS Take 2 capsules by mouth daily.   . polyethylene glycol (MIRALAX / GLYCOLAX) packet Take 17 g by mouth daily.  . pramipexole (MIRAPEX) 0.5 MG tablet TAKE 1 TABLET BY MOUTH AT BEDTIME  . polyethylene glycol (GOLYTELY) 236 g solution Take as directed for Barium Enema evening before the test.   No facility-administered encounter medications on file as of 08/06/2016.    No Known Allergies Patient Active Problem List   Diagnosis Date Noted  . Obstructive sleep apnea 04/13/2014  . History of migraine headaches 01/12/2013  . Hyperlipidemia 09/02/2011  . Osteoporosis 05/01/2011  . Nonallopathic lesion of sacral region 04/17/2011  . RESTLESS LEG SYNDROME 10/02/2010  . STYE 10/02/2010  . GERD  10/02/2010  . CONSTIPATION 10/02/2010  . OSTEOARTHRITIS 10/02/2010  . TRANSIENT ISCHEMIC ATTACKS, HX OF 10/02/2010  . BACK PAIN 07/18/2008  . SACROILIAC JOINT DYSFUNCTION 06/16/2008  . UNEQUAL LEG LENGTH 06/16/2008  . SCOLIOSIS, MILD 06/16/2008  . LAMINECTOMY, LUMBAR, HX OF 06/16/2008   Social History   Social History  . Marital status: Married    Spouse name: N/A  . Number of children: 2  . Years of education: N/A   Occupational  History  . retired    Social History Main Topics  . Smoking status: Never Smoker  . Smokeless tobacco: Never Used  . Alcohol use Yes     Comment: at dinner with friends, 2-3 per month - wine  . Drug use: No  . Sexual activity: Not on file   Other Topics Concern  . Not on file   Social History Narrative  . No narrative on file    Briana Fitzgerald's family history includes Breast cancer in her paternal aunt; Diabetes in her father; Pancreatitis in her brother; Stroke in her father.      Objective:    There were no vitals filed for this visit.  Physical Exam  well-developed elderly white female in no acute distress Height 5 foot 4, weight 138, BMI 23.6. HEENT; nontraumatic normocephalic EOMI PERRLA sclera anicteric, Cardiovascular; regular rate and rhythm with S1-S2 no murmur or gallop, Pulmonary; clear bilaterally, Abdomen; soft he's basically nontender there is no palpable mass, spleen is markedly enlarged and palpable down 5 fingerbreadths below the left costal margin, nontender, Rectal ;exam not done, Extremities; no clubbing cyanosis or edema skin warm and dry, Neuropsych ;mood and affect appropriate       Assessment & Plan:   #60 82 year old female with chronic constipation with recurrent episodes of significant obstipation. Most recent CT scan done 07/25/2016 raising question of a rectosigmoid junction stricture. #2 marked splenomegaly related to CLL #3 CLL # 4 status post remote hysterectomy #5 obstructive sleep apnea  Plan; for now will continue MiraLAX 1-1-1/2 doses daily to keep stools loose Recommended colonoscopy to patient which she is very reluctant to proceed with. She has concerns about anesthesia. Offered barium enema as initial exam and she would prefer to pursue this first. Will order barium enema then decide on any indication for pursuing colonoscopy.    Briana Mcglone Genia Harold PA-C 08/06/2016   Cc: Eulas Post, MD

## 2016-08-06 NOTE — Patient Instructions (Addendum)
Continue Miralax 1-1 1/2 dose every day.  We have scheduled the Barium enema for 08-21-2016. Arrive at 10:15 am.  Location for test is [facility]- radiology.   We sent a prescripion for the Golytley prep to [facility].  You will drink this the afternoon or evening before the test.  Be on clear liquids 24 hours before the test.   Have clear liquids the day before the procedure.  The morning of the procedure you can only have black coffee and water.

## 2016-08-07 ENCOUNTER — Telehealth: Payer: Self-pay | Admitting: Physician Assistant

## 2016-08-07 NOTE — Telephone Encounter (Signed)
The patient wanted to know why on her My Chart information it stated she is having a colonoscopy.  I looked at the order and it is DG Colon.  I explained she has to get cleaned out but this is an X-Ray of the colon.  She asked if they would be inserting a device or scope in her rectum and I told her no. I believe she then understood what she was having.

## 2016-08-19 ENCOUNTER — Telehealth: Payer: Self-pay | Admitting: Physician Assistant

## 2016-08-19 NOTE — Telephone Encounter (Signed)
Explained to the patient that she is having a radiology procedure at [facility].

## 2016-08-21 ENCOUNTER — Other Ambulatory Visit: Payer: Self-pay | Admitting: Physician Assistant

## 2016-08-21 ENCOUNTER — Ambulatory Visit (HOSPITAL_COMMUNITY)
Admission: RE | Admit: 2016-08-21 | Discharge: 2016-08-21 | Disposition: A | Discharge status: Home / Self Care | Payer: Medicare Other | Source: Ambulatory Visit | Attending: Physician Assistant | Admitting: Physician Assistant

## 2016-08-21 DIAGNOSIS — K5900 Constipation, unspecified: Secondary | ICD-10-CM | POA: Diagnosis not present

## 2016-08-21 DIAGNOSIS — R935 Abnormal findings on diagnostic imaging of other abdominal regions, including retroperitoneum: Secondary | ICD-10-CM

## 2016-08-21 DIAGNOSIS — K5909 Other constipation: Secondary | ICD-10-CM

## 2016-08-21 DIAGNOSIS — K5730 Diverticulosis of large intestine without perforation or abscess without bleeding: Secondary | ICD-10-CM | POA: Insufficient documentation

## 2016-08-22 NOTE — Progress Notes (Signed)
Agree with Ms. Genia Harold assessment and plan. Gatha Mayer, MD, Marval Regal  BE did not show stricture

## 2016-09-04 DIAGNOSIS — H5711 Ocular pain, right eye: Secondary | ICD-10-CM | POA: Diagnosis not present

## 2016-10-02 ENCOUNTER — Telehealth: Payer: Self-pay | Admitting: Oncology

## 2016-10-02 ENCOUNTER — Encounter: Payer: Self-pay | Admitting: *Deleted

## 2016-10-02 NOTE — Telephone Encounter (Signed)
sw pt to confirm 1/30 appt date/time due to rescheduling because of weather 1/17

## 2016-10-02 NOTE — Telephone Encounter (Signed)
Patient called to cancel her appointment tomorrow 10/03/16 due to inclement weather and she needs to reschedule call back number is [phone]

## 2016-10-02 NOTE — Progress Notes (Signed)
Called patient, she will not be able to get here 1/18 d/t snow. Los to schedulers to r/s lab and dr fs

## 2016-10-03 ENCOUNTER — Other Ambulatory Visit: Payer: Medicare Other

## 2016-10-03 ENCOUNTER — Ambulatory Visit: Payer: Medicare Other | Admitting: Oncology

## 2016-10-15 ENCOUNTER — Telehealth: Payer: Self-pay | Admitting: Oncology

## 2016-10-15 ENCOUNTER — Ambulatory Visit (HOSPITAL_BASED_OUTPATIENT_CLINIC_OR_DEPARTMENT_OTHER): Payer: Medicare Other | Admitting: Oncology

## 2016-10-15 ENCOUNTER — Other Ambulatory Visit (HOSPITAL_BASED_OUTPATIENT_CLINIC_OR_DEPARTMENT_OTHER): Payer: Medicare Other

## 2016-10-15 VITALS — BP 130/51 | HR 82 | Temp 97.5°F | Resp 18 | Ht 64.0 in | Wt 134.4 lb

## 2016-10-15 DIAGNOSIS — D72820 Lymphocytosis (symptomatic): Secondary | ICD-10-CM

## 2016-10-15 DIAGNOSIS — C9110 Chronic lymphocytic leukemia of B-cell type not having achieved remission: Secondary | ICD-10-CM

## 2016-10-15 DIAGNOSIS — D696 Thrombocytopenia, unspecified: Secondary | ICD-10-CM

## 2016-10-15 DIAGNOSIS — D72829 Elevated white blood cell count, unspecified: Secondary | ICD-10-CM

## 2016-10-15 LAB — LACTATE DEHYDROGENASE: LDH: 213 U/L (ref 125–245)

## 2016-10-15 LAB — COMPREHENSIVE METABOLIC PANEL
ALT: 22 U/L (ref 0–55)
AST: 28 U/L (ref 5–34)
Albumin: 3.7 g/dL (ref 3.5–5.0)
Alkaline Phosphatase: 88 U/L (ref 40–150)
Anion Gap: 8 mEq/L (ref 3–11)
BUN: 14.1 mg/dL (ref 7.0–26.0)
CO2: 25 mEq/L (ref 22–29)
Calcium: 10.5 mg/dL — ABNORMAL HIGH (ref 8.4–10.4)
Chloride: 106 mEq/L (ref 98–109)
Creatinine: 0.9 mg/dL (ref 0.6–1.1)
EGFR: 58 mL/min/{1.73_m2} — ABNORMAL LOW (ref >90)
Glucose: 122 mg/dl (ref 70–140)
Potassium: 4 mEq/L (ref 3.5–5.1)
Sodium: 138 mEq/L (ref 136–145)
Total Bilirubin: 1.2 mg/dL (ref 0.20–1.20)
Total Protein: 7.3 g/dL (ref 6.4–8.3)

## 2016-10-15 LAB — CBC WITH DIFFERENTIAL/PLATELET
BASO%: 0.7 % (ref 0.0–2.0)
Basophils Absolute: 0.1 10*3/uL (ref 0.0–0.1)
EOS%: 1 % (ref 0.0–7.0)
Eosinophils Absolute: 0.1 10*3/uL (ref 0.0–0.5)
HCT: 37.8 % (ref 34.8–46.6)
HGB: 12.6 g/dL (ref 11.6–15.9)
LYMPH%: 58.3 % — ABNORMAL HIGH (ref 14.0–49.7)
MCH: 25.8 pg (ref 25.1–34.0)
MCHC: 33.3 g/dL (ref 31.5–36.0)
MCV: 77.7 fL — ABNORMAL LOW (ref 79.5–101.0)
MONO#: 0.5 10*3/uL (ref 0.1–0.9)
MONO%: 5 % (ref 0.0–14.0)
NEUT#: 3.2 10*3/uL (ref 1.5–6.5)
NEUT%: 35 % — ABNORMAL LOW (ref 38.4–76.8)
Platelets: 114 10*3/uL — ABNORMAL LOW (ref 145–400)
RBC: 4.86 10*6/uL (ref 3.70–5.45)
RDW: 16.3 % — ABNORMAL HIGH (ref 11.2–14.5)
WBC: 9.2 10*3/uL (ref 3.9–10.3)
lymph#: 5.4 10*3/uL — ABNORMAL HIGH (ref 0.9–3.3)

## 2016-10-15 LAB — TECHNOLOGIST REVIEW

## 2016-10-15 NOTE — Telephone Encounter (Signed)
Appointments scheduled per 10/15/16 los. Patient was given a copy of the appointment schedule and AVS report per 0/30/18 los. °

## 2016-10-15 NOTE — Progress Notes (Signed)
Hematology and Oncology Follow Up Visit  Briana Fitzgerald YI:[id number] 1932-09-06 83 y.o. 10/15/2016 12:02 PM Eulas Post, MDBurchette, [name], MD   Principle Diagnosis: 83 year old woman with lymphocytosis diagnosed in July 2017. Her peripheral blood flow cytometry indicated possible lymphoproliferative disorder. CLL versus monoclonal B-cell lymphocytosis are the possibilities.  Current therapy: Observation and surveillance.  Interim History: Briana Fitzgerald presents today for a follow-up visit. She is a pleasant woman I saw in consultation in July 2017. At that time she had mild lymphocytosis with normal white cell count. Her peripheral blood flow cytometry indicated a possible lymphoproliferative disorder. She is asymptomatic from these findings and a reports no major changes in her health. She is getting over a recent infection and upper respiratory tract symptoms. She denied any fevers or chills. Her weight has been fluctuating and gained couple pounds back after her illness. She denied any lymphadenopathy. She denied any abdominal pain or distention.  She does not report any headaches, blurry vision, syncope or seizures. She does not report any fevers or chills or sweats. He does not report any cough, wheezing or hemoptysis. She does not report any nausea, vomiting or abdominal pain. He does not report any frequency urgency or hesitancy. She does not report any skeletal complaints. Remaining review of systems unremarkable.   Medications: I have reviewed the patient's current medications.  Current Outpatient Prescriptions  Medication Sig Dispense Refill  . acetaminophen (TYLENOL) 500 MG tablet Take 500 mg by mouth every 6 (six) hours as needed.    . bisacodyl (DULCOLAX) 10 MG suppository Place 1 suppository (10 mg total) rectally as needed for moderate constipation. 12 suppository 0  . butalbital-aspirin-caffeine (FIORINAL) 50-325-40 MG capsule Take 1 capsule by mouth every 6 (six) hours as  needed for headache. 30 capsule 0  . Calcium Carbonate-Vitamin D (CALTRATE 600+D) 600-400 MG-UNIT tablet Take 1 tablet by mouth daily.    . DOCOSAHEXAENOIC ACID PO Take 1 g by mouth.    . docusate sodium (COLACE) 100 MG capsule Take 1 capsule (100 mg total) by mouth daily. 60 capsule 0  . lansoprazole (PREVACID) 30 MG capsule Take 1 capsule (30 mg total) by mouth daily at 12 noon. (Patient taking differently: Take 30 mg by mouth as needed. ) 60 capsule 3  . Multiple Vitamins-Minerals (PRESERVISION AREDS PO) Take 1 tablet by mouth daily.    . Omega-3 Fatty Acids (FISH OIL) 1200 MG CAPS Take 2 capsules by mouth daily.     . polyethylene glycol (MIRALAX / GLYCOLAX) packet Take 17 g by mouth daily.    . pramipexole (MIRAPEX) 0.5 MG tablet TAKE 1 TABLET BY MOUTH AT BEDTIME 90 tablet 1   No current facility-administered medications for this visit.      Allergies: No Known Allergies  Past Medical History, Surgical history, Social history, and Family History were reviewed and updated.   Physical Exam: Blood pressure (!) 130/51, pulse 82, temperature 97.5 F (36.4 C), temperature source Oral, resp. rate 18, height 5\' 4"  (1.626 m), weight 134 lb 6.4 oz (61 kg), SpO2 99 %. ECOG: 1 General appearance: alert and cooperative Head: Normocephalic, without obvious abnormality Neck: no adenopathy Lymph nodes: Cervical, supraclavicular, and axillary nodes normal. Heart:regular rate and rhythm, S1, S2 normal, no murmur, click, rub or gallop Lung:chest clear, no wheezing, rales, normal symmetric air entry Abdomin: soft, non-tender, without masses or organomegaly EXT:no erythema, induration, or nodules   Lab Results: Lab Results  Component Value Date   WBC 9.2 10/15/2016   HGB  12.6 10/15/2016   HCT 37.8 10/15/2016   MCV 77.7 (L) 10/15/2016   PLT 114 (L) 10/15/2016     Chemistry      Component Value Date/Time   NA 136 07/25/2016 1738   NA 139 04/04/2016 1117   K 4.1 07/25/2016 1738   K 4.3  04/04/2016 1117   CL 105 07/25/2016 1738   CO2 25 07/25/2016 1738   CO2 25 04/04/2016 1117   BUN 10 07/25/2016 1738   BUN 14.9 04/04/2016 1117   CREATININE 0.77 07/25/2016 1738   CREATININE 0.9 04/04/2016 1117      Component Value Date/Time   CALCIUM 10.2 07/25/2016 1738   CALCIUM 10.0 04/04/2016 1117   ALKPHOS 78 07/25/2016 1738   ALKPHOS 81 04/04/2016 1117   AST 33 07/25/2016 1738   AST 19 04/04/2016 1117   ALT 20 07/25/2016 1738   ALT 10 04/04/2016 1117   BILITOT 1.3 (H) 07/25/2016 1738   BILITOT 1.03 04/04/2016 1117        Impression and Plan:  83 year old woman with the following issues:  1. Lymphocytosis noted since 2015. Her CBC from today was reviewed and showed a total white cell count of 10.5 with lymphocytosis. Her lymphocyte percentage 67% with increase absolute lymphocyte count of 7.1. She had a normal hemoglobin and mild thrombocytopenia.  Her CBC was reviewed today and showed no major abnormalities. Her white cell count is normal with mild lymphocytosis. The differential diagnosis remains stage 0 CLL versus monoclonal B-cell lymphocytosis. I have discussed the natural course of this disease and likely she will not require any treatment any time soon. Indication for treatment were reviewed with the patient today. These would include rapid rise in her white cell count, worsening cytopenias or constitutional symptoms.  I have recommended continued observation and surveillance and repeat CBC in 6 months. I see no indications to warrant treatment at this time.   2.Thrombocytopenia: Remains stable and asymptomatic. We will continue to monitor at this time.  3. Follow-up: Will be in 6 months sooner if needed to.   Zola Button, MD 1/30/201812:02 PM

## 2016-10-16 ENCOUNTER — Telehealth: Payer: Self-pay | Admitting: *Deleted

## 2016-10-16 NOTE — Telephone Encounter (Signed)
Patient calling to request a copy of her c-met from yesterday's O.V. Mailed to patient's home.

## 2016-10-23 ENCOUNTER — Telehealth: Payer: Self-pay

## 2016-10-23 NOTE — Telephone Encounter (Signed)
Pt requested paperwork. Labs from July 2017, OV note from January 2018 and July 2017. These were mailed to pt.  Husband was most particularly interested in diagnosis and prognosis.

## 2016-10-31 DIAGNOSIS — Z23 Encounter for immunization: Secondary | ICD-10-CM | POA: Diagnosis not present

## 2016-12-11 ENCOUNTER — Other Ambulatory Visit: Payer: Self-pay | Admitting: Family Medicine

## 2017-01-08 DIAGNOSIS — D485 Neoplasm of uncertain behavior of skin: Secondary | ICD-10-CM | POA: Diagnosis not present

## 2017-01-08 DIAGNOSIS — C44622 Squamous cell carcinoma of skin of right upper limb, including shoulder: Secondary | ICD-10-CM | POA: Diagnosis not present

## 2017-01-08 DIAGNOSIS — L739 Follicular disorder, unspecified: Secondary | ICD-10-CM | POA: Diagnosis not present

## 2017-01-08 DIAGNOSIS — C44729 Squamous cell carcinoma of skin of left lower limb, including hip: Secondary | ICD-10-CM | POA: Diagnosis not present

## 2017-01-08 DIAGNOSIS — L570 Actinic keratosis: Secondary | ICD-10-CM | POA: Diagnosis not present

## 2017-01-08 DIAGNOSIS — L821 Other seborrheic keratosis: Secondary | ICD-10-CM | POA: Diagnosis not present

## 2017-01-16 DIAGNOSIS — L905 Scar conditions and fibrosis of skin: Secondary | ICD-10-CM | POA: Diagnosis not present

## 2017-01-16 DIAGNOSIS — Z85828 Personal history of other malignant neoplasm of skin: Secondary | ICD-10-CM | POA: Diagnosis not present

## 2017-01-16 DIAGNOSIS — C44729 Squamous cell carcinoma of skin of left lower limb, including hip: Secondary | ICD-10-CM | POA: Diagnosis not present

## 2017-02-06 ENCOUNTER — Telehealth: Payer: Self-pay | Admitting: Family Medicine

## 2017-02-06 NOTE — Telephone Encounter (Signed)
Pt state that she saw Dr. Alen Blew and state that it is okay for pt to see Dr. Elease Hashimoto again.  Pt is wanting to been seen by Dr. Elease Hashimoto or have blood work done may I have a order for this?

## 2017-02-07 NOTE — Telephone Encounter (Signed)
Set up follow up appt.

## 2017-02-07 NOTE — Telephone Encounter (Signed)
I called the pt and scheduled an appt for 6/27 per the pts request.

## 2017-02-11 DIAGNOSIS — R42 Dizziness and giddiness: Secondary | ICD-10-CM | POA: Diagnosis not present

## 2017-02-11 DIAGNOSIS — H838X3 Other specified diseases of inner ear, bilateral: Secondary | ICD-10-CM | POA: Diagnosis not present

## 2017-02-11 DIAGNOSIS — H903 Sensorineural hearing loss, bilateral: Secondary | ICD-10-CM | POA: Diagnosis not present

## 2017-02-19 ENCOUNTER — Other Ambulatory Visit (INDEPENDENT_AMBULATORY_CARE_PROVIDER_SITE_OTHER): Payer: Self-pay | Admitting: Otolaryngology

## 2017-02-19 DIAGNOSIS — H918X9 Other specified hearing loss, unspecified ear: Secondary | ICD-10-CM

## 2017-02-27 ENCOUNTER — Other Ambulatory Visit: Payer: Self-pay | Admitting: Family Medicine

## 2017-02-28 NOTE — Telephone Encounter (Signed)
Refill for 6 months. 

## 2017-03-03 ENCOUNTER — Ambulatory Visit
Admission: RE | Admit: 2017-03-03 | Discharge: 2017-03-03 | Disposition: A | Discharge status: Home / Self Care | Payer: Medicare Other | Source: Ambulatory Visit | Attending: Otolaryngology | Admitting: Otolaryngology

## 2017-03-03 DIAGNOSIS — H918X9 Other specified hearing loss, unspecified ear: Secondary | ICD-10-CM

## 2017-03-03 DIAGNOSIS — H9313 Tinnitus, bilateral: Secondary | ICD-10-CM | POA: Diagnosis not present

## 2017-03-03 MED ORDER — GADOBENATE DIMEGLUMINE 529 MG/ML IV SOLN
15.0000 mL | Freq: Once | INTRAVENOUS | Status: AC | PRN
  Administered 2017-03-03: 12 mL via INTRAVENOUS

## 2017-03-07 ENCOUNTER — Ambulatory Visit (INDEPENDENT_AMBULATORY_CARE_PROVIDER_SITE_OTHER): Payer: Medicare Other | Admitting: Family Medicine

## 2017-03-07 ENCOUNTER — Encounter: Payer: Self-pay | Admitting: Family Medicine

## 2017-03-07 VITALS — BP 118/58 | HR 74 | Temp 97.8°F | Wt 135.7 lb

## 2017-03-07 DIAGNOSIS — L0292 Furuncle, unspecified: Secondary | ICD-10-CM | POA: Diagnosis not present

## 2017-03-07 DIAGNOSIS — L03221 Cellulitis of neck: Secondary | ICD-10-CM

## 2017-03-07 MED ORDER — DOXYCYCLINE HYCLATE 100 MG PO TABS: 100.0000 mg | ORAL_TABLET | Freq: Two times a day (BID) | ORAL | 0 refills | Status: DC

## 2017-03-07 NOTE — Progress Notes (Signed)
HPI:  Acute visit for skin lesion: -L neck for several days -sore and redness around it spread a little -hx of cysts but she does not think this is a cyst, thinks infected -does not remember any bites -no fevers, malaise, chills   ROS: See pertinent positives and negatives per HPI.  Past Medical History:  Diagnosis Date  . Arthritis   . BACK PAIN 07/18/2008  . Bowel obstruction ([place])   . CONSTIPATION 10/02/2010  . GERD 10/02/2010  . Headache   . IBS (irritable bowel syndrome)   . LAMINECTOMY, LUMBAR, HX OF 06/16/2008  . OSTEOARTHRITIS 10/02/2010  . Pneumonia   . RESTLESS LEG SYNDROME 10/02/2010  . SACROILIAC JOINT DYSFUNCTION 06/16/2008  . Sleep apnea   . STYE 10/02/2010  . TRANSIENT ISCHEMIC ATTACKS, HX OF 10/02/2010    Past Surgical History:  Procedure Laterality Date  . ABDOMINAL HYSTERECTOMY  1979   TAH, fibroids  . [facility] [facility] [place]  2005  . APPENDECTOMY  1941  . EYE SURGERY  2005   catarac,both eyes  . LIVER BIOPSY  1997   benign, hemangioma resection  . SPINE SURGERY  2006   laminectomy    Family History  Problem Relation Age of Onset  . Stroke Father   . Diabetes Father        type ll  . Pancreatitis Brother   . Breast cancer Paternal Aunt        aunt  . Colon cancer Neg Hx   . Stomach cancer Neg Hx   . Rectal cancer Neg Hx   . Liver cancer Neg Hx   . Esophageal cancer Neg Hx     Social History   Social History  . Marital status: Married    Spouse name: N/A  . Number of children: 2  . Years of education: N/A   Occupational History  . retired    Social History Main Topics  . Smoking status: Never Smoker  . Smokeless tobacco: Never Used  . Alcohol use Yes     Comment: at dinner with friends, 2-3 per month - wine  . Drug use: No  . Sexual activity: Not Asked   Other Topics Concern  . None   Social History Narrative  . None     Current Outpatient Prescriptions:  .  acetaminophen (TYLENOL) 500 MG tablet, Take 500 mg by mouth  every 6 (six) hours as needed., Disp: , Rfl:  .  bisacodyl (DULCOLAX) 10 MG suppository, Place 1 suppository (10 mg total) rectally as needed for moderate constipation., Disp: 12 suppository, Rfl: 0 .  butalbital-aspirin-caffeine (FIORINAL) 50-325-40 MG capsule, Take 1 capsule by mouth every 6 (six) hours as needed for headache., Disp: 30 capsule, Rfl: 0 .  Calcium Carbonate-Vitamin D (CALTRATE 600+D) 600-400 MG-UNIT tablet, Take 1 tablet by mouth daily., Disp: , Rfl:  .  DOCOSAHEXAENOIC ACID PO, Take 1 g by mouth., Disp: , Rfl:  .  docusate sodium (COLACE) 100 MG capsule, Take 1 capsule (100 mg total) by mouth daily., Disp: 60 capsule, Rfl: 0 .  lansoprazole (PREVACID) 30 MG capsule, Take 1 capsule (30 mg total) by mouth daily at 12 noon. (Patient taking differently: Take 30 mg by mouth as needed. ), Disp: 60 capsule, Rfl: 3 .  Multiple Vitamins-Minerals (PRESERVISION AREDS PO), Take 1 tablet by mouth daily., Disp: , Rfl:  .  Omega-3 Fatty Acids (FISH OIL) 1200 MG CAPS, Take 2 capsules by mouth daily. , Disp: , Rfl:  .  polyethylene  glycol (MIRALAX / GLYCOLAX) packet, Take 17 g by mouth daily., Disp: , Rfl:  .  pramipexole (MIRAPEX) 0.5 MG tablet, TAKE 1 TABLET BY MOUTH AT BEDTIME, Disp: 90 tablet, Rfl: 1 .  doxycycline (VIBRA-TABS) 100 MG tablet, Take 1 tablet (100 mg total) by mouth 2 (two) times daily., Disp: 10 tablet, Rfl: 0  EXAM:  Vitals:   03/07/17 1343  BP: (!) 118/58  Pulse: 74  Temp: 97.8 F (36.6 C)    Body mass index is 23.29 kg/m.  GENERAL: vitals reviewed and listed above, alert, oriented, appears well hydrated and in no acute distress  HEENT: atraumatic, conjunttiva clear, no obvious abnormalities on inspection of external nose and ears  SKIN:small sub cm erythematous tender papule with central punctate L lateral neck, 4x5 cm area of surrounding erythema/mild warmth  MS: moves all extremities without noticeable abnormality  PSYCH: pleasant and cooperative, no  obvious depression or anxiety  ASSESSMENT AND PLAN:  Discussed the following assessment and plan:  Cellulitis of neck  Boil  -we discussed possible serious and likely etiologies, workup and treatment, treatment risks and return precautions - most likely an inflamed sebaceous  Cyst with surrounding inflammatory response vs cellulitis -after this discussion, Briana Fitzgerald opted for avoidance procedure, she as adamant about abx - will do doxy (risks discussed), compresses, return precautions if not improving or worsening -follow up advised - next week as reports already has CPE scheduled -of course, we advised Briana Fitzgerald  to return or notify a doctor immediately if symptoms worsen or persist or new concerns arise.   Patient Instructions  Take the medication as instructed.  Warm compresses several times daily.  I hope you are feeling better soon! Seek care immediately if worsening, new concerns or you are not improving with treatment.  Doxycycline tablets or capsules What is this medicine? DOXYCYCLINE (dox i SYE kleen) is a tetracycline antibiotic. It kills certain bacteria or stops their growth. It is used to treat many kinds of infections, like dental, skin, respiratory, and urinary tract infections. It also treats acne, Lyme disease, malaria, and certain sexually transmitted infections. This medicine may be used for other purposes; ask your health care provider or pharmacist if you have questions. COMMON BRAND NAME(S): Acticlate, Adoxa, Adoxa CK, Adoxa Pak, Adoxa TT, Alodox, Avidoxy, Doxal, Mondoxyne NL, Monodox, Morgidox 1x, Morgidox 1x Kit, Morgidox 2x, Morgidox 2x Kit, NutriDox, Ocudox, TARGADOX, Vibra-Tabs, Vibramycin What should I tell my health care provider before I take this medicine? They need to know if you have any of these conditions: -liver disease -long exposure to sunlight like working outdoors -stomach problems like colitis -an unusual or allergic reaction to doxycycline,  tetracycline antibiotics, other medicines, foods, dyes, or preservatives -pregnant or trying to get pregnant -breast-feeding How should I use this medicine? Take this medicine by mouth with a full glass of water. Follow the directions on the prescription label. It is best to take this medicine without food, but if it upsets your stomach take it with food. Take your medicine at regular intervals. Do not take your medicine more often than directed. Take all of your medicine as directed even if you think you are better. Do not skip doses or stop your medicine early. Talk to your pediatrician regarding the use of this medicine in children. While this drug may be prescribed for selected conditions, precautions do apply. Overdosage: If you think you have taken too much of this medicine contact a poison control center or emergency room at once. NOTE: This  medicine is only for you. Do not share this medicine with others. What if I miss a dose? If you miss a dose, take it as soon as you can. If it is almost time for your next dose, take only that dose. Do not take double or extra doses. What may interact with this medicine? -antacids -barbiturates -birth control pills -bismuth subsalicylate -carbamazepine -methoxyflurane -other antibiotics -phenytoin -vitamins that contain iron -warfarin This list may not describe all possible interactions. Give your health care provider a list of all the medicines, herbs, non-prescription drugs, or dietary supplements you use. Also tell them if you smoke, drink alcohol, or use illegal drugs. Some items may interact with your medicine. What should I watch for while using this medicine? Tell your doctor or health care professional if your symptoms do not improve. Do not treat diarrhea with over the counter products. Contact your doctor if you have diarrhea that lasts more than 2 days or if it is severe and watery. Do not take this medicine just before going to bed. It  may not dissolve properly when you lay down and can cause pain in your throat. Drink plenty of fluids while taking this medicine to also help reduce irritation in your throat. This medicine can make you more sensitive to the sun. Keep out of the sun. If you cannot avoid being in the sun, wear protective clothing and use sunscreen. Do not use sun lamps or tanning beds/booths. Birth control pills may not work properly while you are taking this medicine. Talk to your doctor about using an extra method of birth control. If you are being treated for a sexually transmitted infection, avoid sexual contact until you have finished your treatment. Your sexual partner may also need treatment. Avoid antacids, aluminum, calcium, magnesium, and iron products for 4 hours before and 2 hours after taking a dose of this medicine. If you are using this medicine to prevent malaria, you should still protect yourself from contact with mosquitos. Stay in screened-in areas, use mosquito nets, keep your body covered, and use an insect repellent. What side effects may I notice from receiving this medicine? Side effects that you should report to your doctor or health care professional as soon as possible: -allergic reactions like skin rash, itching or hives, swelling of the face, lips, or tongue -difficulty breathing -fever -itching in the rectal or genital area -pain on swallowing -redness, blistering, peeling or loosening of the skin, including inside the mouth -severe stomach pain or cramps -unusual bleeding or bruising -unusually weak or tired -yellowing of the eyes or skin Side effects that usually do not require medical attention (report to your doctor or health care professional if they continue or are bothersome): -diarrhea -loss of appetite -nausea, vomiting This list may not describe all possible side effects. Call your doctor for medical advice about side effects. You may report side effects to FDA at  1-800-FDA-1088. Where should I keep my medicine? Keep out of the reach of children. Store at room temperature, below 30 degrees C (86 degrees F). Protect from light. Keep container tightly closed. Throw away any unused medicine after the expiration date. Taking this medicine after the expiration date can make you seriously ill. NOTE: This sheet is a summary. It may not cover all possible information. If you have questions about this medicine, talk to your doctor, pharmacist, or health care provider.  2018 Elsevier/Gold Standard (2015-10-04 17:11:22)      Colin Benton R., DO

## 2017-03-07 NOTE — Patient Instructions (Signed)
Take the medication as instructed.  Warm compresses several times daily.  I hope you are feeling better soon! Seek care immediately if worsening, new concerns or you are not improving with treatment.  Doxycycline tablets or capsules What is this medicine? DOXYCYCLINE (dox i SYE kleen) is a tetracycline antibiotic. It kills certain bacteria or stops their growth. It is used to treat many kinds of infections, like dental, skin, respiratory, and urinary tract infections. It also treats acne, Lyme disease, malaria, and certain sexually transmitted infections. This medicine may be used for other purposes; ask your health care provider or pharmacist if you have questions. COMMON BRAND NAME(S): Acticlate, Adoxa, Adoxa CK, Adoxa Pak, Adoxa TT, Alodox, Avidoxy, Doxal, Mondoxyne NL, Monodox, Morgidox 1x, Morgidox 1x Kit, Morgidox 2x, Morgidox 2x Kit, NutriDox, Ocudox, TARGADOX, Vibra-Tabs, Vibramycin What should I tell my health care provider before I take this medicine? They need to know if you have any of these conditions: -liver disease -long exposure to sunlight like working outdoors -stomach problems like colitis -an unusual or allergic reaction to doxycycline, tetracycline antibiotics, other medicines, foods, dyes, or preservatives -pregnant or trying to get pregnant -breast-feeding How should I use this medicine? Take this medicine by mouth with a full glass of water. Follow the directions on the prescription label. It is best to take this medicine without food, but if it upsets your stomach take it with food. Take your medicine at regular intervals. Do not take your medicine more often than directed. Take all of your medicine as directed even if you think you are better. Do not skip doses or stop your medicine early. Talk to your pediatrician regarding the use of this medicine in children. While this drug may be prescribed for selected conditions, precautions do apply. Overdosage: If you think  you have taken too much of this medicine contact a poison control center or emergency room at once. NOTE: This medicine is only for you. Do not share this medicine with others. What if I miss a dose? If you miss a dose, take it as soon as you can. If it is almost time for your next dose, take only that dose. Do not take double or extra doses. What may interact with this medicine? -antacids -barbiturates -birth control pills -bismuth subsalicylate -carbamazepine -methoxyflurane -other antibiotics -phenytoin -vitamins that contain iron -warfarin This list may not describe all possible interactions. Give your health care provider a list of all the medicines, herbs, non-prescription drugs, or dietary supplements you use. Also tell them if you smoke, drink alcohol, or use illegal drugs. Some items may interact with your medicine. What should I watch for while using this medicine? Tell your doctor or health care professional if your symptoms do not improve. Do not treat diarrhea with over the counter products. Contact your doctor if you have diarrhea that lasts more than 2 days or if it is severe and watery. Do not take this medicine just before going to bed. It may not dissolve properly when you lay down and can cause pain in your throat. Drink plenty of fluids while taking this medicine to also help reduce irritation in your throat. This medicine can make you more sensitive to the sun. Keep out of the sun. If you cannot avoid being in the sun, wear protective clothing and use sunscreen. Do not use sun lamps or tanning beds/booths. Birth control pills may not work properly while you are taking this medicine. Talk to your doctor about using an extra method of  birth control. If you are being treated for a sexually transmitted infection, avoid sexual contact until you have finished your treatment. Your sexual partner may also need treatment. Avoid antacids, aluminum, calcium, magnesium, and iron  products for 4 hours before and 2 hours after taking a dose of this medicine. If you are using this medicine to prevent malaria, you should still protect yourself from contact with mosquitos. Stay in screened-in areas, use mosquito nets, keep your body covered, and use an insect repellent. What side effects may I notice from receiving this medicine? Side effects that you should report to your doctor or health care professional as soon as possible: -allergic reactions like skin rash, itching or hives, swelling of the face, lips, or tongue -difficulty breathing -fever -itching in the rectal or genital area -pain on swallowing -redness, blistering, peeling or loosening of the skin, including inside the mouth -severe stomach pain or cramps -unusual bleeding or bruising -unusually weak or tired -yellowing of the eyes or skin Side effects that usually do not require medical attention (report to your doctor or health care professional if they continue or are bothersome): -diarrhea -loss of appetite -nausea, vomiting This list may not describe all possible side effects. Call your doctor for medical advice about side effects. You may report side effects to FDA at 1-800-FDA-1088. Where should I keep my medicine? Keep out of the reach of children. Store at room temperature, below 30 degrees C (86 degrees F). Protect from light. Keep container tightly closed. Throw away any unused medicine after the expiration date. Taking this medicine after the expiration date can make you seriously ill. NOTE: This sheet is a summary. It may not cover all possible information. If you have questions about this medicine, talk to your doctor, pharmacist, or health care provider.  2018 Elsevier/Gold Standard (2015-10-04 17:11:22)

## 2017-03-11 DIAGNOSIS — H903 Sensorineural hearing loss, bilateral: Secondary | ICD-10-CM | POA: Diagnosis not present

## 2017-03-11 DIAGNOSIS — H838X3 Other specified diseases of inner ear, bilateral: Secondary | ICD-10-CM | POA: Diagnosis not present

## 2017-03-12 ENCOUNTER — Ambulatory Visit (INDEPENDENT_AMBULATORY_CARE_PROVIDER_SITE_OTHER): Payer: Medicare Other | Admitting: Family Medicine

## 2017-03-12 ENCOUNTER — Encounter: Payer: Self-pay | Admitting: Family Medicine

## 2017-03-12 VITALS — BP 100/58 | HR 78 | Temp 98.5°F | Wt 134.4 lb

## 2017-03-12 DIAGNOSIS — M810 Age-related osteoporosis without current pathological fracture: Secondary | ICD-10-CM | POA: Diagnosis not present

## 2017-03-12 DIAGNOSIS — L03221 Cellulitis of neck: Secondary | ICD-10-CM | POA: Diagnosis not present

## 2017-03-12 DIAGNOSIS — D72829 Elevated white blood cell count, unspecified: Secondary | ICD-10-CM

## 2017-03-12 NOTE — Patient Instructions (Signed)

## 2017-03-12 NOTE — Progress Notes (Signed)
Subjective:     Patient ID: Briana Fitzgerald, female   DOB: [birth year], 83 y.o.   MRN: [id number]  HPI Patient here to discuss multiple issues  First, she had cellulitis left side of neck. Possible bite. Was seen here last week and placed on doxycycline. Redness has improved greatly. She still has some mild soreness. No fevers or chills. One more day of doxycycline. No pustules.  Osteoporosis history. She tried Fosamax but had increased reflux symptoms. She is interested in discussing other therapies. Has never been tried on injectable therapies.  She has history of increased lymphocytosis. Followed by hematology. She is asking that we do follow-up labs. Will be due in July for six-month follow-up. She has some chronic thrombus cytopenia which is been stable. Her last CBC white count was normal but still has increased lymphocytes. Was felt that she probably has either stage 0 chronic lymphocytic leukemia or possibly monoclonal B-cell lymphocytosis  Past Medical History:  Diagnosis Date  . Arthritis   . BACK PAIN 07/18/2008  . Bowel obstruction ([place])   . CONSTIPATION 10/02/2010  . GERD 10/02/2010  . Headache   . IBS (irritable bowel syndrome)   . LAMINECTOMY, LUMBAR, HX OF 06/16/2008  . OSTEOARTHRITIS 10/02/2010  . Pneumonia   . RESTLESS LEG SYNDROME 10/02/2010  . SACROILIAC JOINT DYSFUNCTION 06/16/2008  . Sleep apnea   . STYE 10/02/2010  . TRANSIENT ISCHEMIC ATTACKS, HX OF 10/02/2010   Past Surgical History:  Procedure Laterality Date  . ABDOMINAL HYSTERECTOMY  1979   TAH, fibroids  . [facility]OG[place] / [place]  2005  . APPENDECTOMY  1941  . EYE SURGERY  2005   catarac,both eyes  . LIVER BIOPSY  1997   benign, hemangioma resection  . SPINE SURGERY  2006   laminectomy    reports that she has never smoked. She has never used smokeless tobacco. She reports that she drinks alcohol. She reports that she does not use drugs. family history includes Breast cancer in her paternal aunt; Diabetes  in her father; Pancreatitis in her brother; Stroke in her father. No Known Allergies   Review of Systems  Constitutional: Positive for fatigue. Negative for chills and fever.  Eyes: Negative for visual disturbance.  Respiratory: Negative for cough, chest tightness, shortness of breath and wheezing.   Cardiovascular: Negative for chest pain, palpitations and leg swelling.  Neurological: Negative for dizziness, seizures, syncope, weakness, light-headedness and headaches.       Objective:   Physical Exam  Constitutional: She appears well-developed and well-nourished.  Eyes: Pupils are equal, round, and reactive to light.  Neck: Neck supple. No JVD present. No thyromegaly present.  Cardiovascular: Normal rate and regular rhythm.  Exam reveals no gallop.   Pulmonary/Chest: Effort normal and breath sounds normal. No respiratory distress. She has no wheezes. She has no rales.  Musculoskeletal: She exhibits no edema.  Neurological: She is alert.  Skin:  Patient has small half centimeter erythematous papule left side of neck which is indurated and nonfluctuant. No pustules. Minimally tender. Surrounding cellulitis changes have improved       Assessment:     #1 resolving cellulitis left neck  #2 osteoporosis with prior intolerance with oral bisphosphonate  #3 history of lymphocytosis-? Monoclonal B-cell lymphocytosis versus early CLL    Plan:     -Recheck CBC in one month -Discussed potentially starting Prolia injections-will need to look getting prior authorization first Would also need basic metabolic panel prior to initiating -Finish out doxycycline -Routine office follow-up  in 6 months  Eulas Post MD [facility] at [facility]

## 2017-03-25 DIAGNOSIS — L821 Other seborrheic keratosis: Secondary | ICD-10-CM | POA: Diagnosis not present

## 2017-03-25 DIAGNOSIS — D1801 Hemangioma of skin and subcutaneous tissue: Secondary | ICD-10-CM | POA: Diagnosis not present

## 2017-03-25 DIAGNOSIS — L814 Other melanin hyperpigmentation: Secondary | ICD-10-CM | POA: Diagnosis not present

## 2017-03-25 DIAGNOSIS — Z85828 Personal history of other malignant neoplasm of skin: Secondary | ICD-10-CM | POA: Diagnosis not present

## 2017-03-25 DIAGNOSIS — D225 Melanocytic nevi of trunk: Secondary | ICD-10-CM | POA: Diagnosis not present

## 2017-03-25 DIAGNOSIS — C4441 Basal cell carcinoma of skin of scalp and neck: Secondary | ICD-10-CM | POA: Diagnosis not present

## 2017-03-25 DIAGNOSIS — D485 Neoplasm of uncertain behavior of skin: Secondary | ICD-10-CM | POA: Diagnosis not present

## 2017-04-01 ENCOUNTER — Telehealth: Payer: Self-pay | Admitting: Family Medicine

## 2017-04-01 NOTE — Telephone Encounter (Signed)
Pt is calling checking on PA for prolia injection

## 2017-04-03 ENCOUNTER — Other Ambulatory Visit (INDEPENDENT_AMBULATORY_CARE_PROVIDER_SITE_OTHER): Payer: Medicare Other

## 2017-04-03 ENCOUNTER — Ambulatory Visit (INDEPENDENT_AMBULATORY_CARE_PROVIDER_SITE_OTHER): Payer: Medicare Other | Admitting: Family Medicine

## 2017-04-03 ENCOUNTER — Encounter: Payer: Self-pay | Admitting: Family Medicine

## 2017-04-03 VITALS — BP 104/48 | HR 71 | Temp 98.4°F | Ht 64.0 in | Wt 137.2 lb

## 2017-04-03 DIAGNOSIS — D72829 Elevated white blood cell count, unspecified: Secondary | ICD-10-CM | POA: Diagnosis not present

## 2017-04-03 DIAGNOSIS — L989 Disorder of the skin and subcutaneous tissue, unspecified: Secondary | ICD-10-CM

## 2017-04-03 LAB — CBC WITH DIFFERENTIAL/PLATELET
Basophils Absolute: 0.1 10*3/uL (ref 0.0–0.1)
Basophils Relative: 0.6 % (ref 0.0–3.0)
Eosinophils Absolute: 0.1 10*3/uL (ref 0.0–0.7)
Eosinophils Relative: 1 % (ref 0.0–5.0)
HCT: 34.4 % — ABNORMAL LOW (ref 36.0–46.0)
Hemoglobin: 11.5 g/dL — ABNORMAL LOW (ref 12.0–15.0)
Lymphocytes Relative: 68.8 % — ABNORMAL HIGH (ref 12.0–46.0)
Lymphs Abs: 5.6 10*3/uL — ABNORMAL HIGH (ref 0.7–4.0)
MCHC: 33.3 g/dL (ref 30.0–36.0)
MCV: 77.1 fl — ABNORMAL LOW (ref 78.0–100.0)
Monocytes Absolute: 0.3 10*3/uL (ref 0.1–1.0)
Monocytes Relative: 3.8 % (ref 3.0–12.0)
Neutro Abs: 2.1 10*3/uL (ref 1.4–7.7)
Neutrophils Relative %: 25.8 % — ABNORMAL LOW (ref 43.0–77.0)
Platelets: 115 10*3/uL — ABNORMAL LOW (ref 150.0–400.0)
RBC: 4.46 Mil/uL (ref 3.87–5.11)
RDW: 16.8 % — ABNORMAL HIGH (ref 11.5–15.5)
WBC: 8.2 10*3/uL (ref 4.0–10.5)

## 2017-04-03 NOTE — Progress Notes (Signed)
HPI:  Acute visit for skin lesion: -L neck -started acutely a few weeks ago - treated with doxy and compresses as was quite large and inflamed and has improved dramatically -she has seen her PCP (whom felt it looked good) then her dermatologist whom advised a biopsy - she apparently refused -a little itchy -hx BCC and she is worried about this -she has appt with her dermatologist in a few weeks -no drainage, swelling, worsening - actually continues to improve  ROS: See pertinent positives and negatives per HPI.  Past Medical History:  Diagnosis Date  . Arthritis   . BACK PAIN 07/18/2008  . Bowel obstruction ([place])   . CONSTIPATION 10/02/2010  . GERD 10/02/2010  . Headache   . IBS (irritable bowel syndrome)   . LAMINECTOMY, LUMBAR, HX OF 06/16/2008  . OSTEOARTHRITIS 10/02/2010  . Pneumonia   . RESTLESS LEG SYNDROME 10/02/2010  . SACROILIAC JOINT DYSFUNCTION 06/16/2008  . Sleep apnea   . STYE 10/02/2010  . TRANSIENT ISCHEMIC ATTACKS, HX OF 10/02/2010    Past Surgical History:  Procedure Laterality Date  . ABDOMINAL HYSTERECTOMY  1979   TAH, fibroids  . [place]OG[place] / [place]  2005  . APPENDECTOMY  1941  . EYE SURGERY  2005   catarac,both eyes  . LIVER BIOPSY  1997   benign, hemangioma resection  . SPINE SURGERY  2006   laminectomy    Family History  Problem Relation Age of Onset  . Stroke Father   . Diabetes Father        type ll  . Pancreatitis Brother   . Breast cancer Paternal Aunt        aunt  . Colon cancer Neg Hx   . Stomach cancer Neg Hx   . Rectal cancer Neg Hx   . Liver cancer Neg Hx   . Esophageal cancer Neg Hx     Social History   Social History  . Marital status: Married    Spouse name: N/A  . Number of children: 2  . Years of education: N/A   Occupational History  . retired    Social History Main Topics  . Smoking status: Never Smoker  . Smokeless tobacco: Never Used  . Alcohol use Yes     Comment: at dinner with friends, 2-3 per month -  wine  . Drug use: No  . Sexual activity: Not Asked   Other Topics Concern  . None   Social History Narrative  . None     Current Outpatient Prescriptions:  .  acetaminophen (TYLENOL) 500 MG tablet, Take 500 mg by mouth every 6 (six) hours as needed., Disp: , Rfl:  .  butalbital-aspirin-caffeine (FIORINAL) 50-325-40 MG capsule, Take 1 capsule by mouth every 6 (six) hours as needed for headache., Disp: 30 capsule, Rfl: 0 .  Calcium Carbonate-Vitamin D (CALTRATE 600+D) 600-400 MG-UNIT tablet, Take 1 tablet by mouth daily., Disp: , Rfl:  .  DOCOSAHEXAENOIC ACID PO, Take 1 g by mouth., Disp: , Rfl:  .  docusate sodium (COLACE) 100 MG capsule, Take 1 capsule (100 mg total) by mouth daily., Disp: 60 capsule, Rfl: 0 .  lansoprazole (PREVACID) 30 MG capsule, Take 1 capsule (30 mg total) by mouth daily at 12 noon. (Patient taking differently: Take 30 mg by mouth as needed. ), Disp: 60 capsule, Rfl: 3 .  Multiple Vitamins-Minerals (PRESERVISION AREDS PO), Take 1 tablet by mouth daily., Disp: , Rfl:  .  Omega-3 Fatty Acids (FISH OIL) 1200 MG CAPS,  Take 2 capsules by mouth daily. , Disp: , Rfl:  .  polyethylene glycol (MIRALAX / GLYCOLAX) packet, Take 17 g by mouth daily as needed. , Disp: , Rfl:  .  pramipexole (MIRAPEX) 0.5 MG tablet, TAKE 1 TABLET BY MOUTH AT BEDTIME, Disp: 90 tablet, Rfl: 1  EXAM:  Vitals:   04/03/17 1707  BP: (!) 104/48  Pulse: 71  Temp: 98.4 F (36.9 C)    Body mass index is 23.55 kg/m.  GENERAL: vitals reviewed and listed above, alert, oriented, appears well hydrated and in no acute distress  HEENT: atraumatic, conjunttiva clear, no obvious abnormalities on inspection of external nose and ears  NECK: no obvious masses on inspection  SKIN: small sub cm hyperpig papule L neck with very mild surrounding mild erythema and mild overlying scale  MS: moves all extremities without noticeable abnormality  PSYCH: pleasant and cooperative, no obvious depression or  anxiety  ASSESSMENT AND PLAN:  Discussed the following assessment and plan:  Skin lesion  -dramatic improvement from a few weeks ago, no signs infection today -seems to be healing -advised Aquaphor and a little hct cream for itch and follow up to recheck this area with derm in a few weeks as planned given hx BCC - less likely given acute onset and dramatic response to doxy/compresses -Patient advised to return or notify a doctor immediately if symptoms worsen or persist or new concerns arise.  Declined AVS  There are no Patient Instructions on file for this visit.  Colin Benton R., DO

## 2017-04-07 NOTE — Telephone Encounter (Signed)
Patient has been submitted. Awaiting approval from [employer]. It can take 5-7 business days to get response back

## 2017-04-15 ENCOUNTER — Other Ambulatory Visit: Payer: Medicare Other

## 2017-04-15 ENCOUNTER — Ambulatory Visit: Payer: Medicare Other | Admitting: Oncology

## 2017-04-16 DIAGNOSIS — H353132 Nonexudative age-related macular degeneration, bilateral, intermediate dry stage: Secondary | ICD-10-CM | POA: Diagnosis not present

## 2017-04-16 DIAGNOSIS — Z947 Corneal transplant status: Secondary | ICD-10-CM | POA: Diagnosis not present

## 2017-04-25 DIAGNOSIS — L720 Epidermal cyst: Secondary | ICD-10-CM | POA: Diagnosis not present

## 2017-04-25 DIAGNOSIS — C4441 Basal cell carcinoma of skin of scalp and neck: Secondary | ICD-10-CM | POA: Diagnosis not present

## 2017-04-29 NOTE — Telephone Encounter (Signed)
Pt calling to check the status of the prolia she is aware that Roselyn Reef is working on it and will give her a call when it is approved.

## 2017-05-01 NOTE — Telephone Encounter (Signed)
Spoke with patient today and let her know where we are in the process

## 2017-05-01 NOTE — Telephone Encounter (Signed)
Spoke yesterday with [employer] who had not been able to process her claim as they could not verify her insurance(s). Provided verbal confirmation of all insurance information yesterday and they are supposed to be processing this.

## 2017-05-13 ENCOUNTER — Telehealth: Payer: Self-pay | Admitting: Family Medicine

## 2017-05-13 NOTE — Telephone Encounter (Signed)
Pt would like to talk with jamie concerning prolia injection. Pt is aware jamie not in office today

## 2017-05-14 NOTE — Telephone Encounter (Signed)
Pt is calling back to talk with [facility]

## 2017-05-23 NOTE — Telephone Encounter (Signed)
Pt came in office today and was informed that her insurance has not approved Prolia yet. We are still waiting determination

## 2017-06-03 DIAGNOSIS — Z23 Encounter for immunization: Secondary | ICD-10-CM | POA: Diagnosis not present

## 2017-06-05 ENCOUNTER — Encounter: Payer: Self-pay | Admitting: Family Medicine

## 2017-06-12 ENCOUNTER — Telehealth: Payer: Self-pay

## 2017-06-12 NOTE — Telephone Encounter (Signed)
Spoke with patient and informed her that we do not have approval from her insurance at this time for her Prolia injection. We are hoping to have it back today as we escalated the submission yesterday with the representative Merry Proud. We are hoping to hear back today.

## 2017-06-18 DIAGNOSIS — D485 Neoplasm of uncertain behavior of skin: Secondary | ICD-10-CM | POA: Diagnosis not present

## 2017-06-19 DIAGNOSIS — Z947 Corneal transplant status: Secondary | ICD-10-CM | POA: Diagnosis not present

## 2017-06-19 DIAGNOSIS — H353132 Nonexudative age-related macular degeneration, bilateral, intermediate dry stage: Secondary | ICD-10-CM | POA: Diagnosis not present

## 2017-06-19 DIAGNOSIS — Z961 Presence of intraocular lens: Secondary | ICD-10-CM | POA: Diagnosis not present

## 2017-06-25 ENCOUNTER — Telehealth: Payer: Self-pay | Admitting: Family Medicine

## 2017-06-25 NOTE — Telephone Encounter (Signed)
Should patient hold off on the Prolia for now?

## 2017-06-25 NOTE — Telephone Encounter (Signed)
Pt states the insurance company they have always had, [employer] through her husbands retirement, has decided they will no longer provide coverage for them. Pt is supposed to start the prolia next week, and is concerned she will not be able to afford any future shots because they do not know what insurance they might have. Her question is, should she even start the prolia at this time?

## 2017-06-26 NOTE — Telephone Encounter (Signed)
I called the pt and informed her of the message below and advised her the follow up is scheduled for 12/28.  I cancelled the nurse visit appt for 10/15 and the pt is aware of this.

## 2017-06-26 NOTE — Telephone Encounter (Signed)
I would hold on starting the Prolia at this time.  We can discuss further at follow up.

## 2017-06-30 ENCOUNTER — Ambulatory Visit: Payer: Medicare Other

## 2017-07-02 ENCOUNTER — Other Ambulatory Visit: Payer: Self-pay

## 2017-07-02 NOTE — Progress Notes (Signed)
Refill Prevacid for 6 months.  Refill Fiorinal once.

## 2017-07-03 MED ORDER — BUTALBITAL-ASPIRIN-CAFFEINE 50-325-40 MG PO CAPS: 1.0000 | ORAL_CAPSULE | Freq: Four times a day (QID) | ORAL | 0 refills | Status: DC | PRN

## 2017-07-03 MED ORDER — LANSOPRAZOLE 30 MG PO CPDR: 30.0000 mg | DELAYED_RELEASE_CAPSULE | Freq: Every day | ORAL | 5 refills | Status: DC

## 2017-07-03 NOTE — Progress Notes (Signed)
Rxs done. 

## 2017-07-29 ENCOUNTER — Other Ambulatory Visit: Payer: Self-pay

## 2017-07-29 DIAGNOSIS — D485 Neoplasm of uncertain behavior of skin: Secondary | ICD-10-CM | POA: Diagnosis not present

## 2017-07-29 DIAGNOSIS — D2339 Other benign neoplasm of skin of other parts of face: Secondary | ICD-10-CM | POA: Diagnosis not present

## 2017-08-01 DIAGNOSIS — M8589 Other specified disorders of bone density and structure, multiple sites: Secondary | ICD-10-CM | POA: Diagnosis not present

## 2017-08-01 DIAGNOSIS — M810 Age-related osteoporosis without current pathological fracture: Secondary | ICD-10-CM | POA: Diagnosis not present

## 2017-08-01 LAB — HM DEXA SCAN

## 2017-08-20 ENCOUNTER — Other Ambulatory Visit: Payer: Self-pay | Admitting: *Deleted

## 2017-08-20 MED ORDER — PRAMIPEXOLE DIHYDROCHLORIDE 0.5 MG PO TABS: 0.5000 mg | ORAL_TABLET | Freq: Every day | ORAL | 0 refills | Status: DC

## 2017-08-21 ENCOUNTER — Other Ambulatory Visit: Payer: Self-pay

## 2017-09-01 ENCOUNTER — Ambulatory Visit (INDEPENDENT_AMBULATORY_CARE_PROVIDER_SITE_OTHER): Payer: Medicare Other | Admitting: Family Medicine

## 2017-09-01 VITALS — BP 120/48 | HR 72 | Temp 97.7°F | Wt 139.2 lb

## 2017-09-01 DIAGNOSIS — D696 Thrombocytopenia, unspecified: Secondary | ICD-10-CM

## 2017-09-01 DIAGNOSIS — Z862 Personal history of diseases of the blood and blood-forming organs and certain disorders involving the immune mechanism: Secondary | ICD-10-CM | POA: Diagnosis not present

## 2017-09-01 DIAGNOSIS — R5383 Other fatigue: Secondary | ICD-10-CM | POA: Diagnosis not present

## 2017-09-01 DIAGNOSIS — G2581 Restless legs syndrome: Secondary | ICD-10-CM

## 2017-09-01 DIAGNOSIS — K5900 Constipation, unspecified: Secondary | ICD-10-CM | POA: Diagnosis not present

## 2017-09-01 DIAGNOSIS — K219 Gastro-esophageal reflux disease without esophagitis: Secondary | ICD-10-CM | POA: Diagnosis not present

## 2017-09-01 LAB — CBC WITH DIFFERENTIAL/PLATELET
Basophils Absolute: 0 10*3/uL (ref 0.0–0.1)
Basophils Relative: 0.3 % (ref 0.0–3.0)
Eosinophils Absolute: 0.1 10*3/uL (ref 0.0–0.7)
Eosinophils Relative: 1.2 % (ref 0.0–5.0)
HCT: 35.1 % — ABNORMAL LOW (ref 36.0–46.0)
Hemoglobin: 11.7 g/dL — ABNORMAL LOW (ref 12.0–15.0)
Lymphocytes Relative: 57.5 % — ABNORMAL HIGH (ref 12.0–46.0)
Lymphs Abs: 4.5 10*3/uL — ABNORMAL HIGH (ref 0.7–4.0)
MCHC: 33.4 g/dL (ref 30.0–36.0)
MCV: 77.8 fl — ABNORMAL LOW (ref 78.0–100.0)
Monocytes Absolute: 0.3 10*3/uL (ref 0.1–1.0)
Monocytes Relative: 4.3 % (ref 3.0–12.0)
Neutro Abs: 2.9 10*3/uL (ref 1.4–7.7)
Neutrophils Relative %: 36.7 % — ABNORMAL LOW (ref 43.0–77.0)
Platelets: 120 10*3/uL — ABNORMAL LOW (ref 150.0–400.0)
RBC: 4.51 Mil/uL (ref 3.87–5.11)
RDW: 16.5 % — ABNORMAL HIGH (ref 11.5–15.5)
WBC: 7.8 10*3/uL (ref 4.0–10.5)

## 2017-09-01 LAB — COMPREHENSIVE METABOLIC PANEL
ALT: 20 U/L (ref 0–35)
AST: 27 U/L (ref 0–37)
Albumin: 3.8 g/dL (ref 3.5–5.2)
Alkaline Phosphatase: 68 U/L (ref 39–117)
BUN: 16 mg/dL (ref 6–23)
CO2: 28 mEq/L (ref 19–32)
Calcium: 9.6 mg/dL (ref 8.4–10.5)
Chloride: 107 mEq/L (ref 96–112)
Creatinine, Ser: 1 mg/dL (ref 0.40–1.20)
GFR: 56.19 mL/min — ABNORMAL LOW (ref >60.00)
Glucose, Bld: 93 mg/dL (ref 70–99)
Potassium: 4.3 mEq/L (ref 3.5–5.1)
Sodium: 139 mEq/L (ref 135–145)
Total Bilirubin: 1.1 mg/dL (ref 0.2–1.2)
Total Protein: 6.9 g/dL (ref 6.0–8.3)

## 2017-09-01 NOTE — Progress Notes (Signed)
Subjective:     Patient ID: Briana Fitzgerald, female   DOB: 03-Jan-1932, 83 y.o.   MRN: [id number]  HPI Patient here to follow multiple items  Restless leg syndrome. She takes Mirapex which controls her symptoms fairly well at night. She has some daytime symptoms and usually gets up to walk and that helps. She is reluctant to add additional medication this time. She does have history of mild anemia by her most recent CBC. She tries to minimize caffeine intake.  History of GERD. Fairly well controlled with Prevacid. No appetite or weight changes. No dysphagia.  Progressive varicose veins over several years. She has support hose but does not wear these regularly. She has intermittent problems with constipation. She has found that taking prune juice seems to work better than MiraLAX.  Past Medical History:  Diagnosis Date  . Arthritis   . BACK PAIN 07/18/2008  . Bowel obstruction ([place])   . CONSTIPATION 10/02/2010  . GERD 10/02/2010  . Headache   . IBS (irritable bowel syndrome)   . LAMINECTOMY, LUMBAR, HX OF 06/16/2008  . OSTEOARTHRITIS 10/02/2010  . Pneumonia   . RESTLESS LEG SYNDROME 10/02/2010  . SACROILIAC JOINT DYSFUNCTION 06/16/2008  . Sleep apnea   . STYE 10/02/2010  . TRANSIENT ISCHEMIC ATTACKS, HX OF 10/02/2010   Past Surgical History:  Procedure Laterality Date  . ABDOMINAL HYSTERECTOMY  1979   TAH, fibroids  . [place] / [place]  2005  . APPENDECTOMY  1941  . EYE SURGERY  2005   catarac,both eyes  . LIVER BIOPSY  1997   benign, hemangioma resection  . SPINE SURGERY  2006   laminectomy    reports that  has never smoked. she has never used smokeless tobacco. She reports that she drinks alcohol. She reports that she does not use drugs. family history includes Breast cancer in her paternal aunt; Diabetes in her father; Pancreatitis in her brother; Stroke in her father. No Known Allergies]   Review of Systems  Constitutional: Positive for fatigue. Negative for appetite  change, fever and unexpected weight change.  Eyes: Negative for visual disturbance.  Respiratory: Negative for cough, chest tightness, shortness of breath and wheezing.   Cardiovascular: Negative for chest pain, palpitations and leg swelling.  Gastrointestinal: Positive for constipation.  Genitourinary: Negative for dysuria.  Neurological: Negative for dizziness, seizures, syncope, weakness, light-headedness and headaches.       Objective:   Physical Exam  Constitutional: She appears well-developed and well-nourished.  Eyes: Pupils are equal, round, and reactive to light.  Neck: Neck supple. No JVD present. No thyromegaly present.  Cardiovascular: Normal rate and regular rhythm. Exam reveals no gallop.  Pulmonary/Chest: Effort normal and breath sounds normal. No respiratory distress. She has no wheezes. She has no rales.  Musculoskeletal:  She has some varicosities in lower legs bilaterally. No pitting edema.  Neurological: She is alert.       Assessment:     #1 restless leg syndrome stable on low-dose Mirapex at night  #2 GERD stable on Prevacid  #3 intermittent constipation  #4 varicosities veins lower extremities    Plan:     -Recheck labs with CBC and comprehensive metabolic panel -Continue nonpharmacologic ways of managing constipation as much as possible -Recommend she wear support hose more regularly -Continue current medications. Reassess in 6 months -Handout on fall prevention given  Eulas Post MD [facility]

## 2017-09-01 NOTE — Patient Instructions (Signed)
Fall Prevention in the Home Falls can cause injuries and can affect people from all age groups. There are many simple things that you can do to make your home safe and to help prevent falls. What can I do on the outside of my home?  Regularly repair the edges of walkways and driveways and fix any cracks.  Remove high doorway thresholds.  Trim any shrubbery on the main path into your home.  Use bright outdoor lighting.  Clear walkways of debris and clutter, including tools and rocks.  Regularly check that handrails are securely fastened and in good repair. Both sides of any steps should have handrails.  Install guardrails along the edges of any raised decks or porches.  Have leaves, snow, and ice cleared regularly.  Use sand or salt on walkways during winter months.  In the garage, clean up any spills right away, including grease or oil spills. What can I do in the bathroom?  Use night lights.  Install grab bars by the toilet and in the tub and shower. Do not use towel bars as grab bars.  Use non-skid mats or decals on the floor of the tub or shower.  If you need to sit down while you are in the shower, use a plastic, non-slip stool.  Keep the floor dry. Immediately clean up any water that spills on the floor.  Remove soap buildup in the tub or shower on a regular basis.  Attach bath mats securely with double-sided non-slip rug tape.  Remove throw rugs and other tripping hazards from the floor. What can I do in the bedroom?  Use night lights.  Make sure that a bedside light is easy to reach.  Do not use oversized bedding that drapes onto the floor.  Have a firm chair that has side arms to use for getting dressed.  Remove throw rugs and other tripping hazards from the floor. What can I do in the kitchen?  Clean up any spills right away.  Avoid walking on wet floors.  Place frequently used items in easy-to-reach places.  If you need to reach for something above  you, use a sturdy step stool that has a grab bar.  Keep electrical cables out of the way.  Do not use floor polish or wax that makes floors slippery. If you have to use wax, make sure that it is non-skid floor wax.  Remove throw rugs and other tripping hazards from the floor. What can I do in the stairways?  Do not leave any items on the stairs.  Make sure that there are handrails on both sides of the stairs. Fix handrails that are broken or loose. Make sure that handrails are as long as the stairways.  Check any carpeting to make sure that it is firmly attached to the stairs. Fix any carpet that is loose or worn.  Avoid having throw rugs at the top or bottom of stairways, or secure the rugs with carpet tape to prevent them from moving.  Make sure that you have a light switch at the top of the stairs and the bottom of the stairs. If you do not have them, have them installed. What are some other fall prevention tips?  Wear closed-toe shoes that fit well and support your feet. Wear shoes that have rubber soles or low heels.  When you use a stepladder, make sure that it is completely opened and that the sides are firmly locked. Have someone hold the ladder while you are using   it. Do not climb a closed stepladder.  Add color or contrast paint or tape to grab bars and handrails in your home. Place contrasting color strips on the first and last steps.  Use mobility aids as needed, such as canes, walkers, scooters, and crutches.  Turn on lights if it is dark. Replace any light bulbs that burn out.  Set up furniture so that there are clear paths. Keep the furniture in the same spot.  Fix any uneven floor surfaces.  Choose a carpet design that does not hide the edge of steps of a stairway.  Be aware of any and all pets.  Review your medicines with your healthcare provider. Some medicines can cause dizziness or changes in blood pressure, which increase your risk of falling. Talk with  your health care provider about other ways that you can decrease your risk of falls. This may include working with a physical therapist or trainer to improve your strength, balance, and endurance. This information is not intended to replace advice given to you by your health care provider. Make sure you discuss any questions you have with your health care provider. Document Released: 08/23/2002 Document Revised: 01/30/2016 Document Reviewed: 10/07/2014 Elsevier Interactive Patient Education  2017 Elsevier Inc.  

## 2017-09-12 ENCOUNTER — Ambulatory Visit: Payer: Medicare Other | Admitting: Family Medicine

## 2017-09-15 ENCOUNTER — Telehealth: Payer: Self-pay | Admitting: Family Medicine

## 2017-09-15 NOTE — Telephone Encounter (Signed)
Copied from [place] [phone]. Topic: Quick Communication - See Telephone Encounter >> Sep 15, 2017  1:39 PM Hewitt Shorts wrote: CRM for notification. See Telephone encounter for: pt is wanting to talk with someone about her lab results and  have a copy sent to her home  Best number   09/15/17.

## 2017-09-17 NOTE — Telephone Encounter (Signed)
Pt called again - she would like to have call regarding labs. cb # [phone] She would also like to have copy of labs

## 2017-09-18 ENCOUNTER — Telehealth: Payer: Self-pay | Admitting: *Deleted

## 2017-09-18 ENCOUNTER — Encounter: Payer: Self-pay | Admitting: Family Medicine

## 2017-09-18 NOTE — Telephone Encounter (Signed)
I reviewed patient's lab results with her 08/1717.  An office visit was offered, but the patient declined at this time.  Patient would like to know if her platelet count is too low?

## 2017-09-18 NOTE — Telephone Encounter (Signed)
Spoke with patient and copy ready for pick up

## 2017-09-19 NOTE — Telephone Encounter (Signed)
Her platelets are obviously low- but stable c/w multiple prior labs

## 2017-09-22 NOTE — Telephone Encounter (Signed)
Patient is aware 

## 2017-10-31 ENCOUNTER — Other Ambulatory Visit: Payer: Self-pay | Admitting: *Deleted

## 2017-10-31 MED ORDER — LANSOPRAZOLE 30 MG PO CPDR: 30.0000 mg | DELAYED_RELEASE_CAPSULE | Freq: Every day | ORAL | 0 refills | Status: DC

## 2017-11-14 ENCOUNTER — Other Ambulatory Visit: Payer: Self-pay | Admitting: *Deleted

## 2017-11-14 MED ORDER — PRAMIPEXOLE DIHYDROCHLORIDE 0.5 MG PO TABS: 0.5000 mg | ORAL_TABLET | Freq: Every day | ORAL | 0 refills | Status: DC

## 2017-11-18 DIAGNOSIS — G4733 Obstructive sleep apnea (adult) (pediatric): Secondary | ICD-10-CM | POA: Diagnosis not present

## 2018-01-12 ENCOUNTER — Encounter: Payer: Self-pay | Admitting: Family Medicine

## 2018-01-14 ENCOUNTER — Ambulatory Visit (INDEPENDENT_AMBULATORY_CARE_PROVIDER_SITE_OTHER): Payer: Medicare Other | Admitting: Family Medicine

## 2018-01-14 ENCOUNTER — Encounter: Payer: Self-pay | Admitting: Family Medicine

## 2018-01-14 VITALS — BP 120/60 | HR 77 | Temp 97.7°F | Ht 64.5 in | Wt 135.1 lb

## 2018-01-14 DIAGNOSIS — J0100 Acute maxillary sinusitis, unspecified: Secondary | ICD-10-CM

## 2018-01-14 DIAGNOSIS — G2581 Restless legs syndrome: Secondary | ICD-10-CM | POA: Diagnosis not present

## 2018-01-14 MED ORDER — AMOXICILLIN-POT CLAVULANATE 875-125 MG PO TABS: 1.0000 | ORAL_TABLET | Freq: Two times a day (BID) | ORAL | 0 refills | Status: DC

## 2018-01-14 NOTE — Progress Notes (Signed)
Subjective:     Patient ID: Briana Fitzgerald, female   DOB: 09-23-1931, 84 y.o.   MRN: [id number]  HPI Patient seen with almost 2 week history of sinus congestion and cough. She's also had some frequent sore throat symptoms. She's had some night sweats but no definite fevers. Increased malaise. She's had some yellowish nasal discharge for 2 weeks left nares greater than right. Also reports some cough productive of green sputum. No dyspnea. No nausea or vomiting. Drinking fluids well.  Restless leg symptoms.  Takes Mirapex at night. She has recently started having some increasing problems late afternoon and early evening. She takes 0.5 mg Mirapex at night and this usually helps control her nighttime symptoms. Drinks about 2 cups of coffee per day.  Past Medical History:  Diagnosis Date  . Arthritis   . BACK PAIN 07/18/2008  . Bowel obstruction ([place])   . CONSTIPATION 10/02/2010  . GERD 10/02/2010  . Headache   . IBS (irritable bowel syndrome)   . LAMINECTOMY, LUMBAR, HX OF 06/16/2008  . OSTEOARTHRITIS 10/02/2010  . Pneumonia   . RESTLESS LEG SYNDROME 10/02/2010  . SACROILIAC JOINT DYSFUNCTION 06/16/2008  . Sleep apnea   . STYE 10/02/2010  . TRANSIENT ISCHEMIC ATTACKS, HX OF 10/02/2010   Past Surgical History:  Procedure Laterality Date  . ABDOMINAL HYSTERECTOMY  1979   TAH, fibroids  . [place] / [place]  2005  . APPENDECTOMY  1941  . EYE SURGERY  2005   catarac,both eyes  . LIVER BIOPSY  1997   benign, hemangioma resection  . SPINE SURGERY  2006   laminectomy    reports that she has never smoked. She has never used smokeless tobacco. She reports that she drinks alcohol. She reports that she does not use drugs. family history includes Breast cancer in her paternal aunt; Diabetes in her father; Pancreatitis in her brother; Stroke in her father. No Known Allergies   Review of Systems  Constitutional: Positive for fatigue. Negative for chills and fever.  HENT: Positive for  congestion, sinus pressure, sinus pain and sore throat.   Respiratory: Positive for cough. Negative for shortness of breath and wheezing.   Cardiovascular: Negative for chest pain, palpitations and leg swelling.  Neurological: Negative for headaches.       Objective:   Physical Exam  Constitutional: She appears well-developed and well-nourished.  Non-toxic appearance. She does not appear ill. No distress.  HENT:  Right Ear: Tympanic membrane and ear canal normal.  Left Ear: Tympanic membrane and ear canal normal.  Mouth/Throat: Oropharynx is clear and moist and mucous membranes are normal. No oropharyngeal exudate.  Neck: Neck supple.  Cardiovascular: Normal rate and regular rhythm.  Pulmonary/Chest: Effort normal and breath sounds normal.  Lymphadenopathy:    She has no cervical adenopathy.  Vitals reviewed.      Assessment:     #1 probable acute maxillary sinusitis. Nonfocal lung exam inpatient and no respiratory distress  #2 restless leg syndrome. Possibly exacerbated by caffeine. Patient does have history of chronic mild anemia and need to consider ferritin on follow-up labs    Plan:     -Augmentin 875 mg twice daily for 10 days and take with food -Recommend she reduce her afternoon cup of coffee explained the caffeine could be exacerbating her restless legs -Ferritin level at follow-up with labs -Follow-up promptly for any fever, shortness of breath, or other concerns -We explored other options for restless legs such as twice a day dosing of Requip but at  this point she does not wish to make medication changes  Eulas Post MD [facility] at [facility]

## 2018-01-14 NOTE — Patient Instructions (Signed)

## 2018-02-05 ENCOUNTER — Other Ambulatory Visit: Payer: Self-pay | Admitting: Family Medicine

## 2018-03-02 ENCOUNTER — Encounter: Payer: Self-pay | Admitting: Family Medicine

## 2018-03-02 ENCOUNTER — Ambulatory Visit (INDEPENDENT_AMBULATORY_CARE_PROVIDER_SITE_OTHER): Payer: Medicare Other | Admitting: Family Medicine

## 2018-03-02 VITALS — BP 118/52 | HR 80 | Temp 97.6°F | Wt 131.5 lb

## 2018-03-02 DIAGNOSIS — R5383 Other fatigue: Secondary | ICD-10-CM | POA: Diagnosis not present

## 2018-03-02 DIAGNOSIS — R42 Dizziness and giddiness: Secondary | ICD-10-CM

## 2018-03-02 DIAGNOSIS — M25552 Pain in left hip: Secondary | ICD-10-CM

## 2018-03-02 NOTE — Patient Instructions (Signed)
We will call you about coming back in for the hip X-ray.

## 2018-03-02 NOTE — Progress Notes (Signed)
Subjective:     Patient ID: Briana Fitzgerald, female   DOB: 1932-05-30, 84 y.o.   MRN: [id number]  HPI Patient seen for the following issues  Left hip pain for 2-3 months. No known injury. Location is somewhat more lateral and even in the buttock region. Worse with movement. She has some pain with ambulation. No alleviating factors. She has some intermittent low back pain. Symptoms have been progressive. Ambulating much less than usual.  Patient states she's had at least 3 months of some dizziness. Symptoms are intermittent. She describes what sounds like vertigo. No nausea. She has chronic hearing loss. No headaches. Symptoms do last several minutes and then improve. No syncope. Patient had MRI scan of the brain last year in June which showed remote left parietal and left cerebellar infarcts. No recent speech change. No focal weakness.  Husband has concerns about her general decline in terms of fatigue and generalized weakness over recent months. He thinks she's had some difficulty with word finding but no other confusion.  Past Medical History:  Diagnosis Date  . Arthritis   . BACK PAIN 07/18/2008  . Bowel obstruction ([place])   . CONSTIPATION 10/02/2010  . GERD 10/02/2010  . Headache   . IBS (irritable bowel syndrome)   . LAMINECTOMY, LUMBAR, HX OF 06/16/2008  . OSTEOARTHRITIS 10/02/2010  . Pneumonia   . RESTLESS LEG SYNDROME 10/02/2010  . SACROILIAC JOINT DYSFUNCTION 06/16/2008  . Sleep apnea   . STYE 10/02/2010  . TRANSIENT ISCHEMIC ATTACKS, HX OF 10/02/2010   Past Surgical History:  Procedure Laterality Date  . ABDOMINAL HYSTERECTOMY  1979   TAH, fibroids  . [place]OG[place] / [place]  2005  . APPENDECTOMY  1941  . EYE SURGERY  2005   catarac,both eyes  . LIVER BIOPSY  1997   benign, hemangioma resection  . SPINE SURGERY  2006   laminectomy    reports that she has never smoked. She has never used smokeless tobacco. She reports that she drinks alcohol. She reports that she does not  use drugs. family history includes Breast cancer in her paternal aunt; Diabetes in her father; Pancreatitis in her brother; Stroke in her father. No Known Allergies   Review of Systems  Constitutional: Positive for fatigue. Negative for chills and fever.  Eyes: Negative for visual disturbance.  Respiratory: Negative for cough, chest tightness, shortness of breath and wheezing.   Cardiovascular: Negative for chest pain, palpitations and leg swelling.  Gastrointestinal: Negative for abdominal pain.  Genitourinary: Negative for dysuria.  Musculoskeletal: Negative for back pain.  Neurological: Positive for dizziness and weakness (Generalized). Negative for seizures, syncope, speech difficulty, light-headedness and headaches.  Psychiatric/Behavioral: Negative for confusion.       Objective:   Physical Exam  Constitutional: She is oriented to person, place, and time.  Thin alert cooperative elderly female no distress  Cardiovascular: Normal rate and regular rhythm.  Pulmonary/Chest: Effort normal and breath sounds normal. She has no wheezes. She has no rales.  Musculoskeletal: She exhibits no edema.  She has fairly good range of motion left hip. Mild tenderness laterally over the bursa region. Only minimal pain with internal rotation  Neurological: She is alert and oriented to person, place, and time. No cranial nerve deficit. Coordination normal.  Cannot reproduce produce any dizziness on exam with Dix-Hallpike maneuver       Assessment:     #1 left hip pain. May represent more bursitis. Her pain is somewhat poorly localized.  #2 dizziness intermittently for  several months. Sounds more like vestibular vertigo related dizziness. Nonfocal exam this time  #3 general malaise and fatigue of uncertain etiology    Plan:     -Patient return for x-rays left hip when our x-ray is up and going -Obtain further labs with TSH, CBC, comprehensive metabolic panel, H41 level -Set up follow-up in  2 weeks to reassess after getting labs and x-ray. If x-rays otherwise unrevealing consider injection into left bursa which may be causing a good amount her pain -Consider trial of physical therapy  Eulas Post MD [facility] at [facility]

## 2018-03-03 ENCOUNTER — Other Ambulatory Visit: Payer: Medicare Other

## 2018-03-03 LAB — CBC WITH DIFFERENTIAL/PLATELET
Basophils Absolute: 0.1 10*3/uL (ref 0.0–0.1)
Basophils Relative: 1.4 % (ref 0.0–3.0)
Eosinophils Absolute: 0.1 10*3/uL (ref 0.0–0.7)
Eosinophils Relative: 0.9 % (ref 0.0–5.0)
HCT: 35.5 % — ABNORMAL LOW (ref 36.0–46.0)
Hemoglobin: 12 g/dL (ref 12.0–15.0)
Lymphocytes Relative: 69.7 % — ABNORMAL HIGH (ref 12.0–46.0)
Lymphs Abs: 7 10*3/uL — ABNORMAL HIGH (ref 0.7–4.0)
MCHC: 33.9 g/dL (ref 30.0–36.0)
MCV: 78.6 fl (ref 78.0–100.0)
Monocytes Absolute: 0.3 10*3/uL (ref 0.1–1.0)
Monocytes Relative: 3 % (ref 3.0–12.0)
Neutro Abs: 2.5 10*3/uL (ref 1.4–7.7)
Neutrophils Relative %: 25 % — ABNORMAL LOW (ref 43.0–77.0)
Platelets: 105 10*3/uL — ABNORMAL LOW (ref 150.0–400.0)
RBC: 4.52 Mil/uL (ref 3.87–5.11)
RDW: 17.4 % — ABNORMAL HIGH (ref 11.5–15.5)
WBC: 10 10*3/uL (ref 4.0–10.5)

## 2018-03-03 LAB — COMPREHENSIVE METABOLIC PANEL
ALT: 17 U/L (ref 0–35)
AST: 29 U/L (ref 0–37)
Albumin: 3.8 g/dL (ref 3.5–5.2)
Alkaline Phosphatase: 65 U/L (ref 39–117)
BUN: 12 mg/dL (ref 6–23)
CO2: 27 mEq/L (ref 19–32)
Calcium: 9.7 mg/dL (ref 8.4–10.5)
Chloride: 103 mEq/L (ref 96–112)
Creatinine, Ser: 0.76 mg/dL (ref 0.40–1.20)
GFR: 77.04 mL/min (ref >60.00)
Glucose, Bld: 105 mg/dL — ABNORMAL HIGH (ref 70–99)
Potassium: 4.1 mEq/L (ref 3.5–5.1)
Sodium: 136 mEq/L (ref 135–145)
Total Bilirubin: 1.4 mg/dL — ABNORMAL HIGH (ref 0.2–1.2)
Total Protein: 6.6 g/dL (ref 6.0–8.3)

## 2018-03-03 LAB — TSH: TSH: 3.22 u[IU]/mL (ref 0.35–4.50)

## 2018-03-03 LAB — VITAMIN B12: Vitamin B-12: 718 pg/mL (ref 211–911)

## 2018-03-04 ENCOUNTER — Telehealth: Payer: Self-pay | Admitting: Family Medicine

## 2018-03-04 NOTE — Telephone Encounter (Signed)
Pt mychart isn't up she doesn't understand it and she would like a copy of labs and for someone to explain the labs to her

## 2018-03-04 NOTE — Telephone Encounter (Signed)
Copied from [place] [id number]. Topic: General - Other >> Mar 04, 2018  2:54 PM Yvette Rack wrote: Reason for CRM: pt calling about lab results

## 2018-03-05 NOTE — Telephone Encounter (Signed)
Patient walked into office w/ spouse to review labs.  All recent results reviewed in detail and copy of labs given to patient.

## 2018-03-11 NOTE — Addendum Note (Signed)
Addended by: Eulas Post on: 03/11/2018 07:56 AM   Modules accepted: Orders

## 2018-03-12 ENCOUNTER — Ambulatory Visit (INDEPENDENT_AMBULATORY_CARE_PROVIDER_SITE_OTHER)
Admission: RE | Admit: 2018-03-12 | Discharge: 2018-03-12 | Disposition: A | Discharge status: Home / Self Care | Payer: Medicare Other | Source: Ambulatory Visit | Attending: Family Medicine | Admitting: Family Medicine

## 2018-03-12 DIAGNOSIS — M25552 Pain in left hip: Secondary | ICD-10-CM | POA: Diagnosis not present

## 2018-03-12 DIAGNOSIS — M160 Bilateral primary osteoarthritis of hip: Secondary | ICD-10-CM | POA: Diagnosis not present

## 2018-03-16 ENCOUNTER — Encounter: Payer: Self-pay | Admitting: Family Medicine

## 2018-03-16 ENCOUNTER — Ambulatory Visit (INDEPENDENT_AMBULATORY_CARE_PROVIDER_SITE_OTHER): Payer: Medicare Other | Admitting: Family Medicine

## 2018-03-16 VITALS — BP 110/58 | HR 77 | Temp 97.6°F | Wt 131.8 lb

## 2018-03-16 DIAGNOSIS — R300 Dysuria: Secondary | ICD-10-CM | POA: Diagnosis not present

## 2018-03-16 DIAGNOSIS — M25552 Pain in left hip: Secondary | ICD-10-CM | POA: Diagnosis not present

## 2018-03-16 DIAGNOSIS — R531 Weakness: Secondary | ICD-10-CM

## 2018-03-16 LAB — POCT URINALYSIS DIPSTICK
Bilirubin, UA: NEGATIVE
Blood, UA: NEGATIVE
Glucose, UA: NEGATIVE
Ketones, UA: NEGATIVE
Leukocytes, UA: NEGATIVE
Nitrite, UA: NEGATIVE
Protein, UA: NEGATIVE
Spec Grav, UA: 1.01 (ref 1.010–1.025)
Urobilinogen, UA: 0.2 E.U./dL
pH, UA: 6 (ref 5.0–8.0)

## 2018-03-16 NOTE — Patient Instructions (Signed)
We will set up Sports Medicine referral to Dr Tamala Julian.

## 2018-03-16 NOTE — Progress Notes (Signed)
Subjective:     Patient ID: Briana Fitzgerald, female   DOB: September 20, 1931, 84 y.o.   MRN: [id number]  HPI Follow-up from last visit. She presented with 2-3 month history of some left hip pain without corresponding injury. Pain is somewhat lateral and buttock region and worse with movement. Limiting activities. Obtain x-rays which showed some degenerative changes left hip and lumbar spine but no acute findings.  She's had some general decline terms of fatigue and generalized weakness over several months. We obtained several labs with no acute abnormalities noted.  She has a new complaint today that her urine is darker than usual in color and has somewhat of an odor. No burning with urination. No urine frequency.  Past Medical History:  Diagnosis Date  . Arthritis   . BACK PAIN 07/18/2008  . Bowel obstruction ([place])   . CONSTIPATION 10/02/2010  . GERD 10/02/2010  . Headache   . IBS (irritable bowel syndrome)   . LAMINECTOMY, LUMBAR, HX OF 06/16/2008  . OSTEOARTHRITIS 10/02/2010  . Pneumonia   . RESTLESS LEG SYNDROME 10/02/2010  . SACROILIAC JOINT DYSFUNCTION 06/16/2008  . Sleep apnea   . STYE 10/02/2010  . TRANSIENT ISCHEMIC ATTACKS, HX OF 10/02/2010   Past Surgical History:  Procedure Laterality Date  . ABDOMINAL HYSTERECTOMY  1979   TAH, fibroids  . [place]OG[place] / [place]  2005  . APPENDECTOMY  1941  . EYE SURGERY  2005   catarac,both eyes  . LIVER BIOPSY  1997   benign, hemangioma resection  . SPINE SURGERY  2006   laminectomy    reports that she has never smoked. She has never used smokeless tobacco. She reports that she drinks alcohol. She reports that she does not use drugs. family history includes Breast cancer in her paternal aunt; Diabetes in her father; Pancreatitis in her brother; Stroke in her father. No Known Allergies   Review of Systems  Constitutional: Negative for chills and fever.  HENT: Negative for trouble swallowing.   Respiratory: Negative for shortness of  breath.   Cardiovascular: Negative for chest pain.  Gastrointestinal: Negative for abdominal pain.  Genitourinary: Positive for dysuria. Negative for difficulty urinating, flank pain and hematuria.  Musculoskeletal: Positive for back pain.  Neurological: Positive for weakness (Generalized).       Objective:   Physical Exam  Constitutional: She is oriented to person, place, and time. She appears well-developed and well-nourished.  Cardiovascular: Normal rate.  Pulmonary/Chest: Effort normal and breath sounds normal.  Musculoskeletal: She exhibits no edema.  Left hip reveals only some mild tenderness over the greater trochanteric bursa. She has fairly well preserved range of motion left hip internal and external rotation  Neurological: She is alert and oriented to person, place, and time. No cranial nerve deficit.       Assessment:     #1 musculoskeletal complaints with lower back pain and left hip pain. X-ray does reveals some degenerative changes of the hip but suspect good part of this may be coming from her back.  #2 generalized weakness. Recent labs including CBC, comprehensive metabolic panel, TSH, O84 unremarkable Suspect at least partly secondary to deconditioning from being less active over several months  #3 dysuria. Urine dipstick is completely normal    Plan:     -We discussed options. Discussed possible physical therapy. She would like to instead pursue sports medicine referral first -She's been taking quite a bit of Advil we cautioned her against daily use of Advil her age -Reassurance regarding urinalysis  no evidence for infection. Stay well-hydrated  Eulas Post MD [facility] at [facility]

## 2018-03-20 ENCOUNTER — Telehealth: Payer: Self-pay | Admitting: Family Medicine

## 2018-03-20 NOTE — Telephone Encounter (Signed)
I have scheduled patient for next available.  This is a referral from [employer] to [place].  Patient would like to get in sooner with Briana Fitzgerald at all possible.  Please follow up with patient in regard.   Thanks!

## 2018-03-25 NOTE — Telephone Encounter (Signed)
Patient checking status.

## 2018-03-30 NOTE — Telephone Encounter (Signed)
Spoke to pt, scheduled her 7.18.19 @ 230pm.

## 2018-04-01 NOTE — Progress Notes (Unsigned)
[facility] [address], [place] [address] Phone: [phone] Subjective:    I'm seeing this patient by the request  of:    CC:   [id number]  Briana Fitzgerald is a 84 y.o. female coming in with complaint of ***  Onset-  Location Duration-  Character- Aggravating factors- Reliving factors-  Therapies tried-  Severity-     Past Medical History:  Diagnosis Date  . Arthritis   . BACK PAIN 07/18/2008  . Bowel obstruction ([place])   . CONSTIPATION 10/02/2010  . GERD 10/02/2010  . Headache   . IBS (irritable bowel syndrome)   . LAMINECTOMY, LUMBAR, HX OF 06/16/2008  . OSTEOARTHRITIS 10/02/2010  . Pneumonia   . RESTLESS LEG SYNDROME 10/02/2010  . SACROILIAC JOINT DYSFUNCTION 06/16/2008  . Sleep apnea   . STYE 10/02/2010  . TRANSIENT ISCHEMIC ATTACKS, HX OF 10/02/2010   Past Surgical History:  Procedure Laterality Date  . ABDOMINAL HYSTERECTOMY  1979   TAH, fibroids  . [place]OG[place] / [place]  2005  . APPENDECTOMY  1941  . EYE SURGERY  2005   catarac,both eyes  . LIVER BIOPSY  1997   benign, hemangioma resection  . SPINE SURGERY  2006   laminectomy   Social History   Socioeconomic History  . Marital status: Married    Spouse name: Not on file  . Number of children: 2  . Years of education: Not on file  . Highest education level: Not on file  Occupational History  . Occupation: retired  Scientific laboratory technician  . Financial resource strain: Not on file  . Food insecurity:    Worry: Not on file    Inability: Not on file  . Transportation needs:    Medical: Not on file    Non-medical: Not on file  Tobacco Use  . Smoking status: Never Smoker  . Smokeless tobacco: Never Used  Substance and Sexual Activity  . Alcohol use: Yes    Comment: at dinner with friends, 2-3 per month - wine  . Drug use: No  . Sexual activity: Not on file  Lifestyle  . Physical activity:    Days per week: Not on file    Minutes per session: Not on file    . Stress: Not on file  Relationships  . Social connections:    Talks on phone: Not on file    Gets together: Not on file    Attends religious service: Not on file    Active member of club or organization: Not on file    Attends meetings of clubs or organizations: Not on file    Relationship status: Not on file  Other Topics Concern  . Not on file  Social History Narrative  . Not on file   No Known Allergies Family History  Problem Relation Age of Onset  . Stroke Father   . Diabetes Father        type ll  . Pancreatitis Brother   . Breast cancer Paternal Aunt        aunt  . Colon cancer Neg Hx   . Stomach cancer Neg Hx   . Rectal cancer Neg Hx   . Liver cancer Neg Hx   . Esophageal cancer Neg Hx      Past medical history, social, surgical and family history all reviewed in electronic medical record.  No pertanent information unless stated regarding to the chief complaint.   Review of Systems:Review of systems updated  and as accurate as of 04/01/18  No headache, visual changes, nausea, vomiting, diarrhea, constipation, dizziness, abdominal pain, skin rash, fevers, chills, night sweats, weight loss, swollen lymph nodes, body aches, joint swelling, muscle aches, chest pain, shortness of breath, mood changes.   Objective  There were no vitals taken for this visit. Systems examined below as of 04/01/18   General: No apparent distress alert and oriented x3 mood and affect normal, dressed appropriately.  HEENT: Pupils equal, extraocular movements intact  Respiratory: Patient's speak in full sentences and does not appear short of breath  Cardiovascular: No lower extremity edema, non tender, no erythema  Skin: Warm dry intact with no signs of infection or rash on extremities or on axial skeleton.  Abdomen: Soft nontender  Neuro: Cranial nerves II through XII are intact, neurovascularly intact in all extremities with 2+ DTRs and 2+ pulses.  Lymph: No lymphadenopathy of posterior  or anterior cervical chain or axillae bilaterally.  Gait normal with good balance and coordination.  MSK:  Non tender with full range of motion and good stability and symmetric strength and tone of shoulders, elbows, wrist, hip, knee and ankles bilaterally.     Impression and Recommendations:     This case required medical decision making of moderate complexity.      Note: This dictation was prepared with Dragon dictation along with smaller phrase technology. Any transcriptional errors that result from this process are unintentional.

## 2018-04-02 ENCOUNTER — Ambulatory Visit: Payer: Medicare Other | Admitting: Family Medicine

## 2018-04-07 NOTE — Progress Notes (Signed)
[facility] [address] [place], [place] [address] Phone: [phone] Subjective:    I'm seeing this patient by the request  of:  Eulas Post, MD   CC: Bilateral hip pain  [id number]  Briana Fitzgerald is a 84 y.o. female coming in with complaint of bilateral hip pain. Left hip worse. Has xrays. Has never had injections. Pain radiates to her feet. No numbness and tingling to the toes noted.  Past medical history significant for chronic leukemia, anemia status post history of a laminectomy  Onset- Chronic Location- Lateral  Duration- All day pain Character- Sharp, achy Aggravating factors- Walking Reliving factors-  Therapies tried- Tylenol  Severity-6 out of 10    Patient did have x-rays taken March 05, 2018.  Independently visualized by me showing the patient's bilateral hips to have moderate osteoarthritic changes and diffuse degenerative changes of the lumbar spine with mild degenerative scoliosis.  Past Medical History:  Diagnosis Date  . Arthritis   . BACK PAIN 07/18/2008  . Bowel obstruction ([place])   . CONSTIPATION 10/02/2010  . GERD 10/02/2010  . Headache   . IBS (irritable bowel syndrome)   . LAMINECTOMY, LUMBAR, HX OF 06/16/2008  . OSTEOARTHRITIS 10/02/2010  . Pneumonia   . RESTLESS LEG SYNDROME 10/02/2010  . SACROILIAC JOINT DYSFUNCTION 06/16/2008  . Sleep apnea   . STYE 10/02/2010  . TRANSIENT ISCHEMIC ATTACKS, HX OF 10/02/2010   Past Surgical History:  Procedure Laterality Date  . ABDOMINAL HYSTERECTOMY  1979   TAH, fibroids  . [place]OG[place] / [place]  2005  . APPENDECTOMY  1941  . EYE SURGERY  2005   catarac,both eyes  . LIVER BIOPSY  1997   benign, hemangioma resection  . SPINE SURGERY  2006   laminectomy   Social History   Socioeconomic History  . Marital status: Married    Spouse name: Not on file  . Number of children: 2  . Years of education: Not on file  . Highest education level: Not on file    Occupational History  . Occupation: retired  Scientific laboratory technician  . Financial resource strain: Not on file  . Food insecurity:    Worry: Not on file    Inability: Not on file  . Transportation needs:    Medical: Not on file    Non-medical: Not on file  Tobacco Use  . Smoking status: Never Smoker  . Smokeless tobacco: Never Used  Substance and Sexual Activity  . Alcohol use: Yes    Comment: at dinner with friends, 2-3 per month - wine  . Drug use: No  . Sexual activity: Not on file  Lifestyle  . Physical activity:    Days per week: Not on file    Minutes per session: Not on file  . Stress: Not on file  Relationships  . Social connections:    Talks on phone: Not on file    Gets together: Not on file    Attends religious service: Not on file    Active member of club or organization: Not on file    Attends meetings of clubs or organizations: Not on file    Relationship status: Not on file  Other Topics Concern  . Not on file  Social History Narrative  . Not on file   No Known Allergies Family History  Problem Relation Age of Onset  . Stroke Father   . Diabetes Father        type ll  .  Pancreatitis Brother   . Breast cancer Paternal Aunt        aunt  . Colon cancer Neg Hx   . Stomach cancer Neg Hx   . Rectal cancer Neg Hx   . Liver cancer Neg Hx   . Esophageal cancer Neg Hx      Past medical history, social, surgical and family history all reviewed in electronic medical record.  No pertanent information unless stated regarding to the chief complaint.   Review of Systems:Review of systems updated and as accurate as of 04/08/18  No headache, visual changes, nausea, vomiting, diarrhea, constipation, dizziness, abdominal pain, skin rash, fevers, chills, night sweats, weight loss, swollen lymph nodes, body aches, joint swelling, , chest pain, shortness of breath, mood changes.  Positive muscle aches  Objective  Blood pressure 124/64, pulse 79, height 5' 4.5" (1.638 m),  weight 140 lb (63.5 kg), SpO2 97 %. Systems examined below as of 04/08/18   General: No apparent distress alert and oriented x3 mood and affect normal, dressed appropriately.  HEENT: Pupils equal, extraocular movements intact  Respiratory: Patient's speak in full sentences and does not appear short of breath  Cardiovascular: Trace lower extremity edema, non tender, no erythema  Skin: Warm dry intact with no signs of infection or rash on extremities or on axial skeleton.  Abdomen: Soft nontender  Neuro: Cranial nerves II through XII are intact, neurovascularly intact in all extremities with 2+ DTRs and 2+ pulses.  Lymph: No lymphadenopathy of posterior or anterior cervical chain or axillae bilaterally.  Gait antalgic Trendelenburg on the right side MSK:  Non tender with full range of motion and good stability and symmetric strength and tone of shoulders, elbows, wrist, knee and ankles bilaterally.  Back exam shows degenerative scoliosis with some loss of lordosis.  Patient's hips have decreased range of motion with only 10 degrees of internal rotation.  Mild discomfort over the greater trochanteric bilaterally.  Pain over the right sacroiliac joint   [address]7110; 15 additional minutes spent for Therapeutic exercises as stated in above notes.  This included exercises focusing on stretching, strengthening, with significant focus on eccentric aspects.   Long term goals include an improvement in range of motion, strength, endurance as well as avoiding reinjury. Patient's frequency would include in 1-2 times a day, 3-5 times a week for a duration of 6-12 weeks. Low back exercises that included:  Pelvic tilt/bracing instruction to focus on control of the pelvic girdle and lower abdominal muscles  Proper stretching techniques for maximum relief for the hamstrings, hip flexors, low back and some rotation where tolerated   Proper technique shown and discussed handout in great detail with ATC.  All questions were  discussed and answered.     Impression and Recommendations:     This case required medical decision making of moderate complexity.      Note: This dictation was prepared with Dragon dictation along with smaller phrase technology. Any transcriptional errors that result from this process are unintentional.

## 2018-04-08 ENCOUNTER — Ambulatory Visit: Payer: Medicare Other | Admitting: Family Medicine

## 2018-04-08 ENCOUNTER — Ambulatory Visit: Payer: Self-pay

## 2018-04-08 ENCOUNTER — Encounter: Payer: Self-pay | Admitting: Family Medicine

## 2018-04-08 VITALS — BP 124/64 | HR 79 | Ht 64.5 in | Wt 140.0 lb

## 2018-04-08 DIAGNOSIS — M25552 Pain in left hip: Principal | ICD-10-CM

## 2018-04-08 DIAGNOSIS — M9904 Segmental and somatic dysfunction of sacral region: Secondary | ICD-10-CM | POA: Diagnosis not present

## 2018-04-08 DIAGNOSIS — M25551 Pain in right hip: Secondary | ICD-10-CM

## 2018-04-08 DIAGNOSIS — M810 Age-related osteoporosis without current pathological fracture: Secondary | ICD-10-CM

## 2018-04-08 DIAGNOSIS — M9903 Segmental and somatic dysfunction of lumbar region: Secondary | ICD-10-CM | POA: Diagnosis not present

## 2018-04-08 DIAGNOSIS — M999 Biomechanical lesion, unspecified: Secondary | ICD-10-CM

## 2018-04-08 DIAGNOSIS — M533 Sacrococcygeal disorders, not elsewhere classified: Secondary | ICD-10-CM

## 2018-04-08 NOTE — Assessment & Plan Note (Signed)
Decision today to treat with OMT was based on Physical Exam  After verbal consent patient was treated with  FPR techniques in lumbar and sacral areas  Patient tolerated the procedure well with improvement in symptoms  Patient given exercises, stretches and lifestyle modifications  See medications in patient instructions if given  Patient will follow up in 4 weeks

## 2018-04-08 NOTE — Patient Instructions (Signed)
Very nice to meet you  We tried to get a little improvement with some massage to the lower back  Ice 20 minutes 2 times daily. Usually after activity and before bed. Exercises 3 times a week.  A lot of this is the arthritis and the leukemia and we will need to be careful and patient  pennsaid pinkie amount topically 2 times daily as needed.  Over the counter Tart cherry extract any dose at night Also iron 65 mg with 500mg  of vitamin C 3 times a week.  If too much constipation we will need to consider infusion  See me again in 4 weeks

## 2018-04-08 NOTE — Assessment & Plan Note (Signed)
Patient has had discomfort over the sacrum again.  Seems to be on the right side.  Attempted a very indirect technique with myofascial release around the area with patient having some relief.  Topical anti-inflammatories and icing regimen.  Discussed that I do not want to do any aggressive therapies for patient's back.  Do not think she will respond well to formal physical therapy either.  Patient and do believe looking at patient's laboratory values is having worsening of her leukemia.  Also patient is having increasing anemia which is concerning.  May need laboratory work-up patient will follow-up again in 3 to 4 weeks

## 2018-04-08 NOTE — Assessment & Plan Note (Signed)
Encourage vitamin D supplementation

## 2018-04-15 ENCOUNTER — Ambulatory Visit: Payer: Medicare Other | Admitting: Family Medicine

## 2018-04-15 ENCOUNTER — Encounter

## 2018-04-16 ENCOUNTER — Other Ambulatory Visit: Payer: Self-pay | Admitting: Family Medicine

## 2018-04-16 NOTE — Telephone Encounter (Signed)
LOV  01/14/18  Dr. Elease Hashimoto Last refilled 02/06/18   # 64 with 0 refill

## 2018-04-16 NOTE — Telephone Encounter (Signed)
Copied from [id number]. Topic: Quick Communication - Rx Refill/Question >> Apr 16, 2018  2:23 PM Yvette Rack wrote: Medication: pramipexole (MIRAPEX) 0.5 MG tablet he has to take this at bedtime and a half in early afternoon so he is running out of pills  Has the patient contacted their pharmacy? Yes.  I transfer pt to pharmacy (Agent: If no, request that the patient contact the pharmacy for the refill.) (Agent: If yes, when and what did the pharmacy advise?)  Preferred Pharmacy (with phone number or street name):   [id number] - [place], [place] - [address] [phone] (Phone) [phone] (Fax)      Agent: Please be advised that RX refills may take up to 3 business days. We ask that you follow-up with your pharmacy.

## 2018-04-17 NOTE — Telephone Encounter (Signed)
Refill for one year 

## 2018-04-20 MED ORDER — PRAMIPEXOLE DIHYDROCHLORIDE 0.5 MG PO TABS: 0.5000 mg | ORAL_TABLET | Freq: Every day | ORAL | 3 refills | Status: DC

## 2018-04-20 NOTE — Telephone Encounter (Signed)
Rx done. 

## 2018-05-04 ENCOUNTER — Ambulatory Visit (INDEPENDENT_AMBULATORY_CARE_PROVIDER_SITE_OTHER): Payer: Medicare Other | Admitting: Family Medicine

## 2018-05-04 ENCOUNTER — Ambulatory Visit (INDEPENDENT_AMBULATORY_CARE_PROVIDER_SITE_OTHER): Payer: Medicare Other

## 2018-05-04 VITALS — BP 120/60 | HR 75 | Temp 98.5°F | Wt 136.3 lb

## 2018-05-04 DIAGNOSIS — K5900 Constipation, unspecified: Secondary | ICD-10-CM

## 2018-05-04 DIAGNOSIS — R1032 Left lower quadrant pain: Secondary | ICD-10-CM

## 2018-05-04 NOTE — Progress Notes (Signed)
[facility] [place],  [address] Phone: [phone] Subjective:    I'm seeing this patient by the request  of:    CC: Back pain follow-up  JHE:[id number]  Briana Fitzgerald is a 84 y.o. female coming in with complaint of back pain.  Past medical history is significant for chronic leukemia and appears to have failure to thrive.  Known mild scoliosis.  Seems to have a more of a right-sided sacroiliac joint dysfunction versus arthritis.  Patient was encouraged to try conservative therapy including home exercises, icing regimen, topical anti-inflammatories as well as natural supplementations.  Patient states her hip is a little better. Still using Pennsaid.        Past Medical History:  Diagnosis Date  . Arthritis   . BACK PAIN 07/18/2008  . Bowel obstruction ([place])   . CONSTIPATION 10/02/2010  . GERD 10/02/2010  . Headache   . IBS (irritable bowel syndrome)   . LAMINECTOMY, LUMBAR, HX OF 06/16/2008  . OSTEOARTHRITIS 10/02/2010  . Pneumonia   . RESTLESS LEG SYNDROME 10/02/2010  . SACROILIAC JOINT DYSFUNCTION 06/16/2008  . Sleep apnea   . STYE 10/02/2010  . TRANSIENT ISCHEMIC ATTACKS, HX OF 10/02/2010   Past Surgical History:  Procedure Laterality Date  . ABDOMINAL HYSTERECTOMY  1979   TAH, fibroids  . [place] / [place]  2005  . APPENDECTOMY  1941  . EYE SURGERY  2005   catarac,both eyes  . LIVER BIOPSY  1997   benign, hemangioma resection  . SPINE SURGERY  2006   laminectomy   Social History   Socioeconomic History  . Marital status: Married    Spouse name: Not on file  . Number of children: 2  . Years of education: Not on file  . Highest education level: Not on file  Occupational History  . Occupation: retired  Scientific laboratory technician  . Financial resource strain: Not on file  . Food insecurity:    Worry: Not on file    Inability: Not on file  . Transportation needs:    Medical: Not on file    Non-medical: Not on file    Tobacco Use  . Smoking status: Never Smoker  . Smokeless tobacco: Never Used  Substance and Sexual Activity  . Alcohol use: Yes    Comment: at dinner with friends, 2-3 per month - wine  . Drug use: No  . Sexual activity: Not on file  Lifestyle  . Physical activity:    Days per week: Not on file    Minutes per session: Not on file  . Stress: Not on file  Relationships  . Social connections:    Talks on phone: Not on file    Gets together: Not on file    Attends religious service: Not on file    Active member of club or organization: Not on file    Attends meetings of clubs or organizations: Not on file    Relationship status: Not on file  Other Topics Concern  . Not on file  Social History Narrative  . Not on file   No Known Allergies Family History  Problem Relation Age of Onset  . Stroke Father   . Diabetes Father        type ll  . Pancreatitis Brother   . Breast cancer Paternal Aunt        aunt  . Colon cancer Neg Hx   . Stomach cancer Neg Hx   .  Rectal cancer Neg Hx   . Liver cancer Neg Hx   . Esophageal cancer Neg Hx      Past medical history, social, surgical and family history all reviewed in electronic medical record.  No pertanent information unless stated regarding to the chief complaint.   Review of Systems:Review of systems updated and as accurate as of 05/06/18  No headache, visual changes, nausea, vomiting, diarrhea, constipation, dizziness, abdominal pain, skin rash, fevers, chills, night sweats, weight loss, swollen lymph nodes,  chest pain, mood changes.  Positive shortness of breath, leg swelling, body aches, muscle aches  Objective  Blood pressure 130/60, pulse 78, height 5' 4.5" (1.638 m), weight 135 lb (61.2 kg), SpO2 97 %. Systems examined below as of 05/06/18   General: No apparent distress alert and oriented x3 mood and affect normal, dressed appropriately.  HEENT: Pupils equal, extraocular movements intact  Respiratory: Patient having  difficulty even speaking in full sentences.  Patient does have crackles in the bilateral lower lobes Cardiovascular: 3++lower extremity edema, non tender, no erythema severe bilaterally regular rate and rhythm the patient does have a fairly strong 3 out of 6 systolic murmur more on the right sternal border. Skin: Warm dry intact with no signs of infection or rash on extremities or on axial skeleton.  Mild senile purpura noted Abdomen: Soft patient is distended though.  He does feel to have an edema noted Neuro: Cranial nerves II through XII are intact, neurovascularly intact in all extremities with 2+ DTRs and 2+ pulses.  Lymph: No lymphadenopathy of posterior or anterior cervical chain or axillae bilaterally.  Gait antalgic with wide-based gait MSK:  tender with limited range of motion but good stability and symmetric strength and tone of shoulders, elbows, wrist, hip, knee and ankles bilaterally.  Significant arthritic changes of multiple joints     Impression and Recommendations:     This case required medical decision making of moderate complexity.      Note: This dictation was prepared with Dragon dictation along with smaller phrase technology. Any transcriptional errors that result from this process are unintentional.

## 2018-05-04 NOTE — Patient Instructions (Addendum)
Constipation, Adult Constipation is when a person has fewer bowel movements in a week than normal, has difficulty having a bowel movement, or has stools that are dry, hard, or larger than normal. Constipation may be caused by an underlying condition. It may become worse with age if a person takes certain medicines and does not take in enough fluids. Follow these instructions at home: Eating and drinking   Eat foods that have a lot of fiber, such as fresh fruits and vegetables, whole grains, and beans.  Limit foods that are high in fat, low in fiber, or overly processed, such as french fries, hamburgers, cookies, candies, and soda.  Drink enough fluid to keep your urine clear or pale yellow. General instructions  Exercise regularly or as told by your health care provider.  Go to the restroom when you have the urge to go. Do not hold it in.  Take over-the-counter and prescription medicines only as told by your health care provider. These include any fiber supplements.  Practice pelvic floor retraining exercises, such as deep breathing while relaxing the lower abdomen and pelvic floor relaxation during bowel movements.  Watch your condition for any changes.  Keep all follow-up visits as told by your health care provider. This is important. Contact a health care provider if:  You have pain that gets worse.  You have a fever.  You do not have a bowel movement after 4 days.  You vomit.  You are not hungry.  You lose weight.  You are bleeding from the anus.  You have thin, pencil-like stools. Get help right away if:  You have a fever and your symptoms suddenly get worse.  You leak stool or have blood in your stool.  Your abdomen is bloated.  You have severe pain in your abdomen.  You feel dizzy or you faint. This information is not intended to replace advice given to you by your health care provider. Make sure you discuss any questions you have with your health care  provider. Document Released: 05/31/2004 Document Revised: 03/22/2016 Document Reviewed: 02/21/2016 Elsevier Interactive Patient Education  2018 [employer].  Try over the counter fiber supplement such as Metamucil or fibercon.  Consider OTC Senokot S one to two per day  Consider Miralax 17 grams once daily.

## 2018-05-04 NOTE — Progress Notes (Signed)
  Subjective:     Patient ID: Briana Fitzgerald, female   DOB: 1932/05/31, 84 y.o.   MRN: [id number]  HPI Patient seen with some recent back and hip issues. She was doing some exercises that were instructed by sports medicine about a week ago and felt some discomfort in her left lower abdominal quadrant. She's felt somewhat distended in that region and some mild discomfort.   She has recently battle with some constipation issues. She has not had a bowel movement about 3 days. She's had to strain some recently. No bloody stools. She is trying to eat lots of high-fiber foods and is drinking lots of water. She has taken prune juice in the past with some relief. Not on any stool softeners. Used MiraLAX couple times without much improvement in the past.  Denies any fevers or chills. No dysuria. No alleviating or exacerbating factors. Discomfort is relatively mild.  Past Medical History:  Diagnosis Date  . Arthritis   . BACK PAIN 07/18/2008  . Bowel obstruction ([place])   . CONSTIPATION 10/02/2010  . GERD 10/02/2010  . Headache   . IBS (irritable bowel syndrome)   . LAMINECTOMY, LUMBAR, HX OF 06/16/2008  . OSTEOARTHRITIS 10/02/2010  . Pneumonia   . RESTLESS LEG SYNDROME 10/02/2010  . SACROILIAC JOINT DYSFUNCTION 06/16/2008  . Sleep apnea   . STYE 10/02/2010  . TRANSIENT ISCHEMIC ATTACKS, HX OF 10/02/2010   Past Surgical History:  Procedure Laterality Date  . ABDOMINAL HYSTERECTOMY  1979   TAH, fibroids  . [place] / [place]  2005  . APPENDECTOMY  1941  . EYE SURGERY  2005   catarac,both eyes  . LIVER BIOPSY  1997   benign, hemangioma resection  . SPINE SURGERY  2006   laminectomy    reports that she has never smoked. She has never used smokeless tobacco. She reports that she drinks alcohol. She reports that she does not use drugs. family history includes Breast cancer in her paternal aunt; Diabetes in her father; Pancreatitis in her brother; Stroke in her father. No Known  Allergies   Review of Systems  Constitutional: Positive for fatigue. Negative for chills and fever.  Respiratory: Negative for shortness of breath.   Cardiovascular: Negative for chest pain.  Gastrointestinal: Positive for abdominal pain and constipation. Negative for blood in stool, diarrhea, nausea and vomiting.       Objective:   Physical Exam  Constitutional: She appears well-developed and well-nourished.  Abdominal: Bowel sounds are normal.  Slightly distended especially left lower quadrant. Nontender. She has some firmness to palpation  consistent with likely stool in the left lower quadrant region rectal exam reveals no impaction. She has some small firm stool in the rectal vault but again not impacting. No rectal mass noted.       Assessment:     Abdominal pain left lower quadrant. Suspect related to constipation. Plain film of abdomen reveals increased stool in the right colon as well as sigmoid region. No signs of free air or obstruction.    Plan:     -Measures to reduce constipation including increased fluids, ambulation, adequate fiber -Consider over-the-counter fiber supplements such as Metamucil, FiberCon, or Citrucel -Senokot S 1 to 2 tablets daily for the next week -If no relief with above, try MiraLAX 17 g once daily as needed -Reassess in one week. If abdominal symptoms not improved at that point consider further imaging  Eulas Post MD [facility] at [facility]

## 2018-05-05 ENCOUNTER — Telehealth: Payer: Self-pay | Admitting: Family Medicine

## 2018-05-05 NOTE — Telephone Encounter (Signed)
Copied from [place] [id number]. Topic: Quick Communication - See Telephone Encounter >> May 05, 2018  5:25 PM Neva Seat wrote: Pt asking if her recent over the counter medications could be changed to prescriptions for insurance to pay? Metamucil Senokot

## 2018-05-06 ENCOUNTER — Ambulatory Visit: Payer: Medicare Other | Admitting: Family Medicine

## 2018-05-06 ENCOUNTER — Encounter: Payer: Self-pay | Admitting: Family Medicine

## 2018-05-06 ENCOUNTER — Other Ambulatory Visit (INDEPENDENT_AMBULATORY_CARE_PROVIDER_SITE_OTHER): Payer: Medicare Other

## 2018-05-06 VITALS — BP 130/60 | HR 78 | Ht 64.5 in | Wt 135.0 lb

## 2018-05-06 DIAGNOSIS — M2550 Pain in unspecified joint: Secondary | ICD-10-CM

## 2018-05-06 DIAGNOSIS — I509 Heart failure, unspecified: Secondary | ICD-10-CM

## 2018-05-06 DIAGNOSIS — I5030 Unspecified diastolic (congestive) heart failure: Secondary | ICD-10-CM | POA: Insufficient documentation

## 2018-05-06 DIAGNOSIS — I5032 Chronic diastolic (congestive) heart failure: Secondary | ICD-10-CM | POA: Insufficient documentation

## 2018-05-06 LAB — COMPREHENSIVE METABOLIC PANEL
ALT: 17 U/L (ref 0–35)
AST: 28 U/L (ref 0–37)
Albumin: 3.6 g/dL (ref 3.5–5.2)
Alkaline Phosphatase: 69 U/L (ref 39–117)
BUN: 15 mg/dL (ref 6–23)
CO2: 29 mEq/L (ref 19–32)
Calcium: 10.6 mg/dL — ABNORMAL HIGH (ref 8.4–10.5)
Chloride: 102 mEq/L (ref 96–112)
Creatinine, Ser: 0.76 mg/dL (ref 0.40–1.20)
GFR: 77 mL/min (ref >60.00)
Glucose, Bld: 121 mg/dL — ABNORMAL HIGH (ref 70–99)
Potassium: 4 mEq/L (ref 3.5–5.1)
Sodium: 135 mEq/L (ref 135–145)
Total Bilirubin: 1.4 mg/dL — ABNORMAL HIGH (ref 0.2–1.2)
Total Protein: 7.1 g/dL (ref 6.0–8.3)

## 2018-05-06 LAB — CBC WITH DIFFERENTIAL/PLATELET
Basophils Absolute: 0 10*3/uL (ref 0.0–0.1)
Basophils Relative: 0.6 % (ref 0.0–3.0)
Eosinophils Absolute: 0.1 10*3/uL (ref 0.0–0.7)
Eosinophils Relative: 0.7 % (ref 0.0–5.0)
HCT: 33.3 % — ABNORMAL LOW (ref 36.0–46.0)
Hemoglobin: 11.3 g/dL — ABNORMAL LOW (ref 12.0–15.0)
Lymphocytes Relative: 69.4 % — ABNORMAL HIGH (ref 12.0–46.0)
Lymphs Abs: 5.5 10*3/uL — ABNORMAL HIGH (ref 0.7–4.0)
MCHC: 33.9 g/dL (ref 30.0–36.0)
MCV: 80.3 fl (ref 78.0–100.0)
Monocytes Absolute: 0.2 10*3/uL (ref 0.1–1.0)
Monocytes Relative: 3.1 % (ref 3.0–12.0)
Neutro Abs: 2.1 10*3/uL (ref 1.4–7.7)
Neutrophils Relative %: 26.2 % — ABNORMAL LOW (ref 43.0–77.0)
Platelets: 114 10*3/uL — ABNORMAL LOW (ref 150.0–400.0)
RBC: 4.15 Mil/uL (ref 3.87–5.11)
RDW: 16.5 % — ABNORMAL HIGH (ref 11.5–15.5)
WBC: 8 10*3/uL (ref 4.0–10.5)

## 2018-05-06 LAB — SEDIMENTATION RATE: Sed Rate: 54 mm/hr — ABNORMAL HIGH (ref 0–30)

## 2018-05-06 LAB — BRAIN NATRIURETIC PEPTIDE: Pro B Natriuretic peptide (BNP): 304 pg/mL — ABNORMAL HIGH (ref 0.0–100.0)

## 2018-05-06 LAB — TROPONIN I: TNIDX: 0.01 ug/l (ref 0.00–0.06)

## 2018-05-06 MED ORDER — FUROSEMIDE 20 MG PO TABS: 20.0000 mg | ORAL_TABLET | Freq: Every day | ORAL | 3 refills | Status: DC

## 2018-05-06 NOTE — Telephone Encounter (Signed)
There are no prescription equivalents - to my knowledge.

## 2018-05-06 NOTE — Assessment & Plan Note (Signed)
Significant fluid overloaded noted today.  Patient is short of breath with some crackles noted on exam.  Patient though is fully within regular rhythm but does have a aortic murmur noted.  3+ pitting edema of the lower extremities.  Patient was sent for labs this morning we did discuss the possibility of a direct ED admission which patient declined at the moment.  Patient's did have some constipation previously but that seems to be resolving.  Patient is following up with primary care provider May 06, 2018.  Low dose of Lasix given until patient is seen by primary care and patient as well as her caregiver knows that if any worsening symptoms to seek medical attention immediately.

## 2018-05-06 NOTE — Telephone Encounter (Signed)
Okay to change? 

## 2018-05-06 NOTE — Patient Instructions (Signed)
Good to see you  I really think some of this is your heart at the moment.  Lasix 20 mg daily until you see Dr. Elease Hashimoto [facility] downstairs today- I will likely call you with the results.  PLEASE IF YOU FEEL WORSE PLEASE GO TO THE EMERGENCY ROOM IMMEDIATELY.  Make an appointment with me again in 3 weeks once we get this under control

## 2018-05-11 ENCOUNTER — Encounter: Payer: Self-pay | Admitting: Family Medicine

## 2018-05-11 ENCOUNTER — Ambulatory Visit (INDEPENDENT_AMBULATORY_CARE_PROVIDER_SITE_OTHER): Payer: Medicare Other | Admitting: Family Medicine

## 2018-05-11 VITALS — BP 110/50 | HR 75 | Temp 98.1°F | Wt 131.4 lb

## 2018-05-11 DIAGNOSIS — R600 Localized edema: Secondary | ICD-10-CM

## 2018-05-11 DIAGNOSIS — D72820 Lymphocytosis (symptomatic): Secondary | ICD-10-CM | POA: Insufficient documentation

## 2018-05-11 DIAGNOSIS — R0600 Dyspnea, unspecified: Secondary | ICD-10-CM

## 2018-05-11 NOTE — Telephone Encounter (Signed)
Patient has an appointment 05/11/18.  Will discuss then.

## 2018-05-11 NOTE — Patient Instructions (Signed)
We are setting up echocardiogram to assess heart function  Elevate legs frequently  Keep sodium intake < 2,000 mg daily  Continue Lasix 20 mg daily for now.

## 2018-05-11 NOTE — Progress Notes (Signed)
Subjective:     Patient ID: Briana Fitzgerald, female   DOB: 02/23/1932, 84 y.o.   MRN: [id number]  HPI Patient seen for follow-up regarding possible volume overload. She was seen here/week and apparently according to daughter-in-law developed some increasing edema after she left here fairly acutely over couple of days. She was seen by sports medicine on Friday with increased bilateral leg edema. She was started on Lasix 20 mg daily. BNP levels 301. No known history of heart failure. She's had some chronic malaise and mild dyspnea for several months. No orthopnea.denies any recent chest pains. No history of echocardiogram.  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 She has some chronic mild thrombocytopenia.she had several other labs done on Friday including sedimentation rate of 54,normal troponin, stable hemoglobin 11.3, white count 8.0 with her leukocytosis which has been chronic. Renal function normal.  Since starting Lasix 20 mg daily they think leg edema somewhat improved.  Past Medical History:  Diagnosis Date  . Arthritis   . BACK PAIN 07/18/2008  . Bowel obstruction ([place])   . CONSTIPATION 10/02/2010  . GERD 10/02/2010  . Headache   . IBS (irritable bowel syndrome)   . LAMINECTOMY, LUMBAR, HX OF 06/16/2008  . OSTEOARTHRITIS 10/02/2010  . Pneumonia   . RESTLESS LEG SYNDROME 10/02/2010  . SACROILIAC JOINT DYSFUNCTION 06/16/2008  . Sleep apnea   . STYE 10/02/2010  . TRANSIENT ISCHEMIC ATTACKS, HX OF 10/02/2010   Past Surgical History:  Procedure Laterality Date  . ABDOMINAL HYSTERECTOMY  1979   TAH, fibroids  . [facility] / [place]  2005  . APPENDECTOMY  1941  . EYE SURGERY  2005   catarac,both eyes  . LIVER BIOPSY  1997   benign, hemangioma resection  . SPINE SURGERY  2006   laminectomy    reports that she has never smoked. She has never used smokeless tobacco. She reports  that she drinks alcohol. She reports that she does not use drugs. family history includes Breast cancer in her paternal aunt; Diabetes in her father; Pancreatitis in her brother; Stroke in her father. No Known Allergies'   Review of Systems  Constitutional: Negative for chills and fever.  Respiratory: Positive for shortness of breath. Negative for cough and wheezing.   Cardiovascular: Positive for leg swelling. Negative for chest pain and palpitations.  Gastrointestinal: Negative for abdominal pain, nausea and vomiting.  Neurological: Negative for dizziness and headaches.       Objective:   Physical Exam  Constitutional: She is oriented to person, place, and time. She appears well-developed and well-nourished.  Cardiovascular: Normal rate and regular rhythm.  Pulmonary/Chest: Effort normal and breath sounds normal.  Musculoskeletal:  Mild/trace pitting edema lower legs bilaterally.  Neurological: She is alert and oriented to person, place, and time.       Assessment:     #1 bilateral leg edema. Possible volume overload. No known history of heart failure. Recent BNP modestly elevated 301. Recent thyroid functions normal. Recent albumin normal range. Question diastolic dysfunction  #2 history of chronic lymphocytosis with differential of CLL versus monoclonal beta cell lymphocytosis    Plan:     -check basic metabolic panel with recent initiation of Lasix -Elevate legs frequently -Continue furosemide 20 mg once daily -Set up echocardiogram to further assess and guide therapy -Keep sodium intake less than 2000 mg daily -Office follow-up in 2 weeks to reassess and sooner as  needed for any increased shortness of breath or other concerns -Continue at least every 6 month monitoring of CBC  Eulas Post MD [facility] at [facility]

## 2018-05-12 LAB — BASIC METABOLIC PANEL
BUN: 16 mg/dL (ref 6–23)
CO2: 30 mEq/L (ref 19–32)
Calcium: 10.8 mg/dL — ABNORMAL HIGH (ref 8.4–10.5)
Chloride: 99 mEq/L (ref 96–112)
Creatinine, Ser: 0.88 mg/dL (ref 0.40–1.20)
GFR: 65.02 mL/min (ref >60.00)
Glucose, Bld: 100 mg/dL — ABNORMAL HIGH (ref 70–99)
Potassium: 4.4 mEq/L (ref 3.5–5.1)
Sodium: 132 mEq/L — ABNORMAL LOW (ref 135–145)

## 2018-05-14 ENCOUNTER — Telehealth: Payer: Self-pay | Admitting: Family Medicine

## 2018-05-14 NOTE — Telephone Encounter (Signed)
Okay to refer? 

## 2018-05-14 NOTE — Telephone Encounter (Signed)
Copied from [place] [id number]. Topic: Quick Communication - See Telephone Encounter >> May 14, 2018  8:49 AM Gardiner Ramus wrote: CRM for notification. See Telephone encounter for: 05/14/18. Pt called and stated that she has not received a call from the hear specialist to schedule and appointment. Pt states that she was referred by dr Elease Hashimoto. She would like a call back from the nurse. Please advise

## 2018-05-14 NOTE — Telephone Encounter (Signed)
We have ordered Echo already and this is what she needs at this point.  If they also want to see cardiologist that is fine but our plan was to get Echo first.

## 2018-05-14 NOTE — Telephone Encounter (Signed)
Patient is aware and will wait until after her Echo for her referral.

## 2018-05-19 ENCOUNTER — Other Ambulatory Visit: Payer: Self-pay

## 2018-05-19 ENCOUNTER — Ambulatory Visit (HOSPITAL_COMMUNITY): Payer: Medicare Other | Attending: Cardiovascular Disease

## 2018-05-19 DIAGNOSIS — R600 Localized edema: Secondary | ICD-10-CM | POA: Diagnosis not present

## 2018-05-19 DIAGNOSIS — E785 Hyperlipidemia, unspecified: Secondary | ICD-10-CM | POA: Diagnosis not present

## 2018-05-19 DIAGNOSIS — G4733 Obstructive sleep apnea (adult) (pediatric): Secondary | ICD-10-CM | POA: Insufficient documentation

## 2018-05-19 DIAGNOSIS — R0600 Dyspnea, unspecified: Secondary | ICD-10-CM | POA: Insufficient documentation

## 2018-05-19 DIAGNOSIS — I509 Heart failure, unspecified: Secondary | ICD-10-CM | POA: Insufficient documentation

## 2018-05-27 ENCOUNTER — Encounter: Payer: Self-pay | Admitting: Family Medicine

## 2018-05-27 ENCOUNTER — Ambulatory Visit (INDEPENDENT_AMBULATORY_CARE_PROVIDER_SITE_OTHER): Payer: Medicare Other

## 2018-05-27 ENCOUNTER — Ambulatory Visit (INDEPENDENT_AMBULATORY_CARE_PROVIDER_SITE_OTHER): Payer: Medicare Other | Admitting: Family Medicine

## 2018-05-27 VITALS — BP 110/50 | HR 78 | Temp 97.7°F | Wt 129.3 lb

## 2018-05-27 DIAGNOSIS — R0600 Dyspnea, unspecified: Secondary | ICD-10-CM | POA: Diagnosis not present

## 2018-05-27 DIAGNOSIS — I5032 Chronic diastolic (congestive) heart failure: Secondary | ICD-10-CM

## 2018-05-27 DIAGNOSIS — D649 Anemia, unspecified: Secondary | ICD-10-CM | POA: Diagnosis not present

## 2018-05-27 DIAGNOSIS — R079 Chest pain, unspecified: Secondary | ICD-10-CM | POA: Diagnosis not present

## 2018-05-27 DIAGNOSIS — R0602 Shortness of breath: Secondary | ICD-10-CM | POA: Diagnosis not present

## 2018-05-27 NOTE — Patient Instructions (Signed)
We will be setting up pulmonary (lung) function tests  We are setting up Cardiology referral  We will call you with lab results.

## 2018-05-27 NOTE — Progress Notes (Signed)
Subjective:     Patient ID: Briana Fitzgerald, female   DOB: [birth year], 84 y.o.   MRN: [id number]  HPI Patient seen with progressive dyspnea really over several months. She recently had some increased leg edema with mild weight gain.Marland Kitchen BNP level low 300 range. We set up echocardiogram which showed grade 2 diastolic dysfunction. No major valve problems. Ejection fraction 55 to 60%. Patient was placed on low-dose furosemide 20 mg daily. Her edema is slightly improved. Her weight is down 2 pounds from last visit. Recent electrolytes stable.  Denies any recent chest pains. Patient has never smoked. She is having progressive dyspnea with day-to-day activities and this has greatly limited her walking. Several months ago she was going out and doing some trail walking for exercise. She is now getting more short of breath even with walking within the house. Denies any cough. No fevers or chills.  She does have recent mild anemia with hemoglobin 11 range. MCV 80. Recent B12 normal. No iron studies.  Past Medical History:  Diagnosis Date  . Arthritis   . BACK PAIN 07/18/2008  . Bowel obstruction ([place])   . CONSTIPATION 10/02/2010  . GERD 10/02/2010  . Headache   . IBS (irritable bowel syndrome)   . LAMINECTOMY, LUMBAR, HX OF 06/16/2008  . OSTEOARTHRITIS 10/02/2010  . Pneumonia   . RESTLESS LEG SYNDROME 10/02/2010  . SACROILIAC JOINT DYSFUNCTION 06/16/2008  . Sleep apnea   . STYE 10/02/2010  . TRANSIENT ISCHEMIC ATTACKS, HX OF 10/02/2010   Past Surgical History:  Procedure Laterality Date  . ABDOMINAL HYSTERECTOMY  1979   TAH, fibroids  . [place] / [place]  2005  . APPENDECTOMY  1941  . EYE SURGERY  2005   catarac,both eyes  . LIVER BIOPSY  1997   benign, hemangioma resection  . SPINE SURGERY  2006   laminectomy    reports that she has never smoked. She has never used smokeless tobacco. She reports that she drinks alcohol. She reports that she does not use drugs. family history includes  Breast cancer in her paternal aunt; Diabetes in her father; Pancreatitis in her brother; Stroke in her father. No Known Allergies   Review of Systems  Constitutional: Positive for fatigue.  Respiratory: Positive for shortness of breath. Negative for cough and wheezing.   Cardiovascular: Positive for leg swelling. Negative for chest pain.  Gastrointestinal: Negative for abdominal pain.  Neurological: Negative for dizziness and syncope.  Hematological: Negative for adenopathy.  Psychiatric/Behavioral: Negative for confusion.       Objective:   Physical Exam  Constitutional: She is oriented to person, place, and time. She appears well-developed and well-nourished.  Cardiovascular: Normal rate and regular rhythm.  Pulmonary/Chest: Effort normal and breath sounds normal. She has no wheezes. She has no rales.  Musculoskeletal:  Trace non-pitting leg edema bilateral.    Neurological: She is alert and oriented to person, place, and time.       Assessment:     #1 several month history of progressive dyspnea. Echocardiogram revealed some diastolic dysfunction. Peripheral edema improved with Lasix but dyspnea not improved. Doubt predominantly pulmonary. Pulse oximetry 98%  #2 mild anemia.  MCV 80.  Recent normal B12.  Pt currently on iron - but intolerant.  Not clearly iron deficient.    Plan:     -check further labs with ferritin, TIBC, serum iron -Consider setting up pulmonary function testing -Obtain chest x-ray today -Cardiology referral- would appreciate their input regarding optimization of medications, any further  cardiac work-up ( if indicated), and whether she might be appropriate candidate for cardiac rehab.  Eulas Post MD [facility] at [facility]

## 2018-05-28 LAB — IRON,TIBC AND FERRITIN PANEL
%SAT: 10 % (calc) — ABNORMAL LOW (ref 16–45)
Ferritin: 41 ng/mL (ref 16–288)
Iron: 34 ug/dL — ABNORMAL LOW (ref 45–160)
TIBC: 335 mcg/dL (calc) (ref 250–450)

## 2018-05-28 NOTE — Addendum Note (Signed)
Addended by: Agnes Lawrence on: 05/28/2018 05:12 PM   Modules accepted: Orders

## 2018-06-01 ENCOUNTER — Ambulatory Visit (INDEPENDENT_AMBULATORY_CARE_PROVIDER_SITE_OTHER): Payer: Medicare Other | Admitting: Internal Medicine

## 2018-06-01 DIAGNOSIS — R0600 Dyspnea, unspecified: Secondary | ICD-10-CM

## 2018-06-01 LAB — PULMONARY FUNCTION TEST
DL/VA % pred: 85 %
DL/VA: 4.11 ml/min/mmHg/L
DLCO cor % pred: 61 %
DLCO cor: 14.8 ml/min/mmHg
DLCO unc % pred: 56 %
DLCO unc: 13.75 ml/min/mmHg
FEF 25-75 Post: 1.45 L/sec
FEF 25-75 Pre: 0.96 L/sec
FEF2575-%Change-Post: 52 %
FEF2575-%Pred-Post: 123 %
FEF2575-%Pred-Pre: 80 %
FEV1-%Change-Post: 9 %
FEV1-%Pred-Post: 80 %
FEV1-%Pred-Pre: 72 %
FEV1-Post: 1.43 L
FEV1-Pre: 1.3 L
FEV1FVC-%Change-Post: 1 %
FEV1FVC-%Pred-Pre: 102 %
FEV6-%Change-Post: 8 %
FEV6-%Pred-Post: 82 %
FEV6-%Pred-Pre: 76 %
FEV6-Post: 1.88 L
FEV6-Pre: 1.73 L
FEV6FVC-%Change-Post: 0 %
FEV6FVC-%Pred-Post: 106 %
FEV6FVC-%Pred-Pre: 106 %
FVC-%Change-Post: 8 %
FVC-%Pred-Post: 77 %
FVC-%Pred-Pre: 71 %
FVC-Post: 1.88 L
FVC-Pre: 1.74 L
Post FEV1/FVC ratio: 76 %
Post FEV6/FVC ratio: 100 %
Pre FEV1/FVC ratio: 75 %
Pre FEV6/FVC Ratio: 100 %
RV % pred: 91 %
RV: 2.26 L
TLC % pred: 80 %
TLC: 4.06 L

## 2018-06-01 NOTE — Progress Notes (Signed)
PFT done today. 

## 2018-06-02 NOTE — Telephone Encounter (Signed)
Already mailed  

## 2018-06-02 NOTE — Telephone Encounter (Signed)
Pt called in to follow up on arrival of 3 dipsticks that were suppose to be mailed out to her

## 2018-06-04 ENCOUNTER — Encounter: Payer: Self-pay | Admitting: Cardiology

## 2018-06-08 ENCOUNTER — Other Ambulatory Visit (INDEPENDENT_AMBULATORY_CARE_PROVIDER_SITE_OTHER): Payer: Medicare Other

## 2018-06-08 DIAGNOSIS — D649 Anemia, unspecified: Secondary | ICD-10-CM

## 2018-06-08 LAB — POC HEMOCCULT BLD/STL (HOME/3-CARD/SCREEN)
Card #2 Fecal Occult Blod, POC: NEGATIVE
Card #3 Fecal Occult Blood, POC: NEGATIVE
Fecal Occult Blood, POC: NEGATIVE

## 2018-06-10 ENCOUNTER — Other Ambulatory Visit: Payer: Self-pay

## 2018-06-10 DIAGNOSIS — Z862 Personal history of diseases of the blood and blood-forming organs and certain disorders involving the immune mechanism: Secondary | ICD-10-CM

## 2018-06-16 ENCOUNTER — Telehealth: Payer: Self-pay | Admitting: Oncology

## 2018-06-16 NOTE — Telephone Encounter (Signed)
Scheduled appt per 10/1 sch message - patient is aware of appt date and time.

## 2018-06-18 ENCOUNTER — Encounter: Payer: Self-pay | Admitting: *Deleted

## 2018-06-18 ENCOUNTER — Ambulatory Visit: Payer: Medicare Other | Admitting: Cardiology

## 2018-06-18 ENCOUNTER — Encounter: Payer: Self-pay | Admitting: Cardiology

## 2018-06-18 VITALS — BP 108/58 | HR 79 | Ht 64.5 in | Wt 128.6 lb

## 2018-06-18 DIAGNOSIS — Z01812 Encounter for preprocedural laboratory examination: Secondary | ICD-10-CM | POA: Diagnosis not present

## 2018-06-18 DIAGNOSIS — I77810 Thoracic aortic ectasia: Secondary | ICD-10-CM | POA: Diagnosis not present

## 2018-06-18 DIAGNOSIS — R079 Chest pain, unspecified: Secondary | ICD-10-CM

## 2018-06-18 DIAGNOSIS — R0602 Shortness of breath: Secondary | ICD-10-CM

## 2018-06-18 MED ORDER — FUROSEMIDE 40 MG PO TABS: 40.0000 mg | ORAL_TABLET | Freq: Every day | ORAL | 6 refills | Status: DC

## 2018-06-18 NOTE — Progress Notes (Signed)
Cardiology Office Note:    Date:  06/18/2018   ID:  Briana Fitzgerald, DOB Mar 12, 1932, MRN [id number]  PCP:  Eulas Post, MD  Cardiologist:  No primary care provider on file.  Electrophysiologist:  None   Referring MD: Eulas Post, MD     History of Present Illness:    Briana Fitzgerald is a 84 y.o. female here for the evaluation of dyspnea at the request of Dr. Elease Hashimoto.  In review of office note from 05/27/2018 has been having progressive dyspnea over the past several months increasing leg edema mild weight gain BNP 300 echocardiogram showed normal EF with grade 2 diastolic dysfunction.  Was placed on low-dose furosemide and edema was mildly improved with weight decrease of 2 pounds.  Her shortness of breath seems to be limiting her walking.  Problem started 4 months ago. Feels chest tightness, coulgn ot catch breath at night. Scare. No cough, no fevers.  Minimal anemia with hemoglobin of 11.  Her voice is also harsh. Balance worse.  2 TIA 1974, 81. In [place] walking around did not remember.   Stopped CPAP after bleeding event.  Increased edema.   Father died of CVA with DM. Mother died 28.   Past Medical History:  Diagnosis Date  . Arthritis   . BACK PAIN 07/18/2008  . Bowel obstruction ([place])   . CONSTIPATION 10/02/2010  . GERD 10/02/2010  . Headache   . IBS (irritable bowel syndrome)   . LAMINECTOMY, LUMBAR, HX OF 06/16/2008  . OSTEOARTHRITIS 10/02/2010  . Pneumonia   . RESTLESS LEG SYNDROME 10/02/2010  . SACROILIAC JOINT DYSFUNCTION 06/16/2008  . Sleep apnea   . STYE 10/02/2010  . TRANSIENT ISCHEMIC ATTACKS, HX OF 10/02/2010    Past Surgical History:  Procedure Laterality Date  . ABDOMINAL HYSTERECTOMY  1979   TAH, fibroids  . [facility] / [place]  2005  . APPENDECTOMY  1941  . EYE SURGERY  2005   catarac,both eyes  . LIVER BIOPSY  1997   benign, hemangioma resection  . SPINE SURGERY  2006   laminectomy    Current Medications: Current Meds    Medication Sig  . acetaminophen (TYLENOL) 500 MG tablet Take 500 mg by mouth every 6 (six) hours as needed.  . butalbital-aspirin-caffeine (FIORINAL) 50-325-40 MG capsule Take 1 capsule by mouth every 6 (six) hours as needed for headache.  . Calcium Carbonate-Vitamin D (CALTRATE 600+D) 600-400 MG-UNIT tablet Take 1 tablet by mouth daily.  . furosemide (LASIX) 40 MG tablet Take 1 tablet (40 mg total) by mouth daily.  . Misc Natural Products (TART CHERRY ADVANCED PO) Take 1,200 mg by mouth daily.  . Multiple Vitamins-Minerals (PRESERVISION AREDS PO) Take 1 tablet by mouth daily.  . Omega-3 Fatty Acids (SALMON OIL-1000 PO) Take 1 tablet by mouth daily.  . pramipexole (MIRAPEX) 0.5 MG tablet Take 1 tablet (0.5 mg total) by mouth at bedtime.  . pyridoxine (B-6) 100 MG tablet Take 100 mg by mouth daily.  . vitamin B-12 (CYANOCOBALAMIN) 500 MCG tablet Take 500 mcg by mouth daily.  . vitamin C (ASCORBIC ACID) 500 MG tablet Take 500 mg by mouth daily.  . vitamin E 400 UNIT capsule Take 400 Units by mouth daily.  . [DISCONTINUED] furosemide (LASIX) 20 MG tablet Take 1 tablet (20 mg total) by mouth daily.     Allergies:   Patient has no known allergies.   Social History   Socioeconomic History  . Marital status: Married  Spouse name: Not on file  . Number of children: 2  . Years of education: Not on file  . Highest education level: Not on file  Occupational History  . Occupation: retired  Scientific laboratory technician  . Financial resource strain: Not on file  . Food insecurity:    Worry: Not on file    Inability: Not on file  . Transportation needs:    Medical: Not on file    Non-medical: Not on file  Tobacco Use  . Smoking status: Never Smoker  . Smokeless tobacco: Never Used  Substance and Sexual Activity  . Alcohol use: Yes    Comment: at dinner with friends, 2-3 per month - wine  . Drug use: No  . Sexual activity: Not on file  Lifestyle  . Physical activity:    Days per week: Not on file     Minutes per session: Not on file  . Stress: Not on file  Relationships  . Social connections:    Talks on phone: Not on file    Gets together: Not on file    Attends religious service: Not on file    Active member of club or organization: Not on file    Attends meetings of clubs or organizations: Not on file    Relationship status: Not on file  Other Topics Concern  . Not on file  Social History Narrative  . Not on file     Family History: The patient's family history includes Breast cancer in her paternal aunt; Diabetes in her father; Pancreatitis in her brother; Stroke in her father. There is no history of Colon cancer, Stomach cancer, Rectal cancer, Liver cancer, or Esophageal cancer.  ROS:   Please see the history of present illness.    Positive for shortness of breath, back pain, dizziness, snoring, fatigue, occasional chest pain all other systems reviewed and are negative.  EKGs/Labs/Other Studies Reviewed:    The following studies were reviewed today:  Echocardiogram 05/19/2018: - Left ventricle: The cavity size was normal. Systolic function was   normal. The estimated ejection fraction was in the range of 55%   to 60%. Wall motion was normal; there were no regional wall   motion abnormalities. Features are consistent with a pseudonormal   left ventricular filling pattern, with concomitant abnormal   relaxation and increased filling pressure (grade 2 diastolic   dysfunction). - Aortic valve: There was trivial regurgitation. - Mitral valve: Calcified annulus. Mildly thickened leaflets . - Pulmonary arteries: Systolic pressure was mildly increased. PA   peak pressure: 36 mm Hg (S).   EKG:  EKG is  ordered today.  The ekg ordered today demonstrates June 18, 2018 normal sinus rhythm 79 with no other significant abnormalities.  Recent Labs: 03/03/2018: TSH 3.22 05/06/2018: ALT 17; Hemoglobin 11.3; Platelets 114.0; Pro B Natriuretic peptide (BNP) 304.0 05/11/2018: BUN  16; Creatinine, Ser 0.88; Potassium 4.4; Sodium 132  Recent Lipid Panel    Component Value Date/Time   CHOL 138 01/28/2014 0955   TRIG 76.0 01/28/2014 0955   HDL 36.10 (L) 01/28/2014 0955   CHOLHDL 4 01/28/2014 0955   VLDL 15.2 01/28/2014 0955   LDLCALC 87 01/28/2014 0955    Physical Exam:    VS:  BP (!) 108/58   Pulse 79   Ht 5' 4.5" (1.638 m)   Wt 128 lb 9.6 oz (58.3 kg)   SpO2 95%   BMI 21.73 kg/m     Wt Readings from Last 3 Encounters:  06/18/18 128  lb 9.6 oz (58.3 kg)  05/27/18 129 lb 4.8 oz (58.7 kg)  05/11/18 131 lb 6.4 oz (59.6 kg)     GEN:  Well nourished, well developed in no acute distress HEENT: Normal NECK: No JVD; No carotid bruits LYMPHATICS: No lymphadenopathy CARDIAC: RRR, 2/6 SM, no rubs, gallops RESPIRATORY:  Clear to auscultation without rales, wheezing or rhonchi  ABDOMEN: Soft, non-tender, non-distended MUSCULOSKELETAL:  2+ LE edema; No deformity  SKIN: Warm and dry NEUROLOGIC:  Alert and oriented x 3 PSYCHIATRIC:  Normal affect   ASSESSMENT:    1. Chest pain, unspecified type   2. Shortness of breath   3. Pre-procedure lab exam   4. Aortic root dilatation (HCC)    PLAN:    In order of problems listed above:  Shortness of breath, acute diastolic heart failure -I will increase her Lasix from 20 to 40 mg once a day. -Her weight is decreasing but she still has more fluid to go.  Edema noted.  Chest pain/angina - Has had episode of chest tightness periodically.  She also has hoarseness to her voice which is new over the past 4 months.  I will check a CT of her chest to evaluate her aorta to make sure that she does not have any evidence of marked dilation causing changes to her recurrent laryngeal nerve.  Echocardiogram did show a mildly dilated aortic root of 39 mm previously. -I will also check a pharmacologic stress test to see if she has any high risk ischemia.  I mentioned to her the possibility of heart catheterization, angiogram given  her advanced age, higher pretest probability for coronary artery disease especially given her aortic and iliac atherosclerosis and symptoms.  She would not like to proceed with invasive approach first but we can talk about that if necessary in the future.  Aortic atherosclerosis - Continue with prevention     Medication Adjustments/Labs and Tests Ordered: Current medicines are reviewed at length with the patient today.  Concerns regarding medicines are outlined above.  Orders Placed This Encounter  Procedures  . CT ANGIO CHEST AORTA W &/OR WO CONTRAST  . Basic metabolic panel  . MYOCARDIAL PERFUSION IMAGING  . EKG 12-Lead   Meds ordered this encounter  Medications  . furosemide (LASIX) 40 MG tablet    Sig: Take 1 tablet (40 mg total) by mouth daily.    Dispense:  30 tablet    Refill:  6    Patient Instructions  Medication Instructions:  Please increase your Furosemide to 40 mg a day.  Labwork: Please have blood work drawn today (BMP)  Testing/Procedures: Your physician has requested that you have a lexiscan myoview. For further information please visit [email or web address]. Please follow instruction sheet, as given.  Your physician has requested that you have cardiac CT. Cardiac computed tomography (CT) is a painless test that uses an x-ray machine to take clear, detailed pictures of your heart. For further information please visit [email or web address].   Follow-Up: Follow up in 1 month with Dr Marlou Porch  If you need a refill on your cardiac medications before your next appointment, please call your pharmacy.  Thank you for choosing [facility]!!        Signed, Candee Furbish, MD  06/18/2018 5:13 PM    [facility]

## 2018-06-18 NOTE — Patient Instructions (Signed)
Medication Instructions:  Please increase your Furosemide to 40 mg a day.  Labwork: Please have blood work drawn today (BMP)  Testing/Procedures: Your physician has requested that you have a lexiscan myoview. For further information please visit [email or web address]. Please follow instruction sheet, as given.  Your physician has requested that you have cardiac CT. Cardiac computed tomography (CT) is a painless test that uses an x-ray machine to take clear, detailed pictures of your heart. For further information please visit [email or web address].   Follow-Up: Follow up in 1 month with Dr Marlou Porch  If you need a refill on your cardiac medications before your next appointment, please call your pharmacy.  Thank you for choosing [facility]!!

## 2018-06-19 ENCOUNTER — Telehealth: Payer: Self-pay

## 2018-06-19 LAB — BASIC METABOLIC PANEL
BUN/Creatinine Ratio: 20 (ref 12–28)
BUN: 19 mg/dL (ref 8–27)
CO2: 24 mmol/L (ref 20–29)
Calcium: 10.1 mg/dL (ref 8.7–10.3)
Chloride: 102 mmol/L (ref 96–106)
Creatinine, Ser: 0.95 mg/dL (ref 0.57–1.00)
GFR calc Af Amer: 64 mL/min/{1.73_m2} (ref >59)
GFR calc non Af Amer: 55 mL/min/{1.73_m2} — ABNORMAL LOW (ref >59)
Glucose: 123 mg/dL — ABNORMAL HIGH (ref 65–99)
Potassium: 4.4 mmol/L (ref 3.5–5.2)
Sodium: 140 mmol/L (ref 134–144)

## 2018-06-19 NOTE — Telephone Encounter (Signed)
-----   Message from Jerline Pain, MD sent at 06/19/2018  6:44 AM EDT ----- Labs ok for CT Candee Furbish, MD

## 2018-06-19 NOTE — Telephone Encounter (Signed)
Notes recorded by Frederik Schmidt, RN on 06/19/2018 at 8:00 AM EDT Informed patient of lab results/recommendations. She verbalized understanding. ------

## 2018-06-20 ENCOUNTER — Other Ambulatory Visit: Payer: Self-pay | Admitting: Family Medicine

## 2018-06-22 NOTE — Telephone Encounter (Signed)
Ok to refill with one additional refill.

## 2018-06-22 NOTE — Telephone Encounter (Signed)
Please advise if okay to fill?  Not on current medication list but has taken in the past.

## 2018-07-01 ENCOUNTER — Encounter (HOSPITAL_COMMUNITY): Payer: Medicare Other

## 2018-07-02 ENCOUNTER — Telehealth: Payer: Self-pay | Admitting: Cardiology

## 2018-07-02 ENCOUNTER — Ambulatory Visit (INDEPENDENT_AMBULATORY_CARE_PROVIDER_SITE_OTHER)
Admission: RE | Admit: 2018-07-02 | Discharge: 2018-07-02 | Disposition: A | Discharge status: Home / Self Care | Payer: Medicare Other | Source: Ambulatory Visit | Attending: Cardiology | Admitting: Cardiology

## 2018-07-02 ENCOUNTER — Encounter (HOSPITAL_COMMUNITY): Payer: Medicare Other

## 2018-07-02 DIAGNOSIS — R079 Chest pain, unspecified: Secondary | ICD-10-CM

## 2018-07-02 DIAGNOSIS — I77810 Thoracic aortic ectasia: Secondary | ICD-10-CM | POA: Diagnosis not present

## 2018-07-02 DIAGNOSIS — I712 Thoracic aortic aneurysm, without rupture: Secondary | ICD-10-CM | POA: Diagnosis not present

## 2018-07-02 MED ORDER — IOPAMIDOL (ISOVUE-370) INJECTION 76%
100.0000 mL | Freq: Once | INTRAVENOUS | Status: AC | PRN
  Administered 2018-07-02: 80 mL via INTRAVENOUS

## 2018-07-02 NOTE — Telephone Encounter (Signed)
Briana Fitzgerald from [facility] called to notify Dr Marlou Porch of the following: Visualized upper abdomen shows significant splenomegaly and potentially left-sided upper abdominal retroperitoneal lymphadenopathy. Underlying hematologic disease such as leukemia or lymphoma cannot be excluded. Recommend formal CT of the abdomen and pelvis with contrast.  Result is in his inbasket to review.

## 2018-07-02 NOTE — Telephone Encounter (Signed)
New message:      Olivia Mackie calling from [facility] to give a CT scan results

## 2018-07-03 NOTE — Telephone Encounter (Signed)
Reviewed results of testing with pt who reports she has know about the spleen enlargement for some time now.  She had CT of abdomen done 04/12/2016 that demonstrated the results below: Spleen: Spleen is significantly enlarged measuring approximately 21.7 cm in greatest craniocaudad dimension and approximately 15.4 cm in greatest AP dimension. No mass lesion is identified. 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 No definitive gastric or esophageal varices are noted  Advised I will have Dr Marlou Porch to review to see if she needs further testing at this time and will c/b to make her aware.

## 2018-07-03 NOTE — Telephone Encounter (Signed)
Have attempted to call patient X 3 and continue to receive a fast busy signal.  Will continue to try to reach her.

## 2018-07-03 NOTE — Telephone Encounter (Signed)
Thankfully, no significant dilation of thoracic aorta. Minimal aortic root dilatation at 3.9 cm. Coronary artery calcification noted in LAD and left circumflex. There was a finding within the upper abdomen of splenomegaly. Radiologist recommended a formal CT of the abdomen and pelvis with contrast. Please go ahead and order this study to complete the evaluation. We will forward the results to Dr. Jarold Song as well.  Candee Furbish, MD

## 2018-07-06 ENCOUNTER — Telehealth: Payer: Self-pay

## 2018-07-06 NOTE — Telephone Encounter (Signed)
FYI, Briana Fitzgerald came to [facility]. Stated she was seeing cardiology and was ordered test.  Had the CT angio chest aorta but cancelled her Myocardial perfusion.  This may have been somewhat related to cost at 200 to 500.00 out of pocket, but also stated she was dx with Leukemia and is seeing Dr. Joyice Faster Nov 6 or 7th.  She will make an apt with you after she see oncologist.  Please let her know your thoughts regarding her plan and cancellation of cardiac test.  [name]

## 2018-07-06 NOTE — Telephone Encounter (Signed)
I will defer any further testing to her primary care physician, Dr. Jarold Song.  Thank you for making me aware of her prior abdominal CT in 2017.  The reading radiologist did not mention this prior study. I will forward this to Dr. Jarold Song as well.  Candee Furbish, MD

## 2018-07-07 NOTE — Telephone Encounter (Signed)
Discussed issue with Dr. Elease Hashimoto. Agrees to wait for further cardiac work up until the patient is evaluated by oncologist   Heart issues were a part of her work up for symptoms which may be related to her blood work.   Wynetta Fines RN

## 2018-07-07 NOTE — Telephone Encounter (Signed)
Great!

## 2018-07-07 NOTE — Telephone Encounter (Signed)
I see that this patient has an appointment on 07/23/18 with Dr. Alen Blew.

## 2018-07-07 NOTE — Telephone Encounter (Signed)
Pt aware to f/u with Dr Alen Blew as scheduled 07/23/18.

## 2018-07-07 NOTE — Telephone Encounter (Signed)
Pt has pending follow up with Hem/onc (referral made late September).  Has seen Dr Alen Blew in past.  She has splenomegaly on recent CT with ? Of some lymphadenopathy as well.  We need to check on status of follow up with hem-onc.

## 2018-07-23 ENCOUNTER — Inpatient Hospital Stay: Payer: Medicare Other | Attending: Oncology | Admitting: Oncology

## 2018-07-23 VITALS — BP 137/49 | HR 79 | Temp 97.6°F | Resp 18 | Ht 64.5 in | Wt 131.5 lb

## 2018-07-23 DIAGNOSIS — C9110 Chronic lymphocytic leukemia of B-cell type not having achieved remission: Secondary | ICD-10-CM | POA: Diagnosis not present

## 2018-07-23 DIAGNOSIS — D696 Thrombocytopenia, unspecified: Secondary | ICD-10-CM | POA: Insufficient documentation

## 2018-07-23 DIAGNOSIS — R0609 Other forms of dyspnea: Secondary | ICD-10-CM | POA: Insufficient documentation

## 2018-07-23 DIAGNOSIS — Z7982 Long term (current) use of aspirin: Secondary | ICD-10-CM | POA: Insufficient documentation

## 2018-07-23 DIAGNOSIS — Z79899 Other long term (current) drug therapy: Secondary | ICD-10-CM | POA: Insufficient documentation

## 2018-07-23 DIAGNOSIS — D509 Iron deficiency anemia, unspecified: Secondary | ICD-10-CM

## 2018-07-23 NOTE — Progress Notes (Signed)
Hematology and Oncology Follow Up Visit  Briana Fitzgerald [id number] 07-24-1932 84 y.o. 07/23/2018 3:52 PM [name], [name], MDBurchette, [name], MD   Principle Diagnosis: 84 year old woman with CLLdiagnosed in July 2017.  She presented with lymphocytosis, splenomegaly and mild lymphadenopathy.    Current therapy: Active surveillance.  Interim History: Briana Fitzgerald returns today for a repeat evaluation.  Since the last visit, she reports no major changes in her health.  She does report respiratory complaints with dyspnea on exertion and shortness of breath during the summer months.  He also had a CT scan of the chest on July 02, 2018 for evaluation which did not show any pulmonary pathology did confirm the presence of her splenomegaly as well as left-sided upper abdominal adenopathy.  She does not report any other palpable adenopathy, weight loss or changes in her performance status.  She does report respiratory complaints has improved slightly.   She does not report any headaches, blurry vision, syncope or seizures.  She denies any alteration in mental status or confusion.  She does not report any fevers or chills or sweats. He does not report any cough, wheezing or hemoptysis. She does not report any nausea, vomiting or abdominal pain.  She denies any change in her bowel habits.  He does not report any frequency urgency or hesitancy. She does not report any arthralgias or myalgias. Remaining review of systems is negative.  Medications: I have reviewed the patient's current medications.  Current Outpatient Medications  Medication Sig Dispense Refill  . acetaminophen (TYLENOL) 500 MG tablet Take 500 mg by mouth every 6 (six) hours as needed.    . butalbital-aspirin-caffeine (FIORINAL) 50-325-40 MG capsule Take 1 capsule by mouth every 6 (six) hours as needed for headache. 30 capsule 0  . Calcium Carbonate-Vitamin D (CALTRATE 600+D) 600-400 MG-UNIT tablet Take 1 tablet by mouth daily.    .  furosemide (LASIX) 40 MG tablet Take 1 tablet (40 mg total) by mouth daily. 30 tablet 6  . lansoprazole (PREVACID) 30 MG capsule TAKE 1 CAPSULE DAILY 90 capsule 1  . Misc Natural Products (TART CHERRY ADVANCED PO) Take 1,200 mg by mouth daily.    . Multiple Vitamins-Minerals (PRESERVISION AREDS PO) Take 1 tablet by mouth daily.    . Omega-3 Fatty Acids (SALMON OIL-1000 PO) Take 1 tablet by mouth daily.    . pramipexole (MIRAPEX) 0.5 MG tablet Take 1 tablet (0.5 mg total) by mouth at bedtime. 90 tablet 3  . pyridoxine (B-6) 100 MG tablet Take 100 mg by mouth daily.    . vitamin B-12 (CYANOCOBALAMIN) 500 MCG tablet Take 500 mcg by mouth daily.    . vitamin C (ASCORBIC ACID) 500 MG tablet Take 500 mg by mouth daily.    . vitamin E 400 UNIT capsule Take 400 Units by mouth daily.     No current facility-administered medications for this visit.      Allergies: No Known Allergies  Past Medical History, Surgical history, Social history, and Family History were reviewed and updated.   Physical Exam: Blood pressure (!) 137/49, pulse 79, temperature 97.6 F (36.4 C), temperature source Oral, resp. rate 18, height 5' 4.5" (1.638 m), weight 131 lb 8 oz (59.6 kg), SpO2 98 %.   ECOG: 1   General appearance: Alert, awake without any distress. Head: Atraumatic without abnormalities Oropharynx: Without any thrush or ulcers. Eyes: No scleral icterus. Lymph nodes: No lymphadenopathy noted in the cervical, supraclavicular, or axillary nodes Heart:regular rate and rhythm, without any  murmurs or gallops.   Lung: Clear to auscultation without any rhonchi, wheezes or dullness to percussion. Abdomin: Soft, nontender without any shifting dullness or ascites. Musculoskeletal: No clubbing or cyanosis. Neurological: No motor or sensory deficits. Skin: No rashes or lesions.    Lab Results: Lab Results  Component Value Date   WBC 8.0 05/06/2018   HGB 11.3 (L) 05/06/2018   HCT 33.3 (L) 05/06/2018    MCV 80.3 05/06/2018   PLT 114.0 (L) 05/06/2018     Chemistry      Component Value Date/Time   NA 140 06/18/2018 1655   NA 138 10/15/2016 1132   K 4.4 06/18/2018 1655   K 4.0 10/15/2016 1132   CL 102 06/18/2018 1655   CO2 24 06/18/2018 1655   CO2 25 10/15/2016 1132   BUN 19 06/18/2018 1655   BUN 14.1 10/15/2016 1132   CREATININE 0.95 06/18/2018 1655   CREATININE 0.9 10/15/2016 1132      Component Value Date/Time   CALCIUM 10.1 06/18/2018 1655   CALCIUM 10.5 (H) 10/15/2016 1132   ALKPHOS 69 05/06/2018 1450   ALKPHOS 88 10/15/2016 1132   AST 28 05/06/2018 1450   AST 28 10/15/2016 1132   ALT 17 05/06/2018 1450   ALT 22 10/15/2016 1132   BILITOT 1.4 (H) 05/06/2018 1450   BILITOT 1.20 10/15/2016 1132      CLINICAL DATA:  Dilatation of the ascending thoracic aorta by echocardiography. New hoarseness of voice. History of heart failure.  EXAM: CT ANGIOGRAPHY CHEST WITH CONTRAST  TECHNIQUE: Multidetector CT imaging of the chest was performed using the standard protocol during bolus administration of intravenous contrast. Multiplanar CT image reconstructions and MIPs were obtained to evaluate the vascular anatomy.  CONTRAST:  60mL ISOVUE-370 IOPAMIDOL (ISOVUE-370) INJECTION 76%  COMPARISON:  Echocardiography report on 05/19/2018  FINDINGS: Cardiovascular: The aortic root at the level of the sinuses of Valsalva measures 3.3 cm. The ascending thoracic aorta is top-normal in caliber measuring 3.9 cm in greatest diameter. The aortic arch measures 2.8 cm and the descending thoracic aorta 2.6 cm. No evidence of aortic dissection. Mild calcified plaque is present at the level of the aortic arch. Proximal great vessels show normal patency and branching pattern.  Central pulmonary arteries are dilated with the main pulmonary artery measuring 4.1 cm in greatest diameter. This is suggestive of underlying pulmonary hypertension. The heart size is mildly enlarged. No  pericardial fluid identified. Mildly calcified plaque present in the distribution of the LAD and left circumflex coronary arteries.  Mediastinum/Nodes: Calcified lymph nodes in the subcarinal and right hilar stations consistent with prior granulomatous disease. No enlarged lymph nodes identified. No mediastinal masses.  Lungs/Pleura: Scattered scarring and pleural thickening bilaterally. No evidence of pulmonary nodule or airspace consolidation. No pneumothorax or pleural fluid. Mildly prominent caliber of pulmonary veins is suggestive of underlying pulmonary venous hypertension. No overt pulmonary edema.  Upper Abdomen: Clips related to prior surgery potentially involving the stomach or left lobe of the liver. The spleen is incompletely imaged but shows clear enlargement and is felt to likely be moderate to severely enlarged. Correlation suggested with any prior known history of splenomegaly or hematologic disease. On the very lower most images, suspect potential enlarged left para-aortic retroperitoneal lymph nodes.  Musculoskeletal: No chest wall abnormality. No acute or significant osseous findings.  Review of the MIP images confirms the above findings.  IMPRESSION: 1. No significant aneurysmal disease of the thoracic aorta. The ascending thoracic aorta is top-normal in caliber measuring 3.9  cm in greatest diameter. 2. Coronary atherosclerosis with mildly calcified plaque in the distribution of the LAD and left circumflex coronary arteries. 3. Probable underlying pulmonary hypertension with dilated main pulmonary artery. 4. Evidence of probable pulmonary venous hypertension without overt pulmonary edema. 5. Visualized upper abdomen shows significant splenomegaly and potentially left-sided upper abdominal retroperitoneal lymphadenopathy. Underlying hematologic disease such as leukemia or lymphoma cannot be excluded. Recommend formal CT of the abdomen and pelvis with  contrast.  Impression and Plan:  84 year old woman with:  1.  CLL diagnosed in 2017.  She presented with lymphocytosis and found to have splenomegaly on imaging studies dating back to 2017.  Her most recent imaging studies obtained on 07/02/2018 showed persistent splenomegaly and lymphadenopathy that has not dramatically changed.  Laboratory data obtained in August 2019 showed lymphocytosis with normal white cell count otherwise.  Hemoglobin is around 11 with a platelet count of 114 which is consistent with her baseline.  The natural course of this disease was reviewed today and treatment options were discussed.  Given the fact that her disease is rather indolent and asymptomatic there is no clear cut indication for treatment at this time.  Indication for treatment would include symptomatic lymphadenopathy, cytopenias, constitutional symptoms or recurrent infections.  At this time, I see no indication for treatment at this time.  I recommended periodic monitoring with clinical evaluation and repeat laboratory testing.  Or questions were answered today to her satisfaction.   2.Thrombocytopenia: Platelets remain relatively stable without any active bleeding.  We will continue to monitor closely.  3.  Iron deficiency anemia: She has been instructed to take oral iron although she has been intolerant to it.  I will recheck her iron studies today and we have discussed the role of intravenous iron in this particular setting.  Risks and benefits of this approach and complications associated with intravenous iron was discussed today.  These complications include arthralgias, myalgias and rarely anaphylaxis was reviewed.  She would be willing to proceed with that if her iron stores are indeed low.  3. Follow-up: Will be in 4 months to follow her progress.  25  minutes was spent with the patient face-to-face today.  More than 50% of time was dedicated to reviewing disease status, laboratory data and  coordinating plan of care.    Zola Button, MD 11/7/20193:52 PM

## 2018-07-24 ENCOUNTER — Telehealth: Payer: Self-pay | Admitting: *Deleted

## 2018-07-24 ENCOUNTER — Inpatient Hospital Stay: Payer: Medicare Other

## 2018-07-24 DIAGNOSIS — D509 Iron deficiency anemia, unspecified: Secondary | ICD-10-CM | POA: Diagnosis not present

## 2018-07-24 DIAGNOSIS — D696 Thrombocytopenia, unspecified: Secondary | ICD-10-CM | POA: Diagnosis not present

## 2018-07-24 DIAGNOSIS — Z79899 Other long term (current) drug therapy: Secondary | ICD-10-CM | POA: Diagnosis not present

## 2018-07-24 DIAGNOSIS — C9110 Chronic lymphocytic leukemia of B-cell type not having achieved remission: Secondary | ICD-10-CM | POA: Diagnosis not present

## 2018-07-24 DIAGNOSIS — Z7982 Long term (current) use of aspirin: Secondary | ICD-10-CM | POA: Diagnosis not present

## 2018-07-24 DIAGNOSIS — R0609 Other forms of dyspnea: Secondary | ICD-10-CM | POA: Diagnosis not present

## 2018-07-24 LAB — IRON AND TIBC
Iron: 50 ug/dL (ref 41–142)
Saturation Ratios: 17 % — ABNORMAL LOW (ref 21–57)
TIBC: 294 ug/dL (ref 236–444)
UIBC: 244 ug/dL (ref 120–384)

## 2018-07-24 LAB — CMP (CANCER CENTER ONLY)
ALT: 18 U/L (ref 0–44)
AST: 32 U/L (ref 15–41)
Albumin: 3.3 g/dL — ABNORMAL LOW (ref 3.5–5.0)
Alkaline Phosphatase: 77 U/L (ref 38–126)
Anion gap: 6 (ref 5–15)
BUN: 13 mg/dL (ref 8–23)
CO2: 28 mmol/L (ref 22–32)
Calcium: 9.9 mg/dL (ref 8.9–10.3)
Chloride: 106 mmol/L (ref 98–111)
Creatinine: 0.84 mg/dL (ref 0.44–1.00)
GFR, Est AFR Am: >60 mL/min (ref >60)
GFR, Estimated: >60 mL/min (ref >60)
Glucose, Bld: 133 mg/dL — ABNORMAL HIGH (ref 70–99)
Potassium: 4.1 mmol/L (ref 3.5–5.1)
Sodium: 140 mmol/L (ref 135–145)
Total Bilirubin: 1.3 mg/dL — ABNORMAL HIGH (ref 0.3–1.2)
Total Protein: 6.7 g/dL (ref 6.5–8.1)

## 2018-07-24 LAB — CBC WITH DIFFERENTIAL (CANCER CENTER ONLY)
Abs Immature Granulocytes: 0.06 10*3/uL (ref 0.00–0.07)
Basophils Absolute: 0 10*3/uL (ref 0.0–0.1)
Basophils Relative: 0 %
Eosinophils Absolute: 0.1 10*3/uL (ref 0.0–0.5)
Eosinophils Relative: 1 %
HCT: 34.4 % — ABNORMAL LOW (ref 36.0–46.0)
Hemoglobin: 11 g/dL — ABNORMAL LOW (ref 12.0–15.0)
Immature Granulocytes: 1 %
Lymphocytes Relative: 60 %
Lymphs Abs: 4.4 10*3/uL — ABNORMAL HIGH (ref 0.7–4.0)
MCH: 26.8 pg (ref 26.0–34.0)
MCHC: 32 g/dL (ref 30.0–36.0)
MCV: 83.7 fL (ref 80.0–100.0)
Monocytes Absolute: 0.3 10*3/uL (ref 0.1–1.0)
Monocytes Relative: 5 %
Neutro Abs: 2.4 10*3/uL (ref 1.7–7.7)
Neutrophils Relative %: 33 %
Platelet Count: 106 10*3/uL — ABNORMAL LOW (ref 150–400)
RBC: 4.11 MIL/uL (ref 3.87–5.11)
RDW: 16.4 % — ABNORMAL HIGH (ref 11.5–15.5)
WBC Count: 7.3 10*3/uL (ref 4.0–10.5)
nRBC: 0 % (ref 0.0–0.2)

## 2018-07-24 LAB — FERRITIN: Ferritin: 44 ng/mL (ref 11–307)

## 2018-07-24 NOTE — Telephone Encounter (Signed)
Spoke with husband. Per dr Alen Blew, labs are good, no need for iron infusion.

## 2018-07-24 NOTE — Telephone Encounter (Signed)
-----   Message from Wyatt Portela, MD sent at 07/24/2018 12:11 PM EST ----- Please let her know her iron is normal. No need for iron supplement.

## 2018-07-29 ENCOUNTER — Other Ambulatory Visit: Payer: Self-pay | Admitting: Family Medicine

## 2018-08-04 ENCOUNTER — Ambulatory Visit (INDEPENDENT_AMBULATORY_CARE_PROVIDER_SITE_OTHER): Payer: Medicare Other | Admitting: Cardiology

## 2018-08-04 ENCOUNTER — Encounter: Payer: Self-pay | Admitting: Cardiology

## 2018-08-04 VITALS — BP 110/50 | HR 83 | Ht 64.5 in | Wt 127.4 lb

## 2018-08-04 DIAGNOSIS — R0602 Shortness of breath: Secondary | ICD-10-CM

## 2018-08-04 DIAGNOSIS — R079 Chest pain, unspecified: Secondary | ICD-10-CM

## 2018-08-04 DIAGNOSIS — I77810 Thoracic aortic ectasia: Secondary | ICD-10-CM

## 2018-08-04 MED ORDER — ROSUVASTATIN CALCIUM 5 MG PO TABS: 5.0000 mg | ORAL_TABLET | Freq: Every day | ORAL | 3 refills | Status: DC

## 2018-08-04 NOTE — Progress Notes (Signed)
Cardiology Office Note:    Date:  08/04/2018   ID:  Briana Fitzgerald, DOB [birth year], MRN [id number]  PCP:  Briana Post, MD  Cardiologist:  No primary care provider on file.  Electrophysiologist:  None   Referring MD: Briana Post, MD     History of Present Illness:    Briana Fitzgerald is a 84 y.o. female here for the evaluation of dyspnea at the request of Dr. Elease Fitzgerald.  In review of office note from 05/27/2018 has been having progressive dyspnea over the past several months increasing leg edema mild weight gain BNP 300 echocardiogram showed normal EF with grade 2 diastolic dysfunction.  Was placed on low-dose furosemide and edema was mildly improved with weight decrease of 2 pounds.  Her shortness of breath seems to be limiting her walking.  Problem started 4 months ago. Feels chest tightness, coulgn ot catch breath at night. Scare. No cough, no fevers.  Minimal anemia with hemoglobin of 11.  Her voice is also harsh. Balance worse.  2 TIA 1974, 81. In [place] walking around did not remember.   Stopped CPAP after bleeding event.  Increased edema.   Father died of CVA with DM. Mother died 33.   Past Medical History:  Diagnosis Date  . Arthritis   . BACK PAIN 07/18/2008  . Bowel obstruction ([place])   . CONSTIPATION 10/02/2010  . GERD 10/02/2010  . Headache   . IBS (irritable bowel syndrome)   . LAMINECTOMY, LUMBAR, HX OF 06/16/2008  . OSTEOARTHRITIS 10/02/2010  . Pneumonia   . RESTLESS LEG SYNDROME 10/02/2010  . SACROILIAC JOINT DYSFUNCTION 06/16/2008  . Sleep apnea   . STYE 10/02/2010  . TRANSIENT ISCHEMIC ATTACKS, HX OF 10/02/2010    Past Surgical History:  Procedure Laterality Date  . ABDOMINAL HYSTERECTOMY  1979   TAH, fibroids  . [place] / [place]  2005  . APPENDECTOMY  1941  . EYE SURGERY  2005   catarac,both eyes  . LIVER BIOPSY  1997   benign, hemangioma resection  . SPINE SURGERY  2006   laminectomy    Current Medications: Current Meds    Medication Sig  . acetaminophen (TYLENOL) 500 MG tablet Take 500 mg by mouth every 6 (six) hours as needed.  . butalbital-aspirin-caffeine (FIORINAL) 50-325-40 MG capsule Take 1 capsule by mouth every 6 (six) hours as needed for headache.  . Calcium Carbonate-Vitamin D (CALTRATE 600+D) 600-400 MG-UNIT tablet Take 1 tablet by mouth daily.  . furosemide (LASIX) 40 MG tablet Take 1 tablet (40 mg total) by mouth daily.  . Misc Natural Products (TART CHERRY ADVANCED PO) Take 1,200 mg by mouth daily.  . Omega-3 Fatty Acids (SALMON OIL-1000 PO) Take 1 tablet by mouth daily.  . pramipexole (MIRAPEX) 0.5 MG tablet Take 1 tablet (0.5 mg total) by mouth at bedtime.  . pyridoxine (B-6) 100 MG tablet Take 100 mg by mouth daily.  . vitamin B-12 (CYANOCOBALAMIN) 500 MCG tablet Take 500 mcg by mouth daily.  . vitamin C (ASCORBIC ACID) 500 MG tablet Take 500 mg by mouth daily.  . vitamin E 400 UNIT capsule Take 400 Units by mouth daily.     Allergies:   Patient has no known allergies.   Social History   Socioeconomic History  . Marital status: Married    Spouse name: Not on file  . Number of children: 2  . Years of education: Not on file  . Highest education level: Not on file  Occupational History  .  Occupation: retired  Scientific laboratory technician  . Financial resource strain: Not on file  . Food insecurity:    Worry: Not on file    Inability: Not on file  . Transportation needs:    Medical: Not on file    Non-medical: Not on file  Tobacco Use  . Smoking status: Never Smoker  . Smokeless tobacco: Never Used  Substance and Sexual Activity  . Alcohol use: Yes    Comment: at dinner with friends, 2-3 per month - wine  . Drug use: No  . Sexual activity: Not on file  Lifestyle  . Physical activity:    Days per week: Not on file    Minutes per session: Not on file  . Stress: Not on file  Relationships  . Social connections:    Talks on phone: Not on file    Gets together: Not on file    Attends  religious service: Not on file    Active member of club or organization: Not on file    Attends meetings of clubs or organizations: Not on file    Relationship status: Not on file  Other Topics Concern  . Not on file  Social History Narrative  . Not on file     Family History: The patient's family history includes Breast cancer in her paternal aunt; Diabetes in her father; Pancreatitis in her brother; Stroke in her father. There is no history of Colon cancer, Stomach cancer, Rectal cancer, Liver cancer, or Esophageal cancer.  ROS:   Please see the history of present illness.    Positive for shortness of breath, back pain, dizziness, snoring, fatigue, occasional chest pain all other systems reviewed and are negative.  EKGs/Labs/Other Studies Reviewed:    The following studies were reviewed today:  Echocardiogram 05/19/2018: - Left ventricle: The cavity size was normal. Systolic function was   normal. The estimated ejection fraction was in the range of 55%   to 60%. Wall motion was normal; there were no regional wall   motion abnormalities. Features are consistent with a pseudonormal   left ventricular filling pattern, with concomitant abnormal   relaxation and increased filling pressure (grade 2 diastolic   dysfunction). - Aortic valve: There was trivial regurgitation. - Mitral valve: Calcified annulus. Mildly thickened leaflets . - Pulmonary arteries: Systolic pressure was mildly increased. PA   peak pressure: 36 mm Hg (S).   EKG:  EKG is  ordered today.  The ekg ordered today demonstrates June 18, 2018 normal sinus rhythm 79 with no other significant abnormalities.  Recent Labs: 03/03/2018: TSH 3.22 05/06/2018: Pro B Natriuretic peptide (BNP) 304.0 07/24/2018: ALT 18; BUN 13; Creatinine 0.84; Hemoglobin 11.0; Platelet Count 106; Potassium 4.1; Sodium 140  Recent Lipid Panel    Component Value Date/Time   CHOL 138 01/28/2014 0955   TRIG 76.0 01/28/2014 0955   HDL 36.10 (L)  01/28/2014 0955   CHOLHDL 4 01/28/2014 0955   VLDL 15.2 01/28/2014 0955   LDLCALC 87 01/28/2014 0955    Physical Exam:    VS:  BP (!) 110/50   Pulse 83   Ht 5' 4.5" (1.638 m)   Wt 127 lb 6.4 oz (57.8 kg)   SpO2 97%   BMI 21.53 kg/m     Wt Readings from Last 3 Encounters:  08/04/18 127 lb 6.4 oz (57.8 kg)  07/23/18 131 lb 8 oz (59.6 kg)  06/18/18 128 lb 9.6 oz (58.3 kg)     GEN:  Well nourished, well developed in  no acute distress HEENT: Normal NECK: No JVD; No carotid bruits LYMPHATICS: No lymphadenopathy CARDIAC: RRR, 2/6 SM, no rubs, gallops RESPIRATORY:  Clear to auscultation without rales, wheezing or rhonchi  ABDOMEN: Soft, non-tender, non-distended MUSCULOSKELETAL:  2+ LE edema; No deformity  SKIN: Warm and dry NEUROLOGIC:  Alert and oriented x 3 PSYCHIATRIC:  Normal affect   ASSESSMENT:    1. Chest pain, unspecified type   2. Shortness of breath   3. Aortic root dilatation (HCC)    PLAN:    In order of problems listed above:   Chest pain/angina  CT of chest IMPRESSION: 1. No significant aneurysmal disease of the thoracic aorta. The ascending thoracic aorta is top-normal in caliber measuring 3.9 cm in greatest diameter. 2. Coronary atherosclerosis with mildly calcified plaque in the distribution of the LAD and left circumflex coronary arteries. 3. Probable underlying pulmonary hypertension with dilated main pulmonary artery. 4. Evidence of probable pulmonary venous hypertension without overt pulmonary edema. 5. Visualized upper abdomen shows significant splenomegaly and potentially left-sided upper abdominal retroperitoneal lymphadenopathy. Underlying hematologic disease such as leukemia or lymphoma cannot be excluded. Recommend formal CT of the abdomen and pelvis with contrast.  She also has had an appoint with Dr. Alen Blew because of splenomegaly.  CLL diagnosed back in 2017.  Found to have splenomegaly as well.  This is not dramatically  changed. - Has had episode of chest tightness periodically.  She also has hoarseness to her voice which is new over the past 4 months.  I will check a CT of her chest to evaluate her aorta to make sure that she does not have any evidence of marked dilation causing changes to her recurrent laryngeal nerve.  Echocardiogram did show a mildly dilated aortic root of 39 mm previously. -I will also check a pharmacologic stress test to see if she has any high risk ischemia.  I mentioned to her the possibility of heart catheterization, angiogram given her advanced age, higher pretest probability for coronary artery disease especially given her aortic and iliac atherosclerosis and symptoms.  She would not like to proceed with invasive approach first but we can talk about that if necessary in the future.  Aortic atherosclerosis - Continue with prevention  CLL -Splenomegaly noted.  Coronary artery calcification -Noted on CT scan as above. - I will start Crestor 5 mg once a day.  She was very hesitant.  Did not force her to proceed.  This can continue to be monitored by Dr. Jarold Song. - Also discussed that her dyspnea may be related to underlying coronary artery disease.  They do not wish to proceed with further testing, nuclear stress test.   Overall dyspnea may certainly be multifactorial.  Continue to encourage activity.  Obviously if anginal symptoms were to occur, they may wish to pursue further cardiac testing.  For now.  Continue with preventive strategies.  At this time, please let us know we can be of further assistance.  Echocardiogram overall reassuring with normal EF.   Medication Adjustments/Labs and Tests Ordered: Current medicines are reviewed at length with the patient today.  Concerns regarding medicines are outlined above.  No orders of the defined types were placed in this encounter.  Meds ordered this encounter  Medications  . rosuvastatin (CRESTOR) 5 MG tablet    Sig: Take 1 tablet  (5 mg total) by mouth daily.    Dispense:  90 tablet    Refill:  3    Patient Instructions  Medication Instructions:  Please  start Crestor 5 mg daily. Continue all other medications as listed.  If you need a refill on your cardiac medications before your next appointment, please call your pharmacy.   Follow-Up: . Follow up as needed with Dr Marlou Porch.  Thank you for choosing [facility]!!         Signed, Candee Furbish, MD  08/04/2018 4:57 PM    [place]

## 2018-08-04 NOTE — Patient Instructions (Signed)
Medication Instructions:  Please start Crestor 5 mg daily. Continue all other medications as listed.  If you need a refill on your cardiac medications before your next appointment, please call your pharmacy.   Follow-Up: . Follow up as needed with Dr Marlou Porch.  Thank you for choosing [facility]!!

## 2018-09-29 ENCOUNTER — Telehealth: Payer: Self-pay | Admitting: *Deleted

## 2018-09-29 NOTE — Telephone Encounter (Signed)
FYI Spoke with patient and she is no longer interested in Prolia injections.

## 2018-09-29 NOTE — Telephone Encounter (Signed)
Noted. Thanks.

## 2018-10-05 ENCOUNTER — Other Ambulatory Visit: Payer: Self-pay

## 2018-10-05 ENCOUNTER — Encounter: Payer: Self-pay | Admitting: Family Medicine

## 2018-10-05 ENCOUNTER — Ambulatory Visit (INDEPENDENT_AMBULATORY_CARE_PROVIDER_SITE_OTHER): Payer: Medicare Other | Admitting: Family Medicine

## 2018-10-05 VITALS — BP 120/62 | HR 78 | Temp 97.5°F | Ht 64.5 in | Wt 127.3 lb

## 2018-10-05 DIAGNOSIS — M79605 Pain in left leg: Secondary | ICD-10-CM | POA: Diagnosis not present

## 2018-10-05 NOTE — Progress Notes (Signed)
  Subjective:     Patient ID: Briana Fitzgerald, female   DOB: 09-Sep-1932, 85 y.o.   MRN: [id number]  HPI Patient is seen with some left lateral leg pain.  She states she had skin cancer in that region back in 2018- presumably basal cell carcinoma that was excised.  She was concerned about recurrence though she sees no signs of recurrent growth.  She occasionally has some itching.  She has pain that seems to be worse at night and is localized to the lower lateral third of the leg.  No pain with ambulation.  Does not describe any neuropathy type symptoms.  Past Medical History:  Diagnosis Date  . Arthritis   . BACK PAIN 07/18/2008  . Bowel obstruction ([place])   . CONSTIPATION 10/02/2010  . GERD 10/02/2010  . Headache   . IBS (irritable bowel syndrome)   . LAMINECTOMY, LUMBAR, HX OF 06/16/2008  . OSTEOARTHRITIS 10/02/2010  . Pneumonia   . RESTLESS LEG SYNDROME 10/02/2010  . SACROILIAC JOINT DYSFUNCTION 06/16/2008  . Sleep apnea   . STYE 10/02/2010  . TRANSIENT ISCHEMIC ATTACKS, HX OF 10/02/2010   Past Surgical History:  Procedure Laterality Date  . ABDOMINAL HYSTERECTOMY  1979   TAH, fibroids  . [place] / [place]  2005  . APPENDECTOMY  1941  . EYE SURGERY  2005   catarac,both eyes  . LIVER BIOPSY  1997   benign, hemangioma resection  . SPINE SURGERY  2006   laminectomy    reports that she has never smoked. She has never used smokeless tobacco. She reports current alcohol use. She reports that she does not use drugs. family history includes Breast cancer in her paternal aunt; Diabetes in her father; Pancreatitis in her brother; Stroke in her father. No Known Allergies   Review of Systems  Constitutional: Negative for fatigue and unexpected weight change.  Eyes: Negative for visual disturbance.  Respiratory: Negative for cough, chest tightness, shortness of breath and wheezing.   Cardiovascular: Negative for chest pain, palpitations and leg swelling.  Neurological: Negative for  dizziness, seizures, syncope, weakness, light-headedness and headaches.       Objective:   Physical Exam Constitutional:      Appearance: Normal appearance.  Cardiovascular:     Rate and Rhythm: Normal rate and regular rhythm.  Pulmonary:     Effort: Pulmonary effort is normal.     Breath sounds: Normal breath sounds.  Musculoskeletal:     Comments: No bony tenderness left lower extremity.  No calf tenderness.  Skin:    Comments: She has varicosities on both lower extremities.  We did not see any signs of nodules or any signs of recurrent skin cancer on either leg.  Neurological:     Mental Status: She is alert.        Assessment:     Left lateral leg pain.  Reported past history of basal cell carcinoma in that region with no signs of recurrent growth. Pain ?related to phlebitis.    Plan:     -try some low level heat and also recommend she get back on her compression hose -follow up for any increased edema or other concerns.  Eulas Post MD [facility]

## 2018-10-05 NOTE — Patient Instructions (Signed)

## 2018-11-24 ENCOUNTER — Inpatient Hospital Stay: Payer: Medicare Other | Attending: Family Medicine

## 2018-11-24 ENCOUNTER — Inpatient Hospital Stay: Payer: Medicare Other | Admitting: Oncology

## 2018-12-29 ENCOUNTER — Other Ambulatory Visit: Payer: Self-pay | Admitting: Cardiology

## 2019-06-08 ENCOUNTER — Other Ambulatory Visit: Payer: Self-pay | Admitting: Family Medicine

## 2019-07-12 ENCOUNTER — Telehealth: Payer: Self-pay | Admitting: *Deleted

## 2019-07-12 ENCOUNTER — Telehealth: Payer: Self-pay | Admitting: Oncology

## 2019-07-12 NOTE — Telephone Encounter (Signed)
Patient called to see if she could get an appointment and lab visit.  She states that she was due back in March but due to [place] did not get scheduled.  Per last office visit due for labs and Md visit.  Request was sent to scheduling department.

## 2019-07-12 NOTE — Telephone Encounter (Signed)
Scheduled appt per 10/26 sch message - pt is aware of appt date and time   

## 2019-07-15 ENCOUNTER — Other Ambulatory Visit: Payer: Self-pay

## 2019-07-15 ENCOUNTER — Inpatient Hospital Stay: Payer: Medicare Other

## 2019-07-15 ENCOUNTER — Inpatient Hospital Stay: Payer: Medicare Other | Attending: Oncology | Admitting: Oncology

## 2019-07-15 VITALS — BP 137/64 | HR 78 | Temp 98.3°F | Resp 18 | Ht 64.5 in | Wt 132.6 lb

## 2019-07-15 DIAGNOSIS — Z79899 Other long term (current) drug therapy: Secondary | ICD-10-CM | POA: Insufficient documentation

## 2019-07-15 DIAGNOSIS — D509 Iron deficiency anemia, unspecified: Secondary | ICD-10-CM

## 2019-07-15 DIAGNOSIS — C9110 Chronic lymphocytic leukemia of B-cell type not having achieved remission: Secondary | ICD-10-CM | POA: Diagnosis not present

## 2019-07-15 DIAGNOSIS — D696 Thrombocytopenia, unspecified: Secondary | ICD-10-CM | POA: Diagnosis not present

## 2019-07-15 LAB — CBC WITH DIFFERENTIAL (CANCER CENTER ONLY)
Abs Immature Granulocytes: 0.03 10*3/uL (ref 0.00–0.07)
Basophils Absolute: 0 10*3/uL (ref 0.0–0.1)
Basophils Relative: 1 %
Eosinophils Absolute: 0.2 10*3/uL (ref 0.0–0.5)
Eosinophils Relative: 3 %
HCT: 39.9 % (ref 36.0–46.0)
Hemoglobin: 13.4 g/dL (ref 12.0–15.0)
Immature Granulocytes: 1 %
Lymphocytes Relative: 36 %
Lymphs Abs: 1.7 10*3/uL (ref 0.7–4.0)
MCH: 27.2 pg (ref 26.0–34.0)
MCHC: 33.6 g/dL (ref 30.0–36.0)
MCV: 80.9 fL (ref 80.0–100.0)
Monocytes Absolute: 0.4 10*3/uL (ref 0.1–1.0)
Monocytes Relative: 8 %
Neutro Abs: 2.5 10*3/uL (ref 1.7–7.7)
Neutrophils Relative %: 51 %
Platelet Count: 100 10*3/uL — ABNORMAL LOW (ref 150–400)
RBC: 4.93 MIL/uL (ref 3.87–5.11)
RDW: 15.1 % (ref 11.5–15.5)
WBC Count: 4.7 10*3/uL (ref 4.0–10.5)
nRBC: 0 % (ref 0.0–0.2)

## 2019-07-15 LAB — IRON AND TIBC
Iron: 50 ug/dL (ref 41–142)
Saturation Ratios: 18 % — ABNORMAL LOW (ref 21–57)
TIBC: 280 ug/dL (ref 236–444)
UIBC: 230 ug/dL (ref 120–384)

## 2019-07-15 LAB — FERRITIN: Ferritin: 65 ng/mL (ref 11–307)

## 2019-07-15 NOTE — Progress Notes (Signed)
Hematology and Oncology Follow Up Visit  Briana Fitzgerald:[id number] [birth year] 85 y.o. 07/15/2019 9:44 AM [name], Briana Fitzgerald, MDBurchette, Briana Sierras, MD   Principle Diagnosis: 85 year old woman with CLL presented with lymphocytosis and lymphadenopathy diagnosed in July 2017.  He has not required treatment since that time.  Current therapy: Active surveillance.  Interim History: Briana Fitzgerald here for a follow-up visit.  Since her last visit, she reports no major changes in her health.  She does report some periodic fatigue tiredness but still able to attend activities of daily living.  Her weight has not really changed dramatically her appetite about the same.  She has difficulty sleeping and getting rest regularly because of restless leg syndrome.  She denies any abdominal pain, fevers or early satiety.  He denies any decline in her quality of life.   She denied any alteration mental status, neuropathy, confusion or dizziness.  Denies any headaches or lethargy.  Denies any night sweats, weight loss or changes in appetite.  Denied orthopnea, dyspnea on exertion or chest discomfort.  Denies shortness of breath, difficulty breathing hemoptysis or cough.  Denies any abdominal distention, nausea, early satiety or dyspepsia.  Denies any hematuria, frequency, dysuria or nocturia.  Denies any skin irritation, dryness or rash.  Denies any ecchymosis or petechiae.  Denies any lymphadenopathy or clotting.  Denies any heat or cold intolerance.  Denies any anxiety or depression.  Remaining review of system is negative.      Medications: Unchanged on review. Current Outpatient Medications  Medication Sig Dispense Refill  . acetaminophen (TYLENOL) 500 MG tablet Take 500 mg by mouth every 6 (six) hours as needed.    . butalbital-aspirin-caffeine (FIORINAL) 50-325-40 MG capsule Take 1 capsule by mouth every 6 (six) hours as needed for headache. 30 capsule 0  . Calcium Carbonate-Vitamin D (CALTRATE 600+D)  600-400 MG-UNIT tablet Take 1 tablet by mouth daily.    . furosemide (LASIX) 40 MG tablet TAKE 1 TABLET BY MOUTH EVERY DAY 90 tablet 2  . Misc Natural Products (TART CHERRY ADVANCED PO) Take 1,200 mg by mouth daily.    . Omega-3 Fatty Acids (SALMON OIL-1000 PO) Take 1 tablet by mouth daily.    . pramipexole (MIRAPEX) 0.5 MG tablet TAKE 1 TABLET (0.5 MG TOTAL) BY MOUTH AT BEDTIME. 90 tablet 3  . pyridoxine (B-6) 100 MG tablet Take 100 mg by mouth daily.    . rosuvastatin (CRESTOR) 5 MG tablet Take 1 tablet (5 mg total) by mouth daily. 90 tablet 3  . vitamin B-12 (CYANOCOBALAMIN) 500 MCG tablet Take 500 mcg by mouth daily.    . vitamin C (ASCORBIC ACID) 500 MG tablet Take 500 mg by mouth daily.    . vitamin E 400 UNIT capsule Take 400 Units by mouth daily.     No current facility-administered medications for this visit.      Allergies: No Known Allergies  Past Medical History, Surgical history, Social history, and Family History    Physical Exam: Blood pressure 137/64, pulse 78, temperature 98.3 F (36.8 C), temperature source Oral, resp. rate 18, height 5' 4.5" (1.638 m), weight 132 lb 9.6 oz (60.1 kg), SpO2 99 %.   ECOG: 1   General appearance: Comfortable appearing without any discomfort Head: Normocephalic without any trauma Oropharynx: Mucous membranes are moist and pink without any thrush or ulcers. Eyes: Pupils are equal and round reactive to light. Lymph nodes: No cervical, supraclavicular, inguinal or axillary lymphadenopathy.   Heart:regular rate and rhythm.  S1  and S2 without leg edema. Lung: Clear without any rhonchi or wheezes.  No dullness to percussion. Abdomin: Soft, nontender, nondistended with good bowel sounds.  No hepatosplenomegaly. Musculoskeletal: No joint deformity or effusion.  Full range of motion noted. Neurological: No deficits noted on motor, sensory and deep tendon reflex exam. Skin: No petechial rash or dryness.  Appeared moist.  Psychiatric: Mood  and affect appeared appropriate.     Lab Results: Lab Results  Component Value Date   WBC 4.7 07/15/2019   HGB 13.4 07/15/2019   HCT 39.9 07/15/2019   MCV 80.9 07/15/2019   PLT 100 (L) 07/15/2019     Chemistry      Component Value Date/Time   NA 140 07/24/2018 1053   NA 140 06/18/2018 1655   NA 138 10/15/2016 1132   K 4.1 07/24/2018 1053   K 4.0 10/15/2016 1132   CL 106 07/24/2018 1053   CO2 28 07/24/2018 1053   CO2 25 10/15/2016 1132   BUN 13 07/24/2018 1053   BUN 19 06/18/2018 1655   BUN 14.1 10/15/2016 1132   CREATININE 0.84 07/24/2018 1053   CREATININE 0.9 10/15/2016 1132      Component Value Date/Time   CALCIUM 9.9 07/24/2018 1053   CALCIUM 10.5 (H) 10/15/2016 1132   ALKPHOS 77 07/24/2018 1053   ALKPHOS 88 10/15/2016 1132   AST 32 07/24/2018 1053   AST 28 10/15/2016 1132   ALT 18 07/24/2018 1053   ALT 22 10/15/2016 1132   BILITOT 1.3 (H) 07/24/2018 1053   BILITOT 1.20 10/15/2016 1132       Impression and Plan:  85 year old woman with:  1.  CLL presented with lymphocytosis and adenopathy in 2017.  She has not required treatment at this time.    CBC from today showed a normal white cell count and hemoglobin and mild thrombocytopenia.  The natural course of this disease was reviewed today and indication for treatment were reiterated.  She is not experiencing any constitutional symptoms of bulky adenopathy that requires intervention.  Treatment options such as ibrutinib among other agents were also reviewed today.  These will be deferred unless he develops symptomatic progression.  Other indication for treatment she develops bone marrow suppression, painful adenopathy as well as constitutional symptoms were reiterated.  2.Thrombocytopenia: Likely related to an autoimmune etiology related to CLL.  Her platelet count remains close to 100 and do not require any intervention.  3.  Iron deficiency anemia: Hemoglobin remains normal at this time.  Will supplement as  needed.    4. Follow-up: She will return in 6 months for repeat evaluation.   25  minutes was spent with the patient face-to-face today.  More than 50% of time was dedicated to reviewing disease status, laboratory data and coordinating plan of care.    Zola Button, MD 10/29/20209:44 AM

## 2019-07-16 ENCOUNTER — Telehealth: Payer: Self-pay | Admitting: Oncology

## 2019-07-16 NOTE — Telephone Encounter (Signed)
Scheduled appt per 10/29 los.  Was not able to get in touch with the pt. Sent a staff message to get a calendar mailed out.

## 2019-07-28 ENCOUNTER — Other Ambulatory Visit: Payer: Self-pay

## 2019-07-28 ENCOUNTER — Encounter: Payer: Self-pay | Admitting: Internal Medicine

## 2019-07-28 ENCOUNTER — Telehealth: Payer: Self-pay

## 2019-07-28 ENCOUNTER — Ambulatory Visit (INDEPENDENT_AMBULATORY_CARE_PROVIDER_SITE_OTHER): Payer: Medicare Other | Admitting: Internal Medicine

## 2019-07-28 VITALS — BP 118/78 | HR 79 | Temp 98.2°F | Wt 131.5 lb

## 2019-07-28 DIAGNOSIS — H1031 Unspecified acute conjunctivitis, right eye: Secondary | ICD-10-CM | POA: Diagnosis not present

## 2019-07-28 MED ORDER — ERYTHROMYCIN 5 MG/GM OP OINT: TOPICAL_OINTMENT | OPHTHALMIC | 0 refills | Status: DC

## 2019-07-28 NOTE — Progress Notes (Signed)
Acute Office Visit     CC/Reason for Visit: Right eye pain and redness  HPI: Briana Fitzgerald is a 85 y.o. female who is coming in today for the above mentioned reasons. Started 3 days ago. Has a yellow/brown discharge. In the mornings when she wakes up her eyes are closed shut. She denies fever or vision changes. She tried visine for red eyes without relief.  Past Medical/Surgical History: Past Medical History:  Diagnosis Date  . Arthritis   . BACK PAIN 07/18/2008  . Bowel obstruction ([place])   . CONSTIPATION 10/02/2010  . GERD 10/02/2010  . Headache   . IBS (irritable bowel syndrome)   . LAMINECTOMY, LUMBAR, HX OF 06/16/2008  . OSTEOARTHRITIS 10/02/2010  . Pneumonia   . RESTLESS LEG SYNDROME 10/02/2010  . SACROILIAC JOINT DYSFUNCTION 06/16/2008  . Sleep apnea   . STYE 10/02/2010  . TRANSIENT ISCHEMIC ATTACKS, HX OF 10/02/2010    Past Surgical History:  Procedure Laterality Date  . ABDOMINAL HYSTERECTOMY  1979   TAH, fibroids  . [place] / [place]  2005  . APPENDECTOMY  1941  . EYE SURGERY  2005   catarac,both eyes  . LIVER BIOPSY  1997   benign, hemangioma resection  . SPINE SURGERY  2006   laminectomy    Social History:  reports that she has never smoked. She has never used smokeless tobacco. She reports current alcohol use. She reports that she does not use drugs.  Allergies: No Known Allergies  Family History:  Family History  Problem Relation Age of Onset  . Stroke Father   . Diabetes Father        type ll  . Pancreatitis Brother   . Breast cancer Paternal Aunt        aunt  . Colon cancer Neg Hx   . Stomach cancer Neg Hx   . Rectal cancer Neg Hx   . Liver cancer Neg Hx   . Esophageal cancer Neg Hx      Current Outpatient Medications:  .  acetaminophen (TYLENOL) 500 MG tablet, Take 500 mg by mouth every 6 (six) hours as needed., Disp: , Rfl:  .  butalbital-aspirin-caffeine (FIORINAL) 50-325-40 MG capsule, Take 1 capsule by mouth every 6  (six) hours as needed for headache., Disp: 30 capsule, Rfl: 0 .  Calcium Carbonate-Vitamin D (CALTRATE 600+D) 600-400 MG-UNIT tablet, Take 1 tablet by mouth daily., Disp: , Rfl:  .  furosemide (LASIX) 40 MG tablet, TAKE 1 TABLET BY MOUTH EVERY DAY, Disp: 90 tablet, Rfl: 2 .  Misc Natural Products (TART CHERRY ADVANCED PO), Take 1,200 mg by mouth daily., Disp: , Rfl:  .  Omega-3 Fatty Acids (SALMON OIL-1000 PO), Take 1 tablet by mouth daily., Disp: , Rfl:  .  pramipexole (MIRAPEX) 0.5 MG tablet, TAKE 1 TABLET (0.5 MG TOTAL) BY MOUTH AT BEDTIME., Disp: 90 tablet, Rfl: 3 .  pyridoxine (B-6) 100 MG tablet, Take 100 mg by mouth daily., Disp: , Rfl:  .  vitamin B-12 (CYANOCOBALAMIN) 500 MCG tablet, Take 500 mcg by mouth daily., Disp: , Rfl:  .  vitamin C (ASCORBIC ACID) 500 MG tablet, Take 500 mg by mouth daily., Disp: , Rfl:  .  vitamin E 400 UNIT capsule, Take 400 Units by mouth daily., Disp: , Rfl:  .  erythromycin ophthalmic ointment, 1 ribbon 3 times a day to the right eye for 10 days, Disp: 3.5 g, Rfl: 0 .  rosuvastatin (CRESTOR) 5 MG tablet, Take 1  tablet (5 mg total) by mouth daily., Disp: 90 tablet, Rfl: 3  Review of Systems:  Constitutional: Denies fever, chills, diaphoresis, appetite change and fatigue.  HEENT: Denies hearing loss, ear pain, congestion, sore throat, rhinorrhea, sneezing, mouth sores, trouble swallowing, neck pain, neck stiffness and tinnitus.   Respiratory: Denies SOB, DOE, cough, chest tightness,  and wheezing.   Cardiovascular: Denies chest pain, palpitations and leg swelling.  Gastrointestinal: Denies nausea, vomiting, abdominal pain, diarrhea, constipation, blood in stool and abdominal distention.  Genitourinary: Denies dysuria, urgency, frequency, hematuria, flank pain and difficulty urinating.  Endocrine: Denies: hot or cold intolerance, sweats, changes in hair or nails, polyuria, polydipsia. Musculoskeletal: Denies myalgias, back pain, joint swelling, arthralgias and  gait problem.  Skin: Denies pallor, rash and wound.  Neurological: Denies dizziness, seizures, syncope, weakness, light-headedness, numbness and headaches.  Hematological: Denies adenopathy. Easy bruising, personal or family bleeding history  Psychiatric/Behavioral: Denies suicidal ideation, mood changes, confusion, nervousness, sleep disturbance and agitation    Physical Exam: Vitals:   07/28/19 1402  BP: 118/78  Pulse: 79  Temp: 98.2 F (36.8 C)  TempSrc: Temporal  SpO2: 95%  Weight: 131 lb 8 oz (59.6 kg)    Body mass index is 22.22 kg/m.   Constitutional: NAD, calm, comfortable Eyes: PERRL, edema and erythema of right upper eyelid, conjunctival injection and moderate amount of purulent drainage is noted. ENMT: Mucous membranes are moist.  Impression and Plan:  Acute bacterial conjunctivitis of right eye  -Erythromycin ointment TID for 10 days, lubricating eye drops. -RTC in 10-14 days if no improvement.    Patient Instructions  -Nice seeing you today!!  -Apply erythromycin ointment to your right eye 3 times a day for 10 days.  -May use lubricating eye drops as needed.   Bacterial Conjunctivitis, Adult Bacterial conjunctivitis is an infection of the clear membrane that covers the white part of your eye and the inner surface of your eyelid (conjunctiva). When the blood vessels in your conjunctiva become inflamed, your eye becomes red or pink, and it will probably feel itchy. Bacterial conjunctivitis spreads very easily from person to person (is contagious). It also spreads easily from one eye to the other eye. What are the causes? This condition is caused by bacteria. You may get the infection if you come into close contact with:  A person who is infected with the bacteria.  Items that are contaminated with the bacteria, such as a face towel, contact lens solution, or eye makeup. What increases the risk? You are more likely to develop this condition if you:   Are exposed to other people who have the infection.  Wear contact lenses.  Have a sinus infection.  Have had a recent eye injury or surgery.  Have a weak body defense system (immune system).  Have a medical condition that causes dry eyes. What are the signs or symptoms? Symptoms of this condition include:  Thick, yellowish discharge from the eye. This may turn into a crust on the eyelid overnight and cause your eyelids to stick together.  Tearing or watery eyes.  Itchy eyes.  Burning feeling in your eyes.  Eye redness.  Swollen eyelids.  Blurred vision. How is this diagnosed? This condition is diagnosed based on your symptoms and medical history. Your health care provider may also take a sample of discharge from your eye to find the cause of your infection. This is rarely done. How is this treated? This condition may be treated with:  Antibiotic eye drops or ointment  to clear the infection more quickly and prevent the spread of infection to others.  Oral antibiotic medicines to treat infections that do not respond to drops or ointments or that last longer than 10 days.  Cool, wet cloths (cool compresses) placed on the eyes.  Artificial tears applied 2-6 times a day. Follow these instructions at home: Medicines  Take or apply your antibiotic medicine as told by your health care provider. Do not stop taking or applying the antibiotic even if you start to feel better.  Take or apply over-the-counter and prescription medicines only as told by your health care provider.  Be very careful to avoid touching the edge of your eyelid with the eye-drop bottle or the ointment tube when you apply medicines to the affected eye. This will keep you from spreading the infection to your other eye or to other people. Managing discomfort  Gently wipe away any drainage from your eye with a warm, wet washcloth or a cotton ball.  Apply a clean, cool compress to your eye for 10-20 minutes,  3-4 times a day. General instructions  Do not wear contact lenses until the inflammation is gone and your health care provider says it is safe to wear them again. Ask your health care provider how to sterilize or replace your contact lenses before you use them again. Wear glasses until you can resume wearing contact lenses.  Avoid wearing eye makeup until the inflammation is gone. Throw away any old eye cosmetics that may be contaminated.  Change or wash your pillowcase every day.  Do not share towels or washcloths. This may spread the infection.  Wash your hands often with soap and water. Use paper towels to dry your hands.  Avoid touching or rubbing your eyes.  Do not drive or use heavy machinery if your vision is blurred. Contact a health care provider if:  You have a fever.  Your symptoms do not get better after 10 days. Get help right away if you have:  A fever and your symptoms suddenly get worse.  Severe pain when you move your eye.  Facial pain, redness, or swelling.  Sudden loss of vision. Summary  Bacterial conjunctivitis is an infection of the clear membrane that covers the white part of your eye and the inner surface of your eyelid (conjunctiva).  Bacterial conjunctivitis spreads very easily from person to person (is contagious).  Wash your hands often with soap and water. Use paper towels to dry your hands.  Take or apply your antibiotic medicine as told by your health care provider. Do not stop taking or applying the antibiotic even if you start to feel better.  Contact a health care provider if you have a fever or your symptoms do not get better after 10 days. This information is not intended to replace advice given to you by your health care provider. Make sure you discuss any questions you have with your health care provider. Document Released: 09/02/2005 Document Revised: 12/22/2018 Document Reviewed: 04/08/2018 Elsevier Patient Education  2020 [place], [place] [facility]

## 2019-07-28 NOTE — Telephone Encounter (Signed)
Pt called with c/o possible eye infection. She reports her eye is "oozing" and seems to be coming from under her lower eyelid. She states it is matted in the mornings. She reports some burning but denies pain/itching. She is concerned about her eyesight. Dr. Erick Blinks schedule is full and pt cannot complete a virtual visit. She is wanting someone to look at her eye.   Apolonio Schneiders - would Jerilee Hoh be able to see pt this afternoon? Please advise. Thank you!

## 2019-07-28 NOTE — Telephone Encounter (Signed)
In office visit scheduled.

## 2019-07-28 NOTE — Telephone Encounter (Signed)
Yes

## 2019-07-28 NOTE — Patient Instructions (Signed)
-Nice seeing you today!!  -Apply erythromycin ointment to your right eye 3 times a day for 10 days.  -May use lubricating eye drops as needed.   Bacterial Conjunctivitis, Adult Bacterial conjunctivitis is an infection of the clear membrane that covers the white part of your eye and the inner surface of your eyelid (conjunctiva). When the blood vessels in your conjunctiva become inflamed, your eye becomes red or pink, and it will probably feel itchy. Bacterial conjunctivitis spreads very easily from person to person (is contagious). It also spreads easily from one eye to the other eye. What are the causes? This condition is caused by bacteria. You may get the infection if you come into close contact with:  A person who is infected with the bacteria.  Items that are contaminated with the bacteria, such as a face towel, contact lens solution, or eye makeup. What increases the risk? You are more likely to develop this condition if you:  Are exposed to other people who have the infection.  Wear contact lenses.  Have a sinus infection.  Have had a recent eye injury or surgery.  Have a weak body defense system (immune system).  Have a medical condition that causes dry eyes. What are the signs or symptoms? Symptoms of this condition include:  Thick, yellowish discharge from the eye. This may turn into a crust on the eyelid overnight and cause your eyelids to stick together.  Tearing or watery eyes.  Itchy eyes.  Burning feeling in your eyes.  Eye redness.  Swollen eyelids.  Blurred vision. How is this diagnosed? This condition is diagnosed based on your symptoms and medical history. Your health care provider may also take a sample of discharge from your eye to find the cause of your infection. This is rarely done. How is this treated? This condition may be treated with:  Antibiotic eye drops or ointment to clear the infection more quickly and prevent the spread of infection  to others.  Oral antibiotic medicines to treat infections that do not respond to drops or ointments or that last longer than 10 days.  Cool, wet cloths (cool compresses) placed on the eyes.  Artificial tears applied 2-6 times a day. Follow these instructions at home: Medicines  Take or apply your antibiotic medicine as told by your health care provider. Do not stop taking or applying the antibiotic even if you start to feel better.  Take or apply over-the-counter and prescription medicines only as told by your health care provider.  Be very careful to avoid touching the edge of your eyelid with the eye-drop bottle or the ointment tube when you apply medicines to the affected eye. This will keep you from spreading the infection to your other eye or to other people. Managing discomfort  Gently wipe away any drainage from your eye with a warm, wet washcloth or a cotton ball.  Apply a clean, cool compress to your eye for 10-20 minutes, 3-4 times a day. General instructions  Do not wear contact lenses until the inflammation is gone and your health care provider says it is safe to wear them again. Ask your health care provider how to sterilize or replace your contact lenses before you use them again. Wear glasses until you can resume wearing contact lenses.  Avoid wearing eye makeup until the inflammation is gone. Throw away any old eye cosmetics that may be contaminated.  Change or wash your pillowcase every day.  Do not share towels or washcloths. This may  spread the infection.  Wash your hands often with soap and water. Use paper towels to dry your hands.  Avoid touching or rubbing your eyes.  Do not drive or use heavy machinery if your vision is blurred. Contact a health care provider if:  You have a fever.  Your symptoms do not get better after 10 days. Get help right away if you have:  A fever and your symptoms suddenly get worse.  Severe pain when you move your eye.   Facial pain, redness, or swelling.  Sudden loss of vision. Summary  Bacterial conjunctivitis is an infection of the clear membrane that covers the white part of your eye and the inner surface of your eyelid (conjunctiva).  Bacterial conjunctivitis spreads very easily from person to person (is contagious).  Wash your hands often with soap and water. Use paper towels to dry your hands.  Take or apply your antibiotic medicine as told by your health care provider. Do not stop taking or applying the antibiotic even if you start to feel better.  Contact a health care provider if you have a fever or your symptoms do not get better after 10 days. This information is not intended to replace advice given to you by your health care provider. Make sure you discuss any questions you have with your health care provider. Document Released: 09/02/2005 Document Revised: 12/22/2018 Document Reviewed: 04/08/2018 Elsevier Patient Education  2020 [employer].

## 2019-08-02 ENCOUNTER — Other Ambulatory Visit: Payer: Self-pay | Admitting: Cardiology

## 2019-08-05 ENCOUNTER — Other Ambulatory Visit: Payer: Self-pay | Admitting: Cardiology

## 2019-08-20 ENCOUNTER — Encounter: Payer: Self-pay | Admitting: Family Medicine

## 2019-08-20 ENCOUNTER — Other Ambulatory Visit: Payer: Self-pay

## 2019-08-20 ENCOUNTER — Ambulatory Visit: Payer: Self-pay

## 2019-08-20 ENCOUNTER — Ambulatory Visit (INDEPENDENT_AMBULATORY_CARE_PROVIDER_SITE_OTHER): Payer: Medicare Other | Admitting: Family Medicine

## 2019-08-20 VITALS — BP 100/80 | HR 77 | Ht 64.5 in | Wt 130.0 lb

## 2019-08-20 DIAGNOSIS — M25552 Pain in left hip: Secondary | ICD-10-CM

## 2019-08-20 DIAGNOSIS — M25551 Pain in right hip: Secondary | ICD-10-CM

## 2019-08-20 DIAGNOSIS — M542 Cervicalgia: Secondary | ICD-10-CM

## 2019-08-20 DIAGNOSIS — M5030 Other cervical disc degeneration, unspecified cervical region: Secondary | ICD-10-CM | POA: Diagnosis not present

## 2019-08-20 NOTE — Progress Notes (Signed)
[facility] [place], [place] [address] Phone: [phone] Subjective:   I Kandace Blitz am serving as a Education administrator for Dr. Hulan Saas.  I'm seeing this patient by the request  of:    CC:   [id number]  Lanessa H Hedges is a 85 y.o. female coming in with complaint of neck as well as bilateral hip pain. Patient states that she fell on her right shoulder. Neck is painful on the right side and it clicks. Patient states she is dizzy and her balance is off. Left hip is worse than right. Not a candidate for replacement due to enlarged spleen. Patient states she has not been able to exercise. Walks about 15- 20 mins.   Onset- Neck; chronic  Location - right sided neckpain bilateral hip pain Duration-  Character- Aggravating factors- walking (balance)  Reliving factors-  Therapies tried-  Severity-8 out of 10     Past Medical History:  Diagnosis Date  . Arthritis   . BACK PAIN 07/18/2008  . Bowel obstruction ([place])   . CONSTIPATION 10/02/2010  . GERD 10/02/2010  . Headache   . IBS (irritable bowel syndrome)   . LAMINECTOMY, LUMBAR, HX OF 06/16/2008  . OSTEOARTHRITIS 10/02/2010  . Pneumonia   . RESTLESS LEG SYNDROME 10/02/2010  . SACROILIAC JOINT DYSFUNCTION 06/16/2008  . Sleep apnea   . STYE 10/02/2010  . TRANSIENT ISCHEMIC ATTACKS, HX OF 10/02/2010   Past Surgical History:  Procedure Laterality Date  . ABDOMINAL HYSTERECTOMY  1979   TAH, fibroids  . APOGEE / [place]  2005  . APPENDECTOMY  1941  . EYE SURGERY  2005   catarac,both eyes  . LIVER BIOPSY  1997   benign, hemangioma resection  . SPINE SURGERY  2006   laminectomy   Social History   Socioeconomic History  . Marital status: Married    Spouse name: Not on file  . Number of children: 2  . Years of education: Not on file  . Highest education level: Not on file  Occupational History  . Occupation: retired  Scientific laboratory technician  . Financial resource strain: Not on file   . Food insecurity    Worry: Not on file    Inability: Not on file  . Transportation needs    Medical: Not on file    Non-medical: Not on file  Tobacco Use  . Smoking status: Never Smoker  . Smokeless tobacco: Never Used  Substance and Sexual Activity  . Alcohol use: Yes    Comment: at dinner with friends, 2-3 per month - wine  . Drug use: No  . Sexual activity: Not on file  Lifestyle  . Physical activity    Days per week: Not on file    Minutes per session: Not on file  . Stress: Not on file  Relationships  . Social Herbalist on phone: Not on file    Gets together: Not on file    Attends religious service: Not on file    Active member of club or organization: Not on file    Attends meetings of clubs or organizations: Not on file    Relationship status: Not on file  Other Topics Concern  . Not on file  Social History Narrative  . Not on file   No Known Allergies Family History  Problem Relation Age of Onset  . Stroke Father   . Diabetes Father        type ll  .  Pancreatitis Brother   . Breast cancer Paternal Aunt        aunt  . Colon cancer Neg Hx   . Stomach cancer Neg Hx   . Rectal cancer Neg Hx   . Liver cancer Neg Hx   . Esophageal cancer Neg Hx      Current Outpatient Medications (Cardiovascular):  .  furosemide (LASIX) 40 MG tablet, TAKE 1 TABLET BY MOUTH EVERY DAY .  rosuvastatin (CRESTOR) 5 MG tablet, TAKE 1 TABLET BY MOUTH EVERY DAY   Current Outpatient Medications (Analgesics):  .  acetaminophen (TYLENOL) 500 MG tablet, Take 500 mg by mouth every 6 (six) hours as needed. .  butalbital-aspirin-caffeine (FIORINAL) 50-325-40 MG capsule, Take 1 capsule by mouth every 6 (six) hours as needed for headache.  Current Outpatient Medications (Hematological):  .  vitamin B-12 (CYANOCOBALAMIN) 500 MCG tablet, Take 500 mcg by mouth daily.  Current Outpatient Medications (Other):  Marland Kitchen  Calcium Carbonate-Vitamin D (CALTRATE 600+D) 600-400 MG-UNIT  tablet, Take 1 tablet by mouth daily. Marland Kitchen  erythromycin ophthalmic ointment, 1 ribbon 3 times a day to the right eye for 10 days .  Misc Natural Products (TART CHERRY ADVANCED PO), Take 1,200 mg by mouth daily. .  Omega-3 Fatty Acids (SALMON OIL-1000 PO), Take 1 tablet by mouth daily. .  pramipexole (MIRAPEX) 0.5 MG tablet, TAKE 1 TABLET (0.5 MG TOTAL) BY MOUTH AT BEDTIME. Marland Kitchen  pyridoxine (B-6) 100 MG tablet, Take 100 mg by mouth daily. .  vitamin C (ASCORBIC ACID) 500 MG tablet, Take 500 mg by mouth daily. .  vitamin E 400 UNIT capsule, Take 400 Units by mouth daily.    Past medical history, social, surgical and family history all reviewed in electronic medical record.  No pertanent information unless stated regarding to the chief complaint.   Review of Systems:  No headache, visual changes, nausea, vomiting, diarrhea, constipation, dizziness, abdominal pain, skin rash, fevers, chills, night sweats, weight loss, swollen lymph nodes, body aches, joint swelling,  chest pain, shortness of breath, mood changes.  Positive muscle aches  Objective  Blood pressure 100/80, pulse 77, height 5' 4.5" (1.638 m), weight 130 lb (59 kg), SpO2 98 %.    General: No apparent distress alert and oriented x3 mood and affect normal, dressed appropriately.  HEENT: Pupils equal, extraocular movements intact  Respiratory: Patient's speak in full sentences and does not appear short of breath  Cardiovascular: Trace lower extremity edema, non tender, no erythema  Skin: Warm dry intact with no signs of infection or rash on extremities or on axial skeleton.  Abdomen: Soft nontender  Neuro: Cranial nerves II through XII are intact, neurovascularly intact in all extremities with 2+ DTRs and 2+ pulses.  Lymph: No lymphadenopathy of posterior or anterior cervical chain or axillae bilaterally.  Gait severely antalgic MSK: Severe arthritic changes.  Significant tenderness noted of the lateral aspect of the hips bilaterally.  Severe loss of lordosis with patient having severe degenerative scoliosis of the lumbar spine.  Moderate to severe tenderness over the greater trochanteric areas bilaterally.  Negative straight leg test bilaterally.  Moderate to severe tenderness in the paraspinal musculature lumbar spine.  More pain over the greater trochanteric area  Neck exam also has arthritic changes.  Near full range of motion.  Tightness noted on the left side of the neck.  Negative Spurling's.  Mild limited side bending and rotation.   Procedure: Real-time Ultrasound Guided Injection of right greater trochanteric bursitis secondary to patient's body habitus Device:  GE Logiq Q7 Ultrasound guided injection is preferred based studies that show increased duration, increased effect, greater accuracy, decreased procedural pain, increased response rate, and decreased cost with ultrasound guided versus blind injection.  Verbal informed consent obtained.  Time-out conducted.  Noted no overlying erythema, induration, or other signs of local infection.  Skin prepped in a sterile fashion.  Local anesthesia: Topical Ethyl chloride.  With sterile technique and under real time ultrasound guidance:  Greater trochanteric area was visualized and patient's bursa was noted. A 22-gauge 3 inch needle was inserted and 4 cc of 0.5% Marcaine and 1 cc of Kenalog 40 mg/dL was injected. Pictures taken Completed without difficulty  Pain immediately resolved suggesting accurate placement of the medication.  Advised to call if fevers/chills, erythema, induration, drainage, or persistent bleeding.  Images permanently stored and available for review in the ultrasound unit.  Impression: Technically successful ultrasound guided injection.   Procedure: Real-time Ultrasound Guided Injection of left  greater trochanteric bursitis secondary to patient's body habitus Device: GE Logiq Q7  Ultrasound guided injection is preferred based studies that show  increased duration, increased effect, greater accuracy, decreased procedural pain, increased response rate, and decreased cost with ultrasound guided versus blind injection.  Verbal informed consent obtained.  Time-out conducted.  Noted no overlying erythema, induration, or other signs of local infection.  Skin prepped in a sterile fashion.  Local anesthesia: Topical Ethyl chloride.  With sterile technique and under real time ultrasound guidance:  Greater trochanteric area was visualized and patient's bursa was noted. A 22-gauge 3 inch needle was inserted and 4 cc of 0.5% Marcaine and 1 cc of Kenalog 40 mg/dL was injected. Pictures taken Completed without difficulty  Pain immediately resolved suggesting accurate placement of the medication.  Advised to call if fevers/chills, erythema, induration, drainage, or persistent bleeding.  Images permanently stored and available for review in the ultrasound unit.  Impression: Technically successful ultrasound guided injection.    Impression and Recommendations:     This case required medical decision making of moderate complexity. The above documentation has been reviewed and is accurate and complete Lyndal Pulley, DO       Note: This dictation was prepared with Dragon dictation along with smaller phrase technology. Any transcriptional errors that result from this process are unintentional.

## 2019-08-20 NOTE — Assessment & Plan Note (Signed)
Degenerative disc disease.  Discussed which activities to do which wants to avoid.  Patient is to increase activity as tolerated.  Increase activity slowly.  Follow-up again in 4 to 8 weeks x-rays pending very low-dose gabapentin was considered but declined secondary to patient having difficulty with medications

## 2019-08-20 NOTE — Patient Instructions (Signed)
Home Health will be calling you Injected hips today See me again in 5-6 weeks   [address] 1st floor [place], [place] [address] Phone [phone]

## 2019-08-22 ENCOUNTER — Telehealth: Payer: Self-pay | Admitting: *Deleted

## 2019-08-22 NOTE — Telephone Encounter (Signed)
Patient called after hours line on 08/21/2019. Patient reports she needs an appointment. Patient reports t she has had swollen tonsils and she has a sore throat. She is still able to drink. She doesn't have a fever currently. She is having nasal congestion and she is also coughing. Please call patient and schedule a virtual appointment.

## 2019-08-23 ENCOUNTER — Telehealth (INDEPENDENT_AMBULATORY_CARE_PROVIDER_SITE_OTHER): Payer: Medicare Other | Admitting: Family Medicine

## 2019-08-23 ENCOUNTER — Other Ambulatory Visit: Payer: Self-pay

## 2019-08-23 DIAGNOSIS — J029 Acute pharyngitis, unspecified: Secondary | ICD-10-CM

## 2019-08-23 NOTE — Telephone Encounter (Signed)
Copied from [place] [phone]. Topic: General - Other >> Aug 23, 2019  7:56 AM Carolyn Stare wrote:  Pt req an appt today or swollen tonsils

## 2019-08-23 NOTE — Telephone Encounter (Signed)
Called patient and scheduled her for a virtual visit today at 4:30pm. Please see message. Sending as Juluis Rainier

## 2019-08-23 NOTE — Telephone Encounter (Signed)
Appointment already made. Nothing further needed.

## 2019-08-23 NOTE — Progress Notes (Signed)
This visit type was conducted due to national recommendations for restrictions regarding the COVID-19 pandemic in an effort to limit this patient's exposure and mitigate transmission in our community.   Virtual Visit via Telephone Note  I connected with Briana Fitzgerald on 08/23/19 at  4:30 PM EST by telephone and verified that I am speaking with the correct person using two identifiers.   I discussed the limitations, risks, security and privacy concerns of performing an evaluation and management service by telephone and the availability of in person appointments. I also discussed with the patient that there may be a patient responsible charge related to this service. The patient expressed understanding and agreed to proceed.  Location patient: home Location provider: work or home office Participants present for the call: patient, provider Patient did not have a visit in the prior 7 days to address this/these issue(s).   History of Present Illness: Patient relates onset around Friday of last week of sore throat, mild cough and mild nasal congestion.  She lives with her husband who apparently is asymptomatic.  They have been very isolated with exception of being around her children this past Sunday.  She already had symptoms at that time.  She denies any fevers or chills.  No myalgias.  No nausea, vomiting, or diarrhea.  No loss of taste or smell.  She states that she generally gets "tonsillitis "about once a year.  She is taken Tylenol and ibuprofen with some relief.  No significant postnasal drainage.  No dyspnea.  Past Medical History:  Diagnosis Date  . Arthritis   . BACK PAIN 07/18/2008  . Bowel obstruction ([place])   . CONSTIPATION 10/02/2010  . GERD 10/02/2010  . Headache   . IBS (irritable bowel syndrome)   . LAMINECTOMY, LUMBAR, HX OF 06/16/2008  . OSTEOARTHRITIS 10/02/2010  . Pneumonia   . RESTLESS LEG SYNDROME 10/02/2010  . SACROILIAC JOINT DYSFUNCTION 06/16/2008  . Sleep apnea   .  STYE 10/02/2010  . TRANSIENT ISCHEMIC ATTACKS, HX OF 10/02/2010   Past Surgical History:  Procedure Laterality Date  . ABDOMINAL HYSTERECTOMY  1979   TAH, fibroids  . APOG[place] / [place]  2005  . APPENDECTOMY  1941  . EYE SURGERY  2005   catarac,both eyes  . LIVER BIOPSY  1997   benign, hemangioma resection  . SPINE SURGERY  2006   laminectomy    reports that she has never smoked. She has never used smokeless tobacco. She reports current alcohol use. She reports that she does not use drugs. family history includes Breast cancer in her paternal aunt; Diabetes in her father; Pancreatitis in her brother; Stroke in her father. No Known Allergies    Observations/Objective: Patient sounds cheerful and well on the phone. I do not appreciate any SOB. Speech and thought processing are grossly intact. Patient reported vitals:  Assessment and Plan: Sore throat is her predominant symptom.  We discussed issues regarding Covid testing.  Patient declines at this time.  She has been fairly isolated with exception of some of her children.  We explained that most sore throats are viral especially in her age range.  We recommend the following  -Continue symptomatic treatment with Tylenol, salt water gargles, hot soups -We offered Covid testing and will put in orders if she changes her mind -Follow-up promptly for any fever, increased shortness of breath, or other concerns   Follow Up Instructions:  -We explained that if this is a viral pharyngitis frequently can take 7 to 10  days to run its course   99441 5-10 [id number] 11-20 99443 21-30 I did not refer this patient for an OV in the next 24 hours for this/these issue(s).  I discussed the assessment and treatment plan with the patient. The patient was provided an opportunity to ask questions and all were answered. The patient agreed with the plan and demonstrated an understanding of the instructions.   The patient was advised to call back or  seek an in-person evaluation if the symptoms worsen or if the condition fails to improve as anticipated.  I provided 15 minutes of non-face-to-face time during this encounter.   Carolann Littler, MD

## 2019-08-24 NOTE — Addendum Note (Signed)
Addended by: Eulas Post on: 08/24/2019 08:23 AM   Modules accepted: Orders

## 2019-08-25 ENCOUNTER — Encounter (HOSPITAL_COMMUNITY): Payer: Self-pay | Admitting: Emergency Medicine

## 2019-08-25 ENCOUNTER — Other Ambulatory Visit: Payer: Self-pay

## 2019-08-25 ENCOUNTER — Ambulatory Visit (HOSPITAL_COMMUNITY)
Admission: EM | Admit: 2019-08-25 | Discharge: 2019-08-25 | Disposition: A | Discharge status: Home / Self Care | Payer: Medicare Other | Attending: Family Medicine | Admitting: Family Medicine

## 2019-08-25 DIAGNOSIS — E785 Hyperlipidemia, unspecified: Secondary | ICD-10-CM | POA: Insufficient documentation

## 2019-08-25 DIAGNOSIS — G2581 Restless legs syndrome: Secondary | ICD-10-CM | POA: Diagnosis not present

## 2019-08-25 DIAGNOSIS — K589 Irritable bowel syndrome without diarrhea: Secondary | ICD-10-CM | POA: Diagnosis not present

## 2019-08-25 DIAGNOSIS — M1990 Unspecified osteoarthritis, unspecified site: Secondary | ICD-10-CM | POA: Diagnosis not present

## 2019-08-25 DIAGNOSIS — Z20828 Contact with and (suspected) exposure to other viral communicable diseases: Secondary | ICD-10-CM | POA: Insufficient documentation

## 2019-08-25 DIAGNOSIS — Z79899 Other long term (current) drug therapy: Secondary | ICD-10-CM | POA: Insufficient documentation

## 2019-08-25 DIAGNOSIS — M810 Age-related osteoporosis without current pathological fracture: Secondary | ICD-10-CM | POA: Insufficient documentation

## 2019-08-25 DIAGNOSIS — J029 Acute pharyngitis, unspecified: Secondary | ICD-10-CM | POA: Insufficient documentation

## 2019-08-25 DIAGNOSIS — K219 Gastro-esophageal reflux disease without esophagitis: Secondary | ICD-10-CM | POA: Insufficient documentation

## 2019-08-25 DIAGNOSIS — G4733 Obstructive sleep apnea (adult) (pediatric): Secondary | ICD-10-CM | POA: Insufficient documentation

## 2019-08-25 DIAGNOSIS — I5030 Unspecified diastolic (congestive) heart failure: Secondary | ICD-10-CM | POA: Insufficient documentation

## 2019-08-25 DIAGNOSIS — Z8673 Personal history of transient ischemic attack (TIA), and cerebral infarction without residual deficits: Secondary | ICD-10-CM | POA: Diagnosis not present

## 2019-08-25 NOTE — ED Triage Notes (Signed)
Pt c/o sore throat onset 6 days associated w/cough  Denies fevers, SOB  A&O x4... no acute distress ... ambulatory

## 2019-08-25 NOTE — ED Provider Notes (Signed)
[facility]    CSN: [id number] Arrival date & time: 08/25/19  1545      History   Chief Complaint Chief Complaint  Patient presents with  . Sore Throat    HPI Briana Fitzgerald is a 85 y.o. female.   Briana Fitzgerald presents with complaints of sore throat which she woke with 12/4. No fevers. Has been taking tylenol and ibuprofen which have helped. Throat does feel better. Occasional cough at night. No cough through the day. No new nasal drainage. No chest pain , no shortness of breath.  No new headache or body aches. No gi symptoms. No known ill contacts. She has been staying at home, family gets her groceries. Has been in contact with her PCP two days ago who recommended supportive cares which have been helping patient. Patient seeking covid-19 testing today.    ROS per HPI, negative if not otherwise mentioned.      Past Medical History:  Diagnosis Date  . Arthritis   . BACK PAIN 07/18/2008  . Bowel obstruction ([place])   . CONSTIPATION 10/02/2010  . GERD 10/02/2010  . Headache   . IBS (irritable bowel syndrome)   . LAMINECTOMY, LUMBAR, HX OF 06/16/2008  . OSTEOARTHRITIS 10/02/2010  . Pneumonia   . RESTLESS LEG SYNDROME 10/02/2010  . SACROILIAC JOINT DYSFUNCTION 06/16/2008  . Sleep apnea   . STYE 10/02/2010  . TRANSIENT ISCHEMIC ATTACKS, HX OF 10/02/2010    Patient Active Problem List   Diagnosis Date Noted  . Degenerative disc disease, cervical 08/20/2019  . Lymphocytosis 05/11/2018  . Diastolic dysfunction with heart failure ([place]) 05/06/2018  . Obstructive sleep apnea 04/13/2014  . History of migraine headaches 01/12/2013  . Hyperlipidemia 09/02/2011  . Osteoporosis 05/01/2011  . Nonallopathic lesion of sacral region 04/17/2011  . RESTLESS LEG SYNDROME 10/02/2010  . STYE 10/02/2010  . GERD 10/02/2010  . CONSTIPATION 10/02/2010  . OSTEOARTHRITIS 10/02/2010  . TRANSIENT ISCHEMIC ATTACKS, HX OF 10/02/2010  . BACK PAIN 07/18/2008  . Disorder of sacrum  06/16/2008  . UNEQUAL LEG LENGTH 06/16/2008  . SCOLIOSIS, MILD 06/16/2008  . LAMINECTOMY, LUMBAR, HX OF 06/16/2008    Past Surgical History:  Procedure Laterality Date  . ABDOMINAL HYSTERECTOMY  1979   TAH, fibroids  . APOGEE / [place]  2005  . APPENDECTOMY  1941  . EYE SURGERY  2005   catarac,both eyes  . LIVER BIOPSY  1997   benign, hemangioma resection  . SPINE SURGERY  2006   laminectomy    OB History   No obstetric history on file.      Home Medications    Prior to Admission medications   Medication Sig Start Date End Date Taking? Authorizing Provider  acetaminophen (TYLENOL) 500 MG tablet Take 500 mg by mouth every 6 (six) hours as needed.    [provider]  butalbital-aspirin-caffeine Acquanetta Chain) 50-325-40 MG capsule Take 1 capsule by mouth every 6 (six) hours as needed for headache. 07/03/17   [name], [name], MD  Calcium Carbonate-Vitamin D (CALTRATE 600+D) 600-400 MG-UNIT tablet Take 1 tablet by mouth daily.    [provider]  erythromycin ophthalmic ointment 1 ribbon 3 times a day to the right eye for 10 days 07/28/19   Isaac Bliss, Rayford Halsted, MD  furosemide (LASIX) 40 MG tablet TAKE 1 TABLET BY MOUTH EVERY DAY 12/29/18   Jerline Pain, MD  Misc Natural Products (TART CHERRY ADVANCED PO) Take 1,200 mg by mouth daily.  [provider]  Omega-3 Fatty Acids (SALMON OIL-1000 PO) Take 1 tablet by mouth daily.    [provider]  pramipexole (MIRAPEX) 0.5 MG tablet TAKE 1 TABLET (0.5 MG TOTAL) BY MOUTH AT BEDTIME. 06/11/19   [name], [name], MD  pyridoxine (B-6) 100 MG tablet Take 100 mg by mouth daily.    [provider]  rosuvastatin (CRESTOR) 5 MG tablet TAKE 1 TABLET BY MOUTH EVERY DAY 08/05/19   Jerline Pain, MD  vitamin B-12 (CYANOCOBALAMIN) 500 MCG tablet Take 500 mcg by mouth daily.    [provider]  vitamin C (ASCORBIC ACID) 500 MG tablet Take 500 mg by mouth daily.    [provider]  vitamin E 400 UNIT capsule Take 400 Units by mouth daily.    [provider]    Family History Family History  Problem Relation Age of Onset  . Stroke Father   . Diabetes Father        type ll  . Pancreatitis Brother   . Breast cancer Paternal Aunt        aunt  . Colon cancer Neg Hx   . Stomach cancer Neg Hx   . Rectal cancer Neg Hx   . Liver cancer Neg Hx   . Esophageal cancer Neg Hx     Social History Social History   Tobacco Use  . Smoking status: Never Smoker  . Smokeless tobacco: Never Used  Substance Use Topics  . Alcohol use: Yes    Comment: at dinner with friends, 2-3 per month - wine  . Drug use: No     Allergies   Patient has no known allergies.   Review of Systems Review of Systems   Physical Exam Triage Vital Signs ED Triage Vitals  Enc Vitals Group     BP 08/25/19 1651 136/63     Pulse Rate 08/25/19 1651 74     Resp 08/25/19 1651 20     Temp 08/25/19 1651 98.9 F (37.2 C)     Temp Source 08/25/19 1651 Oral     SpO2 08/25/19 1651 95 %     Weight --      Height --      Head Circumference --      Peak Flow --      Pain Score 08/25/19 1653 0     Pain Loc --      Pain Edu? --      Excl. in [place]? --    No data found.  Updated Vital Signs BP 136/63 (BP Location: Left Arm)   Pulse 74   Temp 98.9 F (37.2 C) (Oral)   Resp 20   SpO2 95%    Physical Exam Constitutional:      General: She is not in acute distress.    Appearance: She is well-developed.  HENT:     Mouth/Throat:     Pharynx: No pharyngeal swelling, oropharyngeal exudate or uvula swelling.     Tonsils: No tonsillar exudate.  Cardiovascular:     Rate and Rhythm: Normal rate.  Pulmonary:     Effort: Pulmonary effort is normal.     Breath sounds: Normal breath sounds.  Skin:    General: Skin is warm and dry.  Neurological:     Mental Status: She is alert and oriented to person, place, and time.      UC Treatments / Results  Labs (all labs  ordered are listed, but only abnormal results are displayed) Labs  Reviewed  NOVEL CORONAVIRUS, NAA (HOSP ORDER, SEND-OUT TO REF LAB; TAT 18-24 HRS)    EKG   Radiology No results found.  Procedures Procedures (including critical care time)  Medications Ordered in UC Medications - No data to display  Initial Impression / Assessment and Plan / UC Course  I have reviewed the triage vital signs and the nursing notes.  Pertinent labs & imaging results that were available during my care of the patient were reviewed by me and considered in my medical decision making (see chart for details).     Non toxic. Benign physical exam. Afebrile. No increased work of breathing. No shortness of breath. Throat exam benign. covid testing collected and pending. Return precautions provided. Patient verbalized understanding and agreeable to plan. Ambulatory out of clinic without difficulty.    Final Clinical Impressions(s) / UC Diagnoses   Final diagnoses:  Pharyngitis, unspecified etiology     Discharge Instructions     Self isolate until covid results are back and negative.  Will notify you by phone of any positive findings. Your negative results will be sent through your MyChart.   You may also call us to check your results if you are interested, I would recommend calling 12/12 or 12/13.  Throat lozenges, gargles, chloraseptic spray, warm teas, popsicles etc to help with throat pain.   If any worsening of symptoms please return to be seen.     ED Prescriptions    None     PDMP not reviewed this encounter.   Zigmund Gottron, NP 08/25/19 1719

## 2019-08-25 NOTE — Discharge Instructions (Signed)
Self isolate until covid results are back and negative.  Will notify you by phone of any positive findings. Your negative results will be sent through your MyChart.   You may also call us to check your results if you are interested, I would recommend calling 12/12 or 12/13.  Throat lozenges, gargles, chloraseptic spray, warm teas, popsicles etc to help with throat pain.   If any worsening of symptoms please return to be seen.

## 2019-08-27 LAB — NOVEL CORONAVIRUS, NAA (HOSP ORDER, SEND-OUT TO REF LAB; TAT 18-24 HRS): SARS-CoV-2, NAA: NOT DETECTED

## 2019-09-06 ENCOUNTER — Telehealth: Payer: Self-pay | Admitting: Cardiology

## 2019-09-06 NOTE — Telephone Encounter (Signed)
New message   Pt c/o medication issue:  1. Name of Medication: rosuvastatin (CRESTOR) 5 MG tablet  2. How are you currently taking this medication (dosage and times per day)?as written  3. Are you having a reaction (difficulty breathing--STAT)?no  4. What is your medication issue? Patient states that she is out medication and needs a new prescription for this medication. Please send to [id number] - [place], [place] - [address]

## 2019-09-23 ENCOUNTER — Encounter: Payer: Self-pay | Admitting: Family Medicine

## 2019-09-23 ENCOUNTER — Ambulatory Visit (INDEPENDENT_AMBULATORY_CARE_PROVIDER_SITE_OTHER): Payer: Medicare Other | Admitting: Family Medicine

## 2019-09-23 ENCOUNTER — Other Ambulatory Visit: Payer: Self-pay

## 2019-09-23 VITALS — BP 120/68 | HR 81 | Ht 64.5 in | Wt 130.0 lb

## 2019-09-23 DIAGNOSIS — M545 Low back pain: Secondary | ICD-10-CM

## 2019-09-23 DIAGNOSIS — I77810 Thoracic aortic ectasia: Secondary | ICD-10-CM | POA: Insufficient documentation

## 2019-09-23 DIAGNOSIS — G8929 Other chronic pain: Secondary | ICD-10-CM

## 2019-09-23 DIAGNOSIS — M5459 Other low back pain: Secondary | ICD-10-CM

## 2019-09-23 DIAGNOSIS — I2510 Atherosclerotic heart disease of native coronary artery without angina pectoris: Secondary | ICD-10-CM | POA: Insufficient documentation

## 2019-09-23 NOTE — Progress Notes (Signed)
[facility] [address] [place] Phone: [phone] Subjective:   This visit occurred during the SARS-CoV-2 public health emergency.  Safety protocols were in place, including screening questions prior to the visit, additional usage of staff PPE, and extensive cleaning of exam room while observing appropriate contact time as indicated for disinfecting solutions.  Fontaine No, am serving as a scribe for Dr. Hulan Saas.  I'm seeing this patient by the request  of:    CC: Low back pain follow-up  [id number]   08/20/2019 Degenerative disc disease.  Discussed which activities to do which wants to avoid.  Patient is to increase activity as tolerated.  Increase activity slowly.  Follow-up again in 4 to 8 weeks x-rays pending very low-dose gabapentin was considered but declined secondary to patient having difficulty with medications  Update 09/23/2019 Briana Fitzgerald is a 86 y.o. female coming in with complaint of neck and bilateral hip pain. Patient states that her hip pain has improved after the injections.   Continues to have neck and back pain. Feels crepitus with rotation. Also having pain in the thoracic region.  Patient continues to have pain all the time.  Patient states she has never without some discomfort.  Has been doing the exercises intermittently. Patient feels like something could just be popped.  Has weakness in the legs with a lot of walking.      Past Medical History:  Diagnosis Date  . Arthritis   . BACK PAIN 07/18/2008  . Bowel obstruction ([place])   . CONSTIPATION 10/02/2010  . GERD 10/02/2010  . Headache   . IBS (irritable bowel syndrome)   . LAMINECTOMY, LUMBAR, HX OF 06/16/2008  . OSTEOARTHRITIS 10/02/2010  . Pneumonia   . RESTLESS LEG SYNDROME 10/02/2010  . SACROILIAC JOINT DYSFUNCTION 06/16/2008  . Sleep apnea   . STYE 10/02/2010  . TRANSIENT ISCHEMIC ATTACKS, HX OF 10/02/2010   Past Surgical History:   Procedure Laterality Date  . ABDOMINAL HYSTERECTOMY  1979   TAH, fibroids  . [place] / [place]  2005  . APPENDECTOMY  1941  . EYE SURGERY  2005   catarac,both eyes  . LIVER BIOPSY  1997   benign, hemangioma resection  . SPINE SURGERY  2006   laminectomy   Social History   Socioeconomic History  . Marital status: Married    Spouse name: Not on file  . Number of children: 2  . Years of education: Not on file  . Highest education level: Not on file  Occupational History  . Occupation: retired  Tobacco Use  . Smoking status: Never Smoker  . Smokeless tobacco: Never Used  Substance and Sexual Activity  . Alcohol use: Yes    Comment: at dinner with friends, 2-3 per month - wine  . Drug use: No  . Sexual activity: Not on file  Other Topics Concern  . Not on file  Social History Narrative  . Not on file   Social Determinants of Health   Financial Resource Strain:   . Difficulty of Paying Living Expenses: Not on file  Food Insecurity:   . Worried About Charity fundraiser in the Last Year: Not on file  . Ran Out of Food in the Last Year: Not on file  Transportation Needs:   . Lack of Transportation (Medical): Not on file  . Lack of Transportation (Non-Medical): Not on file  Physical Activity:   . Days of Exercise per Week: Not  on file  . Minutes of Exercise per Session: Not on file  Stress:   . Feeling of Stress : Not on file  Social Connections:   . Frequency of Communication with Friends and Family: Not on file  . Frequency of Social Gatherings with Friends and Family: Not on file  . Attends Religious Services: Not on file  . Active Member of Clubs or Organizations: Not on file  . Attends Archivist Meetings: Not on file  . Marital Status: Not on file   No Known Allergies Family History  Problem Relation Age of Onset  . Stroke Father   . Diabetes Father        type ll  . Pancreatitis Brother   . Breast cancer Paternal Aunt        aunt  .  Colon cancer Neg Hx   . Stomach cancer Neg Hx   . Rectal cancer Neg Hx   . Liver cancer Neg Hx   . Esophageal cancer Neg Hx      Current Outpatient Medications (Cardiovascular):  .  furosemide (LASIX) 40 MG tablet, TAKE 1 TABLET BY MOUTH EVERY DAY .  rosuvastatin (CRESTOR) 5 MG tablet, TAKE 1 TABLET BY MOUTH EVERY DAY   Current Outpatient Medications (Analgesics):  .  acetaminophen (TYLENOL) 500 MG tablet, Take 500 mg by mouth every 6 (six) hours as needed. .  butalbital-aspirin-caffeine (FIORINAL) 50-325-40 MG capsule, Take 1 capsule by mouth every 6 (six) hours as needed for headache.  Current Outpatient Medications (Hematological):  .  vitamin B-12 (CYANOCOBALAMIN) 500 MCG tablet, Take 500 mcg by mouth daily.  Current Outpatient Medications (Other):  Briana Fitzgerald  Calcium Carbonate-Vitamin D (CALTRATE 600+D) 600-400 MG-UNIT tablet, Take 1 tablet by mouth daily. Briana Fitzgerald  erythromycin ophthalmic ointment, 1 ribbon 3 times a day to the right eye for 10 days .  Misc Natural Products (TART CHERRY ADVANCED PO), Take 1,200 mg by mouth daily. .  Omega-3 Fatty Acids (SALMON OIL-1000 PO), Take 1 tablet by mouth daily. .  pramipexole (MIRAPEX) 0.5 MG tablet, TAKE 1 TABLET (0.5 MG TOTAL) BY MOUTH AT BEDTIME. Briana Fitzgerald  pyridoxine (B-6) 100 MG tablet, Take 100 mg by mouth daily. .  vitamin C (ASCORBIC ACID) 500 MG tablet, Take 500 mg by mouth daily. .  vitamin E 400 UNIT capsule, Take 400 Units by mouth daily.    Past medical history, social, surgical and family history all reviewed in electronic medical record.  No pertanent information unless stated regarding to the chief complaint.   Review of Systems:  No headache, visual changes, nausea, vomiting, diarrhea, constipation, dizziness, abdominal pain, skin rash, fevers, chills, night sweats, weight loss, swollen lymph nodes, body aches, joint swelling, muscle aches, chest pain, shortness of breath, mood changes.   Objective  There were no vitals taken for this  visit. Systems examined below as of    General: No apparent distress alert and oriented x3 mood and affect normal, dressed appropriately.  HEENT: Pupils equal, extraocular movements intact  Respiratory: Patient's speak in full sentences and does not appear short of breath  Cardiovascular: No lower extremity edema, non tender, no erythema  Skin: Warm dry intact with no signs of infection or rash on extremities or on axial skeleton.  Abdomen: Soft nontender  Neuro: Cranial nerves II through XII are intact, neurovascularly intact in all extremities with 2+ DTRs and 2+ pulses.  Lymph: No lymphadenopathy of posterior or anterior cervical chain or axillae bilaterally.  Gait n antalgic MSK: Severe  arthritic changes of multiple joints  Patient low back and does have incision from previous surgeries.  Patient does have significant tightness in the paraspinal musculature of the lumbar spine.  Multiple trigger points noted in this area.  This seems to be where patient is more severe.  Significant tightness with the hamstrings.  Patient does have some arthritic changes.  Scoliosis noted of the lumbar spine. Neurovascularly intact distally by 4-5 strength but seems to be symmetric  After verbal consent patient was prepped with alcohol swabs and with a 25-gauge half inch needle injected into 6 distinct trigger points in the lumbar spine region.  Patient tolerated the procedure well.  Total of 4 cc of 0.5% Marcaine and 1 cc of Kenalog 40 mg/mL used.  Minimal blood loss.  Postinjection instructions given.  Band-Aids placed    Impression and Recommendations:     This case required medical decision making of moderate complexity. The above documentation has been reviewed and is accurate and complete Jacqualin Combes       Note: This dictation was prepared with Dragon dictation along with smaller phrase technology. Any transcriptional errors that result from this process are unintentional.

## 2019-09-23 NOTE — Progress Notes (Signed)
Virtual Visit via Telephone Note   This visit type was conducted due to national recommendations for restrictions regarding the COVID-19 Pandemic (e.g. social distancing) in an effort to limit this patient's exposure and mitigate transmission in our community.  Due to her co-morbid illnesses, this patient is at least at moderate risk for complications without adequate follow up.  This format is felt to be most appropriate for this patient at this time.  The patient did not have access to video technology/had technical difficulties with video requiring transitioning to audio format only (telephone).  All issues noted in this document were discussed and addressed.  No physical exam could be performed with this format.  Please refer to the patient's chart for her  consent to telehealth for [facility].   Date:  09/24/2019   ID:  Briana Fitzgerald, DOB August 03, 1932, MRN [id number]  Patient Location: Home Provider Location: Home  PCP:  Eulas Post, MD  Cardiologist:  Candee Furbish, MD   Electrophysiologist:  None   Evaluation Performed:  Follow-Up Visit  Chief Complaint:  Hyperlipidemia   History of Present Illness:    Briana Fitzgerald is a 86 y.o. female with    Coronary artery disease   CT 10/19 with coronary calcification  Dilated ascending aorta  CT 10/19: 3.9 cm   Aortic atherosclerosis  Hx of TIA  CLL  Splenomegaly   OSA  She was last seen by Dr. Marlou Porch in November 2019.  At that time, he discussed proceeding with cardiac catheterization with the patient.  She was not interested in proceeding with an invasive approach.  It looks like stress testing was discussed but never arranged.  She was placed on rosuvastatin due to coronary calcification on CT scan.  It was recommended that she follow-up with cardiology as needed.  However, she recently called in for a refill on her rosuvastatin and was set up to see me today.  She has not had any chest discomfort with  exertion.  She has chronic shortness of breath with exertion.  This is no worse.  She does not have orthopnea.  She has dependent lower extremity edema which improves with elevation.  She has not had any weight changes.  She has not had syncope.  Past Medical History:  Diagnosis Date  . Arthritis   . BACK PAIN 07/18/2008  . Bowel obstruction ([place])   . CONSTIPATION 10/02/2010  . GERD 10/02/2010  . Headache   . IBS (irritable bowel syndrome)   . LAMINECTOMY, LUMBAR, HX OF 06/16/2008  . OSTEOARTHRITIS 10/02/2010  . Pneumonia   . RESTLESS LEG SYNDROME 10/02/2010  . SACROILIAC JOINT DYSFUNCTION 06/16/2008  . Sleep apnea   . STYE 10/02/2010  . TRANSIENT ISCHEMIC ATTACKS, HX OF 10/02/2010   Past Surgical History:  Procedure Laterality Date  . ABDOMINAL HYSTERECTOMY  1979   TAH, fibroids  . [place] / [place]  2005  . APPENDECTOMY  1941  . EYE SURGERY  2005   catarac,both eyes  . LIVER BIOPSY  1997   benign, hemangioma resection  . SPINE SURGERY  2006   laminectomy     Current Meds  Medication Sig  . acetaminophen (TYLENOL) 500 MG tablet Take 500 mg by mouth every 6 (six) hours as needed.  . butalbital-aspirin-caffeine (FIORINAL) 50-325-40 MG capsule Take 1 capsule by mouth every 6 (six) hours as needed for headache.  . Calcium Carbonate-Vitamin D (CALTRATE 600+D) 600-400 MG-UNIT tablet Take 1 tablet by mouth daily.  . furosemide (  LASIX) 40 MG tablet TAKE 1 TABLET BY MOUTH EVERY DAY  . Misc Natural Products (TART CHERRY ADVANCED PO) Take 1,200 mg by mouth daily.  . Omega-3 Fatty Acids (SALMON OIL-1000 PO) Take 1 tablet by mouth daily.  . pramipexole (MIRAPEX) 0.5 MG tablet TAKE 1 TABLET (0.5 MG TOTAL) BY MOUTH AT BEDTIME.  Marland Kitchen pyridoxine (B-6) 100 MG tablet Take 100 mg by mouth daily.  . rosuvastatin (CRESTOR) 5 MG tablet TAKE 1 TABLET BY MOUTH EVERY DAY  . vitamin B-12 (CYANOCOBALAMIN) 500 MCG tablet Take 500 mcg by mouth daily.  . vitamin C (ASCORBIC ACID) 500 MG tablet Take 500 mg  by mouth daily.  . vitamin E 400 UNIT capsule Take 400 Units by mouth daily.     Allergies:   Patient has no known allergies.   Social History   Tobacco Use  . Smoking status: Never Smoker  . Smokeless tobacco: Never Used  Substance Use Topics  . Alcohol use: Yes    Comment: at dinner with friends, 2-3 per month - wine  . Drug use: No     Family Hx: The patient's family history includes Breast cancer in her paternal aunt; Diabetes in her father; Pancreatitis in her brother; Stroke in her father. There is no history of Colon cancer, Stomach cancer, Rectal cancer, Liver cancer, or Esophageal cancer.  ROS:   Please see the history of present illness.    All other systems reviewed and are negative.   Prior CV studies:   The following studies were reviewed today:  Chest CTA 07/02/18 IMPRESSION: 1. No significant aneurysmal disease of the thoracic aorta. The ascending thoracic aorta is top-normal in caliber measuring 3.9 cm in greatest diameter. 2. Coronary atherosclerosis with mildly calcified plaque in the distribution of the LAD and left circumflex coronary arteries. 3. Probable underlying pulmonary hypertension with dilated main pulmonary artery. 4. Evidence of probable pulmonary venous hypertension without overt pulmonary edema. 5. Visualized upper abdomen shows significant splenomegaly and potentially left-sided upper abdominal retroperitoneal lymphadenopathy. Underlying hematologic disease such as leukemia or lymphoma cannot be excluded. Recommend formal CT of the abdomen and pelvis with contrast.   Echocardiogram 05/19/18 EF 55-60, no RWMA, Gr 2 DD, trivial AI, MAC, PASP 36  Labs/Other Tests and Data Reviewed:    EKG:  No ECG reviewed.  Recent Labs: 07/15/2019: Hemoglobin 13.4; Platelet Count 100   Recent Lipid Panel Lab Results  Component Value Date/Time   CHOL 138 01/28/2014 09:55 AM   TRIG 76.0 01/28/2014 09:55 AM   HDL 36.10 (L) 01/28/2014 09:55 AM    CHOLHDL 4 01/28/2014 09:55 AM   LDLCALC 87 01/28/2014 09:55 AM    Wt Readings from Last 3 Encounters:  09/24/19 125 lb (56.7 kg)  09/23/19 130 lb (59 kg)  08/20/19 130 lb (59 kg)     Objective:    Vital Signs:  BP 120/68   Pulse 81   Ht _0  (1.626 m)   Wt 125 lb (56.7 kg)   SpO2 98%   BMI 21.46 kg/m    VITAL SIGNS:  reviewed GEN:  no acute distress RESPIRATORY:  No labored breathing noted NEURO:  Alert and oriented PSYCH:  Normal mood  ASSESSMENT & PLAN:    1. Coronary artery calcification seen on CT scan 2. Aortic atherosclerosis ([place]) 3. Other hyperlipidemia As noted, she was seen last by Dr. Marlou Porch in November 2019.  She has evidence of chronic calcification on CT.  She was placed on low-dose rosuvastatin at that  time for risk reduction.  The patient was not aware of why she was on cholesterol medication.  I reviewed with her today the indications for taking statin therapy and the rationale for risk reduction.  She would like a copy of her echocardiogram and CT scan report.  She is not certain she can pull this up on MyChart.  We discussed the previous plan with Dr. Marlou Porch from November 2019.  She agrees to follow-up with cardiology as needed.  Her rosuvastatin can be managed by primary care and she will obtain future refills from her PCP.  4. Aortic root dilatation (HCC) 3.9 cm ascending thoracic aorta by CT scan in October 2019.   Time:   Today, I have spent 15 minutes with the patient with telehealth technology discussing the above problems.     Medication Adjustments/Labs and Tests Ordered: Current medicines are reviewed at length with the patient today.  Concerns regarding medicines are outlined above.   Tests Ordered: No orders of the defined types were placed in this encounter.   Medication Changes: No orders of the defined types were placed in this encounter.   Follow Up:    prn  Signed, Richardson Dopp, PA-C  09/24/2019 9:14 AM    [place]

## 2019-09-23 NOTE — Patient Instructions (Addendum)
Good to see you PT will call you Continue vitamins See me again in 6-8 weeks

## 2019-09-23 NOTE — Assessment & Plan Note (Signed)
Patient given injection today in bilateral paraspinal musculature of the lumbar spine.  Due to patient's comorbidities as well as BMI I do not think patient is a candidate for osteopathic manipulation.  Attempted the trigger points instead at this time.  Encourage patient to look into the home health physical therapy or we can refer her to a physical therapy area that would be safer during the coronavirus outbreak.  Patient feels like she does need something else to help her with some of the pain.  No changes in medication at this time.  Patient will follow up with me again in 6-week interval.

## 2019-09-24 ENCOUNTER — Telehealth: Payer: Self-pay | Admitting: *Deleted

## 2019-09-24 ENCOUNTER — Telehealth (INDEPENDENT_AMBULATORY_CARE_PROVIDER_SITE_OTHER): Payer: Medicare Other | Admitting: Physician Assistant

## 2019-09-24 VITALS — BP 120/68 | HR 81 | Ht 64.0 in | Wt 125.0 lb

## 2019-09-24 DIAGNOSIS — I700 Atherosclerosis of aorta: Secondary | ICD-10-CM

## 2019-09-24 DIAGNOSIS — I77810 Thoracic aortic ectasia: Secondary | ICD-10-CM

## 2019-09-24 DIAGNOSIS — I2510 Atherosclerotic heart disease of native coronary artery without angina pectoris: Secondary | ICD-10-CM

## 2019-09-24 DIAGNOSIS — E7849 Other hyperlipidemia: Secondary | ICD-10-CM

## 2019-09-24 NOTE — Telephone Encounter (Signed)

## 2019-09-24 NOTE — Patient Instructions (Signed)
Medication Instructions:  Your physician recommends that you continue on your current medications as directed. Please refer to the Current Medication list given to you today.  *If you need a refill on your cardiac medications before your next appointment, please call your pharmacy*  Lab Work: none If you have labs (blood work) drawn today and your tests are completely normal, you will receive your results only by: Marland Kitchen MyChart Message (if you have MyChart) OR . A paper copy in the mail If you have any lab test that is abnormal or we need to change your treatment, we will call you to review the results.  Testing/Procedures: none  Follow-Up: At [facility], you and your health needs are our priority.  As part of our continuing mission to provide you with exceptional heart care, we have created designated Provider Care Teams.  These Care Teams include your primary Cardiologist (physician) and Advanced Practice Providers (APPs -  Physician Assistants and Nurse Practitioners) who all work together to provide you with the care you need, when you need it.  Follow up as needed with Dr Marlou Porch

## 2019-10-01 ENCOUNTER — Telehealth: Payer: Self-pay | Admitting: Physician Assistant

## 2019-10-01 NOTE — Telephone Encounter (Signed)
Patient calling stating she was supposed to get her lab results sent to her from Sept. 2019 and Oct. 2019, but has not received them. She would like to speak to a nurse.

## 2019-10-01 NOTE — Telephone Encounter (Signed)
I called and spoke with patient, lab results printed and address verified to mail lab results from October 2019 and November 2019.

## 2019-10-04 ENCOUNTER — Telehealth: Payer: Self-pay | Admitting: Cardiology

## 2019-10-04 NOTE — Telephone Encounter (Signed)
  Patient does not have a computer access for her lab results and would like them to be mailed to her. Address verified as correct. She needs date range May 19, 2018 to November 2019.

## 2019-10-06 ENCOUNTER — Encounter: Payer: Self-pay | Admitting: Family Medicine

## 2019-10-06 ENCOUNTER — Ambulatory Visit (INDEPENDENT_AMBULATORY_CARE_PROVIDER_SITE_OTHER): Payer: Medicare Other | Admitting: Family Medicine

## 2019-10-06 ENCOUNTER — Other Ambulatory Visit: Payer: Self-pay

## 2019-10-06 VITALS — BP 132/68 | HR 78 | Temp 97.6°F | Ht 63.5 in | Wt 129.0 lb

## 2019-10-06 DIAGNOSIS — E7849 Other hyperlipidemia: Secondary | ICD-10-CM

## 2019-10-06 DIAGNOSIS — Z0000 Encounter for general adult medical examination without abnormal findings: Secondary | ICD-10-CM

## 2019-10-06 DIAGNOSIS — M1991 Primary osteoarthritis, unspecified site: Secondary | ICD-10-CM

## 2019-10-06 DIAGNOSIS — I5030 Unspecified diastolic (congestive) heart failure: Secondary | ICD-10-CM

## 2019-10-06 LAB — HEPATIC FUNCTION PANEL
ALT: 17 U/L (ref 0–35)
AST: 18 U/L (ref 0–37)
Albumin: 4.3 g/dL (ref 3.5–5.2)
Alkaline Phosphatase: 77 U/L (ref 39–117)
Bilirubin, Direct: 0.4 mg/dL — ABNORMAL HIGH (ref 0.0–0.3)
Total Bilirubin: 1.4 mg/dL — ABNORMAL HIGH (ref 0.2–1.2)
Total Protein: 7.1 g/dL (ref 6.0–8.3)

## 2019-10-06 LAB — LIPID PANEL
Cholesterol: 101 mg/dL (ref 0–200)
HDL: 50.6 mg/dL (ref >39.00)
LDL Cholesterol: 38 mg/dL (ref 0–99)
NonHDL: 50.47
Total CHOL/HDL Ratio: 2
Triglycerides: 62 mg/dL (ref 0.0–149.0)
VLDL: 12.4 mg/dL (ref 0.0–40.0)

## 2019-10-06 LAB — BASIC METABOLIC PANEL
BUN: 17 mg/dL (ref 6–23)
CO2: 33 mEq/L — ABNORMAL HIGH (ref 19–32)
Calcium: 10.6 mg/dL — ABNORMAL HIGH (ref 8.4–10.5)
Chloride: 103 mEq/L (ref 96–112)
Creatinine, Ser: 0.75 mg/dL (ref 0.40–1.20)
GFR: 73.32 mL/min (ref >60.00)
Glucose, Bld: 75 mg/dL (ref 70–99)
Potassium: 4.2 mEq/L (ref 3.5–5.1)
Sodium: 141 mEq/L (ref 135–145)

## 2019-10-06 NOTE — Progress Notes (Signed)
Subjective:     Patient ID: Briana Fitzgerald, female   DOB: Feb 02, 1932, 86 y.o.   MRN: [id number]  HPI Briana Fitzgerald is seen for physical exam and medical follow-up.  Briana Fitzgerald has history of diastolic heart failure, GERD, osteoarthritis involving multiple joints with chronic back pain, osteoporosis, restless leg syndrome, and hyperlipidemia. Briana Fitzgerald has history of some chronic thrombocytopenia and history of lymphocytosis.  Briana Fitzgerald been followed by hematology.  Recent CBC was unremarkable for any significant change.  Briana Fitzgerald takes low-dose Crestor for hyperlipidemia.  Restless leg symptoms treated with Mirapex and stable.. Briana Fitzgerald is on Lasix 20 mg daily with history of diastolic heart failure.  No recent increased dyspnea.  Briana Fitzgerald has some dyspnea with exertion at baseline.  No peripheral edema.  Needs follow-up electrolytes.  Also needs follow-up lipids.  Health maintenance reviewed.  Pneumonia vaccines up-to-date.  Flu vaccine already given.  Briana Fitzgerald is scheduled for Covid vaccine tomorrow.  Briana Fitzgerald states Briana Fitzgerald has had 1 fall during the past year.  No associated injuries.  Generally feels fairly stable on her feet though.  Past Medical History:  Diagnosis Date  . Arthritis   . BACK PAIN 07/18/2008  . Bowel obstruction ([place])   . CONSTIPATION 10/02/2010  . GERD 10/02/2010  . Headache   . IBS (irritable bowel syndrome)   . LAMINECTOMY, LUMBAR, HX OF 06/16/2008  . OSTEOARTHRITIS 10/02/2010  . Pneumonia   . RESTLESS LEG SYNDROME 10/02/2010  . SACROILIAC JOINT DYSFUNCTION 06/16/2008  . Sleep apnea   . STYE 10/02/2010  . TRANSIENT ISCHEMIC ATTACKS, HX OF 10/02/2010   Past Surgical History:  Procedure Laterality Date  . ABDOMINAL HYSTERECTOMY  1979   TAH, fibroids  . [place] / [place]  2005  . APPENDECTOMY  1941  . EYE SURGERY  2005   catarac,both eyes  . LIVER BIOPSY  1997   benign, hemangioma resection  . SPINE SURGERY  2006   laminectomy    reports that Briana Fitzgerald has never smoked. Briana Fitzgerald has never used smokeless  tobacco. Briana Fitzgerald reports current alcohol use. Briana Fitzgerald reports that Briana Fitzgerald does not use drugs. family history includes Breast cancer in her paternal aunt; Diabetes in her father; Pancreatitis in her brother; Stroke in her father. No Known Allergies  Wt Readings from Last 3 Encounters:  10/06/19 129 lb (58.5 kg)  09/24/19 125 lb (56.7 kg)  09/23/19 130 lb (59 kg)     Review of Systems  Constitutional: Negative for appetite change, chills, fatigue, fever and unexpected weight change.  HENT: Negative for sore throat and trouble swallowing.   Eyes: Negative for visual disturbance.  Respiratory: Negative for cough, chest tightness, shortness of breath and wheezing.   Cardiovascular: Negative for chest pain, palpitations and leg swelling.  Gastrointestinal: Negative for abdominal pain.  Genitourinary: Negative for difficulty urinating and dysuria.  Musculoskeletal: Positive for arthralgias and back pain.  Neurological: Negative for dizziness, seizures, syncope, weakness, light-headedness and headaches.       Objective:   Physical Exam Vitals reviewed.  Constitutional:      Appearance: Normal appearance.  HENT:     Right Ear: Tympanic membrane and ear canal normal.     Left Ear: Tympanic membrane and ear canal normal.     Mouth/Throat:     Pharynx: Oropharynx is clear.  Cardiovascular:     Rate and Rhythm: Normal rate and regular rhythm.  Pulmonary:     Effort: Pulmonary effort is normal.     Breath sounds: Normal breath sounds.  Musculoskeletal:  Cervical back: Neck supple.     Right lower leg: No edema.     Left lower leg: No edema.  Lymphadenopathy:     Cervical: No cervical adenopathy.  Neurological:     General: No focal deficit present.     Mental Status: Briana Fitzgerald is alert and oriented to person, place, and time.     Cranial Nerves: No cranial nerve deficit.        Assessment:     #1 physical exam.  We discussed health maintenance issues as below  #2 hyperlipidemia treated  with low-dose Crestor.  Overdue for labs  #3 history of diastolic heart failure currently stable on low-dose furosemide.  Briana Fitzgerald had multiple questions regarding previous echocardiogram from 2019 and we reviewed that in some detail    Plan:     -Continue annual flu vaccine -Covid vaccine has been scheduled -Briana Fitzgerald has aged out of colonoscopy screening -Briana Fitzgerald does need tetanus but Briana Fitzgerald realizes insurance would not likely cover in the absence of injury -Check follow-up labs including basic metabolic panel, lipid, hepatic -Discussed fall prevention  Eulas Post MD [facility]

## 2019-10-06 NOTE — Patient Instructions (Signed)
Preventive Care 38 Years and Older, Female Preventive care refers to lifestyle choices and visits with your health care provider that can promote health and wellness. This includes:  A yearly physical exam. This is also called an annual well check.  Regular dental and eye exams.  Immunizations.  Screening for certain conditions.  Healthy lifestyle choices, such as diet and exercise. What can I expect for my preventive care visit? Physical exam Your health care provider will check:  Height and weight. These may be used to calculate body mass index (BMI), which is a measurement that tells if you are at a healthy weight.  Heart rate and blood pressure.  Your skin for abnormal spots. Counseling Your health care provider may ask you questions about:  Alcohol, tobacco, and drug use.  Emotional well-being.  Home and relationship well-being.  Sexual activity.  Eating habits.  History of falls.  Memory and ability to understand (cognition).  Work and work Statistician.  Pregnancy and menstrual history. What immunizations do I need?  Influenza (flu) vaccine  This is recommended every year. Tetanus, diphtheria, and pertussis (Tdap) vaccine  You may need a Td booster every 10 years. Varicella (chickenpox) vaccine  You may need this vaccine if you have not already been vaccinated. Zoster (shingles) vaccine  You may need this after age 33. Pneumococcal conjugate (PCV13) vaccine  One dose is recommended after age 33. Pneumococcal polysaccharide (PPSV23) vaccine  One dose is recommended after age 72. Measles, mumps, and rubella (MMR) vaccine  You may need at least one dose of MMR if you were born in 1957 or later. You may also need a second dose. Meningococcal conjugate (MenACWY) vaccine  You may need this if you have certain conditions. Hepatitis A vaccine  You may need this if you have certain conditions or if you travel or work in places where you may be exposed  to hepatitis A. Hepatitis B vaccine  You may need this if you have certain conditions or if you travel or work in places where you may be exposed to hepatitis B. Haemophilus influenzae type b (Hib) vaccine  You may need this if you have certain conditions. You may receive vaccines as individual doses or as more than one vaccine together in one shot (combination vaccines). Talk with your health care provider about the risks and benefits of combination vaccines. What tests do I need? Blood tests  Lipid and cholesterol levels. These may be checked every 5 years, or more frequently depending on your overall health.  Hepatitis C test.  Hepatitis B test. Screening  Lung cancer screening. You may have this screening every year starting at age 39 if you have a 30-pack-year history of smoking and currently smoke or have quit within the past 15 years.  Colorectal cancer screening. All adults should have this screening starting at age 36 and continuing until age 15. Your health care provider may recommend screening at age 23 if you are at increased risk. You will have tests every 1-10 years, depending on your results and the type of screening test.  Diabetes screening. This is done by checking your blood sugar (glucose) after you have not eaten for a while (fasting). You may have this done every 1-3 years.  Mammogram. This may be done every 1-2 years. Talk with your health care provider about how often you should have regular mammograms.  BRCA-related cancer screening. This may be done if you have a family history of breast, ovarian, tubal, or peritoneal cancers.  Other tests  Sexually transmitted disease (STD) testing.  Bone density scan. This is done to screen for osteoporosis. You may have this done starting at age 44. Follow these instructions at home: Eating and drinking  Eat a diet that includes fresh fruits and vegetables, whole grains, lean protein, and low-fat dairy products. Limit  your intake of foods with high amounts of sugar, saturated fats, and salt.  Take vitamin and mineral supplements as recommended by your health care provider.  Do not drink alcohol if your health care provider tells you not to drink.  If you drink alcohol: ? Limit how much you have to 0-1 drink a day. ? Be aware of how much alcohol is in your drink. In the [place] one drink equals one 12 oz bottle of beer (355 mL), one 5 oz glass of wine (148 mL), or one 1 oz glass of hard liquor (44 mL). Lifestyle  Take daily care of your teeth and gums.  Stay active. Exercise for at least 30 minutes on 5 or more days each week.  Do not use any products that contain nicotine or tobacco, such as cigarettes, e-cigarettes, and chewing tobacco. If you need help quitting, ask your health care provider.  If you are sexually active, practice safe sex. Use a condom or other form of protection in order to prevent STIs (sexually transmitted infections).  Talk with your health care provider about taking a low-dose aspirin or statin. What's next?  Go to your health care provider once a year for a well check visit.  Ask your health care provider how often you should have your eyes and teeth checked.  Stay up to date on all vaccines. This information is not intended to replace advice given to you by your health care provider. Make sure you discuss any questions you have with your health care provider. Document Revised: 08/27/2018 Document Reviewed: 08/27/2018 Elsevier Patient Education  2020 [employer].

## 2019-10-11 ENCOUNTER — Telehealth: Payer: Self-pay | Admitting: Family Medicine

## 2019-10-11 NOTE — Telephone Encounter (Signed)
Pt called in stating that she had requested her lab work from this year and she received labs from 2019 and she stated that she knows she had lab this year and would like to have a copy of them.  Pt is aware that she needs to fill out a release form in order to get them.

## 2019-10-13 NOTE — Telephone Encounter (Signed)
Please see request for medical records

## 2019-10-15 ENCOUNTER — Telehealth: Payer: Self-pay | Admitting: Family Medicine

## 2019-10-15 NOTE — Telephone Encounter (Signed)
I contacted the patient she said that she is not going to sit at the computer and try and figure it out.   I let her know that we can send her the medical release form to fill out and send back to [place].  I will put in the mail today might not get picked up till Monday

## 2019-10-15 NOTE — Telephone Encounter (Signed)
Pt is requesting to speak to you. Pt refused to give me any information and asked, for you to give him a call. Thanks   Pt's # [phone]

## 2019-10-15 NOTE — Telephone Encounter (Signed)
I called patient and she wants a copy of her recent labs from Dr. Elease Hashimoto on 10/06/2019.   Can you call patient and explain how to get from [facility]? Or discuss the release with her if she does not want to go through her MyChart?   Thank you!!

## 2019-10-25 ENCOUNTER — Telehealth: Payer: Self-pay | Admitting: Family Medicine

## 2019-10-25 NOTE — Telephone Encounter (Signed)
Pt is requesting to speak to you. Pt refused to give me any further information and requested a message be sent to you. Thanks   Pt's [phone]

## 2019-10-26 NOTE — Telephone Encounter (Signed)
Called patient and she requested that the medical release paper be mailed to her and she will fill it out and mail it back to [place]. She requested that once this has been sent back to [place] can authorized personel please send her a copy of her labs from 10/06/2019? Can you send this form to patient?

## 2019-10-28 NOTE — Telephone Encounter (Signed)
I sent the medical release form to the patient

## 2019-12-03 ENCOUNTER — Other Ambulatory Visit: Payer: Self-pay | Admitting: Cardiology

## 2019-12-06 ENCOUNTER — Other Ambulatory Visit: Payer: Self-pay | Admitting: Family Medicine

## 2019-12-06 NOTE — Telephone Encounter (Signed)
We are not the provider who fills this looks like it was filled on the 19th

## 2019-12-06 NOTE — Telephone Encounter (Signed)
Medication Refill: rosuvastatin 5mg  Pharmacy: [facility] Phone:

## 2020-01-13 ENCOUNTER — Ambulatory Visit: Payer: Medicare Other | Admitting: Oncology

## 2020-01-13 ENCOUNTER — Other Ambulatory Visit: Payer: Medicare Other

## 2020-02-15 ENCOUNTER — Ambulatory Visit (INDEPENDENT_AMBULATORY_CARE_PROVIDER_SITE_OTHER): Payer: Medicare Other | Admitting: Family Medicine

## 2020-02-15 ENCOUNTER — Ambulatory Visit (INDEPENDENT_AMBULATORY_CARE_PROVIDER_SITE_OTHER): Payer: Medicare Other

## 2020-02-15 ENCOUNTER — Encounter: Payer: Self-pay | Admitting: Family Medicine

## 2020-02-15 ENCOUNTER — Other Ambulatory Visit: Payer: Self-pay

## 2020-02-15 VITALS — BP 124/64 | HR 74 | Ht 63.5 in | Wt 133.2 lb

## 2020-02-15 DIAGNOSIS — M5416 Radiculopathy, lumbar region: Secondary | ICD-10-CM | POA: Diagnosis not present

## 2020-02-15 DIAGNOSIS — M7061 Trochanteric bursitis, right hip: Secondary | ICD-10-CM

## 2020-02-15 DIAGNOSIS — M545 Low back pain: Secondary | ICD-10-CM | POA: Diagnosis not present

## 2020-02-15 MED ORDER — GABAPENTIN 100 MG PO CAPS: 100.0000 mg | ORAL_CAPSULE | Freq: Three times a day (TID) | ORAL | 3 refills | Status: DC | PRN

## 2020-02-15 MED ORDER — PREDNISONE 10 MG PO TABS: 30.0000 mg | ORAL_TABLET | Freq: Every day | ORAL | 0 refills | Status: DC

## 2020-02-15 NOTE — Patient Instructions (Signed)
Thank you for coming in today. Plan for xray today and physical therapy.  Try prednisone and gabapentin.  Recheck in about 4 weeks.  Return or contact me sooner if not doing well or if there is a problem.  Ok to schedule with me or Dr Tamala Julian.   Come back or go to the emergency room if you notice new weakness new numbness problems walking or bowel or bladder problems.

## 2020-02-15 NOTE — Progress Notes (Signed)
I, Wendy Poet, LAT, ATC, am serving as scribe for Dr. Lynne Leader.  Briana Fitzgerald is a 86 y.o. female who presents to [place] at [facility] today for f/u of B hip pain.  She was last seen by Dr. Tamala Julian on 09/23/19 for low back pain and had trigger point injections in her B lumbar erector spinae.  Since her last visit, she reports R hip pain that flared up about 4-5 weeks w/ no known cause.  She locates her pain to her R lateral hip w/ radiating pain into her R lateral leg to her ankle.  She denies any numbness/tingling or weakness in her R LE.  She also reports low back pain.  Diagnostic imaging: L hip XR- 03/12/18 and R hip XR- 01/17/15   Pertinent review of systems: No fevers or chills  Relevant historical information: Heart failure, scoliosis   Exam:  BP 124/64 (BP Location: Right Arm, Patient Position: Sitting, Cuff Size: Normal)   Pulse 74   Ht 5' 3.5" (1.613 m)   Wt 133 lb 3.2 oz (60.4 kg)   SpO2 97%   BMI 23.23 kg/m  General: Well Developed, well nourished, and in no acute distress.   MSK:  L-spine: Slight curvature present right hip higher than left otherwise normal-appearing Decreased lumbar motion significantly especially to extension. Lower extremity strength grossly intact however hip weakness is noted below. Reflexes intact bilateral lower extremities as is sensation.  Right hip sits higher than left but otherwise is normal-appearing Mildly tender palpation greater trochanter. Hip abduction strength diminished 4/5. Antalgic gait.    Lab and Radiology Results  X-ray images L-spine obtained today personally and independently reviewed Significant spondylosis and DDD at L4-L5 versus possible fusion. Fusion at T12-L1. No acute fractures. Await formal radiology review    Assessment and Plan: 86 y.o. female with  Right lateral hip and right leg pain.  Pain is most consistent with L5 radiculopathy.  She certainly has considerable  degenerative changes on x-ray at L4-L5 that could predispose her to relieve radiculopathy.  Additionally some of her pain is likely from trochanteric bursitis/hip abductor tendinopathy.  Plan for course of prednisone gabapentin and referral to physical therapy.  Check back in about a month or sooner if needed.   PDMP not reviewed this encounter. Orders Placed This Encounter  Procedures  . DG Lumbar Spine 2-3 Views    Standing Status:   Future    Number of Occurrences:   1    Standing Expiration Date:   02/14/2021    Order Specific Question:   Reason for Exam (SYMPTOM  OR DIAGNOSIS REQUIRED)    Answer:   eval pain low back and rt L5 radiculopathy    Order Specific Question:   Preferred imaging location?    Answer:   [facility]    Order Specific Question:   Radiology Contrast Protocol - do NOT remove file path    Answer:   [email or web address]  . Ambulatory referral to Physical Therapy    Referral Priority:   Routine    Referral Type:   Physical Medicine    Referral Reason:   Specialty Services Required    Requested Specialty:   Physical Therapy   Meds ordered this encounter  Medications  . predniSONE (DELTASONE) 10 MG tablet    Sig: Take 3 tablets (30 mg total) by mouth daily with breakfast.    Dispense:  15 tablet    Refill:  0  . gabapentin (NEURONTIN) 100  MG capsule    Sig: Take 1-3 capsules (100-300 mg total) by mouth 3 (three) times daily as needed (nerve pain). Take mostly at night    Dispense:  30 capsule    Refill:  3     Discussed warning signs or symptoms. Please see discharge instructions. Patient expresses understanding.   The above documentation has been reviewed and is accurate and complete Lynne Leader, M.D.

## 2020-02-16 NOTE — Progress Notes (Signed)
X-ray shows worsening potential nerve pinched or narrowing at L3-4.  MRI would be very helpful for potential planning of next steps.  If not rapidly improving with the prednisone and gabapentin please let me know in about a week and I will order an MRI.

## 2020-02-18 ENCOUNTER — Other Ambulatory Visit: Payer: Self-pay | Admitting: Physical Therapy

## 2020-02-18 DIAGNOSIS — M542 Cervicalgia: Secondary | ICD-10-CM

## 2020-02-22 ENCOUNTER — Encounter: Payer: Self-pay | Admitting: Rehabilitative and Restorative Service Providers"

## 2020-02-22 ENCOUNTER — Other Ambulatory Visit: Payer: Self-pay

## 2020-02-22 ENCOUNTER — Ambulatory Visit (INDEPENDENT_AMBULATORY_CARE_PROVIDER_SITE_OTHER): Payer: Medicare Other | Admitting: Rehabilitative and Restorative Service Providers"

## 2020-02-22 DIAGNOSIS — M5441 Lumbago with sciatica, right side: Secondary | ICD-10-CM | POA: Diagnosis not present

## 2020-02-22 DIAGNOSIS — R262 Difficulty in walking, not elsewhere classified: Secondary | ICD-10-CM

## 2020-02-22 DIAGNOSIS — M542 Cervicalgia: Secondary | ICD-10-CM

## 2020-02-22 DIAGNOSIS — M25551 Pain in right hip: Secondary | ICD-10-CM

## 2020-02-22 DIAGNOSIS — M6281 Muscle weakness (generalized): Secondary | ICD-10-CM

## 2020-02-22 DIAGNOSIS — R293 Abnormal posture: Secondary | ICD-10-CM

## 2020-02-22 DIAGNOSIS — G8929 Other chronic pain: Secondary | ICD-10-CM

## 2020-02-22 NOTE — Patient Instructions (Signed)
Access Code: [email or web address] URL: [email or web address] Date: 02/22/2020 Prepared by: Scot Jun  Exercises Left Standing Lateral Shift Correction at [place] - 2 x daily - 7 x weekly - 1 sets - 10 reps Seated Long Arc Quad - 2 x daily - 7 x weekly - 3 sets - 10 reps - 2 hold Supine Lower Trunk Rotation - 2 x daily - 7 x weekly - 5 reps - 1 sets - 15 hold

## 2020-02-22 NOTE — Therapy (Signed)
[place], [place], [address] Phone: [phone]   Fax:  [phone]  Physical Therapy Evaluation  Patient Details  Name: Briana Fitzgerald MRN: [id number] Date of Birth: Aug 08, 1932 Referring Provider (PT): Dr. Lynne Leader   Encounter Date: 02/22/2020  PT End of Session - 02/22/20 1419    Visit Number  1    Number of Visits  16    Date for PT Re-Evaluation  03/21/20    Authorization Time Period  02/22/2020 - 01/17/85 (medicare cert/POC)    Authorization - Visit Number  1    Progress Note Due on Visit  10    PT Start Time  7619    PT Stop Time  1515    PT Time Calculation (min)  47 min    Activity Tolerance  Patient tolerated treatment well    Behavior During Therapy  [facility] for tasks assessed/performed       Past Medical History:  Diagnosis Date  . Arthritis   . BACK PAIN 07/18/2008  . Bowel obstruction ([place])   . CONSTIPATION 10/02/2010  . GERD 10/02/2010  . Headache   . IBS (irritable bowel syndrome)   . LAMINECTOMY, LUMBAR, HX OF 06/16/2008  . OSTEOARTHRITIS 10/02/2010  . Pneumonia   . RESTLESS LEG SYNDROME 10/02/2010  . SACROILIAC JOINT DYSFUNCTION 06/16/2008  . Sleep apnea   . STYE 10/02/2010  . TRANSIENT ISCHEMIC ATTACKS, HX OF 10/02/2010    Past Surgical History:  Procedure Laterality Date  . ABDOMINAL HYSTERECTOMY  1979   TAH, fibroids  . [place] / [place]  2005  . APPENDECTOMY  1941  . EYE SURGERY  2005   catarac,both eyes  . LIVER BIOPSY  1997   benign, hemangioma resection  . SPINE SURGERY  2006   laminectomy    There were no vitals filed for this visit.   Subjective Assessment - 02/22/20 1433    Subjective  Pt. indicated having problems with hips for " a long time" and history of back surgery.  Pt. stated insidious of symptoms on Rt lumbar and down Rt posterior/lateral hip for last 5-6 weeks.    Patient is accompained by:  Family member    Pertinent History  Med hx: previous lumbar surgery,  OA, IBS, multiple treatment locations    Limitations  Walking;Standing;Sitting    Patient Stated Goals  Reduce pain, perform gardening/yard    Currently in Pain?  Yes    Pain Score  5    at worst 7/10   Pain Location  Back   back/Rt hip/leg   Pain Orientation  Right    Pain Descriptors / Indicators  Aching;Cramping    Pain Type  Chronic pain    Pain Onset  More than a month ago    Pain Frequency  Constant    Aggravating Factors   Bending, sitting prolonged, prolonged walking    Pain Relieving Factors  Elevation of legs at times    Multiple Pain Sites  Yes    Pain Score  0   at worst 5/10   Pain Location  Neck    Pain Descriptors / Indicators  Tightness;Sore    Pain Type  Chronic pain    Pain Onset  More than a month ago    Pain Frequency  Intermittent    Aggravating Factors   Rotation movements         [facility] PT Assessment - 02/22/20 0001      Assessment   Medical  Diagnosis  Neck pain, lumbar radiculitis, Rt hip bursitis    Referring Provider (PT)  Dr. Lynne Leader    Onset Date/Surgical Date  01/15/20    Hand Dominance  Right      Precautions   Precautions  None      Restrictions   Weight Bearing Restrictions  No      Balance Screen   Has the patient fallen in the past 6 months  No    Is the patient reluctant to leave their home because of a fear of falling?   Yes      [place] residence    Type of [place] Access  Stairs to enter    Entrance Stairs-Number of Steps  3-4      Prior Function   Level of Independence  Independent      Cognition   Overall Cognitive Status  Within Functional Limits for tasks assessed      Sensation   Light Touch  Appears Intact      Posture/Postural Control   Posture Comments  Mild Lt lateral trunk shift in stance, mild forward trunk lean, reduced lumbar lordosis, increase thoracic kyphosis      ROM / Strength   AROM / PROM / Strength  Strength;PROM;AROM      AROM   AROM  Assessment Site  Cervical;Lumbar;Hip    Right/Left Hip  Left;Right    Cervical Flexion  43    Cervical Extension  59    Cervical - Right Rotation  55   Pain Rt Cervical   Cervical - Left Rotation  62   Pain Lt cervical   Lumbar Flexion  to mid shin no complaints    Lumbar Extension  <25% c Rt LE symptoms       PROM   PROM Assessment Site  Cervical;Hip    Right/Left Hip  Left;Right      Strength   Strength Assessment Site  Shoulder;Elbow;Hip;Ankle;Knee    Right/Left Shoulder  Right;Left    Right/Left Elbow  Left;Right    Right/Left Hip  Left;Right    Right Hip Flexion  5/5    Left Hip Flexion  5/5    Right/Left Knee  Left;Right    Right Knee Flexion  5/5    Right Knee Extension  5/5    Left Knee Flexion  5/5    Left Knee Extension  5/5    Right/Left Ankle  Left;Right    Right Ankle Dorsiflexion  5/5    Left Ankle Dorsiflexion  5/5      Palpation   Spinal mobility  Limitation throughout cervical and lumbar PAIVM assessment c pain on spinous processes    Palpation comment  Mild tenderness Rt lumbar paraspinals, QL.  Glute med/max TrP and tenderness surrounding greater trochanter      Special Tests    Special Tests  Cervical;Lumbar;Hip Special Tests    Cervical Tests  Spurling's;Dictraction    Lumbar Tests  Slump Test;FABER test;Straight Leg Raise      FABER test   Side  Right    Comment  Pain Rt hip during movement attempt      Slump test   Side  Right    Comment  + for tightness on Rt at full range                  Objective measurements completed on examination: See above findings.  [place] Adult PT Treatment/Exercise - 02/22/20 0001      Exercises   Exercises  Lumbar      Lumbar Exercises: Standing   Other Standing Lumbar Exercises  self lateral shift correction x 10      Lumbar Exercises: Seated   Other Seated Lumbar Exercises  sciatic nerve flossing LAQ c PF 3 x 10      Lumbar Exercises: Supine   Other Supine Lumbar Exercises  supine  LTR stretch 15 sec x 5      Manual Therapy   Manual therapy comments  Lt lateral shift correction x 15 c hold             PT Education - 02/22/20 1419    Education Details  HEP, POC    Person(s) Educated  Patient    Methods  Explanation;Demonstration;Verbal cues;Handout    Comprehension  Returned demonstration;Verbalized understanding          PT Long Term Goals - 02/22/20 1423      PT LONG TERM GOAL #1   Title  Patient will demonstrate/report pain at worst less than or equal to 2/10 to facilitate minimal limitation in daily activity secondary to pain symptoms.    Time  8    Period  Weeks    Status  New    Target Date  04/18/20      PT LONG TERM GOAL #2   Title  Patient will demonstrate independent use of home exercise program to facilitate ability to maintain/progress functional gains from skilled physical therapy services.    Time  8    Period  Weeks    Status  New    Target Date  04/18/20      PT LONG TERM GOAL #3   Title  Patient will demonstrate cervical AROM WFL s symptoms to facilitate daily activity including driving, self care at PLOF s limitation due to symptoms.    Time  8    Period  Weeks    Status  New    Target Date  04/18/20      PT LONG TERM GOAL #4   Title  Patient will demonstrate lumbar extension 50 % WFL s symptoms to facilitate upright standing, walking posture at PLOF s limitation.    Time  8    Period  Weeks    Status  New    Target Date  04/18/20      PT LONG TERM GOAL #5   Title  Pt. will demonstrate posture upright standing s deviation of lateral shift for normal standing/walking.    Time  8    Period  Weeks    Status  New    Target Date  04/18/20      Additional Long Term Goals   Additional Long Term Goals  Yes      PT LONG TERM GOAL #6   Title  Pt. will report/demonstrate ability to perform yard work s restriction.    Time  8    Period  Weeks    Status  New    Target Date  04/18/20             Plan - 02/22/20  1420    Clinical Impression Statement  Patient is a 86 y.o. female who comes to clinic with complaints of cervical pain with mobility deficits as well as low back pain c Rt LE/hip pain c mobility, strength deficits that impair her ability to perform usual daily and recreational functional activities  without increase difficulty/symptoms at this time.  Patient to benefit from skilled PT services to address impairments and limitations to improve to previous level of function without restriction secondary to condition.    Personal Factors and Comorbidities  Comorbidity 3+    Comorbidities  Med hx: previous lumbar surgery, OA, IBS, multiple treatment locations    Examination-Activity Limitations  Bed Mobility;Carry;Lift;Stand;Stairs;Squat;Sit;Transfers    Examination-Participation Restrictions  Yard Work;Community Activity    Stability/Clinical Decision Making  Evolving/Moderate complexity    Clinical Decision Making  Moderate    Rehab Potential  Good    PT Frequency  2x / week    PT Duration  8 weeks    PT Treatment/Interventions  ADLs/Self Care Home Management;Electrical Stimulation;Iontophoresis 4mg /ml Dexamethasone;Cryotherapy;Moist Heat;Traction;Balance training;Therapeutic exercise;Therapeutic activities;Functional mobility training;Stair training;Gait training;Ultrasound;Neuromuscular re-education;Patient/family education;Manual techniques;Taping;Dry needling;Passive range of motion;Spinal Manipulations;Joint Manipulations    PT Next Visit Plan  Lateral shift correction, lumbar mobility, cervical mobility intervention.    PT Home Exercise Plan  [email or web address]    Consulted and Agree with Plan of Care  Patient;Family member/caregiver    Family Member Consulted  husband       Patient will benefit from skilled therapeutic intervention in order to improve the following deficits and impairments:  Abnormal gait, Hypomobility, Pain, Decreased strength, Decreased activity tolerance, Decreased mobility,  Difficulty walking, Increased muscle spasms, Improper body mechanics, Postural dysfunction, Impaired flexibility, Decreased coordination, Decreased range of motion  Visit Diagnosis: Cervicalgia  Chronic bilateral low back pain with right-sided sciatica  Pain in right hip  Difficulty in walking, not elsewhere classified  Muscle weakness (generalized)  Abnormal posture     Problem List Patient Active Problem List   Diagnosis Date Noted  . Lumbar trigger point syndrome 09/23/2019  . Coronary artery calcification seen on CT scan 09/23/2019  . Aortic root dilatation ([place]) 09/23/2019  . Degenerative disc disease, cervical 08/20/2019  . Lymphocytosis 05/11/2018  . Diastolic dysfunction with heart failure ([place]) 05/06/2018  . Obstructive sleep apnea 04/13/2014  . History of migraine headaches 01/12/2013  . Hyperlipidemia 09/02/2011  . Osteoporosis 05/01/2011  . Nonallopathic lesion of sacral region 04/17/2011  . RESTLESS LEG SYNDROME 10/02/2010  . STYE 10/02/2010  . GERD 10/02/2010  . CONSTIPATION 10/02/2010  . Osteoarthritis 10/02/2010  . TRANSIENT ISCHEMIC ATTACKS, HX OF 10/02/2010  . BACK PAIN 07/18/2008  . Disorder of sacrum 06/16/2008  . UNEQUAL LEG LENGTH 06/16/2008  . SCOLIOSIS, MILD 06/16/2008  . LAMINECTOMY, LUMBAR, HX OF 06/16/2008   Scot Jun, PT, DPT, OCS, ATC 02/22/20  4:17 PM    [facility] [address] [place], [place], [address] Phone: [phone]   Fax:  [phone]  Name: Briana Fitzgerald MRN: [id number] Date of Birth: [birth year]

## 2020-02-28 ENCOUNTER — Encounter: Payer: Self-pay | Admitting: Physical Therapy

## 2020-02-28 ENCOUNTER — Other Ambulatory Visit: Payer: Self-pay

## 2020-02-28 ENCOUNTER — Ambulatory Visit (INDEPENDENT_AMBULATORY_CARE_PROVIDER_SITE_OTHER): Payer: Medicare Other | Admitting: Physical Therapy

## 2020-02-28 DIAGNOSIS — M25551 Pain in right hip: Secondary | ICD-10-CM

## 2020-02-28 DIAGNOSIS — R293 Abnormal posture: Secondary | ICD-10-CM

## 2020-02-28 DIAGNOSIS — R262 Difficulty in walking, not elsewhere classified: Secondary | ICD-10-CM | POA: Diagnosis not present

## 2020-02-28 DIAGNOSIS — M542 Cervicalgia: Secondary | ICD-10-CM

## 2020-02-28 DIAGNOSIS — M5441 Lumbago with sciatica, right side: Secondary | ICD-10-CM | POA: Diagnosis not present

## 2020-02-28 DIAGNOSIS — M6281 Muscle weakness (generalized): Secondary | ICD-10-CM

## 2020-02-28 DIAGNOSIS — G8929 Other chronic pain: Secondary | ICD-10-CM

## 2020-02-28 NOTE — Therapy (Signed)
[facility] [place], [place], [address] Phone: [phone]   Fax:  [phone]  Physical Therapy Treatment  Patient Details  Name: Briana Fitzgerald MRN: [id number] Date of Birth: 1932/06/27 Referring Provider (PT): Dr. Lynne Leader   Encounter Date: 02/28/2020   PT End of Session - 02/28/20 1351    Visit Number 2    Number of Visits 16    Date for PT Re-Evaluation 03/21/20    Authorization Time Period 02/22/2020 - 10/21/4268 (medicare cert/POC)    Progress Note Due on Visit 10    PT Start Time 6237    PT Stop Time 1428    PT Time Calculation (min) 43 min    Activity Tolerance Patient tolerated treatment well    Behavior During Therapy [facility] for tasks assessed/performed           Past Medical History:  Diagnosis Date  . Arthritis   . BACK PAIN 07/18/2008  . Bowel obstruction ([place])   . CONSTIPATION 10/02/2010  . GERD 10/02/2010  . Headache   . IBS (irritable bowel syndrome)   . LAMINECTOMY, LUMBAR, HX OF 06/16/2008  . OSTEOARTHRITIS 10/02/2010  . Pneumonia   . RESTLESS LEG SYNDROME 10/02/2010  . SACROILIAC JOINT DYSFUNCTION 06/16/2008  . Sleep apnea   . STYE 10/02/2010  . TRANSIENT ISCHEMIC ATTACKS, HX OF 10/02/2010    Past Surgical History:  Procedure Laterality Date  . ABDOMINAL HYSTERECTOMY  1979   TAH, fibroids  . [place] / [place]  2005  . APPENDECTOMY  1941  . EYE SURGERY  2005   catarac,both eyes  . LIVER BIOPSY  1997   benign, hemangioma resection  . SPINE SURGERY  2006   laminectomy    There were no vitals filed for this visit.   Subjective Assessment - 02/28/20 1350    Subjective Pt arriving to therapy reporting R hip pain which is extending down lateral and posterior thigh.    Pertinent History Med hx: previous lumbar surgery, OA, IBS, multiple treatment locations    Limitations Walking;Standing;Sitting    Patient Stated Goals Reduce pain, perform gardening/yard    Currently in Pain? Yes    Pain  Score 5     Pain Location Hip    Pain Orientation Right    Pain Descriptors / Indicators Aching;Sore    Pain Onset More than a month ago    Pain Frequency Constant                             [facility] Adult PT Treatment/Exercise - 02/28/20 0001      Exercises   Exercises Lumbar      Lumbar Exercises: Stretches   Active Hamstring Stretch Right;Left;2 reps;20 seconds    Single Knee to Chest Stretch Right;Left;2 reps;20 seconds    Piriformis Stretch Right;Left;2 reps;20 seconds    Other Lumbar Stretch Exercise trunk rotation x 5 holding 15 seconds each      Lumbar Exercises: Aerobic   Nustep L3 x 6 minutes      Lumbar Exercises: Standing   Other Standing Lumbar Exercises lateral shift correction x 5 reps      Lumbar Exercises: Seated   Other Seated Lumbar Exercises sciatic nerve flossing LAQ c PF  and DF 3 x 10      Lumbar Exercises: Supine   Other Supine Lumbar Exercises supine LTR stretch x 3 holding 15 seconds  PT Long Term Goals - 02/28/20 1416      PT LONG TERM GOAL #1   Title Patient will demonstrate/report pain at worst less than or equal to 2/10 to facilitate minimal limitation in daily activity secondary to pain symptoms.    Status On-going      PT LONG TERM GOAL #2   Title Patient will demonstrate independent use of home exercise program to facilitate ability to maintain/progress functional gains from skilled physical therapy services.    Status On-going      PT LONG TERM GOAL #3   Title Patient will demonstrate cervical AROM WFL s symptoms to facilitate daily activity including driving, self care at PLOF s limitation due to symptoms.    Status On-going      PT LONG TERM GOAL #4   Title Patient will demonstrate lumbar extension 50 % WFL s symptoms to facilitate upright standing, walking posture at PLOF s limitation.    Status On-going      PT LONG TERM GOAL #5   Title Pt. will demonstrate posture upright  standing s deviation of lateral shift for normal standing/walking.    Status On-going      PT LONG TERM GOAL #6   Title Pt. will report/demonstrate ability to perform yard work s restriction.    Status On-going                 Plan - 02/28/20 1408    Clinical Impression Statement Pt arriving to therapy reporting 5/10 pain in her left lateral/posterior hip. Focusing on lumbar stretching with core activation. We reviewed pt' HEP to correct exercise technique. Continue skilled PT to progress toward goals set.    Personal Factors and Comorbidities Comorbidity 3+    Comorbidities Med hx: previous lumbar surgery, OA, IBS, multiple treatment locations    Examination-Activity Limitations Bed Mobility;Carry;Lift;Stand;Stairs;Squat;Sit;Transfers    Stability/Clinical Decision Making Evolving/Moderate complexity    Rehab Potential Good    PT Frequency 2x / week    PT Duration 8 weeks    PT Treatment/Interventions ADLs/Self Care Home Management;Electrical Stimulation;Iontophoresis 4mg /ml Dexamethasone;Cryotherapy;Moist Heat;Traction;Balance training;Therapeutic exercise;Therapeutic activities;Functional mobility training;Stair training;Gait training;Ultrasound;Neuromuscular re-education;Patient/family education;Manual techniques;Taping;Dry needling;Passive range of motion;Spinal Manipulations;Joint Manipulations    PT Next Visit Plan Lateral shift correction, lumbar mobility, cervical mobility intervention.    PT Home Exercise Plan [id number]    Consulted and Agree with Plan of Care Patient;Family member/caregiver           Patient will benefit from skilled therapeutic intervention in order to improve the following deficits and impairments:  Abnormal gait, Hypomobility, Pain, Decreased strength, Decreased activity tolerance, Decreased mobility, Difficulty walking, Increased muscle spasms, Improper body mechanics, Postural dysfunction, Impaired flexibility, Decreased coordination, Decreased range  of motion  Visit Diagnosis: Chronic bilateral low back pain with right-sided sciatica  Pain in right hip  Cervicalgia  Difficulty in walking, not elsewhere classified  Muscle weakness (generalized)  Abnormal posture     Problem List Patient Active Problem List   Diagnosis Date Noted  . Lumbar trigger point syndrome 09/23/2019  . Coronary artery calcification seen on CT scan 09/23/2019  . Aortic root dilatation ([place]) 09/23/2019  . Degenerative disc disease, cervical 08/20/2019  . Lymphocytosis 05/11/2018  . Diastolic dysfunction with heart failure ([place]) 05/06/2018  . Obstructive sleep apnea 04/13/2014  . History of migraine headaches 01/12/2013  . Hyperlipidemia 09/02/2011  . Osteoporosis 05/01/2011  . Nonallopathic lesion of sacral region 04/17/2011  . RESTLESS LEG SYNDROME 10/02/2010  . STYE 10/02/2010  .  GERD 10/02/2010  . CONSTIPATION 10/02/2010  . Osteoarthritis 10/02/2010  . TRANSIENT ISCHEMIC ATTACKS, HX OF 10/02/2010  . BACK PAIN 07/18/2008  . Disorder of sacrum 06/16/2008  . UNEQUAL LEG LENGTH 06/16/2008  . SCOLIOSIS, MILD 06/16/2008  . LAMINECTOMY, LUMBAR, HX OF 06/16/2008    Oretha Caprice, PT, MPT 02/28/2020, 2:31 PM  [facility] [address] [place], [place], [address] Phone: [phone]   Fax:  [phone]  Name: Briana Fitzgerald MRN: [id number] Date of Birth: [birth year]

## 2020-03-01 ENCOUNTER — Encounter: Payer: Medicare Other | Admitting: Rehabilitative and Restorative Service Providers"

## 2020-03-04 ENCOUNTER — Other Ambulatory Visit: Payer: Self-pay | Admitting: Cardiology

## 2020-03-06 ENCOUNTER — Other Ambulatory Visit: Payer: Self-pay

## 2020-03-06 ENCOUNTER — Ambulatory Visit (INDEPENDENT_AMBULATORY_CARE_PROVIDER_SITE_OTHER): Payer: Medicare Other | Admitting: Rehabilitative and Restorative Service Providers"

## 2020-03-06 ENCOUNTER — Encounter: Payer: Self-pay | Admitting: Rehabilitative and Restorative Service Providers"

## 2020-03-06 DIAGNOSIS — M5441 Lumbago with sciatica, right side: Secondary | ICD-10-CM | POA: Diagnosis not present

## 2020-03-06 DIAGNOSIS — M25551 Pain in right hip: Secondary | ICD-10-CM | POA: Diagnosis not present

## 2020-03-06 DIAGNOSIS — R293 Abnormal posture: Secondary | ICD-10-CM

## 2020-03-06 DIAGNOSIS — M6281 Muscle weakness (generalized): Secondary | ICD-10-CM | POA: Diagnosis not present

## 2020-03-06 DIAGNOSIS — G8929 Other chronic pain: Secondary | ICD-10-CM

## 2020-03-06 DIAGNOSIS — R262 Difficulty in walking, not elsewhere classified: Secondary | ICD-10-CM | POA: Diagnosis not present

## 2020-03-06 NOTE — Therapy (Signed)
[place], [place], [address] Phone: [phone]   Fax:  [phone]  Physical Therapy Treatment  Patient Details  Name: Briana Fitzgerald MRN: [id number] Date of Birth: 1931/12/28 Referring Provider (PT): Dr. Lynne Leader   Encounter Date: 03/06/2020   PT End of Session - 03/06/20 1203    Visit Number 3    Number of Visits 16    Date for PT Re-Evaluation 04/18/20    Authorization Time Period 02/22/2020 - 02/21/3418 (medicare cert/POC)    Authorization - Visit Number 3    Progress Note Due on Visit 10    PT Start Time 1147    PT Stop Time 1226    PT Time Calculation (min) 39 min    Activity Tolerance Patient tolerated treatment well    Behavior During Therapy [facility] for tasks assessed/performed           Past Medical History:  Diagnosis Date  . Arthritis   . BACK PAIN 07/18/2008  . Bowel obstruction ([place])   . CONSTIPATION 10/02/2010  . GERD 10/02/2010  . Headache   . IBS (irritable bowel syndrome)   . LAMINECTOMY, LUMBAR, HX OF 06/16/2008  . OSTEOARTHRITIS 10/02/2010  . Pneumonia   . RESTLESS LEG SYNDROME 10/02/2010  . SACROILIAC JOINT DYSFUNCTION 06/16/2008  . Sleep apnea   . STYE 10/02/2010  . TRANSIENT ISCHEMIC ATTACKS, HX OF 10/02/2010    Past Surgical History:  Procedure Laterality Date  . ABDOMINAL HYSTERECTOMY  1979   TAH, fibroids  . [place] / [place]  2005  . APPENDECTOMY  1941  . EYE SURGERY  2005   catarac,both eyes  . LIVER BIOPSY  1997   benign, hemangioma resection  . SPINE SURGERY  2006   laminectomy    There were no vitals filed for this visit.   Subjective Assessment - 03/06/20 1152    Subjective Pt. stated a slight pain in Rt thigh/hip to just below knee at 4/10.  Pt. stated feeling some improvement overall.    Pertinent History Med hx: previous lumbar surgery, OA, IBS, multiple treatment locations    Limitations Walking;Standing;Sitting    Patient Stated Goals Reduce pain, perform  gardening/yard    Currently in Pain? Yes    Pain Score 4     Pain Location Hip   Rt thigh   Pain Orientation Right    Pain Descriptors / Indicators Aching;Sore    Pain Onset More than a month ago    Pain Frequency Intermittent    Aggravating Factors  putting leg out straight when lying down, walking at times, a few stretches at times    Effect of Pain on Daily Activities Difficulty lying down, walking    Pain Score 0    Pain Location Neck    Pain Descriptors / Indicators Tightness    Pain Type Chronic pain    Pain Onset More than a month ago    Pain Frequency Intermittent    Aggravating Factors  head turns                             [facility] Adult PT Treatment/Exercise - 03/06/20 0001      Lumbar Exercises: Stretches   Piriformis Stretch Right;Left;2 reps;20 seconds      Lumbar Exercises: Aerobic   Nustep Lvl 5 6 min      Lumbar Exercises: Supine   Bridge 20 reps    Other Supine Lumbar  Exercises supine LTR stretch x 3 holding 15 seconds    Other Supine Lumbar Exercises supine sciatic nerve flossing Rt LE x 15      Manual Therapy   Manual therapy comments Lt lateral shift correction x 15 c hold, supine cervical downslope mobs g2-g3 bilateral mid and lower cervical joints                       PT Long Term Goals - 02/28/20 1416      PT LONG TERM GOAL #1   Title Patient will demonstrate/report pain at worst less than or equal to 2/10 to facilitate minimal limitation in daily activity secondary to pain symptoms.    Status On-going      PT LONG TERM GOAL #2   Title Patient will demonstrate independent use of home exercise program to facilitate ability to maintain/progress functional gains from skilled physical therapy services.    Status On-going      PT LONG TERM GOAL #3   Title Patient will demonstrate cervical AROM WFL s symptoms to facilitate daily activity including driving, self care at PLOF s limitation due to symptoms.    Status  On-going      PT LONG TERM GOAL #4   Title Patient will demonstrate lumbar extension 50 % WFL s symptoms to facilitate upright standing, walking posture at PLOF s limitation.    Status On-going      PT LONG TERM GOAL #5   Title Pt. will demonstrate posture upright standing s deviation of lateral shift for normal standing/walking.    Status On-going      PT LONG TERM GOAL #6   Title Pt. will report/demonstrate ability to perform yard work s restriction.    Status On-going                 Plan - 03/06/20 1207    Clinical Impression Statement Pt. continued to present c standing posture c increased forward trunk lean, latearl shift to Lt present despite reports of noticing some improvement in symptoms overall.  Cues for HEP adjustment indicated at this time to improve performance and reduce reactive pain response to stretching.  Continued skilled PT indicated to progress towards goals and reduced pain.    Personal Factors and Comorbidities Comorbidity 3+    Comorbidities Med hx: previous lumbar surgery, OA, IBS, multiple treatment locations    Examination-Activity Limitations Bed Mobility;Carry;Lift;Stand;Stairs;Squat;Sit;Transfers    Stability/Clinical Decision Making Evolving/Moderate complexity    Rehab Potential Good    PT Frequency 2x / week    PT Duration 8 weeks    PT Treatment/Interventions ADLs/Self Care Home Management;Electrical Stimulation;Iontophoresis 4mg /ml Dexamethasone;Cryotherapy;Moist Heat;Traction;Balance training;Therapeutic exercise;Therapeutic activities;Functional mobility training;Stair training;Gait training;Ultrasound;Neuromuscular re-education;Patient/family education;Manual techniques;Taping;Dry needling;Passive range of motion;Spinal Manipulations;Joint Manipulations    PT Next Visit Plan Lateral shift correction and lumbar/cervical mobility    PT Home Exercise Plan [id number]    Consulted and Agree with Plan of Care Patient;Family member/caregiver            Patient will benefit from skilled therapeutic intervention in order to improve the following deficits and impairments:  Abnormal gait, Hypomobility, Pain, Decreased strength, Decreased activity tolerance, Decreased mobility, Difficulty walking, Increased muscle spasms, Improper body mechanics, Postural dysfunction, Impaired flexibility, Decreased coordination, Decreased range of motion  Visit Diagnosis: Chronic bilateral low back pain with right-sided sciatica  Pain in right hip  Difficulty in walking, not elsewhere classified  Muscle weakness (generalized)  Abnormal posture     Problem  List Patient Active Problem List   Diagnosis Date Noted  . Lumbar trigger point syndrome 09/23/2019  . Coronary artery calcification seen on CT scan 09/23/2019  . Aortic root dilatation ([place]) 09/23/2019  . Degenerative disc disease, cervical 08/20/2019  . Lymphocytosis 05/11/2018  . Diastolic dysfunction with heart failure ([place]) 05/06/2018  . Obstructive sleep apnea 04/13/2014  . History of migraine headaches 01/12/2013  . Hyperlipidemia 09/02/2011  . Osteoporosis 05/01/2011  . Nonallopathic lesion of sacral region 04/17/2011  . RESTLESS LEG SYNDROME 10/02/2010  . STYE 10/02/2010  . GERD 10/02/2010  . CONSTIPATION 10/02/2010  . Osteoarthritis 10/02/2010  . TRANSIENT ISCHEMIC ATTACKS, HX OF 10/02/2010  . BACK PAIN 07/18/2008  . Disorder of sacrum 06/16/2008  . UNEQUAL LEG LENGTH 06/16/2008  . SCOLIOSIS, MILD 06/16/2008  . LAMINECTOMY, LUMBAR, HX OF 06/16/2008   Scot Jun, PT, DPT, OCS, ATC 03/06/20  12:28 PM    [facility] [address] [place], [place], [address] Phone: [phone]   Fax:  [phone]  Name: Briana Fitzgerald MRN: [id number] Date of Birth: [birth year]

## 2020-03-08 ENCOUNTER — Other Ambulatory Visit: Payer: Self-pay | Admitting: Family Medicine

## 2020-03-09 ENCOUNTER — Encounter: Payer: Medicare Other | Admitting: Rehabilitative and Restorative Service Providers"

## 2020-03-14 ENCOUNTER — Encounter: Payer: Self-pay | Admitting: Rehabilitative and Restorative Service Providers"

## 2020-03-14 ENCOUNTER — Other Ambulatory Visit: Payer: Self-pay

## 2020-03-14 ENCOUNTER — Ambulatory Visit (INDEPENDENT_AMBULATORY_CARE_PROVIDER_SITE_OTHER): Payer: Medicare Other | Admitting: Rehabilitative and Restorative Service Providers"

## 2020-03-14 DIAGNOSIS — M25551 Pain in right hip: Secondary | ICD-10-CM | POA: Diagnosis not present

## 2020-03-14 DIAGNOSIS — M6281 Muscle weakness (generalized): Secondary | ICD-10-CM | POA: Diagnosis not present

## 2020-03-14 DIAGNOSIS — M5441 Lumbago with sciatica, right side: Secondary | ICD-10-CM

## 2020-03-14 DIAGNOSIS — G8929 Other chronic pain: Secondary | ICD-10-CM

## 2020-03-14 DIAGNOSIS — R262 Difficulty in walking, not elsewhere classified: Secondary | ICD-10-CM

## 2020-03-14 DIAGNOSIS — M542 Cervicalgia: Secondary | ICD-10-CM

## 2020-03-14 DIAGNOSIS — R293 Abnormal posture: Secondary | ICD-10-CM

## 2020-03-14 NOTE — Therapy (Signed)
[place], [place], [address] Phone: [phone]   Fax:  [phone]  Physical Therapy Treatment  Patient Details  Name: Briana Fitzgerald MRN: [id number] Date of Birth: 1931/12/27 Referring Provider (PT): Dr. Lynne Leader   Encounter Date: 03/14/2020   PT End of Session - 03/14/20 1300    Visit Number 4    Number of Visits 16    Date for PT Re-Evaluation 04/18/20    Authorization Time Period 02/22/2020 - 02/15/6072 (medicare cert/POC)    Authorization - Visit Number 4    Progress Note Due on Visit 10    PT Start Time 1300    PT Stop Time 1340    PT Time Calculation (min) 40 min    Activity Tolerance Patient tolerated treatment well    Behavior During Therapy [facility] for tasks assessed/performed           Past Medical History:  Diagnosis Date  . Arthritis   . BACK PAIN 07/18/2008  . Bowel obstruction ([place])   . CONSTIPATION 10/02/2010  . GERD 10/02/2010  . Headache   . IBS (irritable bowel syndrome)   . LAMINECTOMY, LUMBAR, HX OF 06/16/2008  . OSTEOARTHRITIS 10/02/2010  . Pneumonia   . RESTLESS LEG SYNDROME 10/02/2010  . SACROILIAC JOINT DYSFUNCTION 06/16/2008  . Sleep apnea   . STYE 10/02/2010  . TRANSIENT ISCHEMIC ATTACKS, HX OF 10/02/2010    Past Surgical History:  Procedure Laterality Date  . ABDOMINAL HYSTERECTOMY  1979   TAH, fibroids  . [place] / [place]  2005  . APPENDECTOMY  1941  . EYE SURGERY  2005   catarac,both eyes  . LIVER BIOPSY  1997   benign, hemangioma resection  . SPINE SURGERY  2006   laminectomy    There were no vitals filed for this visit.   Subjective Assessment - 03/14/20 1303    Subjective Pt. stated having some complaints in Lt superior shoulder/neck with an exercise at home.    Pertinent History Med hx: previous lumbar surgery, OA, IBS, multiple treatment locations    Limitations Walking;Standing;Sitting    Patient Stated Goals Reduce pain, perform gardening/yard     Currently in Pain? Yes    Pain Score 5     Pain Orientation Right    Pain Descriptors / Indicators Aching;Sore    Pain Radiating Towards Rt leg    Pain Onset More than a month ago    Pain Frequency Intermittent    Aggravating Factors  nighttime    Effect of Pain on Daily Activities Lying down and walking    Pain Score 6    Pain Location Neck    Pain Orientation Left;Right    Pain Descriptors / Indicators Aching    Pain Type Chronic pain    Pain Onset More than a month ago    Pain Frequency Intermittent    Aggravating Factors  head turns, sleeping at times.                             [facility] Adult PT Treatment/Exercise - 03/14/20 0001      Lumbar Exercises: Stretches   Piriformis Stretch 5 reps;20 seconds;Left;Right      Lumbar Exercises: Aerobic   Nustep lvl 5 5 mins      Lumbar Exercises: Supine   Bridge 20 reps    Other Supine Lumbar Exercises supine cervical rotation x 10      Manual Therapy  Manual therapy comments cervical downslope mobs g3-g4 mid and lower cervical bilateral, compress pin and stretch Lt upper trap, Rt LE distraction, lateral, inferior g3 hip jt mobs                       PT Long Term Goals - 02/28/20 1416      PT LONG TERM GOAL #1   Title Patient will demonstrate/report pain at worst less than or equal to 2/10 to facilitate minimal limitation in daily activity secondary to pain symptoms.    Status On-going      PT LONG TERM GOAL #2   Title Patient will demonstrate independent use of home exercise program to facilitate ability to maintain/progress functional gains from skilled physical therapy services.    Status On-going      PT LONG TERM GOAL #3   Title Patient will demonstrate cervical AROM WFL s symptoms to facilitate daily activity including driving, self care at PLOF s limitation due to symptoms.    Status On-going      PT LONG TERM GOAL #4   Title Patient will demonstrate lumbar extension 50 % WFL s  symptoms to facilitate upright standing, walking posture at PLOF s limitation.    Status On-going      PT LONG TERM GOAL #5   Title Pt. will demonstrate posture upright standing s deviation of lateral shift for normal standing/walking.    Status On-going      PT LONG TERM GOAL #6   Title Pt. will report/demonstrate ability to perform yard work s restriction.    Status On-going                 Plan - 03/14/20 1329    Clinical Impression Statement Pt. demonstrated improvement in ability to put Rt LE outstretched on table as well as improvement in Rt hip mobility after manual intervention c improvement in symptoms noted.   Adjusted HEP to ensure improved mechanics of intervention.  Pt. to benefit from cervicothoracic mobility gains as well as Rt hip mobility gains (still limited in flexion, extension, IR primarily).    Personal Factors and Comorbidities Comorbidity 3+    Comorbidities Med hx: previous lumbar surgery, OA, IBS, multiple treatment locations    Examination-Activity Limitations Bed Mobility;Carry;Lift;Stand;Stairs;Squat;Sit;Transfers    Stability/Clinical Decision Making Evolving/Moderate complexity    Rehab Potential Good    PT Frequency 2x / week    PT Duration 8 weeks    PT Treatment/Interventions ADLs/Self Care Home Management;Electrical Stimulation;Iontophoresis 4mg /ml Dexamethasone;Cryotherapy;Moist Heat;Traction;Balance training;Therapeutic exercise;Therapeutic activities;Functional mobility training;Stair training;Gait training;Ultrasound;Neuromuscular re-education;Patient/family education;Manual techniques;Taping;Dry needling;Passive range of motion;Spinal Manipulations;Joint Manipulations    PT Next Visit Plan Lateral shift reduction, cervical mobility gains, Rt hip jt mobility reassessment/treatment.    PT Home Exercise Plan [id number]    Consulted and Agree with Plan of Care Patient;Family member/caregiver           Patient will benefit from skilled  therapeutic intervention in order to improve the following deficits and impairments:  Abnormal gait, Hypomobility, Pain, Decreased strength, Decreased activity tolerance, Decreased mobility, Difficulty walking, Increased muscle spasms, Improper body mechanics, Postural dysfunction, Impaired flexibility, Decreased coordination, Decreased range of motion  Visit Diagnosis: Chronic bilateral low back pain with right-sided sciatica  Pain in right hip  Difficulty in walking, not elsewhere classified  Muscle weakness (generalized)  Abnormal posture  Cervicalgia     Problem List Patient Active Problem List   Diagnosis Date Noted  . Lumbar trigger point syndrome 09/23/2019  .  Coronary artery calcification seen on CT scan 09/23/2019  . Aortic root dilatation ([place]) 09/23/2019  . Degenerative disc disease, cervical 08/20/2019  . Lymphocytosis 05/11/2018  . Diastolic dysfunction with heart failure ([place]) 05/06/2018  . Obstructive sleep apnea 04/13/2014  . History of migraine headaches 01/12/2013  . Hyperlipidemia 09/02/2011  . Osteoporosis 05/01/2011  . Nonallopathic lesion of sacral region 04/17/2011  . RESTLESS LEG SYNDROME 10/02/2010  . STYE 10/02/2010  . GERD 10/02/2010  . CONSTIPATION 10/02/2010  . Osteoarthritis 10/02/2010  . TRANSIENT ISCHEMIC ATTACKS, HX OF 10/02/2010  . BACK PAIN 07/18/2008  . Disorder of sacrum 06/16/2008  . UNEQUAL LEG LENGTH 06/16/2008  . SCOLIOSIS, MILD 06/16/2008  . LAMINECTOMY, LUMBAR, HX OF 06/16/2008   Scot Jun, PT, DPT, OCS, ATC 03/14/20  1:35 PM    [facility] [address] [place], [place], [address] Phone: [phone]   Fax:  [phone]  Name: Briana Fitzgerald MRN: [id number] Date of Birth: 04-01-1932

## 2020-03-16 ENCOUNTER — Encounter: Payer: Medicare Other | Admitting: Rehabilitative and Restorative Service Providers"

## 2020-03-21 ENCOUNTER — Ambulatory Visit (INDEPENDENT_AMBULATORY_CARE_PROVIDER_SITE_OTHER): Payer: Medicare Other | Admitting: Rehabilitative and Restorative Service Providers"

## 2020-03-21 ENCOUNTER — Other Ambulatory Visit: Payer: Self-pay

## 2020-03-21 ENCOUNTER — Encounter: Payer: Self-pay | Admitting: Rehabilitative and Restorative Service Providers"

## 2020-03-21 DIAGNOSIS — R262 Difficulty in walking, not elsewhere classified: Secondary | ICD-10-CM | POA: Diagnosis not present

## 2020-03-21 DIAGNOSIS — R293 Abnormal posture: Secondary | ICD-10-CM | POA: Diagnosis not present

## 2020-03-21 DIAGNOSIS — M5441 Lumbago with sciatica, right side: Secondary | ICD-10-CM | POA: Diagnosis not present

## 2020-03-21 DIAGNOSIS — M542 Cervicalgia: Secondary | ICD-10-CM

## 2020-03-21 DIAGNOSIS — G8929 Other chronic pain: Secondary | ICD-10-CM

## 2020-03-21 DIAGNOSIS — M6281 Muscle weakness (generalized): Secondary | ICD-10-CM

## 2020-03-21 DIAGNOSIS — M25551 Pain in right hip: Secondary | ICD-10-CM | POA: Diagnosis not present

## 2020-03-21 NOTE — Therapy (Addendum)
[place], [place], [address] Phone: [phone]   Fax:  [phone]  Physical Therapy Treatment/Discharge  Patient Details  Name: Briana Fitzgerald MRN: [id number] Date of Birth: 1932/07/08 Referring Provider (PT): Dr. Lynne Leader   Encounter Date: 03/21/2020   PT End of Session - 03/21/20 1145    Visit Number 5    Number of Visits 16    Date for PT Re-Evaluation 04/18/20    Authorization Time Period 02/22/2020 - 10/18/1939 (medicare cert/POC)    Authorization - Visit Number 5    Progress Note Due on Visit 10    PT Start Time 7408    PT Stop Time 1225    PT Time Calculation (min) 40 min    Activity Tolerance Patient tolerated treatment well    Behavior During Therapy [facility] for tasks assessed/performed           Past Medical History:  Diagnosis Date  . Arthritis   . BACK PAIN 07/18/2008  . Bowel obstruction ([place])   . CONSTIPATION 10/02/2010  . GERD 10/02/2010  . Headache   . IBS (irritable bowel syndrome)   . LAMINECTOMY, LUMBAR, HX OF 06/16/2008  . OSTEOARTHRITIS 10/02/2010  . Pneumonia   . RESTLESS LEG SYNDROME 10/02/2010  . SACROILIAC JOINT DYSFUNCTION 06/16/2008  . Sleep apnea   . STYE 10/02/2010  . TRANSIENT ISCHEMIC ATTACKS, HX OF 10/02/2010    Past Surgical History:  Procedure Laterality Date  . ABDOMINAL HYSTERECTOMY  1979   TAH, fibroids  . [place] / [place]  2005  . APPENDECTOMY  1941  . EYE SURGERY  2005   catarac,both eyes  . LIVER BIOPSY  1997   benign, hemangioma resection  . SPINE SURGERY  2006   laminectomy    There were no vitals filed for this visit.   Subjective Assessment - 03/21/20 1226    Subjective Pt. stated feeling some less complaints in leg compared to previous.  Reported both neck and back at 5/10 with neck being more tightness than pain.    Pertinent History Med hx: previous lumbar surgery, OA, IBS, multiple treatment locations    Limitations Walking;Standing;Sitting     Patient Stated Goals Reduce pain, perform gardening/yard    Currently in Pain? Yes    Pain Score 5     Pain Location Leg    Pain Orientation Right    Pain Descriptors / Indicators Aching;Sore    Pain Type Chronic pain    Pain Onset More than a month ago    Pain Frequency Intermittent    Aggravating Factors  unsure reported    Pain Relieving Factors Some of treatment has helped    Effect of Pain on Daily Activities Lying with leg out    Pain Score 5    Pain Location Neck    Pain Orientation Left    Pain Descriptors / Indicators Tightness    Pain Type Chronic pain    Pain Onset More than a month ago    Pain Frequency Intermittent    Aggravating Factors  turning head    Pain Relieving Factors some intervention at clinic                             [facility] Adult PT Treatment/Exercise - 03/21/20 0001      Lumbar Exercises: Aerobic   Nustep lvl 5 6 mins      Lumbar Exercises: Standing   Other  Standing Lumbar Exercises lateral shift correction at wall x 15 c cues      Lumbar Exercises: Prone   Other Prone Lumbar Exercises prone press up on elbows 2 x 10 c 5 second hold      Manual Therapy   Manual therapy comments active compression to Lt upper trap, g2-g3 cPA L2-L5, Rt LE long axis distraction, g3 inferior/lateral Rt hip jt mobs                  PT Education - 03/21/20 1228    Education Details additional prone press up exercise for HEP    Person(s) Educated Patient    Methods Explanation;Demonstration;Verbal cues;Handout    Comprehension Verbal cues required;Returned demonstration;Verbalized understanding               PT Long Term Goals - 03/21/20 1211      PT LONG TERM GOAL #1   Title Patient will demonstrate/report pain at worst less than or equal to 2/10 to facilitate minimal limitation in daily activity secondary to pain symptoms.    Status On-going    Target Date 04/18/20      PT LONG TERM GOAL #2   Title Patient will demonstrate  independent use of home exercise program to facilitate ability to maintain/progress functional gains from skilled physical therapy services.    Status On-going    Target Date 04/18/20      PT LONG TERM GOAL #3   Title Patient will demonstrate cervical AROM WFL s symptoms to facilitate daily activity including driving, self care at PLOF s limitation due to symptoms.    Status On-going    Target Date 04/18/20      PT LONG TERM GOAL #4   Title Patient will demonstrate lumbar extension 50 % WFL s symptoms to facilitate upright standing, walking posture at PLOF s limitation.    Status On-going    Target Date 04/18/20      PT LONG TERM GOAL #5   Title Pt. will demonstrate posture upright standing s deviation of lateral shift for normal standing/walking.    Status On-going    Target Date 04/18/20      PT LONG TERM GOAL #6   Title Pt. will report/demonstrate ability to perform yard work s restriction.    Status On-going    Target Date 04/18/20                 Plan - 03/21/20 1217    Clinical Impression Statement Lateral shift deviation continued to be present and notable in standing posture.  Reviewed HEP to ensure proper techniques as well as frequency for home use.  Presentation today indicated prone lying c no lateral shift present and possible Rt LE centralization noted c prone on elbow press ups.  Added for HEP but Pt. has difficulty c positional placement of UE.    Personal Factors and Comorbidities Comorbidity 3+    Comorbidities Med hx: previous lumbar surgery, OA, IBS, multiple treatment locations    Examination-Activity Limitations Bed Mobility;Carry;Lift;Stand;Stairs;Squat;Sit;Transfers    Stability/Clinical Decision Making Evolving/Moderate complexity    Rehab Potential Good    PT Frequency 2x / week    PT Duration 8 weeks    PT Treatment/Interventions ADLs/Self Care Home Management;Electrical Stimulation;Iontophoresis 18m/ml Dexamethasone;Cryotherapy;Moist  Heat;Traction;Balance training;Therapeutic exercise;Therapeutic activities;Functional mobility training;Stair training;Gait training;Ultrasound;Neuromuscular re-education;Patient/family education;Manual techniques;Taping;Dry needling;Passive range of motion;Spinal Manipulations;Joint Manipulations    PT Next Visit Plan Continue to try to reduce lateral shift correction, encourage extension based repeated movements  PT Home Exercise Plan [phone]    Consulted and Agree with Plan of Care Patient           Patient will benefit from skilled therapeutic intervention in order to improve the following deficits and impairments:  Abnormal gait, Hypomobility, Pain, Decreased strength, Decreased activity tolerance, Decreased mobility, Difficulty walking, Increased muscle spasms, Improper body mechanics, Postural dysfunction, Impaired flexibility, Decreased coordination, Decreased range of motion  Visit Diagnosis: Chronic bilateral low back pain with right-sided sciatica  Pain in right hip  Difficulty in walking, not elsewhere classified  Muscle weakness (generalized)  Abnormal posture  Cervicalgia     Problem List Patient Active Problem List   Diagnosis Date Noted  . Lumbar trigger point syndrome 09/23/2019  . Coronary artery calcification seen on CT scan 09/23/2019  . Aortic root dilatation ([place]) 09/23/2019  . Degenerative disc disease, cervical 08/20/2019  . Lymphocytosis 05/11/2018  . Diastolic dysfunction with heart failure ([place]) 05/06/2018  . Obstructive sleep apnea 04/13/2014  . History of migraine headaches 01/12/2013  . Hyperlipidemia 09/02/2011  . Osteoporosis 05/01/2011  . Nonallopathic lesion of sacral region 04/17/2011  . RESTLESS LEG SYNDROME 10/02/2010  . STYE 10/02/2010  . GERD 10/02/2010  . CONSTIPATION 10/02/2010  . Osteoarthritis 10/02/2010  . TRANSIENT ISCHEMIC ATTACKS, HX OF 10/02/2010  . BACK PAIN 07/18/2008  . Disorder of sacrum 06/16/2008  . UNEQUAL LEG  LENGTH 06/16/2008  . SCOLIOSIS, MILD 06/16/2008  . LAMINECTOMY, LUMBAR, HX OF 06/16/2008   Scot Jun, PT, DPT, OCS, ATC 03/21/20  12:28 PM  PHYSICAL THERAPY DISCHARGE SUMMARY  Visits from Start of Care: 5  Current functional level related to goals / functional outcomes: See note   Remaining deficits: See note   Education / Equipment: HEP Plan: Patient agrees to discharge.  Patient goals were not met. Patient is being discharged due to not returning since the last visit.  ?????    Scot Jun, PT, DPT, OCS, ATC 04/20/20  11:34 AM     [facility] Physical Therapy [address] [place], [place], [address] Phone: [phone]   Fax:  [phone]  Name: Briana Fitzgerald MRN: [id number] Date of Birth: Feb 24, 1932

## 2020-03-23 ENCOUNTER — Encounter: Payer: Medicare Other | Admitting: Rehabilitative and Restorative Service Providers"

## 2020-03-27 ENCOUNTER — Telehealth: Payer: Self-pay | Admitting: Family Medicine

## 2020-03-27 NOTE — Telephone Encounter (Signed)
Patient called stating that she is having terrible leg pain and is concerned about continuing PT. Appointment scheduled to see Dr Georgina Snell tomorrow to discuss.

## 2020-03-28 ENCOUNTER — Ambulatory Visit (INDEPENDENT_AMBULATORY_CARE_PROVIDER_SITE_OTHER): Payer: Medicare Other | Admitting: Family Medicine

## 2020-03-28 ENCOUNTER — Encounter: Payer: Medicare Other | Admitting: Rehabilitative and Restorative Service Providers"

## 2020-03-28 ENCOUNTER — Other Ambulatory Visit: Payer: Self-pay

## 2020-03-28 ENCOUNTER — Encounter: Payer: Self-pay | Admitting: Family Medicine

## 2020-03-28 VITALS — BP 120/70 | HR 72 | Ht 63.5 in | Wt 134.0 lb

## 2020-03-28 DIAGNOSIS — M7061 Trochanteric bursitis, right hip: Secondary | ICD-10-CM | POA: Diagnosis not present

## 2020-03-28 DIAGNOSIS — G8929 Other chronic pain: Secondary | ICD-10-CM

## 2020-03-28 DIAGNOSIS — M5416 Radiculopathy, lumbar region: Secondary | ICD-10-CM | POA: Diagnosis not present

## 2020-03-28 DIAGNOSIS — M545 Low back pain, unspecified: Secondary | ICD-10-CM

## 2020-03-28 MED ORDER — PREDNISONE 10 MG PO TABS: 30.0000 mg | ORAL_TABLET | Freq: Every day | ORAL | 0 refills | Status: DC

## 2020-03-28 NOTE — Progress Notes (Signed)
Briana Fitzgerald, am serving as a Education administrator for Dr. Lynne Leader.  Briana Fitzgerald is a 86 y.o. female who presents to [place] at [facility] today for leg pain. Patient was last seen by Dr. Georgina Snell on 02/15/20 for R hip pain and stated  pain that flared up about 4-5 weeks w/ no known cause.  She locates her pain to her R lateral hip w/ radiating pain into her R lateral leg to her ankle.  She denies any numbness/tingling or weakness in her R LE.  She also reports low back pain. Patient was given gabapentin and prednisone and referred to PT.   Since last visit patient has had 5 sessions of PT and states PT had helped in the beginning but the last two appointments they had her do new stretches that seemed to do more harm than help. Gabapentin helps some but cannot take it too often because it makes her very tired and off balance. Prednisone did help.    Pertinent review of systems: No fevers or chills  Relevant historical information: Heart disease and scoliosis   Exam:  BP 120/70 (BP Location: Left Arm, Patient Position: Sitting, Cuff Size: Normal)   Pulse 72   Ht 5' 3.5" (1.613 m)   Wt 134 lb (60.8 kg)   SpO2 98%   BMI 23.36 kg/m  General: Well Developed, well nourished, and in no acute distress.   MSK: L-spine nontender.  Positive right-sided straight leg raise test and slump test. Antalgic gait   Lab and Radiology Results DG Lumbar Spine 2-3 Views  Result Date: 02/15/2020 CLINICAL DATA:  Low back pain.  Right L5 radiculopathy. EXAM: LUMBAR SPINE - 2-3 VIEW COMPARISON:  CT abdomen 07/26/2016 FINDINGS: There is worsened anterolisthesis at the L3-4 level, now approaching 15 mm. There could well be compressive stenosis at that level. Distant fusion at the L4-5 level without apparent change. Slight worsening of degenerative changes at L 2 3, primarily facet arthropathy. Distant fusion at the T12-L1 level. IMPRESSION: Worsened findings at the L3-4 level, with increased  anterolisthesis, now up to 1.5 cm. There could well be significant stenosis at this level. Electronically Signed   By: Nelson Chimes M.D.   On: 02/15/2020 16:00   I, Lynne Leader, personally (independently) visualized and performed the interpretation of the images attached in this note.     Assessment and Plan: 86 y.o. female with right lumbar radiculopathy and possibly trochanteric bursitis.  Had initial benefit with prednisone course and physical therapy but patient has had subsequent worsening.  Plan to further evaluate cause of radicular pain with MRI.  Addition will repeat course of prednisone.  Recheck following MRI for likely epidural steroid injection planning.   PDMP not reviewed this encounter. Orders Placed This Encounter  Procedures  . MR Lumbar Spine Wo Contrast    Standing Status:   Future    Standing Expiration Date:   03/28/2021    Order Specific Question:   What is the patient's sedation requirement?    Answer:   No Sedation    Order Specific Question:   Does the patient have a pacemaker or implanted devices?    Answer:   No    Order Specific Question:   Preferred imaging location?    Answer:   [facility] (table limit-350lbs)    Order Specific Question:   Radiology Contrast Protocol - do NOT remove file path    Answer:   \\[email or web address]   Meds ordered this encounter  Medications  . predniSONE (DELTASONE) 10 MG tablet    Sig: Take 3 tablets (30 mg total) by mouth daily with breakfast.    Dispense:  15 tablet    Refill:  0     Discussed warning signs or symptoms. Please see discharge instructions. Patient expresses understanding.   The above documentation has been reviewed and is accurate and complete Lynne Leader, M.D.

## 2020-03-28 NOTE — Patient Instructions (Addendum)
Thank you for coming in today. Plan for MRI Follow up with me after the MRI.  Let me know if you do not get contacted to schedule the MRI in the next few days.

## 2020-03-30 ENCOUNTER — Encounter: Payer: Medicare Other | Admitting: Rehabilitative and Restorative Service Providers"

## 2020-04-02 ENCOUNTER — Other Ambulatory Visit: Payer: Self-pay

## 2020-04-02 ENCOUNTER — Ambulatory Visit (INDEPENDENT_AMBULATORY_CARE_PROVIDER_SITE_OTHER): Payer: Medicare Other

## 2020-04-02 DIAGNOSIS — M4726 Other spondylosis with radiculopathy, lumbar region: Secondary | ICD-10-CM | POA: Diagnosis not present

## 2020-04-02 DIAGNOSIS — G8929 Other chronic pain: Secondary | ICD-10-CM | POA: Diagnosis not present

## 2020-04-02 DIAGNOSIS — M5416 Radiculopathy, lumbar region: Secondary | ICD-10-CM

## 2020-04-02 DIAGNOSIS — M545 Low back pain, unspecified: Secondary | ICD-10-CM

## 2020-04-02 DIAGNOSIS — D1809 Hemangioma of other sites: Secondary | ICD-10-CM | POA: Diagnosis not present

## 2020-04-02 DIAGNOSIS — M7061 Trochanteric bursitis, right hip: Secondary | ICD-10-CM | POA: Diagnosis not present

## 2020-04-02 DIAGNOSIS — M4316 Spondylolisthesis, lumbar region: Secondary | ICD-10-CM | POA: Diagnosis not present

## 2020-04-02 DIAGNOSIS — M5117 Intervertebral disc disorders with radiculopathy, lumbosacral region: Secondary | ICD-10-CM | POA: Diagnosis not present

## 2020-04-04 ENCOUNTER — Encounter: Payer: Medicare Other | Admitting: Rehabilitative and Restorative Service Providers"

## 2020-04-04 NOTE — Progress Notes (Signed)
MRI lumbar spine shows several changes.  You have some arthritis in the back and some areas where nerves could get pinched causing pain into the lower leg and into the thigh.  This probably explains the majority of the pain that you are having.  Next step is probably epidural steroid injection into the back to help the leg pain.  Recommend that you follow-up with me to to go over the results in full detail.  However I am happy to also order the epidural steroid injection now if you would like.

## 2020-04-05 ENCOUNTER — Telehealth: Payer: Self-pay | Admitting: Family Medicine

## 2020-04-05 NOTE — Telephone Encounter (Signed)
Returned pt's call and reviewed her MRI results.  Also scheduled her for an MRI f/u visit.

## 2020-04-05 NOTE — Telephone Encounter (Signed)
Pt calling for MRI results. Please call [phone] per pt request

## 2020-04-06 ENCOUNTER — Other Ambulatory Visit: Payer: Self-pay

## 2020-04-06 ENCOUNTER — Ambulatory Visit (INDEPENDENT_AMBULATORY_CARE_PROVIDER_SITE_OTHER): Payer: Medicare Other | Admitting: Family Medicine

## 2020-04-06 ENCOUNTER — Encounter: Payer: Medicare Other | Admitting: Rehabilitative and Restorative Service Providers"

## 2020-04-06 ENCOUNTER — Encounter: Payer: Self-pay | Admitting: Family Medicine

## 2020-04-06 VITALS — BP 118/60 | HR 79 | Ht 63.5 in | Wt 133.4 lb

## 2020-04-06 DIAGNOSIS — M5416 Radiculopathy, lumbar region: Secondary | ICD-10-CM

## 2020-04-06 DIAGNOSIS — M545 Low back pain: Secondary | ICD-10-CM | POA: Diagnosis not present

## 2020-04-06 DIAGNOSIS — G8929 Other chronic pain: Secondary | ICD-10-CM

## 2020-04-06 NOTE — Progress Notes (Signed)
I, Wendy Poet, LAT, ATC, am serving as scribe for Dr. Lynne Leader.  Briana Fitzgerald is a 86 y.o. female who presents to [place] at [facility] today for f/u of LBP and R leg pain and L-spine MRI review.  She was last seen by Dr. Georgina Snell on 03/28/20 and was referred for a lumbar spine MRI.  She has been taking prednisone and Gabapentin intermittently.  She has completed 5 PT sessions.  Since her last visit, pt reports that she feels about the same, perhaps slightly worse.  She states that she is having the most pain in her low back.  Diagnostic testing: L-spine MRI- 04/02/20; L-spine XR- 02/15/20   Pertinent review of systems: No fevers or chills  Relevant historical information: History lumbar decompression surgery.   Exam:  BP (!) 118/60 (BP Location: Right Arm, Patient Position: Sitting, Cuff Size: Normal)   Pulse 79   Ht 5' 3.5" (1.613 m)   Wt 133 lb 6.4 oz (60.5 kg)   SpO2 96%   BMI 23.26 kg/m  General: Well Developed, well nourished, and in no acute distress.   MSK: L-spine nontender midline.  Lower extremity strength is intact.    Lab and Radiology Results EXAM: MRI LUMBAR SPINE WITHOUT CONTRAST  TECHNIQUE: Multiplanar, multisequence MR imaging of the lumbar spine was performed. No intravenous contrast was administered.  COMPARISON:  02/15/2020 lumbar spine radiographs.  FINDINGS: Segmentation: There are 5 non rib-bearing lumbar type vertebral bodies. Small left lumbosacral pseudoarthrosis.  Alignment: Grade 1 T12-L1 retrolisthesis with partial fusion. Grade 1 L3-4 anterolisthesis. Grade 1 L4-5 retrolisthesis.  Vertebrae: Normal bone marrow signal intensity. Partial fusion at the L4-5 level. Scattered hemangiomata.  Conus medullaris and cauda equina: Conus extends to the L2 level. Conus and cauda equina appear normal.  Disc levels: Multilevel desiccation. Moderate to severe T12-L1 and L4-5 disc space loss. Mild disc space loss at  the remaining lumbar levels.  T12-L1: Grade 1 retrolisthesis. Mild spinal canal and bilateral neural foraminal narrowing.  L1-2: Disc bulge, ligamentum flavum and bilateral facet hypertrophy. Moderate right and mild left neural foraminal narrowing.  L2-3: Disc bulge, ligamentum flavum and bilateral facet hypertrophy. Moderate spinal canal and bilateral neural foraminal narrowing.  L3-4: Grade 1 anterolisthesis. Sequela of laminectomy. Disc bulge with superimposed central protrusion and bilateral facet hypertrophy. Mild right neural foraminal narrowing. No significant spinal canal or left neural foraminal narrowing.  L4-5: Partial fusion of the vertebral bodies with grade 1 retrolisthesis. Sequela of laminectomy. Bilateral facet degenerative spurring. Mild bilateral neural foraminal narrowing. No significant spinal canal narrowing.  L5-S1: Sequela of laminectomy. Disc bulge with superimposed right foraminal protrusion and bilateral facet hypertrophy. Moderate right neural foraminal narrowing. No significant spinal canal or left neural foraminal narrowing.  Paraspinal and other soft tissues: Sacral Tarlov cysts. Postsurgical appearance of the paraspinal tissues at the L3-5 level.  IMPRESSION: Multilevel spondylosis with grade 1 T12-L1 and L4-5 retrolisthesis. Grade 1 L3-4 anterolisthesis.  Moderate L2-3 spinal canal and neural foraminal narrowing.  Moderate right L1-2 and L5-S1 neural foraminal narrowing.  Mild spinal canal and bilateral neural foraminal narrowing at the T12-L1 level.   Electronically Signed   By: Primitivo Gauze M.D.   On: 04/04/2020 10:42 I, Lynne Leader, personally (independently) visualized and performed the interpretation of the images attached in this note.     Assessment and Plan: 87 y.o. female with lumbar radiculopathy.  Patient symptoms are across multiple nerve roots over the predominant one is L5 which corresponds to  her  L5-S1 neuroforaminal stenosis.  She likely also has some symptoms arriving from her L2-L3 stenosis as well.  Plan for epidural steroid injection at right L5-S1.  If not sufficient will do an injection at L2-3.  Discussed MRI results and plan with patient and her husband who expressed understanding and agreement.    Orders Placed This Encounter  Procedures  . DG INJECT DIAG/THERA/INC NEEDLE/CATH/PLC EPI/LUMB/SAC W/IMG    Standing Status:   Future    Standing Expiration Date:   04/06/2021    Order Specific Question:   Reason for Exam (SYMPTOM  OR DIAGNOSIS REQUIRED)    Answer:   right L5-S1 level. Technique per radiology    Order Specific Question:   Preferred Imaging Location?    Answer:   [id number]    Order Specific Question:   Radiology Contrast Protocol - do NOT remove file path    Answer:   \\[email or web address]   No orders of the defined types were placed in this encounter.    Discussed warning signs or symptoms. Please see discharge instructions. Patient expresses understanding.   The above documentation has been reviewed and is accurate and complete Lynne Leader, M.D.  Total encounter time 20 minutes including charting time date of service.

## 2020-04-06 NOTE — Patient Instructions (Addendum)
Thank you for coming in today. Plan for epidural steroid injection at Right L5-S1. This should help with pain at the side of the thigh and calf and into the foot.  Please call [facility] to schedule at [phone].  Let me know how you feel about 1-2 weeks after the shot.   We can do more at different levels and with different techniques.    Epidural Steroid Injection Patient Information  Description: The epidural space surrounds the nerves as they exit the spinal cord.  In some patients, the nerves can be compressed and inflamed by a bulging disc or a tight spinal canal (spinal stenosis).  By injecting steroids into the epidural space, we can bring irritated nerves into direct contact with a potentially helpful medication.  These steroids act directly on the irritated nerves and can reduce swelling and inflammation which often leads to decreased pain.  Epidural steroids may be injected anywhere along the spine and from the neck to the low back depending upon the location of your pain.   After numbing the skin with local anesthetic (like Novocaine), a small needle is passed into the epidural space slowly.  You may experience a sensation of pressure while this is being done.  The entire block usually last less than 10 minutes.  Conditions which may be treated by epidural steroids:   Low back and leg pain  Neck and arm pain  Spinal stenosis  Post-laminectomy syndrome  Herpes zoster (shingles) pain  Pain from compression fractures  Preparation for the injection:  1. Do not eat any solid food or dairy products within 8 hours of your appointment.  2. You may drink clear liquids up to 3 hours before appointment.  Clear liquids include water, black coffee, juice or soda.  No milk or cream please. 3. You may take your regular medication, including pain medications, with a sip of water before your appointment  Diabetics should hold regular insulin (if taken separately) and take 1/2  normal NPH dos the morning of the procedure.  Carry some sugar containing items with you to your appointment. 4. A driver must accompany you and be prepared to drive you home after your procedure.  5. Bring all your current medications with your. 6. An IV may be inserted and sedation may be given at the discretion of the physician.   7. A blood pressure cuff, EKG and other monitors will often be applied during the procedure.  Some patients may need to have extra oxygen administered for a short period. 8. You will be asked to provide medical information, including your allergies, prior to the procedure.  We must know immediately if you are taking blood thinners (like Coumadin/Warfarin)  Or if you are allergic to IV iodine contrast (dye). We must know if you could possible be pregnant.  Possible side-effects:  Bleeding from needle site  Infection (rare, may require surgery)  Nerve injury (rare)  Numbness & tingling (temporary)  Difficulty urinating (rare, temporary)  Spinal headache ( a headache worse with upright posture)  Light -headedness (temporary)  Pain at injection site (several days)  Decreased blood pressure (temporary)  Weakness in arm/leg (temporary)  Pressure sensation in back/neck (temporary)  Call if you experience:  Fever/chills associated with headache or increased back/neck pain.  Headache worsened by an upright position.  New onset weakness or numbness of an extremity below the injection site  Hives or difficulty breathing (go to the emergency room)  Inflammation or drainage at the infection site  Severe back/neck pain  Any new symptoms which are concerning to you  Please note:  Although the local anesthetic injected can often make your back or neck feel good for several hours after the injection, the pain will likely return.  It takes 3-7 days for steroids to work in the epidural space.  You may not notice any pain relief for at least that one  week.  If effective, we will often do a series of three injections spaced 3-6 weeks apart to maximally decrease your pain.  After the initial series, we generally will wait several months before considering a repeat injection of the same type.

## 2020-04-14 ENCOUNTER — Other Ambulatory Visit: Payer: Self-pay

## 2020-04-14 ENCOUNTER — Ambulatory Visit
Admission: RE | Admit: 2020-04-14 | Discharge: 2020-04-14 | Disposition: A | Discharge status: Home / Self Care | Payer: Medicare Other | Source: Ambulatory Visit | Attending: Family Medicine | Admitting: Family Medicine

## 2020-04-14 ENCOUNTER — Other Ambulatory Visit: Payer: Self-pay | Admitting: Family Medicine

## 2020-04-14 DIAGNOSIS — M545 Low back pain, unspecified: Secondary | ICD-10-CM

## 2020-04-14 DIAGNOSIS — M5416 Radiculopathy, lumbar region: Secondary | ICD-10-CM

## 2020-04-14 DIAGNOSIS — G8929 Other chronic pain: Secondary | ICD-10-CM

## 2020-04-14 MED ORDER — IOPAMIDOL (ISOVUE-M 200) INJECTION 41%
1.0000 mL | Freq: Once | INTRAMUSCULAR | Status: AC
  Administered 2020-04-14: 1 mL via EPIDURAL

## 2020-04-14 MED ORDER — METHYLPREDNISOLONE ACETATE 40 MG/ML INJ SUSP (RADIOLOG
120.0000 mg | Freq: Once | INTRAMUSCULAR | Status: AC
  Administered 2020-04-14: 120 mg via EPIDURAL

## 2020-04-14 NOTE — Discharge Instructions (Signed)

## 2020-05-01 ENCOUNTER — Telehealth: Payer: Self-pay | Admitting: Family Medicine

## 2020-05-01 ENCOUNTER — Other Ambulatory Visit: Payer: Self-pay

## 2020-05-01 ENCOUNTER — Ambulatory Visit (INDEPENDENT_AMBULATORY_CARE_PROVIDER_SITE_OTHER): Payer: Medicare Other | Admitting: Family Medicine

## 2020-05-01 ENCOUNTER — Encounter: Payer: Self-pay | Admitting: Family Medicine

## 2020-05-01 VITALS — BP 118/62 | HR 78 | Temp 97.7°F | Wt 134.0 lb

## 2020-05-01 DIAGNOSIS — D72820 Lymphocytosis (symptomatic): Secondary | ICD-10-CM | POA: Diagnosis not present

## 2020-05-01 DIAGNOSIS — G2581 Restless legs syndrome: Secondary | ICD-10-CM

## 2020-05-01 DIAGNOSIS — Z8669 Personal history of other diseases of the nervous system and sense organs: Secondary | ICD-10-CM

## 2020-05-01 LAB — CBC WITH DIFFERENTIAL/PLATELET
Absolute Monocytes: 461 cells/uL (ref 200–950)
Basophils Absolute: 32 cells/uL (ref 0–200)
Basophils Relative: 0.5 %
Eosinophils Absolute: 160 cells/uL (ref 15–500)
Eosinophils Relative: 2.5 %
HCT: 40.5 % (ref 35.0–45.0)
Hemoglobin: 14 g/dL (ref 11.7–15.5)
Lymphs Abs: 1421 cells/uL (ref 850–3900)
MCH: 28.9 pg (ref 27.0–33.0)
MCHC: 34.6 g/dL (ref 32.0–36.0)
MCV: 83.5 fL (ref 80.0–100.0)
MPV: 9.7 fL (ref 7.5–12.5)
Monocytes Relative: 7.2 %
Neutro Abs: 4326 cells/uL (ref 1500–7800)
Neutrophils Relative %: 67.6 %
Platelets: 134 10*3/uL — ABNORMAL LOW (ref 140–400)
RBC: 4.85 10*6/uL (ref 3.80–5.10)
RDW: 14.3 % (ref 11.0–15.0)
Total Lymphocyte: 22.2 %
WBC: 6.4 10*3/uL (ref 3.8–10.8)

## 2020-05-01 MED ORDER — MIRAPEX 0.5 MG PO TABS: 0.5000 mg | ORAL_TABLET | Freq: Three times a day (TID) | ORAL | 3 refills | Status: DC

## 2020-05-01 MED ORDER — FIORINAL 50-325-40 MG PO CAPS: 1.0000 | ORAL_CAPSULE | Freq: Four times a day (QID) | ORAL | 0 refills | Status: DC | PRN

## 2020-05-01 NOTE — Progress Notes (Signed)
Established Patient Office Visit  Subjective:  Patient ID: Briana Fitzgerald, female    DOB: 10-May-1932  Age: 86 y.o. MRN: [id number]  CC:  Chief Complaint  Patient presents with  . Medication Refill    Fiorinal    HPI [name] presents for discussion of medications and requesting some refills.  She states she has a longstanding history of migraine headaches.  These are usually retro-orbital on the left side and have become less frequent over time.  She for years has been on Fiorinal which she takes very infrequently.  She states a prescription for 30 tablets was given 2 years ago and she still has some leftover.  She states that generic did not seem to work for her and she is requested brand-name only.  Her headaches can be weeks apart or sometimes months apart.  She is not had any recent atypical headaches.  Usually she takes 1 Fiorinal and goes to sleep and when she wakes up her headache is gone.  She has restless leg syndrome.  She takes Mirapex 0.5 mg for that.  That generally works well.  She does sometimes have symptoms earlier in the evening around 6 PM.  Sometimes does use caffeine around 3 PM.  Denies any side effect from medication.  Requesting nongeneric Mirapex  History of lymphocytosis.  She has had previous anemia and takes iron supplement twice per week.  She is requesting repeat CBC.  Her last CBC was last October with hemoglobin 13.4.  Denies any significant dizziness.  Past Medical History:  Diagnosis Date  . Arthritis   . BACK PAIN 07/18/2008  . Bowel obstruction ([place])   . CONSTIPATION 10/02/2010  . GERD 10/02/2010  . Headache   . IBS (irritable bowel syndrome)   . LAMINECTOMY, LUMBAR, HX OF 06/16/2008  . OSTEOARTHRITIS 10/02/2010  . Pneumonia   . RESTLESS LEG SYNDROME 10/02/2010  . SACROILIAC JOINT DYSFUNCTION 06/16/2008  . Sleep apnea   . STYE 10/02/2010  . TRANSIENT ISCHEMIC ATTACKS, HX OF 10/02/2010    Past Surgical History:  Procedure Laterality  Date  . ABDOMINAL HYSTERECTOMY  1979   TAH, fibroids  . [facility] / [facility]  2005  . APPENDECTOMY  1941  . EYE SURGERY  2005   catarac,both eyes  . LIVER BIOPSY  1997   benign, hemangioma resection  . SPINE SURGERY  2006   laminectomy    Family History  Problem Relation Age of Onset  . Stroke Father   . Diabetes Father        type ll  . Pancreatitis Brother   . Breast cancer Paternal Aunt        aunt  . Colon cancer Neg Hx   . Stomach cancer Neg Hx   . Rectal cancer Neg Hx   . Liver cancer Neg Hx   . Esophageal cancer Neg Hx     Social History   Socioeconomic History  . Marital status: Married    Spouse name: Not on file  . Number of children: 2  . Years of education: Not on file  . Highest education level: Not on file  Occupational History  . Occupation: retired  Tobacco Use  . Smoking status: Never Smoker  . Smokeless tobacco: Never Used  Vaping Use  . Vaping Use: Never used  Substance and Sexual Activity  . Alcohol use: Yes    Comment: at dinner with friends, 2-3 per month - wine  . Drug use: No  .  Sexual activity: Not on file  Other Topics Concern  . Not on file  Social History Narrative  . Not on file   Social Determinants of Health   Financial Resource Strain:   . Difficulty of Paying Living Expenses:   Food Insecurity:   . Worried About Charity fundraiser in the Last Year:   . Arboriculturist in the Last Year:   Transportation Needs:   . Film/video editor (Medical):   Marland Kitchen Lack of Transportation (Non-Medical):   Physical Activity:   . Days of Exercise per Week:   . Minutes of Exercise per Session:   Stress:   . Feeling of Stress :   Social Connections:   . Frequency of Communication with Friends and Family:   . Frequency of Social Gatherings with Friends and Family:   . Attends Religious Services:   . Active Member of Clubs or Organizations:   . Attends Archivist Meetings:   Marland Kitchen Marital Status:   Intimate Partner  Violence:   . Fear of Current or Ex-Partner:   . Emotionally Abused:   Marland Kitchen Physically Abused:   . Sexually Abused:     Outpatient Medications Prior to Visit  Medication Sig Dispense Refill  . acetaminophen (TYLENOL) 500 MG tablet Take 500 mg by mouth every 6 (six) hours as needed.    . Calcium Carbonate-Vitamin D (CALTRATE 600+D) 600-400 MG-UNIT tablet Take 1 tablet by mouth daily.    . furosemide (LASIX) 40 MG tablet TAKE 1 TABLET BY MOUTH EVERY DAY 90 tablet 1  . gabapentin (NEURONTIN) 100 MG capsule Take 1-3 capsules (100-300 mg total) by mouth 3 (three) times daily as needed (nerve pain). Take mostly at night 30 capsule 3  . Misc Natural Products (TART CHERRY ADVANCED PO) Take 1,200 mg by mouth daily.    . Omega-3 Fatty Acids (SALMON OIL-1000 PO) Take 1 tablet by mouth daily.    . predniSONE (DELTASONE) 10 MG tablet Take 3 tablets (30 mg total) by mouth daily with breakfast. 15 tablet 0  . vitamin C (ASCORBIC ACID) 500 MG tablet Take 500 mg by mouth daily.    . vitamin E 400 UNIT capsule Take 400 Units by mouth daily.    . butalbital-aspirin-caffeine (FIORINAL) 50-325-40 MG capsule Take 1 capsule by mouth every 6 (six) hours as needed for headache. 30 capsule 0  . pramipexole (MIRAPEX) 0.5 MG tablet TAKE 1 TABLET (0.5 MG TOTAL) BY MOUTH AT BEDTIME. 90 tablet 3  . pyridoxine (B-6) 100 MG tablet Take 100 mg by mouth daily. (Patient not taking: Reported on 05/01/2020)    . rosuvastatin (CRESTOR) 5 MG tablet TAKE 1 TABLET BY MOUTH EVERY DAY 90 tablet 2  . vitamin B-12 (CYANOCOBALAMIN) 500 MCG tablet Take 500 mcg by mouth daily. (Patient not taking: Reported on 05/01/2020)     No facility-administered medications prior to visit.    No Known Allergies  ROS Review of Systems    Objective:    Physical Exam  BP 118/62 (BP Location: Left Arm, Patient Position: Sitting, Cuff Size: Normal)   Pulse 78   Temp 97.7 F (36.5 C) (Oral)   Wt 134 lb (60.8 kg)   SpO2 97%   BMI 23.36 kg/m  Wt  Readings from Last 3 Encounters:  05/01/20 134 lb (60.8 kg)  04/06/20 133 lb 6.4 oz (60.5 kg)  03/28/20 134 lb (60.8 kg)     Health Maintenance Due  Topic Date Due  . TETANUS/TDAP  Never done  . INFLUENZA VACCINE  04/16/2020    There are no preventive care reminders to display for this patient.  Lab Results  Component Value Date   TSH 3.22 03/03/2018   Lab Results  Component Value Date   WBC 4.7 07/15/2019   HGB 13.4 07/15/2019   HCT 39.9 07/15/2019   MCV 80.9 07/15/2019   PLT 100 (L) 07/15/2019   Lab Results  Component Value Date   NA 141 10/06/2019   K 4.2 10/06/2019   CHLORIDE 106 10/15/2016   CO2 33 (H) 10/06/2019   GLUCOSE 75 10/06/2019   BUN 17 10/06/2019   CREATININE 0.75 10/06/2019   BILITOT 1.4 (H) 10/06/2019   ALKPHOS 77 10/06/2019   AST 18 10/06/2019   ALT 17 10/06/2019   PROT 7.1 10/06/2019   ALBUMIN 4.3 10/06/2019   CALCIUM 10.6 (H) 10/06/2019   ANIONGAP 6 07/24/2018   EGFR 58 (L) 10/15/2016   GFR 73.32 10/06/2019   Lab Results  Component Value Date   CHOL 101 10/06/2019   Lab Results  Component Value Date   HDL 50.60 10/06/2019   Lab Results  Component Value Date   LDLCALC 38 10/06/2019   Lab Results  Component Value Date   TRIG 62.0 10/06/2019   Lab Results  Component Value Date   CHOLHDL 2 10/06/2019   No results found for: HGBA1C    Assessment & Plan:   Problem List Items Addressed This Visit      Unprioritized   Lymphocytosis   Relevant Orders   CBC with Differential/Platelet   History of migraine headaches   RESTLESS LEG SYNDROME - Primary    She has history of migraine headaches which are infrequent.  We discussed our concerns about drug like Fiorinal with increased risk for geriatric patients but she has been on this for many years and this seems to be effective for her with minimal side effect.  We agreed to limited refill #30.  She has not a candidate for triptans but could consider options such as  Ubrelvy  Refill Mirapex 0.5 mg 1 nightly.  Reminded to avoid caffeine use late in the day which could exacerbate restless legs  Patient requesting repeat CBC.  Her last hemoglobin last October was normal.  Meds ordered this encounter  Medications  . FIORINAL 50-325-40 MG capsule    Sig: Take 1 capsule by mouth every 6 (six) hours as needed for headache.    Dispense:  30 capsule    Refill:  0    Pt request Fiorinal (non-generic).    Follow-up: Return in about 6 months (around 11/01/2020).    Carolann Littler, MD

## 2020-05-01 NOTE — Telephone Encounter (Signed)
Pt decided to schedule an appt regarding her migraine medication. She is scheduled for today at 3

## 2020-05-01 NOTE — Telephone Encounter (Signed)
Need prescription for migraine medication. She said she doesn't want the generic it doesn't work for her. The medication is    Fiorimal? She said she has some from 2 years ago. I asked the pt if she wants to at least see [name]. She declined  Please advise

## 2020-05-02 ENCOUNTER — Telehealth: Payer: Self-pay | Admitting: Family Medicine

## 2020-05-02 NOTE — Telephone Encounter (Signed)
Pt called to give [place] an update after her injection ( done 7/30 ). Wanted to speak with [facility] or Dr. Georgina Snell. I tried to ask some questions to be able to provide some info, pt refused.

## 2020-05-02 NOTE — Telephone Encounter (Signed)
Returned pt's call and she states that her low back and hip are feeling better but she con't to have some pain in her lower leg.  Advise pt that sometimes patients need more than one epidural and inform her to contact us if she feels like her symptoms begin to worsen.

## 2020-05-02 NOTE — Telephone Encounter (Signed)
Patient was seen on 05/01/2020. Nothing further needed

## 2020-05-12 ENCOUNTER — Telehealth: Payer: Self-pay | Admitting: Family Medicine

## 2020-05-12 NOTE — Progress Notes (Signed)
  Chronic Care Management   Note  05/12/2020 Name: [name] MRN: [id number] DOB: [birth year]  Briana Fitzgerald is a 86 y.o. year old female who is a primary care patient of [name], [name], MD. I reached out to [employer] by phone today in response to a referral sent by Briana Fitzgerald's PCP, [name], [name], MD.   Briana Fitzgerald was given information about Chronic Care Management services today including:  1. CCM service includes personalized support from designated clinical staff supervised by her physician, including individualized plan of care and coordination with other care providers 2. 24/7 contact phone numbers for assistance for urgent and routine care needs. 3. Service will only be billed when office clinical staff spend 20 minutes or more in a month to coordinate care. 4. Only one practitioner may furnish and bill the service in a calendar month. 5. The patient may stop CCM services at any time (effective at the end of the month) by phone call to the office staff.   Patient agreed to services and verbal consent obtained.   Follow up plan:   Carley Perdue [employer] Scheduler

## 2020-05-16 ENCOUNTER — Telehealth: Payer: Self-pay | Admitting: Family Medicine

## 2020-05-16 DIAGNOSIS — G4733 Obstructive sleep apnea (adult) (pediatric): Secondary | ICD-10-CM

## 2020-05-16 DIAGNOSIS — I2510 Atherosclerotic heart disease of native coronary artery without angina pectoris: Secondary | ICD-10-CM

## 2020-05-16 DIAGNOSIS — I5030 Unspecified diastolic (congestive) heart failure: Secondary | ICD-10-CM

## 2020-05-16 NOTE — Telephone Encounter (Signed)
Pt said her medication   FIORINAL 50-325-40 MG capsule    Is to expensive and would like a generic or something else that will help with her migraines.  Please advise

## 2020-05-17 NOTE — Telephone Encounter (Signed)
Please advise 

## 2020-05-18 ENCOUNTER — Ambulatory Visit: Payer: Medicare Other | Admitting: Pharmacist

## 2020-05-18 ENCOUNTER — Other Ambulatory Visit: Payer: Self-pay

## 2020-05-18 DIAGNOSIS — Z8669 Personal history of other diseases of the nervous system and sense organs: Secondary | ICD-10-CM

## 2020-05-18 DIAGNOSIS — E7849 Other hyperlipidemia: Secondary | ICD-10-CM

## 2020-05-18 NOTE — Telephone Encounter (Signed)
Because of her age, we have to be very selective and careful with medications.   She is not a good candidate for tryptans b/o age.  I recommend we set up follow up to discuss options.  Roselyn Meier might be an option - but cost may be prohibitive.
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
## 2020-05-18 NOTE — Patient Instructions (Addendum)
Visit Information  Goals Addressed            This Visit's Progress   . Pharmacy care plan       CARE PLAN ENTRY (see longitudinal plan of care for additional care plan information)  Current Barriers:  . Chronic Disease Management support, education, and care coordination needs related to Hyperlipidemia, Heart Failure, GERD, Osteoporosis, and Osteoarthritis   Fluid retention/blood pressure control BP Readings from Last 3 Encounters:  05/01/20 118/62  04/14/20 (!) 152/73  04/06/20 (!) 118/60   . Pharmacist Clinical Goal(s): o Over the next 180 days, patient will work with PharmD and providers to maintain BP goal <130/80 . Current regimen:  o Furosemide 20 mg daily . Interventions: o Discussed diet and exercise o Discussed signs and symptoms of low and high blood pressure o Avoiding the use of ibuprofen (NSAIDs)  . Patient self care activities - Over the next 180 days, patient will: o Check blood pressure periodically, document, and provide at future appointments o Ensure daily salt intake < 2300 mg/day o Limit use of ibuprofen to avoid fluid build up  Hyperlipidemia Lab Results  Component Value Date/Time   LDLCALC 38 10/06/2019 12:18 PM   . Pharmacist Clinical Goal(s): o Over the next 180 days, patient will work with PharmD and providers to maintain LDL goal < 70 . Current regimen:  . Rosuvastatin 5 mg daily . Salmon oil 1000 mg daily . Patient self care activities - Over the next 180 days, patient will: o Continue taking medications.   Migraines . Pharmacist Clinical Goal(s) o Over the next 180 days, patient will work with PharmD and providers to control migraine/headaches . Current regimen:  o Fiorinal 50-325-40 mg as needed . Patient self care activities - Over the next 180 days, patient will: o Reach out to [facility] [place] if you need something immediately for migraines o Continue avoiding triggers of migraines   Osteoporosis . Pharmacist Clinical  Goal(s) o Over the next 180 days, patient will work with PharmD and providers to continue to support bone health . Current regimen:  o Calcium & Vit D 600 mg - 400 unit daily . Interventions: o Discussed the recommend [phone] units of vitamin D daily. o Recommend 1200 mg of calcium daily from dietary and supplemental sources. . Patient self care activities - Over the next 180 days, patient will: o Continue taking calcium and vitamin D to support bone health  Osteoarthritis/pain . Pharmacist Clinical Goal(s) o Over the next 180 days, patient will work with PharmD and providers to control pain related to arthritis. . Current regimen:  . Acetaminophen 500 mg twice daily . Ibuprofen 200 mg as needed . Interventions: o Discussed utilizing Tylenol (acetaminophen) more than Advil (ibuprofen) o Discussed the maximum recommended dose of acetaminophen is 3000 mg per day . Patient self care activities - Over the next 180 days, patient will: o Limit the use of ibuprofen and use more Tylenol for arthritis pain  Restless leg syndrome . Pharmacist Clinical Goal(s) o Over the next 180 days, patient will work with PharmD and providers to continue to avoid triggers of restless legs . Current regimen:  o Mirapex 0.5 mg once daily . Interventions: o Discussed the dosing of Mirapex as it was written . Patient self care activities - Over the next 180 days, patient will: o Continue current medications  Heartburn . Pharmacist Clinical Goal(s) o Over the next 180 days, patient will work with PharmD and providers to continue to  minimize heartburn symptoms . Current regimen:  o Tums as needed . Patient self care activities - Over the next 180 days, patient will: o Avoid heartburn triggers as best as possible o Continue taking Tums as needed for symptom relief  Medication management . Pharmacist Clinical Goal(s): o Over the next 180 days, patient will work with PharmD and providers to achieve optimal  medication adherence . [facility] . Interventions o Comprehensive medication review performed. o Continue current medication management strategy . Patient self care activities - Over the next 180 days, patient will: o Focus on medication adherence o Consider using [facility] as a more convenient option o Take medications as prescribed o Report any questions or concerns to PharmD and/or provider(s)  Initial goal documentation        Briana Fitzgerald was given information about Chronic Care Management services today including:  1. CCM service includes personalized support from designated clinical staff supervised by her physician, including individualized plan of care and coordination with other care providers 2. 24/7 contact phone numbers for assistance for urgent and routine care needs. 3. Standard insurance, coinsurance, copays and deductibles apply for chronic care management only during months in which we provide at least 20 minutes of these services. Most insurances cover these services at 100%, however patients may be responsible for any copay, coinsurance and/or deductible if applicable. This service may help you avoid the need for more expensive face-to-face services. 4. Only one practitioner may furnish and bill the service in a calendar month. 5. The patient may stop CCM services at any time (effective at the end of the month) by phone call to the office staff.  Patient agreed to services and verbal consent obtained.   The patient verbalized understanding of instructions provided today and agreed to receive a mailed copy of patient instruction and/or educational materials. Telephone follow up appointment with pharmacy team member scheduled for: Feb 2022    Jeni Salles, PharmD Clinical Pharmacist [facility]   Eating Plan for Osteoporosis Osteoporosis causes your bones to become weak and brittle. This puts you at greater  risk for bone breaks (fractures) from small bumps or falls. Making changes to your diet and increasing your physical activity can help strengthen your bones and improve your overall health. Calcium and vitamin D are nutrients that play an important role in bone health. Vitamin D helps your body use calcium and strengthen bones. Therefore, it is important to get enough calcium and vitamin D as part of your eating plan for osteoporosis. What are tips for following this plan? Reading food labels  Try to get at least 1,000 milligrams (mg) of calcium each day.  Look for foods that have at least 50 mg of calcium per serving.  Talk with your health care provider about taking a calcium supplement if you do not get enough calcium from food.  Do not have more than 2,500 mg of calcium each day. This is the upper limit for food and nutritional supplements combined. Too much calcium may cause constipation and prevent you from absorbing other important nutrients.  Choose foods that contain vitamin D.  Take a daily vitamin supplement that contains 800-1,000 international units (IU) of vitamin D. The amount may be different depending on your age, body weight, ethnicity, and where you live. Talk with your dietitian or health care provider about how much vitamin D is right for you.  Avoid foods that have more than 300 mg of sodium per serving.  Too much sodium can cause your body to lose calcium.  Talk with your dietitian or health care provider about how much sodium you are allowed each day. Shopping  Do not buy foods with added salt, including: ? Salted snacks. ? Angie Fava. ? Canned soups. ? Canned meats. ? Processed meats, such as bacon or cold cuts. ? Smoked fish. Meal planning  Eat balanced meals that contain protein foods, fruits and vegetables, and foods rich in calcium and vitamin D.  Eat at least 5 servings of fruits and vegetables each day.  Eat 5-6 oz. of lean meat, poultry, fish, eggs, or  beans each day. Lifestyle  Do not use any products that contain nicotine or tobacco, such as cigarettes and e-cigarettes. If you need help quitting, ask your health care provider.  If your health care provider recommends that you lose weight: ? Work with a dietitian to develop an eating plan that will help you reach your desired weight goal. ? Exercise for at least 30 minutes a day, 5 or more days a week, or as told by your health care provider.  Work with a physical therapist to develop an exercise plan that includes flexibility, balance, and strength exercises.  If you drink alcohol, limit how much you have. This means: ? 0-1 drink a day for women. ? 0-2 drinks a day for men. ? Be aware of how much alcohol is in your drink. In the [place] one drink equals one typical bottle of beer (12 oz), one-half glass of wine (5 oz), or one shot of hard liquor (1 oz). What foods should I eat? Foods high in calcium   Yogurt. Yogurt with fruit.  Milk. Evaporated skim milk. Dry milk powder.  Calcium-fortified orange juice.  Parmesan cheese. Part-skim ricotta cheese. Natural hard cheese. Cream cheese. Cottage cheese.  Canned sardines. Canned salmon.  Calcium-treated tofu. Calcium-fortified cereal bar. Calcium-fortified cereal. Calcium-fortified graham crackers.  Cooked collard greens. Turnip greens. Broccoli. Kale.  Almonds.  White beans.  Corn tortilla. Foods high in vitamin D  Cod liver oil. Fatty fish, such as tuna, mackerel, and salmon.  Milk. Fortified soy milk. Fortified fruit juice.  Yogurt. Margarine.  Egg yolks. Foods high in protein  Beef. Lamb. Pork tenderloin.  Chicken breast.  Tuna (canned). Fish fillet.  Tofu.  Soy beans (cooked). Soy patty. Beans (canned or cooked).  Cottage cheese.  Yogurt.  Peanut butter.  Pumpkin seeds. Nuts. Sunflower seeds.  Hard cheese.  Milk or other milk products, such as soy milk. The items listed above may not be a  complete list of foods and beverages you can eat. Contact a dietitian for more options. Summary  Calcium and vitamin D are nutrients that play an important role in bone health and are an important part of your eating plan for osteoporosis.  Eat balanced meals that contain protein foods, fruits and vegetables, and foods rich in calcium and vitamin D.  Avoid foods that have more than 300 mg of sodium per serving. Too much sodium can cause your body to lose calcium.  Exercise is an important part of prevention and treatment of osteoporosis. Aim for at least 30 minutes a day, 5 days a week. This information is not intended to replace advice given to you by your health care provider. Make sure you discuss any questions you have with your health care provider. Document Revised: 11/10/2017 Document Reviewed: 11/10/2017 Elsevier Patient Education  2020 [employer].

## 2020-05-21 ENCOUNTER — Telehealth: Payer: Self-pay | Admitting: Family Medicine

## 2020-05-21 MED ORDER — UBRELVY 50 MG PO TABS: ORAL_TABLET | ORAL | 5 refills | Status: DC

## 2020-05-21 NOTE — Telephone Encounter (Signed)
She should be on Mirapex one po qhs- NOT TID as re-ordered  Will d/c Fiorinal and try Ubrelvy if we can get covered  Order for 25 OH Vit D placed.  She has hx of mild hypercalcemia.

## 2020-05-23 NOTE — Telephone Encounter (Signed)
Spoke to pt and she stated that she picked up a generic brand medication that was cheaper. Pt picked up Rx stated in your comment. Pt declined visit.

## 2020-06-08 ENCOUNTER — Telehealth: Payer: Self-pay | Admitting: Family Medicine

## 2020-06-08 NOTE — Telephone Encounter (Signed)
Spoke to pt and she stated she would like refills on Furosemide. Pt stated Dr.Skains was the one sending this Rx in to the pharmacy. Pt stated that she would like for Dr.[name] to fill it. Advised pt that she still has refills on Rx from Dr[facility]Skains nad [facility]. Pt verbalized understanding.

## 2020-06-08 NOTE — Telephone Encounter (Signed)
Pt is wanting a call back to speak with the [facility]

## 2020-07-19 ENCOUNTER — Telehealth: Payer: Self-pay | Admitting: Family Medicine

## 2020-07-19 NOTE — Telephone Encounter (Signed)
Copied from [place] [id number]. Topic: Medicare AWV >> Jul 19, 2020  1:51 PM Cher Nakai R wrote: Reason for CRM:  Patient declined the Medicare Wellness Visit with [facility]   Stated she has already completed with someone in the office - I tried to explain that she has not and that it needed to be documented with our office

## 2020-08-31 ENCOUNTER — Other Ambulatory Visit: Payer: Self-pay | Admitting: Cardiology

## 2020-09-05 ENCOUNTER — Telehealth: Payer: Self-pay | Admitting: Family Medicine

## 2020-09-05 MED ORDER — PRAMIPEXOLE DIHYDROCHLORIDE 0.5 MG PO TABS: 0.5000 mg | ORAL_TABLET | Freq: Every day | ORAL | 1 refills | Status: DC

## 2020-09-05 NOTE — Telephone Encounter (Signed)
Pt is calling in stating that she only has 4 pills and would like to have it refilled before the holiday so that she is able to sleep.  Rx pramipexole (MIRAPEX) 0.5 MG  [facility] [place] [address]

## 2020-10-30 DIAGNOSIS — C44722 Squamous cell carcinoma of skin of right lower limb, including hip: Secondary | ICD-10-CM | POA: Diagnosis not present

## 2020-10-30 DIAGNOSIS — D485 Neoplasm of uncertain behavior of skin: Secondary | ICD-10-CM | POA: Diagnosis not present

## 2020-11-09 ENCOUNTER — Telehealth: Payer: Medicare Other

## 2020-11-10 ENCOUNTER — Telehealth: Payer: Medicare Other

## 2020-11-17 DIAGNOSIS — C44722 Squamous cell carcinoma of skin of right lower limb, including hip: Secondary | ICD-10-CM | POA: Diagnosis not present

## 2020-11-26 ENCOUNTER — Other Ambulatory Visit: Payer: Self-pay | Admitting: Cardiology

## 2020-12-01 DIAGNOSIS — C44729 Squamous cell carcinoma of skin of left lower limb, including hip: Secondary | ICD-10-CM | POA: Diagnosis not present

## 2020-12-01 DIAGNOSIS — D485 Neoplasm of uncertain behavior of skin: Secondary | ICD-10-CM | POA: Diagnosis not present

## 2020-12-15 DIAGNOSIS — C44729 Squamous cell carcinoma of skin of left lower limb, including hip: Secondary | ICD-10-CM | POA: Diagnosis not present

## 2020-12-20 DIAGNOSIS — T8141XA Infection following a procedure, superficial incisional surgical site, initial encounter: Secondary | ICD-10-CM | POA: Diagnosis not present

## 2020-12-26 ENCOUNTER — Other Ambulatory Visit: Payer: Self-pay

## 2020-12-26 ENCOUNTER — Encounter: Payer: Self-pay | Admitting: Family Medicine

## 2020-12-26 ENCOUNTER — Ambulatory Visit (INDEPENDENT_AMBULATORY_CARE_PROVIDER_SITE_OTHER): Payer: Medicare Other | Admitting: Family Medicine

## 2020-12-26 VITALS — BP 120/60 | HR 79 | Temp 97.7°F | Wt 134.1 lb

## 2020-12-26 DIAGNOSIS — M25512 Pain in left shoulder: Secondary | ICD-10-CM | POA: Diagnosis not present

## 2020-12-26 DIAGNOSIS — R531 Weakness: Secondary | ICD-10-CM | POA: Diagnosis not present

## 2020-12-26 DIAGNOSIS — R296 Repeated falls: Secondary | ICD-10-CM | POA: Diagnosis not present

## 2020-12-26 DIAGNOSIS — E7849 Other hyperlipidemia: Secondary | ICD-10-CM | POA: Diagnosis not present

## 2020-12-26 LAB — CBC WITH DIFFERENTIAL/PLATELET
Basophils Absolute: 0 10*3/uL (ref 0.0–0.1)
Basophils Relative: 0.8 % (ref 0.0–3.0)
Eosinophils Absolute: 0.1 10*3/uL (ref 0.0–0.7)
Eosinophils Relative: 2.1 % (ref 0.0–5.0)
HCT: 39.3 % (ref 36.0–46.0)
Hemoglobin: 13.2 g/dL (ref 12.0–15.0)
Lymphocytes Relative: 17.4 % (ref 12.0–46.0)
Lymphs Abs: 1 10*3/uL (ref 0.7–4.0)
MCHC: 33.6 g/dL (ref 30.0–36.0)
MCV: 82.5 fl (ref 78.0–100.0)
Monocytes Absolute: 0.5 10*3/uL (ref 0.1–1.0)
Monocytes Relative: 7.5 % (ref 3.0–12.0)
Neutro Abs: 4.3 10*3/uL (ref 1.4–7.7)
Neutrophils Relative %: 72.2 % (ref 43.0–77.0)
Platelets: 155 10*3/uL (ref 150.0–400.0)
RBC: 4.77 Mil/uL (ref 3.87–5.11)
RDW: 14.9 % (ref 11.5–15.5)
WBC: 6 10*3/uL (ref 4.0–10.5)

## 2020-12-26 LAB — LIPID PANEL
Cholesterol: 67 mg/dL (ref 0–200)
HDL: 35.1 mg/dL — ABNORMAL LOW (ref >39.00)
LDL Cholesterol: 21 mg/dL (ref 0–99)
NonHDL: 31.77
Total CHOL/HDL Ratio: 2
Triglycerides: 55 mg/dL (ref 0.0–149.0)
VLDL: 11 mg/dL (ref 0.0–40.0)

## 2020-12-26 LAB — HEPATIC FUNCTION PANEL
ALT: 13 U/L (ref 0–35)
AST: 18 U/L (ref 0–37)
Albumin: 3.7 g/dL (ref 3.5–5.2)
Alkaline Phosphatase: 63 U/L (ref 39–117)
Bilirubin, Direct: 0.4 mg/dL — ABNORMAL HIGH (ref 0.0–0.3)
Total Bilirubin: 1.4 mg/dL — ABNORMAL HIGH (ref 0.2–1.2)
Total Protein: 6.5 g/dL (ref 6.0–8.3)

## 2020-12-26 LAB — BASIC METABOLIC PANEL
BUN: 14 mg/dL (ref 6–23)
CO2: 28 mEq/L (ref 19–32)
Calcium: 10.2 mg/dL (ref 8.4–10.5)
Chloride: 105 mEq/L (ref 96–112)
Creatinine, Ser: 0.72 mg/dL (ref 0.40–1.20)
GFR: 75.43 mL/min (ref >60.00)
Glucose, Bld: 86 mg/dL (ref 70–99)
Potassium: 3.9 mEq/L (ref 3.5–5.1)
Sodium: 139 mEq/L (ref 135–145)

## 2020-12-26 LAB — VITAMIN D 25 HYDROXY (VIT D DEFICIENCY, FRACTURES): VITD: 46.85 ng/mL (ref 30.00–100.00)

## 2020-12-26 LAB — TSH: TSH: 2.98 u[IU]/mL (ref 0.35–4.50)

## 2020-12-26 NOTE — Patient Instructions (Signed)
Consider trial of OTC Prilosec 20 mg daily - to see if this helps the cough and upper airway mucus  Consider elevate head of bed 4-6 inches.  We are setting up home PT. Preventing Falls and Fractures  Falls can be very serious, especially for older adults or people with osteoporosis  Falls can be caused by:  Tripping or slipping  Slow reflexes  Balance problems  Reduced muscle strength  Poor vision or a recent change in prescription  Illness and some medications (especially blood pressure pills, diuretics, heart medicines, muscle relaxants and sleep medications)  Drinking alcohol  To prevent falls outdoors:  Use a can or walker if needed  Wear rubber-soled shoes so you don't slip  DO NOT buy "shape up" shoes with rocker bottom soles if you have balance problems.  The thick soles and shape make it more difficult to keep your balance.  Put kitty litter or salt on icy sidewalks  Walk on the grass if the sidewalks are slick  Avoid walking on uneven ground whenever possible  T prevent falls indoors:  Keep rooms clutter-free, especially hallways, stairs and paths to light switches  Remove throw rugs  Install night lights, especially to and in the bathroom  Turn on lights before going downstairs  Keep a flashlight next to your bed  Buy a cordless phone to keep with you instead of jumping up to answer the phone  Install grab bars in the bathroom near the shower and toilet  Install rails on both sides of the stairs.  Make sure the stairs are well lit  Wear slippers with non-skid soles.  Do not walk around in stockings or socks  Balance problems and dizziness are not a normal part of growing older.  If you begin having balance problems or dizziness see your doctor.  Physical Therapy can help you with many balance problems, strengthening hip and leg muscles and with gait training.  To keep your bones healthy make sure you are getting enough calcium and Vitamin D each  day.  Ask your doctor or pharmacist about supplements.  Regular weight-bearing exercise like walking, lifting weights or dancing can help strengthen bones and prevent osteoporosis.

## 2020-12-26 NOTE — Progress Notes (Signed)
Established Patient Office Visit  Subjective:  Patient ID: Briana Fitzgerald, female    DOB: December 05, 1931  Age: 87 y.o. MRN: [id number]  CC:  Chief Complaint  Patient presents with  . Shortness of Breath    Gets dizzy, 3 falls this year, chest congestion, thick mucus    HPI Briana Fitzgerald presents for frequent falls, generalized weakness, and left shoulder pain.  [name]  Her chronic problems include history of obstructive sleep apnea, diastolic heart failure, GERD, osteoarthritis, osteoporosis, history of migraine headaches  She relates multiple falls at least 3 times during the past year.  Frequently feels dizzy.  She notes when she gets up quickly she feels very lightheaded.  No recent syncope.  She is also difficulty getting up from seated to standing frequently especially without a good armrest.  She feels much weaker overall.  She had one recent fall where she fell backwards fairly sharply against an object.  She has had some left shoulder pain since then.  She has past history of complicated comminuted fracture left humerus which required surgery.  She is concerned about that shoulder now.  This has improved slightly over the past few weeks.  She relates early morning cough for months.  She feels like she has increased mucus in her throat.  Not aware of any obvious GERD symptoms.  No obvious wheezes.  Frequently clears her throat.  No hemoptysis.  Never smoked.   She has hyperlipidemia and is on Crestor and overdue for labs.  Past Medical History:  Diagnosis Date  . Arthritis   . BACK PAIN 07/18/2008  . Bowel obstruction ([place])   . CONSTIPATION 10/02/2010  . GERD 10/02/2010  . Headache   . IBS (irritable bowel syndrome)   . LAMINECTOMY, LUMBAR, HX OF 06/16/2008  . OSTEOARTHRITIS 10/02/2010  . Pneumonia   . RESTLESS LEG SYNDROME 10/02/2010  . SACROILIAC JOINT DYSFUNCTION 06/16/2008  . Sleep  apnea   . STYE 10/02/2010  . TRANSIENT ISCHEMIC ATTACKS, HX OF 10/02/2010    Past Surgical History:  Procedure Laterality Date  . ABDOMINAL HYSTERECTOMY  1979   TAH, fibroids  . [place] / [place]  2005  . APPENDECTOMY  1941  . EYE SURGERY  2005   catarac,both eyes  . LIVER BIOPSY  1997   benign, hemangioma resection  . SPINE SURGERY  2006   laminectomy    Family History  Problem Relation Age of Onset  . Stroke Father   . Diabetes Father        type ll  . Pancreatitis Brother   . Breast cancer Paternal Aunt        aunt  . Colon cancer Neg Hx   . Stomach cancer Neg Hx   . Rectal cancer Neg Hx   . Liver cancer Neg Hx   . Esophageal cancer Neg Hx     Social History   Socioeconomic History  . Marital status: Married    Spouse name: Not on file  . Number of children: 2  . Years of education: Not on file  . Highest education level: Not on file  Occupational History  . Occupation: retired  Tobacco Use  . Smoking status: Never Smoker  . Smokeless tobacco: Never Used  Vaping Use  . Vaping Use: Never used  Substance and Sexual Activity  . Alcohol use: Yes    Comment: at dinner with friends, 2-3 per  month - wine  . Drug use: No  . Sexual activity: Not on file  Other Topics Concern  . Not on file  Social History Narrative  . Not on file   Social Determinants of Health   Financial Resource Strain: Low Risk   . Difficulty of Paying Living Expenses: Not very hard  Food Insecurity: Not on file  Transportation Needs: No Transportation Needs  . Lack of Transportation (Medical): No  . Lack of Transportation (Non-Medical): No  Physical Activity: Not on file  Stress: Not on file  Social Connections: Not on file  Intimate Partner Violence: Not on file    Outpatient Medications Prior to Visit  Medication Sig Dispense Refill  . acetaminophen (TYLENOL) 500 MG tablet Take 500 mg by mouth every 6 (six) hours as needed.    . Calcium Carbonate-Vitamin D 600-400  MG-UNIT tablet Take 1 tablet by mouth daily.    . ferrous sulfate 324 MG TBEC Take 324 mg by mouth 2 (two) times a week.    . furosemide (LASIX) 40 MG tablet TAKE 1 TABLET BY MOUTH EVERY DAY 90 tablet 1  . Misc Natural Products (TART CHERRY ADVANCED PO) Take 1,200 mg by mouth daily.    . Omega-3 Fatty Acids (SALMON OIL-1000 PO) Take 1 tablet by mouth daily.    . pramipexole (MIRAPEX) 0.5 MG tablet Take 1 tablet (0.5 mg total) by mouth at bedtime. 90 tablet 1  . pyridoxine (B-6) 100 MG tablet Take 100 mg by mouth daily.    . rosuvastatin (CRESTOR) 5 MG tablet TAKE 1 TABLET BY MOUTH DAILY.MAKE YEARLY APPT WITH DR. Marlou Porch FOR [facility] 2022 FOR FUTURE REFILLS 90 tablet 3  . Ubrogepant (UBRELVY) 50 MG TABS Take one tablet by mouth as needed for migraine headache.  May repeat dose times one after 2 hours as needed for ongoing headache. 12 tablet 5  . vitamin B-12 (CYANOCOBALAMIN) 500 MCG tablet Take 500 mcg by mouth daily.    . vitamin C (ASCORBIC ACID) 500 MG tablet Take 500 mg by mouth 2 (two) times a week.     . vitamin E 400 UNIT capsule Take 400 Units by mouth daily.    . predniSONE (DELTASONE) 10 MG tablet Take 3 tablets (30 mg total) by mouth daily with breakfast. 15 tablet 0  . gabapentin (NEURONTIN) 100 MG capsule Take 1-3 capsules (100-300 mg total) by mouth 3 (three) times daily as needed (nerve pain). Take mostly at night (Patient not taking: Reported on 12/26/2020) 30 capsule 3   No facility-administered medications prior to visit.    No Known Allergies  ROS Review of Systems  Constitutional: Positive for fatigue.  HENT: Negative for postnasal drip, sinus pain and trouble swallowing.   Respiratory: Positive for cough. Negative for shortness of breath.   Cardiovascular: Negative for chest pain.  Gastrointestinal: Negative for abdominal pain.  Neurological: Positive for dizziness, weakness and light-headedness. Negative for seizures, syncope, speech difficulty and headaches.       Objective:    Physical Exam Vitals reviewed.  Cardiovascular:     Rate and Rhythm: Normal rate and regular rhythm.  Pulmonary:     Effort: Pulmonary effort is normal.     Breath sounds: Normal breath sounds. No decreased breath sounds, wheezing or rales.  Musculoskeletal:     Comments: He has long scar left shoulder from prior surgery.  She has fairly good range of motion left shoulder with no specific point tenderness.  No visible swelling.  Neurological:  Mental Status: She is alert.     Comments: Rotator cuff strength seems very weak bilaterally     BP 120/60 (BP Location: Left Arm, Patient Position: Sitting, Cuff Size: Normal)   Pulse 79   Temp 97.7 F (36.5 C) (Oral)   Wt 134 lb 1.6 oz (60.8 kg)   SpO2 95%   BMI 23.38 kg/m  Wt Readings from Last 3 Encounters:  12/26/20 134 lb 1.6 oz (60.8 kg)  05/01/20 134 lb (60.8 kg)  04/06/20 133 lb 6.4 oz (60.5 kg)     Health Maintenance Due  Topic Date Due  . TETANUS/TDAP  Never done  . COVID-19 Vaccine (3 - Pfizer risk 4-dose series) 12/01/2019    There are no preventive care reminders to display for this patient.  Lab Results  Component Value Date   TSH 3.22 03/03/2018   Lab Results  Component Value Date   WBC 6.4 05/01/2020   HGB 14.0 05/01/2020   HCT 40.5 05/01/2020   MCV 83.5 05/01/2020   PLT 134 (L) 05/01/2020   Lab Results  Component Value Date   NA 141 10/06/2019   K 4.2 10/06/2019   CHLORIDE 106 10/15/2016   CO2 33 (H) 10/06/2019   GLUCOSE 75 10/06/2019   BUN 17 10/06/2019   CREATININE 0.75 10/06/2019   BILITOT 1.4 (H) 10/06/2019   ALKPHOS 77 10/06/2019   AST 18 10/06/2019   ALT 17 10/06/2019   PROT 7.1 10/06/2019   ALBUMIN 4.3 10/06/2019   CALCIUM 10.6 (H) 10/06/2019   ANIONGAP 6 07/24/2018   EGFR 58 (L) 10/15/2016   GFR 73.32 10/06/2019   Lab Results  Component Value Date   CHOL 101 10/06/2019   Lab Results  Component Value Date   HDL 50.60 10/06/2019   Lab Results  Component  Value Date   LDLCALC 38 10/06/2019   Lab Results  Component Value Date   TRIG 62.0 10/06/2019   Lab Results  Component Value Date   CHOLHDL 2 10/06/2019   No results found for: HGBA1C    Assessment & Plan:   #1 frequent falls.  Progressive generalized weakness over the past few years.  She is having difficulty with transfers such as getting up from seated to standing.  Very high risk for falls and further injury  -Strongly recommend setting up physical therapy.  Since she cannot drive and neither does her husband she qualifies for home health PT and OT will set these up  #2 frequent early morning cough.  Question GERD related.  Does not any red flags such as weight loss, hemoptysis, etc. -Recommend elevate head of bed 4 to 6 inches -Recommend trial of Prilosec 20 mg once daily and be in touch if cough not resolving over the next several weeks  #3 left shoulder pain following fall.  Prior history of shoulder surgery -Obtain x-rays left shoulder.  Her x-ray tech was out today and she will return tomorrow for that  #4 hyperlipidemia treated with Crestor -Check lipid and hepatic panel  No orders of the defined types were placed in this encounter.   Follow-up: No follow-ups on file.    Carolann Littler, MD

## 2020-12-26 NOTE — Addendum Note (Signed)
Addended by: Elmer Picker on: 12/26/2020 11:33 AM   Modules accepted: Orders

## 2020-12-27 ENCOUNTER — Other Ambulatory Visit: Payer: Medicare Other

## 2020-12-27 ENCOUNTER — Ambulatory Visit (INDEPENDENT_AMBULATORY_CARE_PROVIDER_SITE_OTHER): Payer: Medicare Other

## 2020-12-27 DIAGNOSIS — M25512 Pain in left shoulder: Secondary | ICD-10-CM

## 2020-12-27 DIAGNOSIS — S42202A Unspecified fracture of upper end of left humerus, initial encounter for closed fracture: Secondary | ICD-10-CM | POA: Diagnosis not present

## 2020-12-27 DIAGNOSIS — I700 Atherosclerosis of aorta: Secondary | ICD-10-CM | POA: Diagnosis not present

## 2021-01-12 DIAGNOSIS — K5900 Constipation, unspecified: Secondary | ICD-10-CM | POA: Diagnosis not present

## 2021-01-12 DIAGNOSIS — G4733 Obstructive sleep apnea (adult) (pediatric): Secondary | ICD-10-CM | POA: Diagnosis not present

## 2021-01-12 DIAGNOSIS — G43909 Migraine, unspecified, not intractable, without status migrainosus: Secondary | ICD-10-CM | POA: Diagnosis not present

## 2021-01-12 DIAGNOSIS — K589 Irritable bowel syndrome without diarrhea: Secondary | ICD-10-CM | POA: Diagnosis not present

## 2021-01-12 DIAGNOSIS — I2510 Atherosclerotic heart disease of native coronary artery without angina pectoris: Secondary | ICD-10-CM | POA: Diagnosis not present

## 2021-01-12 DIAGNOSIS — K219 Gastro-esophageal reflux disease without esophagitis: Secondary | ICD-10-CM | POA: Diagnosis not present

## 2021-01-12 DIAGNOSIS — Z79899 Other long term (current) drug therapy: Secondary | ICD-10-CM | POA: Diagnosis not present

## 2021-01-12 DIAGNOSIS — I5030 Unspecified diastolic (congestive) heart failure: Secondary | ICD-10-CM | POA: Diagnosis not present

## 2021-01-12 DIAGNOSIS — M810 Age-related osteoporosis without current pathological fracture: Secondary | ICD-10-CM | POA: Diagnosis not present

## 2021-01-12 DIAGNOSIS — E7849 Other hyperlipidemia: Secondary | ICD-10-CM | POA: Diagnosis not present

## 2021-01-12 DIAGNOSIS — M1990 Unspecified osteoarthritis, unspecified site: Secondary | ICD-10-CM | POA: Diagnosis not present

## 2021-01-12 DIAGNOSIS — Z8673 Personal history of transient ischemic attack (TIA), and cerebral infarction without residual deficits: Secondary | ICD-10-CM | POA: Diagnosis not present

## 2021-01-12 DIAGNOSIS — G2581 Restless legs syndrome: Secondary | ICD-10-CM | POA: Diagnosis not present

## 2021-01-12 DIAGNOSIS — Z9181 History of falling: Secondary | ICD-10-CM | POA: Diagnosis not present

## 2021-01-12 DIAGNOSIS — M419 Scoliosis, unspecified: Secondary | ICD-10-CM | POA: Diagnosis not present

## 2021-01-12 DIAGNOSIS — M5030 Other cervical disc degeneration, unspecified cervical region: Secondary | ICD-10-CM | POA: Diagnosis not present

## 2021-01-12 DIAGNOSIS — M2170 Unequal limb length (acquired), unspecified site: Secondary | ICD-10-CM | POA: Diagnosis not present

## 2021-01-19 DIAGNOSIS — G43909 Migraine, unspecified, not intractable, without status migrainosus: Secondary | ICD-10-CM | POA: Diagnosis not present

## 2021-01-19 DIAGNOSIS — I5030 Unspecified diastolic (congestive) heart failure: Secondary | ICD-10-CM | POA: Diagnosis not present

## 2021-01-19 DIAGNOSIS — M5030 Other cervical disc degeneration, unspecified cervical region: Secondary | ICD-10-CM | POA: Diagnosis not present

## 2021-01-19 DIAGNOSIS — Z8673 Personal history of transient ischemic attack (TIA), and cerebral infarction without residual deficits: Secondary | ICD-10-CM | POA: Diagnosis not present

## 2021-01-19 DIAGNOSIS — M419 Scoliosis, unspecified: Secondary | ICD-10-CM | POA: Diagnosis not present

## 2021-01-19 DIAGNOSIS — I2510 Atherosclerotic heart disease of native coronary artery without angina pectoris: Secondary | ICD-10-CM | POA: Diagnosis not present

## 2021-01-19 DIAGNOSIS — Z9181 History of falling: Secondary | ICD-10-CM | POA: Diagnosis not present

## 2021-01-19 DIAGNOSIS — G2581 Restless legs syndrome: Secondary | ICD-10-CM | POA: Diagnosis not present

## 2021-01-19 DIAGNOSIS — Z79899 Other long term (current) drug therapy: Secondary | ICD-10-CM | POA: Diagnosis not present

## 2021-01-19 DIAGNOSIS — M1990 Unspecified osteoarthritis, unspecified site: Secondary | ICD-10-CM | POA: Diagnosis not present

## 2021-01-19 DIAGNOSIS — E7849 Other hyperlipidemia: Secondary | ICD-10-CM | POA: Diagnosis not present

## 2021-01-19 DIAGNOSIS — K219 Gastro-esophageal reflux disease without esophagitis: Secondary | ICD-10-CM | POA: Diagnosis not present

## 2021-01-19 DIAGNOSIS — K589 Irritable bowel syndrome without diarrhea: Secondary | ICD-10-CM | POA: Diagnosis not present

## 2021-01-19 DIAGNOSIS — M810 Age-related osteoporosis without current pathological fracture: Secondary | ICD-10-CM | POA: Diagnosis not present

## 2021-01-19 DIAGNOSIS — M2170 Unequal limb length (acquired), unspecified site: Secondary | ICD-10-CM | POA: Diagnosis not present

## 2021-01-19 DIAGNOSIS — K5900 Constipation, unspecified: Secondary | ICD-10-CM | POA: Diagnosis not present

## 2021-01-19 DIAGNOSIS — G4733 Obstructive sleep apnea (adult) (pediatric): Secondary | ICD-10-CM | POA: Diagnosis not present

## 2021-01-22 DIAGNOSIS — C44729 Squamous cell carcinoma of skin of left lower limb, including hip: Secondary | ICD-10-CM | POA: Diagnosis not present

## 2021-01-22 DIAGNOSIS — D485 Neoplasm of uncertain behavior of skin: Secondary | ICD-10-CM | POA: Diagnosis not present

## 2021-01-22 DIAGNOSIS — C44722 Squamous cell carcinoma of skin of right lower limb, including hip: Secondary | ICD-10-CM | POA: Diagnosis not present

## 2021-01-24 ENCOUNTER — Telehealth: Payer: Self-pay | Admitting: Family Medicine

## 2021-01-24 NOTE — Telephone Encounter (Signed)
error 

## 2021-01-25 DIAGNOSIS — G43909 Migraine, unspecified, not intractable, without status migrainosus: Secondary | ICD-10-CM | POA: Diagnosis not present

## 2021-01-25 DIAGNOSIS — M2170 Unequal limb length (acquired), unspecified site: Secondary | ICD-10-CM | POA: Diagnosis not present

## 2021-01-25 DIAGNOSIS — Z9181 History of falling: Secondary | ICD-10-CM | POA: Diagnosis not present

## 2021-01-25 DIAGNOSIS — K589 Irritable bowel syndrome without diarrhea: Secondary | ICD-10-CM | POA: Diagnosis not present

## 2021-01-25 DIAGNOSIS — I5030 Unspecified diastolic (congestive) heart failure: Secondary | ICD-10-CM | POA: Diagnosis not present

## 2021-01-25 DIAGNOSIS — Z8673 Personal history of transient ischemic attack (TIA), and cerebral infarction without residual deficits: Secondary | ICD-10-CM | POA: Diagnosis not present

## 2021-01-25 DIAGNOSIS — M1990 Unspecified osteoarthritis, unspecified site: Secondary | ICD-10-CM | POA: Diagnosis not present

## 2021-01-25 DIAGNOSIS — E7849 Other hyperlipidemia: Secondary | ICD-10-CM | POA: Diagnosis not present

## 2021-01-25 DIAGNOSIS — K5900 Constipation, unspecified: Secondary | ICD-10-CM | POA: Diagnosis not present

## 2021-01-25 DIAGNOSIS — G2581 Restless legs syndrome: Secondary | ICD-10-CM | POA: Diagnosis not present

## 2021-01-25 DIAGNOSIS — I2510 Atherosclerotic heart disease of native coronary artery without angina pectoris: Secondary | ICD-10-CM | POA: Diagnosis not present

## 2021-01-25 DIAGNOSIS — M5030 Other cervical disc degeneration, unspecified cervical region: Secondary | ICD-10-CM | POA: Diagnosis not present

## 2021-01-25 DIAGNOSIS — K219 Gastro-esophageal reflux disease without esophagitis: Secondary | ICD-10-CM | POA: Diagnosis not present

## 2021-01-25 DIAGNOSIS — M810 Age-related osteoporosis without current pathological fracture: Secondary | ICD-10-CM | POA: Diagnosis not present

## 2021-01-25 DIAGNOSIS — Z79899 Other long term (current) drug therapy: Secondary | ICD-10-CM | POA: Diagnosis not present

## 2021-01-25 DIAGNOSIS — M419 Scoliosis, unspecified: Secondary | ICD-10-CM | POA: Diagnosis not present

## 2021-01-25 DIAGNOSIS — G4733 Obstructive sleep apnea (adult) (pediatric): Secondary | ICD-10-CM | POA: Diagnosis not present

## 2021-01-31 DIAGNOSIS — M2170 Unequal limb length (acquired), unspecified site: Secondary | ICD-10-CM | POA: Diagnosis not present

## 2021-01-31 DIAGNOSIS — G43909 Migraine, unspecified, not intractable, without status migrainosus: Secondary | ICD-10-CM | POA: Diagnosis not present

## 2021-01-31 DIAGNOSIS — Z8673 Personal history of transient ischemic attack (TIA), and cerebral infarction without residual deficits: Secondary | ICD-10-CM | POA: Diagnosis not present

## 2021-01-31 DIAGNOSIS — M5030 Other cervical disc degeneration, unspecified cervical region: Secondary | ICD-10-CM | POA: Diagnosis not present

## 2021-01-31 DIAGNOSIS — G4733 Obstructive sleep apnea (adult) (pediatric): Secondary | ICD-10-CM | POA: Diagnosis not present

## 2021-01-31 DIAGNOSIS — Z79899 Other long term (current) drug therapy: Secondary | ICD-10-CM | POA: Diagnosis not present

## 2021-01-31 DIAGNOSIS — M810 Age-related osteoporosis without current pathological fracture: Secondary | ICD-10-CM | POA: Diagnosis not present

## 2021-01-31 DIAGNOSIS — E7849 Other hyperlipidemia: Secondary | ICD-10-CM | POA: Diagnosis not present

## 2021-01-31 DIAGNOSIS — I2510 Atherosclerotic heart disease of native coronary artery without angina pectoris: Secondary | ICD-10-CM | POA: Diagnosis not present

## 2021-01-31 DIAGNOSIS — K589 Irritable bowel syndrome without diarrhea: Secondary | ICD-10-CM | POA: Diagnosis not present

## 2021-01-31 DIAGNOSIS — Z9181 History of falling: Secondary | ICD-10-CM | POA: Diagnosis not present

## 2021-01-31 DIAGNOSIS — I5030 Unspecified diastolic (congestive) heart failure: Secondary | ICD-10-CM | POA: Diagnosis not present

## 2021-01-31 DIAGNOSIS — M419 Scoliosis, unspecified: Secondary | ICD-10-CM | POA: Diagnosis not present

## 2021-01-31 DIAGNOSIS — G2581 Restless legs syndrome: Secondary | ICD-10-CM | POA: Diagnosis not present

## 2021-01-31 DIAGNOSIS — K5900 Constipation, unspecified: Secondary | ICD-10-CM | POA: Diagnosis not present

## 2021-01-31 DIAGNOSIS — K219 Gastro-esophageal reflux disease without esophagitis: Secondary | ICD-10-CM | POA: Diagnosis not present

## 2021-01-31 DIAGNOSIS — M1990 Unspecified osteoarthritis, unspecified site: Secondary | ICD-10-CM | POA: Diagnosis not present

## 2021-02-01 DIAGNOSIS — Z9181 History of falling: Secondary | ICD-10-CM | POA: Diagnosis not present

## 2021-02-01 DIAGNOSIS — K219 Gastro-esophageal reflux disease without esophagitis: Secondary | ICD-10-CM | POA: Diagnosis not present

## 2021-02-01 DIAGNOSIS — G43909 Migraine, unspecified, not intractable, without status migrainosus: Secondary | ICD-10-CM | POA: Diagnosis not present

## 2021-02-01 DIAGNOSIS — G2581 Restless legs syndrome: Secondary | ICD-10-CM | POA: Diagnosis not present

## 2021-02-01 DIAGNOSIS — M810 Age-related osteoporosis without current pathological fracture: Secondary | ICD-10-CM | POA: Diagnosis not present

## 2021-02-01 DIAGNOSIS — Z8673 Personal history of transient ischemic attack (TIA), and cerebral infarction without residual deficits: Secondary | ICD-10-CM | POA: Diagnosis not present

## 2021-02-01 DIAGNOSIS — K589 Irritable bowel syndrome without diarrhea: Secondary | ICD-10-CM | POA: Diagnosis not present

## 2021-02-01 DIAGNOSIS — Z79899 Other long term (current) drug therapy: Secondary | ICD-10-CM | POA: Diagnosis not present

## 2021-02-01 DIAGNOSIS — M5030 Other cervical disc degeneration, unspecified cervical region: Secondary | ICD-10-CM | POA: Diagnosis not present

## 2021-02-01 DIAGNOSIS — I2510 Atherosclerotic heart disease of native coronary artery without angina pectoris: Secondary | ICD-10-CM | POA: Diagnosis not present

## 2021-02-01 DIAGNOSIS — M419 Scoliosis, unspecified: Secondary | ICD-10-CM | POA: Diagnosis not present

## 2021-02-01 DIAGNOSIS — E7849 Other hyperlipidemia: Secondary | ICD-10-CM | POA: Diagnosis not present

## 2021-02-01 DIAGNOSIS — M1990 Unspecified osteoarthritis, unspecified site: Secondary | ICD-10-CM | POA: Diagnosis not present

## 2021-02-01 DIAGNOSIS — M2170 Unequal limb length (acquired), unspecified site: Secondary | ICD-10-CM | POA: Diagnosis not present

## 2021-02-01 DIAGNOSIS — K5900 Constipation, unspecified: Secondary | ICD-10-CM | POA: Diagnosis not present

## 2021-02-01 DIAGNOSIS — I5030 Unspecified diastolic (congestive) heart failure: Secondary | ICD-10-CM | POA: Diagnosis not present

## 2021-02-01 DIAGNOSIS — G4733 Obstructive sleep apnea (adult) (pediatric): Secondary | ICD-10-CM | POA: Diagnosis not present

## 2021-02-06 DIAGNOSIS — M1990 Unspecified osteoarthritis, unspecified site: Secondary | ICD-10-CM | POA: Diagnosis not present

## 2021-02-06 DIAGNOSIS — Z9181 History of falling: Secondary | ICD-10-CM | POA: Diagnosis not present

## 2021-02-06 DIAGNOSIS — G2581 Restless legs syndrome: Secondary | ICD-10-CM | POA: Diagnosis not present

## 2021-02-06 DIAGNOSIS — M419 Scoliosis, unspecified: Secondary | ICD-10-CM | POA: Diagnosis not present

## 2021-02-06 DIAGNOSIS — M2170 Unequal limb length (acquired), unspecified site: Secondary | ICD-10-CM | POA: Diagnosis not present

## 2021-02-06 DIAGNOSIS — Z79899 Other long term (current) drug therapy: Secondary | ICD-10-CM | POA: Diagnosis not present

## 2021-02-06 DIAGNOSIS — Z8673 Personal history of transient ischemic attack (TIA), and cerebral infarction without residual deficits: Secondary | ICD-10-CM | POA: Diagnosis not present

## 2021-02-06 DIAGNOSIS — M810 Age-related osteoporosis without current pathological fracture: Secondary | ICD-10-CM | POA: Diagnosis not present

## 2021-02-06 DIAGNOSIS — G43909 Migraine, unspecified, not intractable, without status migrainosus: Secondary | ICD-10-CM | POA: Diagnosis not present

## 2021-02-06 DIAGNOSIS — G4733 Obstructive sleep apnea (adult) (pediatric): Secondary | ICD-10-CM | POA: Diagnosis not present

## 2021-02-06 DIAGNOSIS — M5030 Other cervical disc degeneration, unspecified cervical region: Secondary | ICD-10-CM | POA: Diagnosis not present

## 2021-02-06 DIAGNOSIS — I2510 Atherosclerotic heart disease of native coronary artery without angina pectoris: Secondary | ICD-10-CM | POA: Diagnosis not present

## 2021-02-06 DIAGNOSIS — K5900 Constipation, unspecified: Secondary | ICD-10-CM | POA: Diagnosis not present

## 2021-02-06 DIAGNOSIS — K219 Gastro-esophageal reflux disease without esophagitis: Secondary | ICD-10-CM | POA: Diagnosis not present

## 2021-02-06 DIAGNOSIS — E7849 Other hyperlipidemia: Secondary | ICD-10-CM | POA: Diagnosis not present

## 2021-02-06 DIAGNOSIS — I5030 Unspecified diastolic (congestive) heart failure: Secondary | ICD-10-CM | POA: Diagnosis not present

## 2021-02-06 DIAGNOSIS — K589 Irritable bowel syndrome without diarrhea: Secondary | ICD-10-CM | POA: Diagnosis not present

## 2021-02-09 DIAGNOSIS — G43909 Migraine, unspecified, not intractable, without status migrainosus: Secondary | ICD-10-CM | POA: Diagnosis not present

## 2021-02-09 DIAGNOSIS — Z9181 History of falling: Secondary | ICD-10-CM | POA: Diagnosis not present

## 2021-02-09 DIAGNOSIS — M5030 Other cervical disc degeneration, unspecified cervical region: Secondary | ICD-10-CM | POA: Diagnosis not present

## 2021-02-09 DIAGNOSIS — M419 Scoliosis, unspecified: Secondary | ICD-10-CM | POA: Diagnosis not present

## 2021-02-09 DIAGNOSIS — Z8673 Personal history of transient ischemic attack (TIA), and cerebral infarction without residual deficits: Secondary | ICD-10-CM | POA: Diagnosis not present

## 2021-02-09 DIAGNOSIS — I2510 Atherosclerotic heart disease of native coronary artery without angina pectoris: Secondary | ICD-10-CM | POA: Diagnosis not present

## 2021-02-09 DIAGNOSIS — E7849 Other hyperlipidemia: Secondary | ICD-10-CM | POA: Diagnosis not present

## 2021-02-09 DIAGNOSIS — K5900 Constipation, unspecified: Secondary | ICD-10-CM | POA: Diagnosis not present

## 2021-02-09 DIAGNOSIS — G2581 Restless legs syndrome: Secondary | ICD-10-CM | POA: Diagnosis not present

## 2021-02-09 DIAGNOSIS — G4733 Obstructive sleep apnea (adult) (pediatric): Secondary | ICD-10-CM | POA: Diagnosis not present

## 2021-02-09 DIAGNOSIS — M2170 Unequal limb length (acquired), unspecified site: Secondary | ICD-10-CM | POA: Diagnosis not present

## 2021-02-09 DIAGNOSIS — Z79899 Other long term (current) drug therapy: Secondary | ICD-10-CM | POA: Diagnosis not present

## 2021-02-09 DIAGNOSIS — K219 Gastro-esophageal reflux disease without esophagitis: Secondary | ICD-10-CM | POA: Diagnosis not present

## 2021-02-09 DIAGNOSIS — M810 Age-related osteoporosis without current pathological fracture: Secondary | ICD-10-CM | POA: Diagnosis not present

## 2021-02-09 DIAGNOSIS — K589 Irritable bowel syndrome without diarrhea: Secondary | ICD-10-CM | POA: Diagnosis not present

## 2021-02-09 DIAGNOSIS — M1990 Unspecified osteoarthritis, unspecified site: Secondary | ICD-10-CM | POA: Diagnosis not present

## 2021-02-09 DIAGNOSIS — I5030 Unspecified diastolic (congestive) heart failure: Secondary | ICD-10-CM | POA: Diagnosis not present

## 2021-02-13 DIAGNOSIS — G43909 Migraine, unspecified, not intractable, without status migrainosus: Secondary | ICD-10-CM | POA: Diagnosis not present

## 2021-02-13 DIAGNOSIS — Z8673 Personal history of transient ischemic attack (TIA), and cerebral infarction without residual deficits: Secondary | ICD-10-CM | POA: Diagnosis not present

## 2021-02-13 DIAGNOSIS — G2581 Restless legs syndrome: Secondary | ICD-10-CM | POA: Diagnosis not present

## 2021-02-13 DIAGNOSIS — K219 Gastro-esophageal reflux disease without esophagitis: Secondary | ICD-10-CM | POA: Diagnosis not present

## 2021-02-13 DIAGNOSIS — M2170 Unequal limb length (acquired), unspecified site: Secondary | ICD-10-CM | POA: Diagnosis not present

## 2021-02-13 DIAGNOSIS — K589 Irritable bowel syndrome without diarrhea: Secondary | ICD-10-CM | POA: Diagnosis not present

## 2021-02-13 DIAGNOSIS — M1990 Unspecified osteoarthritis, unspecified site: Secondary | ICD-10-CM | POA: Diagnosis not present

## 2021-02-13 DIAGNOSIS — M5030 Other cervical disc degeneration, unspecified cervical region: Secondary | ICD-10-CM | POA: Diagnosis not present

## 2021-02-13 DIAGNOSIS — G4733 Obstructive sleep apnea (adult) (pediatric): Secondary | ICD-10-CM | POA: Diagnosis not present

## 2021-02-13 DIAGNOSIS — M810 Age-related osteoporosis without current pathological fracture: Secondary | ICD-10-CM | POA: Diagnosis not present

## 2021-02-13 DIAGNOSIS — M419 Scoliosis, unspecified: Secondary | ICD-10-CM | POA: Diagnosis not present

## 2021-02-13 DIAGNOSIS — Z9181 History of falling: Secondary | ICD-10-CM | POA: Diagnosis not present

## 2021-02-13 DIAGNOSIS — Z79899 Other long term (current) drug therapy: Secondary | ICD-10-CM | POA: Diagnosis not present

## 2021-02-13 DIAGNOSIS — K5900 Constipation, unspecified: Secondary | ICD-10-CM | POA: Diagnosis not present

## 2021-02-13 DIAGNOSIS — I5030 Unspecified diastolic (congestive) heart failure: Secondary | ICD-10-CM | POA: Diagnosis not present

## 2021-02-13 DIAGNOSIS — I2510 Atherosclerotic heart disease of native coronary artery without angina pectoris: Secondary | ICD-10-CM | POA: Diagnosis not present

## 2021-02-13 DIAGNOSIS — E7849 Other hyperlipidemia: Secondary | ICD-10-CM | POA: Diagnosis not present

## 2021-02-14 DIAGNOSIS — G4733 Obstructive sleep apnea (adult) (pediatric): Secondary | ICD-10-CM | POA: Diagnosis not present

## 2021-02-14 DIAGNOSIS — M419 Scoliosis, unspecified: Secondary | ICD-10-CM | POA: Diagnosis not present

## 2021-02-14 DIAGNOSIS — G43909 Migraine, unspecified, not intractable, without status migrainosus: Secondary | ICD-10-CM | POA: Diagnosis not present

## 2021-02-14 DIAGNOSIS — M2170 Unequal limb length (acquired), unspecified site: Secondary | ICD-10-CM | POA: Diagnosis not present

## 2021-02-14 DIAGNOSIS — G2581 Restless legs syndrome: Secondary | ICD-10-CM | POA: Diagnosis not present

## 2021-02-14 DIAGNOSIS — Z79899 Other long term (current) drug therapy: Secondary | ICD-10-CM | POA: Diagnosis not present

## 2021-02-14 DIAGNOSIS — I5030 Unspecified diastolic (congestive) heart failure: Secondary | ICD-10-CM | POA: Diagnosis not present

## 2021-02-14 DIAGNOSIS — K589 Irritable bowel syndrome without diarrhea: Secondary | ICD-10-CM | POA: Diagnosis not present

## 2021-02-14 DIAGNOSIS — M1990 Unspecified osteoarthritis, unspecified site: Secondary | ICD-10-CM | POA: Diagnosis not present

## 2021-02-14 DIAGNOSIS — Z8673 Personal history of transient ischemic attack (TIA), and cerebral infarction without residual deficits: Secondary | ICD-10-CM | POA: Diagnosis not present

## 2021-02-14 DIAGNOSIS — K219 Gastro-esophageal reflux disease without esophagitis: Secondary | ICD-10-CM | POA: Diagnosis not present

## 2021-02-14 DIAGNOSIS — M5030 Other cervical disc degeneration, unspecified cervical region: Secondary | ICD-10-CM | POA: Diagnosis not present

## 2021-02-14 DIAGNOSIS — K5900 Constipation, unspecified: Secondary | ICD-10-CM | POA: Diagnosis not present

## 2021-02-14 DIAGNOSIS — Z9181 History of falling: Secondary | ICD-10-CM | POA: Diagnosis not present

## 2021-02-14 DIAGNOSIS — M810 Age-related osteoporosis without current pathological fracture: Secondary | ICD-10-CM | POA: Diagnosis not present

## 2021-02-14 DIAGNOSIS — E7849 Other hyperlipidemia: Secondary | ICD-10-CM | POA: Diagnosis not present

## 2021-02-14 DIAGNOSIS — I2510 Atherosclerotic heart disease of native coronary artery without angina pectoris: Secondary | ICD-10-CM | POA: Diagnosis not present

## 2021-02-15 ENCOUNTER — Other Ambulatory Visit: Payer: Self-pay

## 2021-02-15 ENCOUNTER — Ambulatory Visit: Payer: Medicare Other | Admitting: Internal Medicine

## 2021-02-16 ENCOUNTER — Encounter: Payer: Self-pay | Admitting: Family Medicine

## 2021-02-16 ENCOUNTER — Telehealth: Payer: Self-pay | Admitting: Family Medicine

## 2021-02-16 ENCOUNTER — Other Ambulatory Visit: Payer: Self-pay

## 2021-02-16 ENCOUNTER — Ambulatory Visit (INDEPENDENT_AMBULATORY_CARE_PROVIDER_SITE_OTHER): Payer: Medicare Other | Admitting: Family Medicine

## 2021-02-16 VITALS — BP 120/60 | HR 77 | Temp 97.5°F | Wt 132.3 lb

## 2021-02-16 DIAGNOSIS — G2581 Restless legs syndrome: Secondary | ICD-10-CM | POA: Diagnosis not present

## 2021-02-16 DIAGNOSIS — L299 Pruritus, unspecified: Secondary | ICD-10-CM

## 2021-02-16 DIAGNOSIS — R21 Rash and other nonspecific skin eruption: Secondary | ICD-10-CM

## 2021-02-16 DIAGNOSIS — R058 Other specified cough: Secondary | ICD-10-CM | POA: Diagnosis not present

## 2021-02-16 MED ORDER — FLUOCINOLONE ACETONIDE 0.01 % EX SHAM: MEDICATED_SHAMPOO | CUTANEOUS | 1 refills | Status: DC

## 2021-02-16 MED ORDER — MOMETASONE FUROATE 0.1 % EX SOLN: CUTANEOUS | 0 refills | Status: DC

## 2021-02-16 NOTE — Telephone Encounter (Signed)
[facility] [place]. Called patient to make aware, patient was asleep spoke with husband

## 2021-02-16 NOTE — Progress Notes (Signed)
Established Patient Office Visit  Subjective:  Patient ID: Briana Fitzgerald, female    DOB: 11-09-1931  Age: 87 y.o. MRN: [id number]  CC:  Chief Complaint  Patient presents with  . Rash    Rash on scalp and back, x 2 weeks, had gotten better but has now returned, very itchy    HPI Briana Fitzgerald presents for follow-up regarding several items  Diffuse pruritus of scalp for the past couple weeks.  No relief with over-the-counter shampoos.  Pruritus is severe at times.  Her itching in the scalp is diffuse.  Briana Fitzgerald has a rash on her back mostly lower back but also mid back region bilaterally.  For started couple weeks ago.  No recent fevers or chills.  No generalized rash.  Had recent issues with some shoulder pain and generalized weakness and increased risk for falls.  We set up home PT and Briana Fitzgerald thinks this is helping.  Briana Fitzgerald is doing strengthening exercises for her upper and lower extremities.  Briana Fitzgerald had some cough and frequent clearing of throat.  Never started Prilosec but Briana Fitzgerald states symptoms are improving  Briana Fitzgerald brings in her bottle of Mirapex which Briana Fitzgerald takes for restless legs.  Apparently there was a mixup at the pharmacy and this was written for one tablet 3 times daily- and not one qhs which is the way it was sent in.  Briana Fitzgerald was asking for clarification.  Restless leg symptoms are improved on Mirapex.    Past Medical History:  Diagnosis Date  . Arthritis   . BACK PAIN 07/18/2008  . Bowel obstruction ([place])   . CONSTIPATION 10/02/2010  . GERD 10/02/2010  . Headache   . IBS (irritable bowel syndrome)   . LAMINECTOMY, LUMBAR, HX OF 06/16/2008  . OSTEOARTHRITIS 10/02/2010  . Pneumonia   . RESTLESS LEG SYNDROME 10/02/2010  . SACROILIAC JOINT DYSFUNCTION 06/16/2008  . Sleep apnea   . STYE 10/02/2010  . TRANSIENT ISCHEMIC ATTACKS, HX OF 10/02/2010    Past Surgical History:  Procedure Laterality Date  . ABDOMINAL HYSTERECTOMY  1979   TAH, fibroids  . APOGEE / [place]  2005  .  APPENDECTOMY  1941  . EYE SURGERY  2005   catarac,both eyes  . LIVER BIOPSY  1997   benign, hemangioma resection  . SPINE SURGERY  2006   laminectomy    Family History  Problem Relation Age of Onset  . Stroke Father   . Diabetes Father        type ll  . Pancreatitis Brother   . Breast cancer Paternal Aunt        aunt  . Colon cancer Neg Hx   . Stomach cancer Neg Hx   . Rectal cancer Neg Hx   . Liver cancer Neg Hx   . Esophageal cancer Neg Hx     Social History   Socioeconomic History  . Marital status: Married    Spouse name: Not on file  . Number of children: 2  . Years of education: Not on file  . Highest education level: Not on file  Occupational History  . Occupation: retired  Tobacco Use  . Smoking status: Never Smoker  . Smokeless tobacco: Never Used  Vaping Use  . Vaping Use: Never used  Substance and Sexual Activity  . Alcohol use: Yes    Comment: at dinner with friends, 2-3 per month - wine  . Drug use: No  . Sexual activity: Not on file  Other Topics Concern  .  Not on file  Social History Narrative  . Not on file   Social Determinants of Health   Financial Resource Strain: Low Risk   . Difficulty of Paying Living Expenses: Not very hard  Food Insecurity: Not on file  Transportation Needs: No Transportation Needs  . Lack of Transportation (Medical): No  . Lack of Transportation (Non-Medical): No  Physical Activity: Not on file  Stress: Not on file  Social Connections: Not on file  Intimate Partner Violence: Not on file    Outpatient Medications Prior to Visit  Medication Sig Dispense Refill  . acetaminophen (TYLENOL) 500 MG tablet Take 500 mg by mouth every 6 (six) hours as needed.    . Calcium Carbonate-Vitamin D 600-400 MG-UNIT tablet Take 1 tablet by mouth daily.    . ferrous sulfate 324 MG TBEC Take 324 mg by mouth 2 (two) times a week.    . furosemide (LASIX) 40 MG tablet TAKE 1 TABLET BY MOUTH EVERY DAY 90 tablet 1  . Misc Natural  Products (TART CHERRY ADVANCED PO) Take 1,200 mg by mouth daily.    . Omega-3 Fatty Acids (SALMON OIL-1000 PO) Take 1 tablet by mouth daily.    . pramipexole (MIRAPEX) 0.5 MG tablet Take 1 tablet (0.5 mg total) by mouth at bedtime. 90 tablet 1  . pyridoxine (B-6) 100 MG tablet Take 100 mg by mouth daily.    . rosuvastatin (CRESTOR) 5 MG tablet TAKE 1 TABLET BY MOUTH DAILY.MAKE YEARLY APPT WITH DR. Marlou Porch FOR Brown Human 2022 FOR FUTURE REFILLS 90 tablet 3  . Ubrogepant (UBRELVY) 50 MG TABS Take one tablet by mouth as needed for migraine headache.  May repeat dose times one after 2 hours as needed for ongoing headache. 12 tablet 5  . vitamin B-12 (CYANOCOBALAMIN) 500 MCG tablet Take 500 mcg by mouth daily.    . vitamin C (ASCORBIC ACID) 500 MG tablet Take 500 mg by mouth 2 (two) times a week.     . vitamin E 400 UNIT capsule Take 400 Units by mouth daily.     No facility-administered medications prior to visit.    No Known Allergies  ROS Review of Systems  Constitutional: Negative for chills and fever.  Respiratory: Negative for shortness of breath.   Cardiovascular: Negative for chest pain.  Gastrointestinal: Negative for abdominal pain.  Skin: Positive for rash.  Psychiatric/Behavioral: Negative for dysphoric mood.      Objective:    Physical Exam Vitals reviewed.  Constitutional:      Appearance: Normal appearance.  Cardiovascular:     Rate and Rhythm: Normal rate.  Pulmonary:     Effort: Pulmonary effort is normal.     Breath sounds: Normal breath sounds.  Skin:    Comments: Briana Fitzgerald has some mild dryness of scalp with minimal scaling.  Lower back - scattered small erythematous papules.  No pustules.   Non-tender.    Neurological:     Mental Status: Briana Fitzgerald is alert.     BP 120/60 (BP Location: Left Arm, Patient Position: Sitting, Cuff Size: Normal)   Pulse 77   Temp (!) 97.5 F (36.4 C) (Oral)   Wt 132 lb 4.8 oz (60 kg)   SpO2 96%   BMI 23.07 kg/m  Wt Readings from Last 3  Encounters:  02/16/21 132 lb 4.8 oz (60 kg)  12/26/20 134 lb 1.6 oz (60.8 kg)  05/01/20 134 lb (60.8 kg)     Health Maintenance Due  Topic Date Due  . Pneumococcal Vaccine  52-63 Years old (1 of 4 - PCV13) Never done  . TETANUS/TDAP  Never done  . Zoster Vaccines- Shingrix (1 of 2) Never done  . COVID-19 Vaccine (3 - Pfizer risk 4-dose series) 12/01/2019    There are no preventive care reminders to display for this patient.  Lab Results  Component Value Date   TSH 2.98 12/26/2020   Lab Results  Component Value Date   WBC 6.0 12/26/2020   HGB 13.2 12/26/2020   HCT 39.3 12/26/2020   MCV 82.5 12/26/2020   PLT 155.0 12/26/2020   Lab Results  Component Value Date   NA 139 12/26/2020   K 3.9 12/26/2020   CHLORIDE 106 10/15/2016   CO2 28 12/26/2020   GLUCOSE 86 12/26/2020   BUN 14 12/26/2020   CREATININE 0.72 12/26/2020   BILITOT 1.4 (H) 12/26/2020   ALKPHOS 63 12/26/2020   AST 18 12/26/2020   ALT 13 12/26/2020   PROT 6.5 12/26/2020   ALBUMIN 3.7 12/26/2020   CALCIUM 10.2 12/26/2020   ANIONGAP 6 07/24/2018   EGFR 58 (L) 10/15/2016   GFR 75.43 12/26/2020   Lab Results  Component Value Date   CHOL 67 12/26/2020   Lab Results  Component Value Date   HDL 35.10 (L) 12/26/2020   Lab Results  Component Value Date   LDLCALC 21 12/26/2020   Lab Results  Component Value Date   TRIG 55.0 12/26/2020   Lab Results  Component Value Date   CHOLHDL 2 12/26/2020   No results found for: HGBA1C    Assessment & Plan:   #1 scalp pruritus.  Briana Fitzgerald has some mild flaking.  Briana Fitzgerald has tried antiseborrheic shampoos without improvement  -Trial of [place]x prescription shampoo once daily as needed for pruritus symptoms.  Briana Fitzgerald is aware cost may be prohibitive  #2 nonspecific dermatitis lower back region  -Triamcinolone 0.1% cream twice daily as needed and liberal use of moisturizers especially after bathing  #3 left shoulder pain improved with physical therapy  #4 restless leg  syndrome.  We clarified her medication.  We reiterated that Briana Fitzgerald is supposed to be on Mirapex 0.5 mg nightly and not 3 times daily.  #5 upper airway cough syndrome.  Currently stable.  Get back on PPI for recurrent symptoms.   Meds ordered this encounter  Medications  . Fluocinolone Acetonide 0.01 % SHAM    Sig: Apply topically to scalp once daily as needed for scalp pruritis and leave on for 5 minutes and then rinse off    Dispense:  120 mL    Refill:  1    Follow-up: No follow-ups on file.    Carolann Littler, MD

## 2021-02-16 NOTE — Telephone Encounter (Signed)
Pt call and stated the medication Flucinolone cost over a 100.00 and she can pay for it .she stated her Ins want cover it and she want something that she can pay for call in.She want it call in today if you can.

## 2021-02-16 NOTE — Telephone Encounter (Signed)
Fluocinolone Acetonide 0.01% shampoo.

## 2021-02-20 DIAGNOSIS — M810 Age-related osteoporosis without current pathological fracture: Secondary | ICD-10-CM | POA: Diagnosis not present

## 2021-02-20 DIAGNOSIS — K589 Irritable bowel syndrome without diarrhea: Secondary | ICD-10-CM | POA: Diagnosis not present

## 2021-02-20 DIAGNOSIS — I5030 Unspecified diastolic (congestive) heart failure: Secondary | ICD-10-CM | POA: Diagnosis not present

## 2021-02-20 DIAGNOSIS — Z9181 History of falling: Secondary | ICD-10-CM | POA: Diagnosis not present

## 2021-02-20 DIAGNOSIS — Z8673 Personal history of transient ischemic attack (TIA), and cerebral infarction without residual deficits: Secondary | ICD-10-CM | POA: Diagnosis not present

## 2021-02-20 DIAGNOSIS — Z79899 Other long term (current) drug therapy: Secondary | ICD-10-CM | POA: Diagnosis not present

## 2021-02-20 DIAGNOSIS — G4733 Obstructive sleep apnea (adult) (pediatric): Secondary | ICD-10-CM | POA: Diagnosis not present

## 2021-02-20 DIAGNOSIS — I2510 Atherosclerotic heart disease of native coronary artery without angina pectoris: Secondary | ICD-10-CM | POA: Diagnosis not present

## 2021-02-20 DIAGNOSIS — M419 Scoliosis, unspecified: Secondary | ICD-10-CM | POA: Diagnosis not present

## 2021-02-20 DIAGNOSIS — M5030 Other cervical disc degeneration, unspecified cervical region: Secondary | ICD-10-CM | POA: Diagnosis not present

## 2021-02-20 DIAGNOSIS — K5900 Constipation, unspecified: Secondary | ICD-10-CM | POA: Diagnosis not present

## 2021-02-20 DIAGNOSIS — G2581 Restless legs syndrome: Secondary | ICD-10-CM | POA: Diagnosis not present

## 2021-02-20 DIAGNOSIS — M1990 Unspecified osteoarthritis, unspecified site: Secondary | ICD-10-CM | POA: Diagnosis not present

## 2021-02-20 DIAGNOSIS — M2170 Unequal limb length (acquired), unspecified site: Secondary | ICD-10-CM | POA: Diagnosis not present

## 2021-02-20 DIAGNOSIS — E7849 Other hyperlipidemia: Secondary | ICD-10-CM | POA: Diagnosis not present

## 2021-02-20 DIAGNOSIS — K219 Gastro-esophageal reflux disease without esophagitis: Secondary | ICD-10-CM | POA: Diagnosis not present

## 2021-02-20 DIAGNOSIS — G43909 Migraine, unspecified, not intractable, without status migrainosus: Secondary | ICD-10-CM | POA: Diagnosis not present

## 2021-02-23 DIAGNOSIS — M419 Scoliosis, unspecified: Secondary | ICD-10-CM | POA: Diagnosis not present

## 2021-02-23 DIAGNOSIS — I2510 Atherosclerotic heart disease of native coronary artery without angina pectoris: Secondary | ICD-10-CM | POA: Diagnosis not present

## 2021-02-23 DIAGNOSIS — G4733 Obstructive sleep apnea (adult) (pediatric): Secondary | ICD-10-CM | POA: Diagnosis not present

## 2021-02-23 DIAGNOSIS — M5030 Other cervical disc degeneration, unspecified cervical region: Secondary | ICD-10-CM | POA: Diagnosis not present

## 2021-02-23 DIAGNOSIS — E7849 Other hyperlipidemia: Secondary | ICD-10-CM | POA: Diagnosis not present

## 2021-02-23 DIAGNOSIS — Z9181 History of falling: Secondary | ICD-10-CM | POA: Diagnosis not present

## 2021-02-23 DIAGNOSIS — M1990 Unspecified osteoarthritis, unspecified site: Secondary | ICD-10-CM | POA: Diagnosis not present

## 2021-02-23 DIAGNOSIS — I5030 Unspecified diastolic (congestive) heart failure: Secondary | ICD-10-CM | POA: Diagnosis not present

## 2021-02-23 DIAGNOSIS — Z79899 Other long term (current) drug therapy: Secondary | ICD-10-CM | POA: Diagnosis not present

## 2021-02-23 DIAGNOSIS — K5900 Constipation, unspecified: Secondary | ICD-10-CM | POA: Diagnosis not present

## 2021-02-23 DIAGNOSIS — Z8673 Personal history of transient ischemic attack (TIA), and cerebral infarction without residual deficits: Secondary | ICD-10-CM | POA: Diagnosis not present

## 2021-02-23 DIAGNOSIS — M810 Age-related osteoporosis without current pathological fracture: Secondary | ICD-10-CM | POA: Diagnosis not present

## 2021-02-23 DIAGNOSIS — G43909 Migraine, unspecified, not intractable, without status migrainosus: Secondary | ICD-10-CM | POA: Diagnosis not present

## 2021-02-23 DIAGNOSIS — M2170 Unequal limb length (acquired), unspecified site: Secondary | ICD-10-CM | POA: Diagnosis not present

## 2021-02-23 DIAGNOSIS — K219 Gastro-esophageal reflux disease without esophagitis: Secondary | ICD-10-CM | POA: Diagnosis not present

## 2021-02-23 DIAGNOSIS — G2581 Restless legs syndrome: Secondary | ICD-10-CM | POA: Diagnosis not present

## 2021-02-23 DIAGNOSIS — K589 Irritable bowel syndrome without diarrhea: Secondary | ICD-10-CM | POA: Diagnosis not present

## 2021-02-26 ENCOUNTER — Telehealth: Payer: Self-pay | Admitting: Family Medicine

## 2021-02-26 NOTE — Telephone Encounter (Signed)
The patient seen Dr. Elease Hashimoto on 06/03 and was given medication that doesn't work and she has an itch all over her body and needs something else to help.

## 2021-02-26 NOTE — Telephone Encounter (Signed)
Please advise 

## 2021-02-26 NOTE — Telephone Encounter (Signed)
Spoke with the patients husband. He is aware of Dr. Anastasio Auerbach message below and will relay the message. Nothing further needed at this time.

## 2021-03-01 DIAGNOSIS — G4733 Obstructive sleep apnea (adult) (pediatric): Secondary | ICD-10-CM | POA: Diagnosis not present

## 2021-03-01 DIAGNOSIS — G43909 Migraine, unspecified, not intractable, without status migrainosus: Secondary | ICD-10-CM | POA: Diagnosis not present

## 2021-03-01 DIAGNOSIS — M419 Scoliosis, unspecified: Secondary | ICD-10-CM | POA: Diagnosis not present

## 2021-03-01 DIAGNOSIS — K589 Irritable bowel syndrome without diarrhea: Secondary | ICD-10-CM | POA: Diagnosis not present

## 2021-03-01 DIAGNOSIS — I2510 Atherosclerotic heart disease of native coronary artery without angina pectoris: Secondary | ICD-10-CM | POA: Diagnosis not present

## 2021-03-01 DIAGNOSIS — M5030 Other cervical disc degeneration, unspecified cervical region: Secondary | ICD-10-CM | POA: Diagnosis not present

## 2021-03-01 DIAGNOSIS — E7849 Other hyperlipidemia: Secondary | ICD-10-CM | POA: Diagnosis not present

## 2021-03-01 DIAGNOSIS — Z79899 Other long term (current) drug therapy: Secondary | ICD-10-CM | POA: Diagnosis not present

## 2021-03-01 DIAGNOSIS — M1990 Unspecified osteoarthritis, unspecified site: Secondary | ICD-10-CM | POA: Diagnosis not present

## 2021-03-01 DIAGNOSIS — M810 Age-related osteoporosis without current pathological fracture: Secondary | ICD-10-CM | POA: Diagnosis not present

## 2021-03-01 DIAGNOSIS — M2170 Unequal limb length (acquired), unspecified site: Secondary | ICD-10-CM | POA: Diagnosis not present

## 2021-03-01 DIAGNOSIS — K5900 Constipation, unspecified: Secondary | ICD-10-CM | POA: Diagnosis not present

## 2021-03-01 DIAGNOSIS — I5030 Unspecified diastolic (congestive) heart failure: Secondary | ICD-10-CM | POA: Diagnosis not present

## 2021-03-01 DIAGNOSIS — G2581 Restless legs syndrome: Secondary | ICD-10-CM | POA: Diagnosis not present

## 2021-03-01 DIAGNOSIS — K219 Gastro-esophageal reflux disease without esophagitis: Secondary | ICD-10-CM | POA: Diagnosis not present

## 2021-03-01 DIAGNOSIS — Z9181 History of falling: Secondary | ICD-10-CM | POA: Diagnosis not present

## 2021-03-01 DIAGNOSIS — Z8673 Personal history of transient ischemic attack (TIA), and cerebral infarction without residual deficits: Secondary | ICD-10-CM | POA: Diagnosis not present

## 2021-03-02 DIAGNOSIS — M2170 Unequal limb length (acquired), unspecified site: Secondary | ICD-10-CM | POA: Diagnosis not present

## 2021-03-02 DIAGNOSIS — M419 Scoliosis, unspecified: Secondary | ICD-10-CM | POA: Diagnosis not present

## 2021-03-02 DIAGNOSIS — Z79899 Other long term (current) drug therapy: Secondary | ICD-10-CM | POA: Diagnosis not present

## 2021-03-02 DIAGNOSIS — M5030 Other cervical disc degeneration, unspecified cervical region: Secondary | ICD-10-CM | POA: Diagnosis not present

## 2021-03-02 DIAGNOSIS — Z8673 Personal history of transient ischemic attack (TIA), and cerebral infarction without residual deficits: Secondary | ICD-10-CM | POA: Diagnosis not present

## 2021-03-02 DIAGNOSIS — G2581 Restless legs syndrome: Secondary | ICD-10-CM | POA: Diagnosis not present

## 2021-03-02 DIAGNOSIS — K219 Gastro-esophageal reflux disease without esophagitis: Secondary | ICD-10-CM | POA: Diagnosis not present

## 2021-03-02 DIAGNOSIS — M1990 Unspecified osteoarthritis, unspecified site: Secondary | ICD-10-CM | POA: Diagnosis not present

## 2021-03-02 DIAGNOSIS — I2510 Atherosclerotic heart disease of native coronary artery without angina pectoris: Secondary | ICD-10-CM | POA: Diagnosis not present

## 2021-03-02 DIAGNOSIS — G4733 Obstructive sleep apnea (adult) (pediatric): Secondary | ICD-10-CM | POA: Diagnosis not present

## 2021-03-02 DIAGNOSIS — G43909 Migraine, unspecified, not intractable, without status migrainosus: Secondary | ICD-10-CM | POA: Diagnosis not present

## 2021-03-02 DIAGNOSIS — M810 Age-related osteoporosis without current pathological fracture: Secondary | ICD-10-CM | POA: Diagnosis not present

## 2021-03-02 DIAGNOSIS — I5030 Unspecified diastolic (congestive) heart failure: Secondary | ICD-10-CM | POA: Diagnosis not present

## 2021-03-02 DIAGNOSIS — Z9181 History of falling: Secondary | ICD-10-CM | POA: Diagnosis not present

## 2021-03-02 DIAGNOSIS — K589 Irritable bowel syndrome without diarrhea: Secondary | ICD-10-CM | POA: Diagnosis not present

## 2021-03-02 DIAGNOSIS — E7849 Other hyperlipidemia: Secondary | ICD-10-CM | POA: Diagnosis not present

## 2021-03-02 DIAGNOSIS — K5900 Constipation, unspecified: Secondary | ICD-10-CM | POA: Diagnosis not present

## 2021-03-07 DIAGNOSIS — K589 Irritable bowel syndrome without diarrhea: Secondary | ICD-10-CM | POA: Diagnosis not present

## 2021-03-07 DIAGNOSIS — K5900 Constipation, unspecified: Secondary | ICD-10-CM | POA: Diagnosis not present

## 2021-03-07 DIAGNOSIS — M2170 Unequal limb length (acquired), unspecified site: Secondary | ICD-10-CM | POA: Diagnosis not present

## 2021-03-07 DIAGNOSIS — Z79899 Other long term (current) drug therapy: Secondary | ICD-10-CM | POA: Diagnosis not present

## 2021-03-07 DIAGNOSIS — Z8673 Personal history of transient ischemic attack (TIA), and cerebral infarction without residual deficits: Secondary | ICD-10-CM | POA: Diagnosis not present

## 2021-03-07 DIAGNOSIS — M5030 Other cervical disc degeneration, unspecified cervical region: Secondary | ICD-10-CM | POA: Diagnosis not present

## 2021-03-07 DIAGNOSIS — M419 Scoliosis, unspecified: Secondary | ICD-10-CM | POA: Diagnosis not present

## 2021-03-07 DIAGNOSIS — I2510 Atherosclerotic heart disease of native coronary artery without angina pectoris: Secondary | ICD-10-CM | POA: Diagnosis not present

## 2021-03-07 DIAGNOSIS — I5030 Unspecified diastolic (congestive) heart failure: Secondary | ICD-10-CM | POA: Diagnosis not present

## 2021-03-07 DIAGNOSIS — E7849 Other hyperlipidemia: Secondary | ICD-10-CM | POA: Diagnosis not present

## 2021-03-07 DIAGNOSIS — G2581 Restless legs syndrome: Secondary | ICD-10-CM | POA: Diagnosis not present

## 2021-03-07 DIAGNOSIS — G4733 Obstructive sleep apnea (adult) (pediatric): Secondary | ICD-10-CM | POA: Diagnosis not present

## 2021-03-07 DIAGNOSIS — M810 Age-related osteoporosis without current pathological fracture: Secondary | ICD-10-CM | POA: Diagnosis not present

## 2021-03-07 DIAGNOSIS — Z9181 History of falling: Secondary | ICD-10-CM | POA: Diagnosis not present

## 2021-03-07 DIAGNOSIS — K219 Gastro-esophageal reflux disease without esophagitis: Secondary | ICD-10-CM | POA: Diagnosis not present

## 2021-03-07 DIAGNOSIS — M1990 Unspecified osteoarthritis, unspecified site: Secondary | ICD-10-CM | POA: Diagnosis not present

## 2021-03-07 DIAGNOSIS — G43909 Migraine, unspecified, not intractable, without status migrainosus: Secondary | ICD-10-CM | POA: Diagnosis not present

## 2021-04-19 ENCOUNTER — Ambulatory Visit: Payer: Self-pay

## 2021-04-23 ENCOUNTER — Other Ambulatory Visit: Payer: Self-pay

## 2021-04-23 ENCOUNTER — Other Ambulatory Visit (HOSPITAL_BASED_OUTPATIENT_CLINIC_OR_DEPARTMENT_OTHER): Payer: Self-pay

## 2021-04-23 ENCOUNTER — Ambulatory Visit: Payer: Medicare Other | Attending: Internal Medicine

## 2021-04-23 DIAGNOSIS — Z23 Encounter for immunization: Secondary | ICD-10-CM

## 2021-04-23 MED ORDER — PFIZER-BIONT COVID-19 VAC-TRIS 30 MCG/0.3ML IM SUSP
INTRAMUSCULAR | 0 refills | Status: DC
  Filled 2021-04-23: qty 0.3, 1d supply, fill #0

## 2021-04-23 NOTE — Progress Notes (Signed)
   Covid-19 Vaccination Clinic  Name:  Briana Fitzgerald    MRN: [id number] DOB: 1932/07/08  04/23/2021  Briana Fitzgerald was observed post Covid-19 immunization for 15 minutes without incident. She was provided with Vaccine Information Sheet and instruction to access the V-Safe system.   Briana Fitzgerald was instructed to call 911 with any severe reactions post vaccine: Difficulty breathing  Swelling of face and throat  A fast heartbeat  A bad rash all over body  Dizziness and weakness   Immunizations Administered     Name Date Dose VIS Date Route   PFIZER Comrnaty(Gray TOP) Covid-19 Vaccine 04/23/2021  2:50 PM 0.3 mL 08/24/2020 Intramuscular   Manufacturer: [place]   Lot: [id number]   NDC: [phone]

## 2021-05-21 IMAGING — MR MR LUMBAR SPINE W/O CM
5 of 8 series · 24 of 48 positions shown · non-contrast
Comparison: 02/15/2020 lumbar spine radiographs.

CLINICAL DATA: Lumbar radiculopathy.

EXAM:
MRI LUMBAR SPINE WITHOUT CONTRAST
TECHNIQUE: Multiplanar, multisequence MR imaging of the lumbar spine was
performed. No intravenous contrast was administered.

[Series 4: T2 · sagittal · 4.0mm · 0.81mm/px · 4 of 19 slices shown (1 of 2)]
[im 1/19]
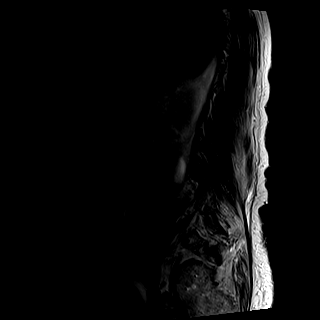
[im 7/19]
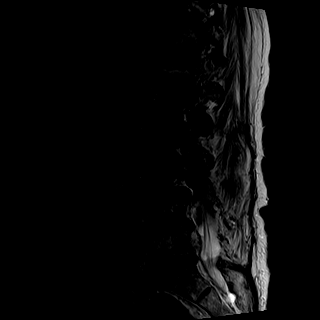
[im 13/19]
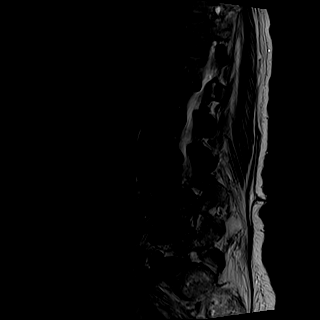
[im 19/19]
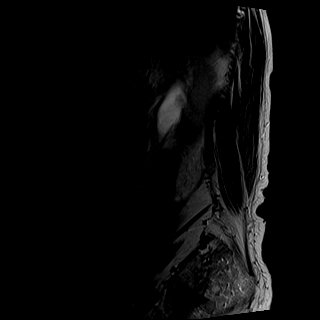

[Series 5: T1 · sagittal · 4.0mm · 0.41mm/px · 3 of 19 slices shown (1 of 2)]
[im 1/19]
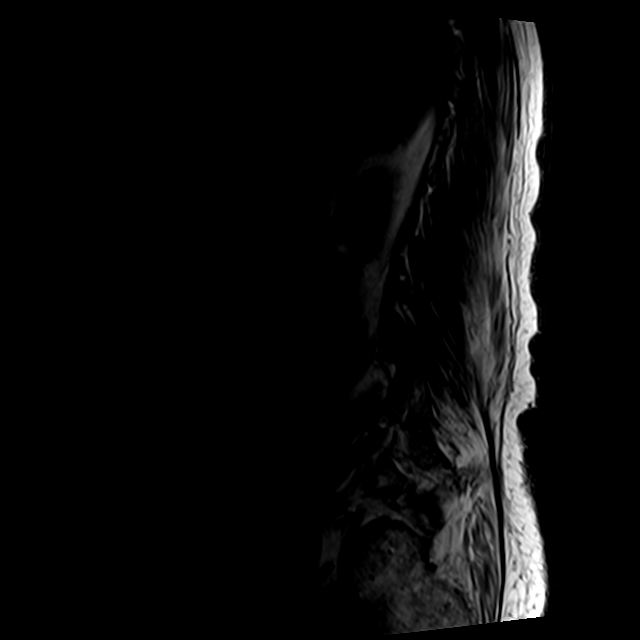
[im 10/19]
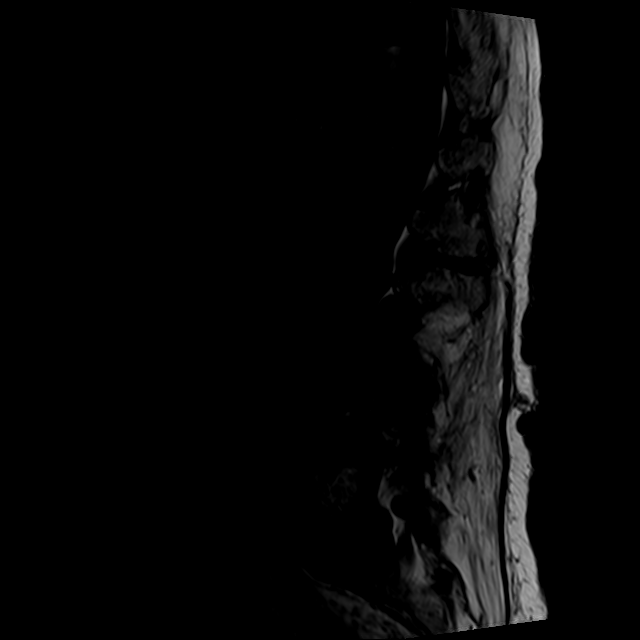
[im 19/19]
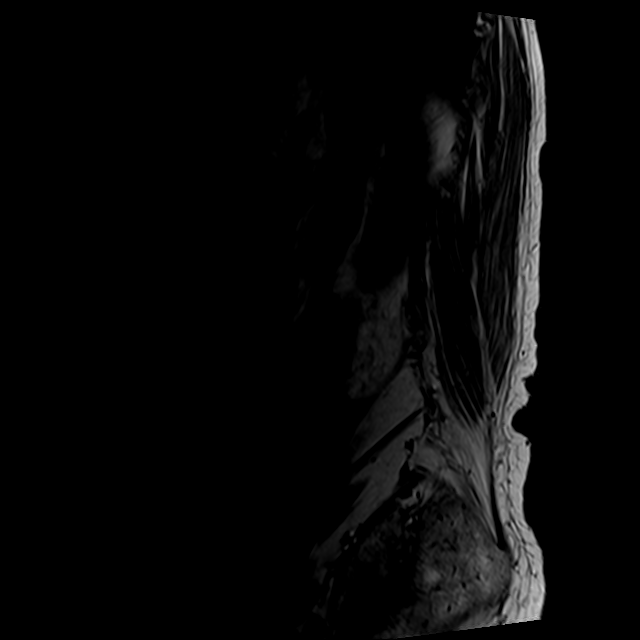

[Series 6: STIR · sagittal · 4.0mm · 0.51mm/px · 3 of 19 slices shown]
[im 1/19]
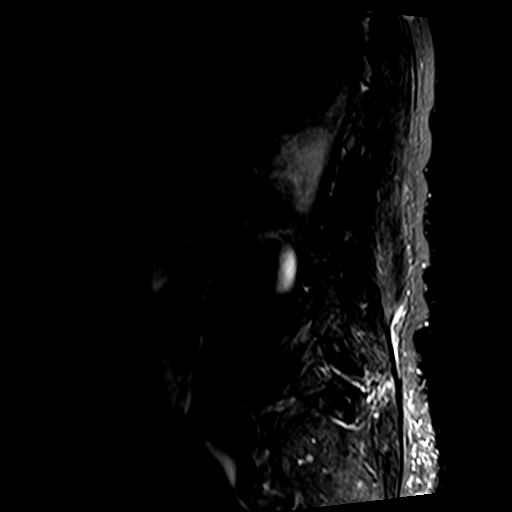
[im 10/19]
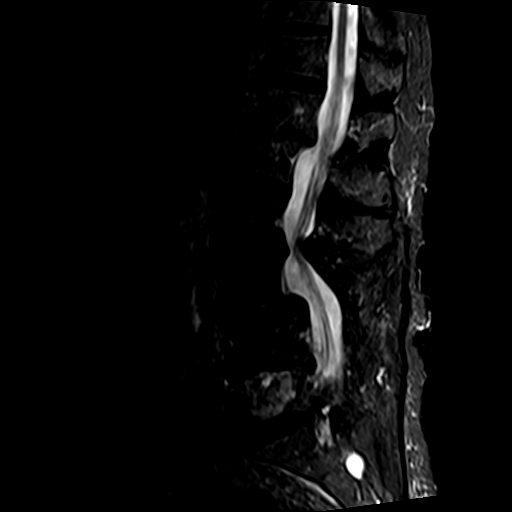
[im 19/19]
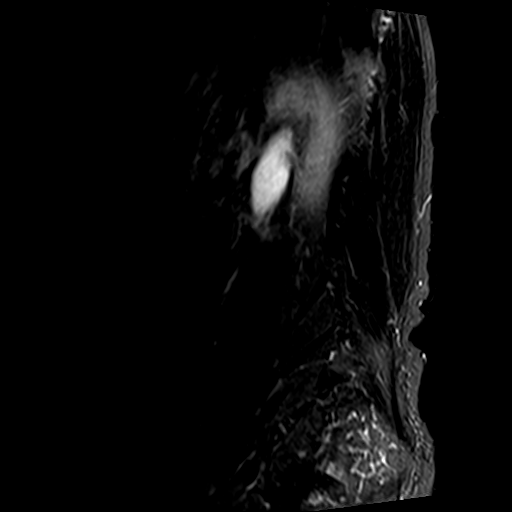

[Series 10: T2 · axial · 4.0mm · 0.78mm/px · z∈[-43,+211]mm · 7 of 46 slices shown (2 of 2)]
[im 1/46]
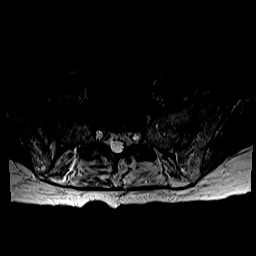
[im 8/46]
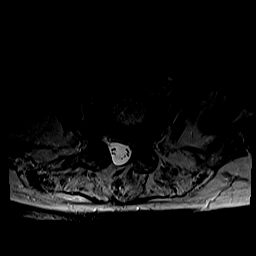
[im 16/46]
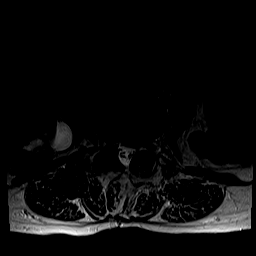
[im 23/46]
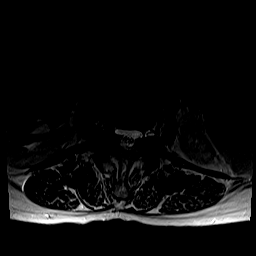
[im 31/46]
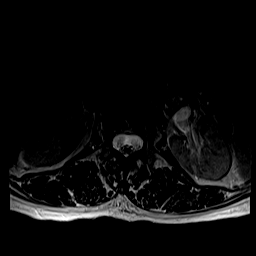
[im 38/46]
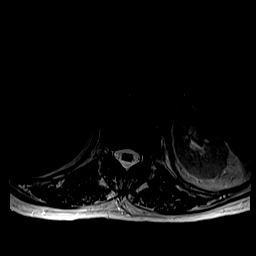
[im 46/46]
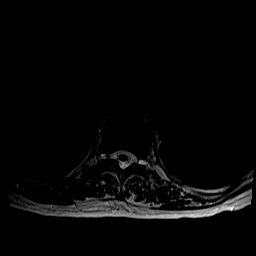

[Series 13: T1 · axial · 4.0mm · 0.39mm/px · z∈[-43,+211]mm · 7 of 46 slices shown (2 of 2)]
[im 1/46]
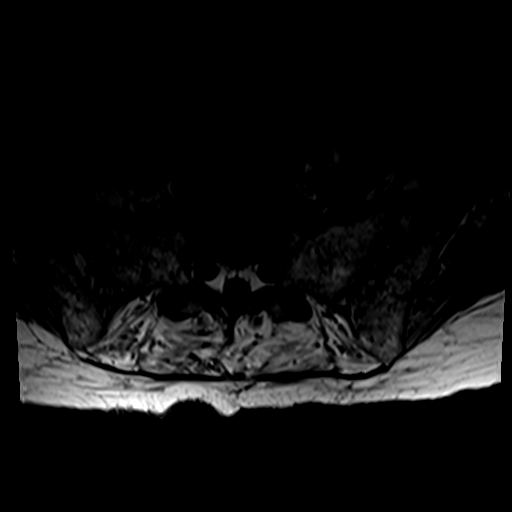
[im 8/46]
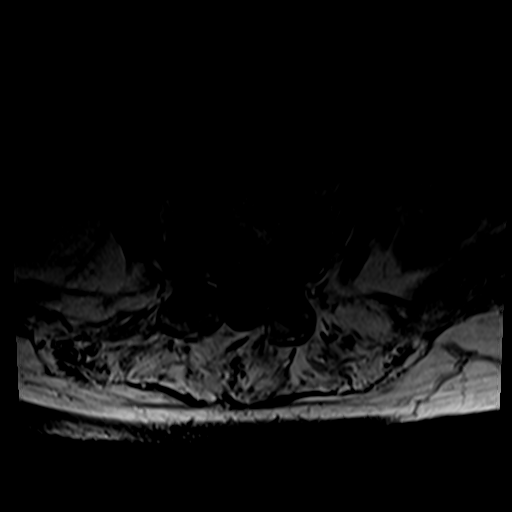
[im 16/46]
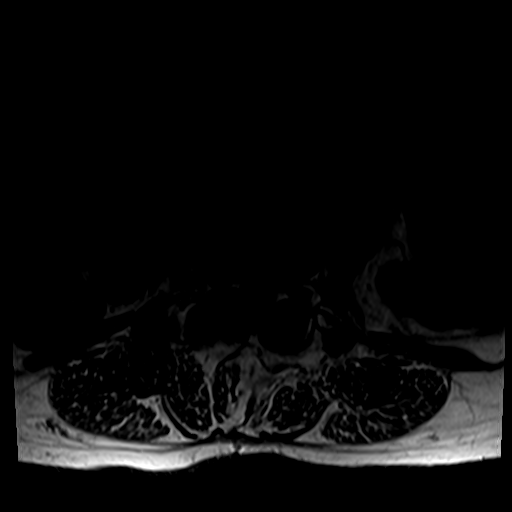
[im 23/46]
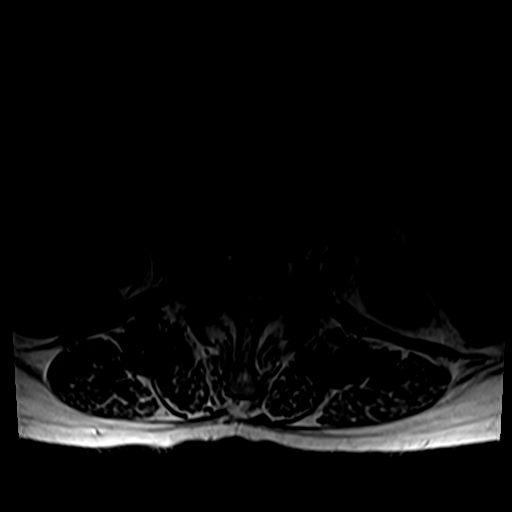
[im 31/46]
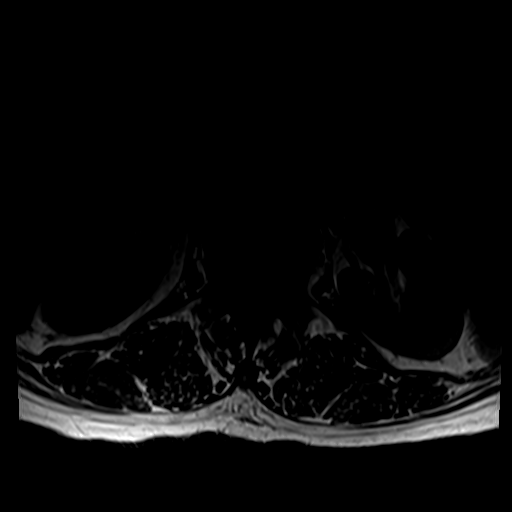
[im 38/46]
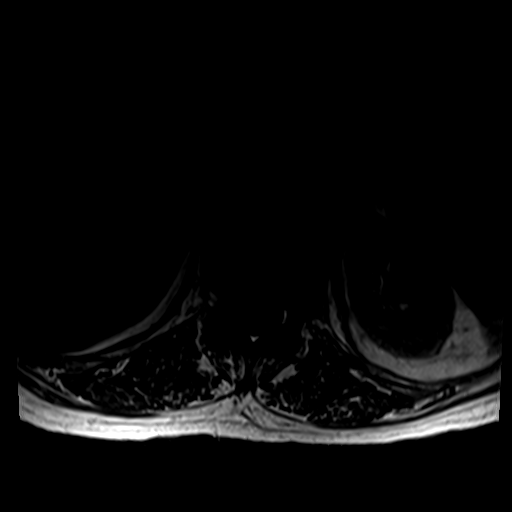
[im 46/46]
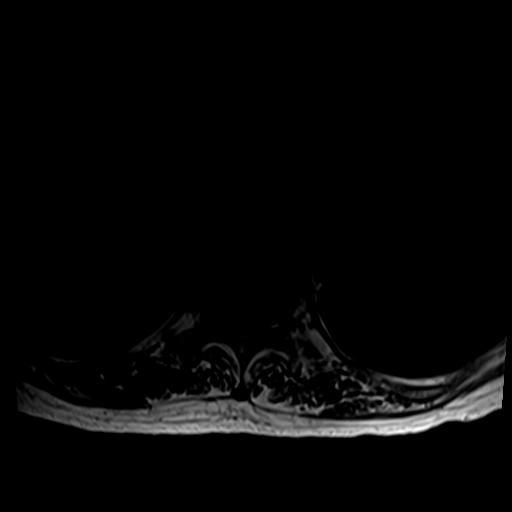

[24 of 48 positions shown; findings below may reference images not displayed]

FINDINGS: Segmentation: There are 5 non rib-bearing lumbar type vertebral
bodies. Small left lumbosacral pseudoarthrosis.

Alignment: Grade 1 T12-L1 retrolisthesis with partial fusion. Grade
1 L3-4 anterolisthesis. Grade 1 L4-5 retrolisthesis.

Vertebrae: Normal bone marrow signal intensity. Partial fusion at
the L4-5 level. Scattered hemangiomata.

Conus medullaris and cauda equina: Conus extends to the L2 level.
Conus and cauda equina appear normal.

Disc levels: Multilevel desiccation. Moderate to severe T12-L1 and
L4-5 disc space loss. Mild disc space loss at the remaining lumbar
levels.

T12-L1: Grade 1 retrolisthesis. Mild spinal canal and bilateral
neural foraminal narrowing.

L1-2: Disc bulge, ligamentum flavum and bilateral facet hypertrophy.
Moderate right and mild left neural foraminal narrowing.

L2-3: Disc bulge, ligamentum flavum and bilateral facet hypertrophy.
Moderate spinal canal and bilateral neural foraminal narrowing.

L3-4: Grade 1 anterolisthesis. Sequela of laminectomy. Disc bulge
with superimposed central protrusion and bilateral facet
hypertrophy. Mild right neural foraminal narrowing. No significant
spinal canal or left neural foraminal narrowing.

L4-5: Partial fusion of the vertebral bodies with grade 1
retrolisthesis. Sequela of laminectomy. Bilateral facet degenerative
spurring. Mild bilateral neural foraminal narrowing. No significant
spinal canal narrowing.

L5-S1: Sequela of laminectomy. Disc bulge with superimposed right
foraminal protrusion and bilateral facet hypertrophy. Moderate right
neural foraminal narrowing. No significant spinal canal or left
neural foraminal narrowing.

Paraspinal and other soft tissues: Sacral Tarlov cysts. Postsurgical
appearance of the paraspinal tissues at the L3-5 level.
IMPRESSION: Multilevel spondylosis with grade 1 T12-L1 and L4-5 retrolisthesis.
Grade 1 L3-4 anterolisthesis.

Moderate L2-3 spinal canal and neural foraminal narrowing.

Moderate right L1-2 and L5-S1 neural foraminal narrowing.

Mild spinal canal and bilateral neural foraminal narrowing at the
T12-L1 level.

## 2021-06-10 ENCOUNTER — Telehealth: Payer: Self-pay

## 2021-06-10 NOTE — Telephone Encounter (Signed)
Spoke to patient spouse. Patient was not available to schedule AWV. Can call PCP office to make awv appt. Sw,cma

## 2021-08-01 ENCOUNTER — Ambulatory Visit (INDEPENDENT_AMBULATORY_CARE_PROVIDER_SITE_OTHER): Payer: Medicare Other | Admitting: Adult Health

## 2021-08-01 ENCOUNTER — Encounter: Payer: Self-pay | Admitting: Adult Health

## 2021-08-01 VITALS — BP 122/60 | HR 75 | Temp 98.0°F | Ht 63.5 in | Wt 131.0 lb

## 2021-08-01 DIAGNOSIS — K5900 Constipation, unspecified: Secondary | ICD-10-CM

## 2021-08-01 NOTE — Patient Instructions (Signed)
I am going to have mix the following together  1 cup of warm prune juice  2 tablespoons of melted butter  2 tablespoons of olive oil   Drink quickly.

## 2021-08-01 NOTE — Progress Notes (Signed)
Subjective:    Patient ID: Briana Fitzgerald, female    DOB: Mar 05, 1932, 87 y.o.   MRN: [id number]  HPI  87 year old female who  has a past medical history of Arthritis, BACK PAIN (07/18/2008), Bowel obstruction ([place]), CONSTIPATION (10/02/2010), GERD (10/02/2010), Headache, IBS (irritable bowel syndrome), LAMINECTOMY, LUMBAR, HX OF (06/16/2008), OSTEOARTHRITIS (10/02/2010), Pneumonia, RESTLESS LEG SYNDROME (10/02/2010), SACROILIAC JOINT DYSFUNCTION (06/16/2008), Sleep apnea, STYE (10/02/2010), and TRANSIENT ISCHEMIC ATTACKS, HX OF (10/02/2010).  She is a patient of Dr. Elease Hashimoto who I am seeing today for an acute issue. She reports being constipated for the last week.  Tried using Fleet enema yesterday and was able to have small amount of stool passed.  Yesterday she used 3 laxatives, today she has not used any laxatives.  She does report mild abdominal pain.  Is able to pass gas.  Had nausea yesterday but nothing today.  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  This was seen on CT in November 2017.  Review of Systems See HPI   Past Medical History:  Diagnosis Date   Arthritis    BACK PAIN 07/18/2008   Bowel obstruction ([place])    CONSTIPATION 10/02/2010   GERD 10/02/2010   Headache    IBS (irritable bowel syndrome)    LAMINECTOMY, LUMBAR, HX OF 06/16/2008   OSTEOARTHRITIS 10/02/2010   Pneumonia    RESTLESS LEG SYNDROME 10/02/2010   SACROILIAC JOINT DYSFUNCTION 06/16/2008   Sleep apnea    STYE 10/02/2010   TRANSIENT ISCHEMIC ATTACKS, HX OF 10/02/2010    Social History   Socioeconomic History   Marital status: Married    Spouse name: Not on file   Number of children: 2   Years of education: Not on file   Highest education level: Not on file  Occupational History   Occupation: retired  Tobacco Use   Smoking status: Never   Smokeless tobacco: Never  Vaping Use   Vaping Use: Never used  Substance and Sexual Activity    Alcohol use: Yes    Comment: at dinner with friends, 2-3 per month - wine   Drug use: No   Sexual activity: Not on file  Other Topics Concern   Not on file  Social History Narrative   Not on file   Social Determinants of Health   Financial Resource Strain: Not on file  Food Insecurity: Not on file  Transportation Needs: Not on file  Physical Activity: Not on file  Stress: Not on file  Social Connections: Not on file  Intimate Partner Violence: Not on file    Past Surgical History:  Procedure Laterality Date   ABDOMINAL HYSTERECTOMY  1979   TAH, fibroids   APOGEE / [place]  2005   catarac,both eyes   LIVER BIOPSY  1997   benign, hemangioma resection   SPINE SURGERY  2006   laminectomy    Family History  Problem Relation Age of Onset   Stroke Father    Diabetes Father        type ll   Pancreatitis Brother    Breast cancer Paternal Aunt        aunt   Colon cancer Neg Hx    Stomach cancer Neg Hx    Rectal cancer Neg Hx    Liver cancer Neg Hx    Esophageal cancer Neg Hx  No Known Allergies  Current Outpatient Medications on File Prior to Visit  Medication Sig Dispense Refill   acetaminophen (TYLENOL) 500 MG tablet Take 500 mg by mouth every 6 (six) hours as needed.     Calcium Carbonate-Vitamin D 600-400 MG-UNIT tablet Take 1 tablet by mouth daily.     COVID-19 mRNA Vac-TriS, Pfizer, (PFIZER-BIONT COVID-19 VAC-TRIS) SUSP injection Inject into the muscle. 0.3 mL 0   ferrous sulfate 324 MG TBEC Take 324 mg by mouth 2 (two) times a week.     Fluocinolone Acetonide 0.01 % SHAM Apply topically to scalp once daily as needed for scalp pruritis and leave on for 5 minutes and then rinse off 120 mL 1   furosemide (LASIX) 40 MG tablet TAKE 1 TABLET BY MOUTH EVERY DAY 90 tablet 1   Misc Natural Products (TART CHERRY ADVANCED PO) Take 1,200 mg by mouth daily.     mometasone (ELOCON) 0.1 % lotion Apply once daily to affected  areas of scalp as needed 60 mL 0   Omega-3 Fatty Acids (SALMON OIL-1000 PO) Take 1 tablet by mouth daily.     pramipexole (MIRAPEX) 0.5 MG tablet Take 1 tablet (0.5 mg total) by mouth at bedtime. 90 tablet 1   pyridoxine (B-6) 100 MG tablet Take 100 mg by mouth daily.     rosuvastatin (CRESTOR) 5 MG tablet TAKE 1 TABLET BY MOUTH DAILY.MAKE YEARLY APPT WITH DR. Marlou Porch FOR Brown Human 2022 FOR FUTURE REFILLS 90 tablet 3   Ubrogepant (UBRELVY) 50 MG TABS Take one tablet by mouth as needed for migraine headache.  May repeat dose times one after 2 hours as needed for ongoing headache. 12 tablet 5   vitamin B-12 (CYANOCOBALAMIN) 500 MCG tablet Take 500 mcg by mouth daily.     vitamin C (ASCORBIC ACID) 500 MG tablet Take 500 mg by mouth 2 (two) times a week.      vitamin E 400 UNIT capsule Take 400 Units by mouth daily.     No current facility-administered medications on file prior to visit.    BP 122/60   Pulse 75   Temp 98 F (36.7 C) (Oral)   Ht 5' 3.5" (1.613 m)   Wt 131 lb (59.4 kg)   SpO2 95%   BMI 22.84 kg/m       Objective:   Physical Exam Vitals reviewed.  Constitutional:      Appearance: Normal appearance.  Cardiovascular:     Rate and Rhythm: Normal rate and regular rhythm.     Pulses: Normal pulses.     Heart sounds: Normal heart sounds.  Pulmonary:     Effort: Pulmonary effort is normal.     Breath sounds: Normal breath sounds.  Abdominal:     General: Abdomen is flat. Bowel sounds are normal.     Palpations: Abdomen is soft.     Tenderness: There is no abdominal tenderness.     Comments: Stool throughout throughout abdominal exam.  Musculoskeletal:        General: Normal range of motion.  Skin:    General: Skin is warm and dry.     Capillary Refill: Capillary refill takes less than 2 seconds.  Neurological:     General: No focal deficit present.     Mental Status: She is alert and oriented to person, place, and time.  Psychiatric:        Mood and Affect: Mood  normal.        Behavior: Behavior normal.  Thought Content: Thought content normal.        Judgment: Judgment normal.       Assessment & Plan:  1. Constipation, unspecified constipation type -No concern for bowel obstruction.  There has been a recall on magnesium citrate.  Advised warm prune juice mixed with melted butter and olive oil.  When she has a bowel movement to make sure she staying hydrated, add fiber to her diet.  Dorothyann Peng, NP

## 2021-08-13 ENCOUNTER — Ambulatory Visit (INDEPENDENT_AMBULATORY_CARE_PROVIDER_SITE_OTHER): Payer: Medicare Other | Admitting: Family Medicine

## 2021-08-13 VITALS — BP 130/54 | HR 80 | Temp 98.4°F | Wt 127.8 lb

## 2021-08-13 DIAGNOSIS — K5909 Other constipation: Secondary | ICD-10-CM

## 2021-08-13 DIAGNOSIS — J0110 Acute frontal sinusitis, unspecified: Secondary | ICD-10-CM

## 2021-08-13 MED ORDER — AMOXICILLIN-POT CLAVULANATE 875-125 MG PO TABS: 1.0000 | ORAL_TABLET | Freq: Two times a day (BID) | ORAL | 0 refills | Status: DC

## 2021-08-13 NOTE — Progress Notes (Signed)
Established Patient Office Visit  Subjective:  Patient ID: TZIPORAH KNOKE, female    DOB: 15-Jul-1932  Age: 87 y.o. MRN: [id number]  CC:  Chief Complaint  Patient presents with   Follow-up    HPI Gracious H Dudek presents for discussion of exacerbation of chronic problem and for separate acute items as below  She has chronic constipation.  She states she has had this for at least 40 to 50 years.  She currently takes 2 stool softeners per day and has tried things like Mirapex in the past without much improvement.  She recently was advised to try over-the-counter home remedy of prune juice, melted butter, and olive oil.  She did not see much improvement.  She states she generally has a bowel movement about once per week.  She does take Mirapex for restless legs which can occasionally cause constipation but no other medications that should be contributing.  She had TSH last April which was normal.  Denies any abdominal swelling or pain.  No recent fever.  She states her last colonoscopy was at least 12 to 15 years ago prior to moving here.  She saw GI for this back in 2017 and had negative single contrast barium enema.  There have been some questioning of narrowing of the rectosigmoid region from CAT scan and none was noted on her barium study.  No recent bloody stools.  Second item is acute sinusitis symptoms for several weeks.  Frontal sinus pressure and pain.  Yellowish to greenish nasal discharge.  Occasional headaches.  No fever.  No cough.  Past Medical History:  Diagnosis Date   Arthritis    BACK PAIN 07/18/2008   Bowel obstruction ([place])    CONSTIPATION 10/02/2010   GERD 10/02/2010   Headache    IBS (irritable bowel syndrome)    LAMINECTOMY, LUMBAR, HX OF 06/16/2008   OSTEOARTHRITIS 10/02/2010   Pneumonia    RESTLESS LEG SYNDROME 10/02/2010   SACROILIAC JOINT DYSFUNCTION 06/16/2008   Sleep apnea    STYE 10/02/2010   TRANSIENT ISCHEMIC ATTACKS, HX OF 10/02/2010    Past Surgical  History:  Procedure Laterality Date   ABDOMINAL HYSTERECTOMY  1979   TAH, fibroids   APOGEE / PERIGEE REPAIR  2005   [place]  2005   catarac,both eyes   LIVER BIOPSY  1997   benign, hemangioma resection   SPINE SURGERY  2006   laminectomy    Family History  Problem Relation Age of Onset   Stroke Father    Diabetes Father        type ll   Pancreatitis Brother    Breast cancer Paternal Aunt        aunt   Colon cancer Neg Hx    Stomach cancer Neg Hx    Rectal cancer Neg Hx    Liver cancer Neg Hx    Esophageal cancer Neg Hx     Social History   Socioeconomic History   Marital status: Married    Spouse name: Not on file   Number of children: 2   Years of education: Not on file   Highest education level: Not on file  Occupational History   Occupation: retired  Tobacco Use   Smoking status: Never   Smokeless tobacco: Never  Vaping Use   Vaping Use: Never used  Substance and Sexual Activity   Alcohol use: Yes    Comment: at dinner with friends, 2-3 per month - wine  Drug use: No   Sexual activity: Not on file  Other Topics Concern   Not on file  Social History Narrative   Not on file   Social Determinants of Health   Financial Resource Strain: Not on file  Food Insecurity: Not on file  Transportation Needs: Not on file  Physical Activity: Not on file  Stress: Not on file  Social Connections: Not on file  Intimate Partner Violence: Not on file    Outpatient Medications Prior to Visit  Medication Sig Dispense Refill   acetaminophen (TYLENOL) 500 MG tablet Take 500 mg by mouth every 6 (six) hours as needed.     Calcium Carbonate-Vitamin D 600-400 MG-UNIT tablet Take 1 tablet by mouth daily.     COVID-19 mRNA Vac-TriS, Pfizer, (PFIZER-BIONT COVID-19 VAC-TRIS) SUSP injection Inject into the muscle. 0.3 mL 0   ferrous sulfate 324 MG TBEC Take 324 mg by mouth 2 (two) times a week.     Fluocinolone Acetonide 0.01 % SHAM Apply topically  to scalp once daily as needed for scalp pruritis and leave on for 5 minutes and then rinse off 120 mL 1   furosemide (LASIX) 40 MG tablet TAKE 1 TABLET BY MOUTH EVERY DAY 90 tablet 1   Misc Natural Products (TART CHERRY ADVANCED PO) Take 1,200 mg by mouth daily.     mometasone (ELOCON) 0.1 % lotion Apply once daily to affected areas of scalp as needed 60 mL 0   Omega-3 Fatty Acids (SALMON OIL-1000 PO) Take 1 tablet by mouth daily.     pramipexole (MIRAPEX) 0.5 MG tablet Take 1 tablet (0.5 mg total) by mouth at bedtime. 90 tablet 1   pyridoxine (B-6) 100 MG tablet Take 100 mg by mouth daily.     rosuvastatin (CRESTOR) 5 MG tablet TAKE 1 TABLET BY MOUTH DAILY.MAKE YEARLY APPT WITH DR. Marlou Porch FOR [facility] 2022 FOR FUTURE REFILLS 90 tablet 3   Ubrogepant (UBRELVY) 50 MG TABS Take one tablet by mouth as needed for migraine headache.  May repeat dose times one after 2 hours as needed for ongoing headache. 12 tablet 5   vitamin B-12 (CYANOCOBALAMIN) 500 MCG tablet Take 500 mcg by mouth daily.     vitamin C (ASCORBIC ACID) 500 MG tablet Take 500 mg by mouth 2 (two) times a week.      vitamin E 400 UNIT capsule Take 400 Units by mouth daily.     No facility-administered medications prior to visit.    No Known Allergies  ROS Review of Systems  Constitutional:  Positive for fatigue. Negative for chills and fever.  HENT:  Positive for sinus pressure and sinus pain.   Respiratory:  Negative for shortness of breath.   Cardiovascular:  Negative for chest pain.  Gastrointestinal:  Positive for constipation. Negative for abdominal distention, anal bleeding, diarrhea, nausea and vomiting.  Neurological:  Positive for headaches.     Objective:    Physical Exam Vitals reviewed.  Cardiovascular:     Rate and Rhythm: Normal rate.  Pulmonary:     Effort: Pulmonary effort is normal.     Breath sounds: Normal breath sounds.  Abdominal:     General: Bowel sounds are normal. There is no distension.      Palpations: Abdomen is soft. There is no mass.     Tenderness: There is no abdominal tenderness. There is no guarding or rebound.  Genitourinary:    Comments: Rectal exam reveals slightly decreased anal sphincter tone.  She does have some  small hard pellet-like stools rectal vault but no stool impaction. Neurological:     Mental Status: She is alert.    BP (!) 130/54 (BP Location: Left Arm, Patient Position: Sitting, Cuff Size: Normal)   Pulse 80   Temp 98.4 F (36.9 C) (Oral)   Wt 127 lb 12.8 oz (58 kg)   SpO2 98%   BMI 22.28 kg/m  Wt Readings from Last 3 Encounters:  08/13/21 127 lb 12.8 oz (58 kg)  08/01/21 131 lb (59.4 kg)  02/16/21 132 lb 4.8 oz (60 kg)     Health Maintenance Due  Topic Date Due   TETANUS/TDAP  Never done   Zoster Vaccines- Shingrix (1 of 2) Never done   INFLUENZA VACCINE  04/16/2021   COVID-19 Vaccine (4 - Booster for Pfizer series) 06/18/2021    There are no preventive care reminders to display for this patient.  Lab Results  Component Value Date   TSH 2.98 12/26/2020   Lab Results  Component Value Date   WBC 6.0 12/26/2020   HGB 13.2 12/26/2020   HCT 39.3 12/26/2020   MCV 82.5 12/26/2020   PLT 155.0 12/26/2020   Lab Results  Component Value Date   NA 139 12/26/2020   K 3.9 12/26/2020   CHLORIDE 106 10/15/2016   CO2 28 12/26/2020   GLUCOSE 86 12/26/2020   BUN 14 12/26/2020   CREATININE 0.72 12/26/2020   BILITOT 1.4 (H) 12/26/2020   ALKPHOS 63 12/26/2020   AST 18 12/26/2020   ALT 13 12/26/2020   PROT 6.5 12/26/2020   ALBUMIN 3.7 12/26/2020   CALCIUM 10.2 12/26/2020   ANIONGAP 6 07/24/2018   EGFR 58 (L) 10/15/2016   GFR 75.43 12/26/2020   Lab Results  Component Value Date   CHOL 67 12/26/2020   Lab Results  Component Value Date   HDL 35.10 (L) 12/26/2020   Lab Results  Component Value Date   LDLCALC 21 12/26/2020   Lab Results  Component Value Date   TRIG 55.0 12/26/2020   Lab Results  Component Value Date    CHOLHDL 2 12/26/2020   No results found for: HGBA1C    Assessment & Plan:   Problem List Items Addressed This Visit   None Visit Diagnoses     Chronic constipation    -  Primary   Acute non-recurrent frontal sinusitis       Relevant Medications   amoxicillin-clavulanate (AUGMENTIN) 875-125 MG tablet     -We stressed the importance of adequate hydration -Recommend she consider fiber supplement such as FiberCon, Metamucil, or Citrucel -Discussed high-fiber foods with handout given -Continue Senokot-S as needed -Increased ambulation as tolerated  -Start Augmentin for 10 days regarding her sinusitis symptoms. -Follow-up for any persistent or worsening symptoms.  Meds ordered this encounter  Medications   amoxicillin-clavulanate (AUGMENTIN) 875-125 MG tablet    Sig: Take 1 tablet by mouth 2 (two) times daily.    Dispense:  20 tablet    Refill:  0    Follow-up: No follow-ups on file.    Carolann Littler, MD

## 2021-08-13 NOTE — Patient Instructions (Signed)
Make sure to get plenty of liquids  Walk as much as possible  Take OTC Senokot S once daily as needed  Continue with the prune juice  Try to increase high fiber foods (see handout)  Consider OTC fiber supplement such as Metamucil or Fibercon

## 2021-08-15 ENCOUNTER — Telehealth: Payer: Self-pay

## 2021-08-15 NOTE — Telephone Encounter (Signed)
Caller states his wife started taking a antibiotic and now has a rash on both legs.  08/15/2021 10:06:47 AM Attempt made - line busy Ysidro Evert, RN, Levada Dy  08/15/2021 10:19:14 AM FINAL ATTEMPT MADE - no message left  08/15/21 1344: Pt states she started taking abx 11/28 & today noticed the rash on bilat knees to ankle that is painful as 4 on 0-10 pain scale; rash is dark brown in color. Pt advised to hold Augmentin (has not taken any today).

## 2021-08-16 NOTE — Telephone Encounter (Signed)
Called and spoke with patient.  She stated that rash today looks a little better, but still very dark.   Will call office on 08/20/21, if rash not any better.

## 2021-08-24 ENCOUNTER — Other Ambulatory Visit: Payer: Self-pay | Admitting: Family Medicine

## 2021-08-28 ENCOUNTER — Other Ambulatory Visit: Payer: Self-pay | Admitting: Family Medicine

## 2021-09-28 ENCOUNTER — Telehealth: Payer: Self-pay | Admitting: Family Medicine

## 2021-09-28 NOTE — Telephone Encounter (Signed)
Patient called because the pramipexole (MIRAPEX) 0.5 MG tablet is for three times a day and she does not feel she needs to take them that often. Patient wants a call back from Dr.[name] to discuss because this is not correct.    Good callback number [phone]   Please advise

## 2021-10-01 MED ORDER — PRAMIPEXOLE DIHYDROCHLORIDE 0.5 MG PO TABS: 0.5000 mg | ORAL_TABLET | Freq: Every evening | ORAL | 3 refills | Status: DC | PRN

## 2021-10-01 NOTE — Telephone Encounter (Signed)
Rx sent in. Spoke with the patient's husband. He is aware of the medication changes and will pass along the message.

## 2021-11-06 ENCOUNTER — Telehealth: Payer: Self-pay | Admitting: Family Medicine

## 2021-11-06 DIAGNOSIS — R0981 Nasal congestion: Secondary | ICD-10-CM

## 2021-11-06 NOTE — Telephone Encounter (Signed)
Patient called in requesting to be referred to a specialist that will see her regarding her sinuses. I offered an appointment to patient but she stated that Dr.[name] said he will not give her anything else for this.  Patient could be contacted at [phone].  Please advise.

## 2021-11-07 NOTE — Telephone Encounter (Signed)
Spoke with the patient, informed her the referral was placed and someone will call with appt info.

## 2021-11-14 DIAGNOSIS — M25512 Pain in left shoulder: Secondary | ICD-10-CM | POA: Diagnosis not present

## 2021-11-16 ENCOUNTER — Other Ambulatory Visit: Payer: Self-pay | Admitting: Cardiology

## 2021-11-23 ENCOUNTER — Other Ambulatory Visit: Payer: Self-pay | Admitting: Cardiology

## 2021-12-05 ENCOUNTER — Other Ambulatory Visit: Payer: Self-pay | Admitting: Oncology

## 2021-12-05 DIAGNOSIS — D72820 Lymphocytosis (symptomatic): Secondary | ICD-10-CM

## 2021-12-07 ENCOUNTER — Other Ambulatory Visit: Payer: Self-pay

## 2021-12-07 ENCOUNTER — Inpatient Hospital Stay: Payer: Medicare Other | Attending: Oncology

## 2021-12-07 ENCOUNTER — Inpatient Hospital Stay (HOSPITAL_BASED_OUTPATIENT_CLINIC_OR_DEPARTMENT_OTHER): Payer: Medicare Other | Admitting: Oncology

## 2021-12-07 VITALS — BP 120/58 | HR 79 | Temp 97.8°F | Resp 16 | Ht 63.5 in | Wt 123.2 lb

## 2021-12-07 DIAGNOSIS — D72820 Lymphocytosis (symptomatic): Secondary | ICD-10-CM

## 2021-12-07 DIAGNOSIS — Z79899 Other long term (current) drug therapy: Secondary | ICD-10-CM | POA: Insufficient documentation

## 2021-12-07 DIAGNOSIS — C9110 Chronic lymphocytic leukemia of B-cell type not having achieved remission: Secondary | ICD-10-CM | POA: Diagnosis not present

## 2021-12-07 DIAGNOSIS — D696 Thrombocytopenia, unspecified: Secondary | ICD-10-CM | POA: Insufficient documentation

## 2021-12-07 LAB — CBC WITH DIFFERENTIAL (CANCER CENTER ONLY)
Abs Immature Granulocytes: 0.02 10*3/uL (ref 0.00–0.07)
Basophils Absolute: 0 10*3/uL (ref 0.0–0.1)
Basophils Relative: 1 %
Eosinophils Absolute: 0.1 10*3/uL (ref 0.0–0.5)
Eosinophils Relative: 3 %
HCT: 38.3 % (ref 36.0–46.0)
Hemoglobin: 12.9 g/dL (ref 12.0–15.0)
Immature Granulocytes: 1 %
Lymphocytes Relative: 24 %
Lymphs Abs: 1 10*3/uL (ref 0.7–4.0)
MCH: 28.4 pg (ref 26.0–34.0)
MCHC: 33.7 g/dL (ref 30.0–36.0)
MCV: 84.4 fL (ref 80.0–100.0)
Monocytes Absolute: 0.2 10*3/uL (ref 0.1–1.0)
Monocytes Relative: 6 %
Neutro Abs: 2.7 10*3/uL (ref 1.7–7.7)
Neutrophils Relative %: 65 %
Platelet Count: 119 10*3/uL — ABNORMAL LOW (ref 150–400)
RBC: 4.54 MIL/uL (ref 3.87–5.11)
RDW: 15.6 % — ABNORMAL HIGH (ref 11.5–15.5)
Smear Review: NORMAL
WBC Count: 4.1 10*3/uL (ref 4.0–10.5)
nRBC: 0 % (ref 0.0–0.2)

## 2021-12-07 NOTE — Progress Notes (Signed)
Hematology and Oncology Follow Up Visit ? ?Briana Fitzgerald ?[id number] ?Jan 11, 1932 88 y.o. ?12/07/2021 1:00 PM ?Briana Fitzgerald, MDBurchette, [name], MD  ? ?Principle Diagnosis: 88 year old woman with CLL diagnosed in 2017.  She was found to have lymphocytosis and adenopathy and has not required any treatment. ? ?Current therapy: Active surveillance. ? ?Interim History: Briana Fitzgerald returns today for a follow-up evaluation.  She is a 88 year old woman seen in consultation previously for CLL.  CBC in April 2022 showed normal hematological parameters at that time.  Clinically, she reports no major changes in her health with few complaints including mild weight loss and constipation.  She denies any fevers, chills or lymphadenopathy.  She denies any worsening constitutional symptoms. ? ? ? ? ? ?Medications: Updated on review. ?Current Outpatient Medications  ?Medication Sig Dispense Refill  ? acetaminophen (TYLENOL) 500 MG tablet Take 500 mg by mouth every 6 (six) hours as needed.    ? amoxicillin-clavulanate (AUGMENTIN) 875-125 MG tablet Take 1 tablet by mouth 2 (two) times daily. 20 tablet 0  ? Calcium Carbonate-Vitamin D 600-400 MG-UNIT tablet Take 1 tablet by mouth daily.    ? COVID-19 mRNA Vac-TriS, Pfizer, (PFIZER-BIONT COVID-19 VAC-TRIS) SUSP injection Inject into the muscle. 0.3 mL 0  ? ferrous sulfate 324 MG TBEC Take 324 mg by mouth 2 (two) times a week.    ? Fluocinolone Acetonide 0.01 % SHAM Apply topically to scalp once daily as needed for scalp pruritis and leave on for 5 minutes and then rinse off 120 mL 1  ? furosemide (LASIX) 40 MG tablet TAKE 1 TABLET BY MOUTH EVERY DAY 90 tablet 1  ? Misc Natural Products (TART CHERRY ADVANCED PO) Take 1,200 mg by mouth daily.    ? mometasone (ELOCON) 0.1 % lotion Apply once daily to affected areas of scalp as needed 60 mL 0  ? Omega-3 Fatty Acids (SALMON OIL-1000 PO) Take 1 tablet by mouth daily.    ? pramipexole (MIRAPEX) 0.5 MG tablet Take 1 tablet (0.5 mg  total) by mouth at bedtime as needed. Take one tablet by mouth daily as needed for restless legs. 90 tablet 3  ? pyridoxine (B-6) 100 MG tablet Take 100 mg by mouth daily.    ? rosuvastatin (CRESTOR) 5 MG tablet Take 1 tablet (5 mg total) by mouth daily. Please make overdue appt with Dr. Marlou Porch before anymore refills. Thank you 3rd and Final Attempt 15 tablet 0  ? Ubrogepant (UBRELVY) 50 MG TABS Take one tablet by mouth as needed for migraine headache.  May repeat dose times one after 2 hours as needed for ongoing headache. 12 tablet 5  ? vitamin B-12 (CYANOCOBALAMIN) 500 MCG tablet Take 500 mcg by mouth daily.    ? vitamin C (ASCORBIC ACID) 500 MG tablet Take 500 mg by mouth 2 (two) times a week.     ? vitamin E 400 UNIT capsule Take 400 Units by mouth daily.    ? ?No current facility-administered medications for this visit.  ? ? ? ?Allergies: No Known Allergies ? ?Past Medical History, Surgical history, Social history, and Family History  ? ? ?Physical Exam: ? ?Blood pressure (!) 120/58, pulse 79, temperature 97.8 ?F (36.6 ?C), temperature source Temporal, resp. rate 16, height 5' 3.5" (1.613 m), weight 123 lb 3.2 oz (55.9 kg), SpO2 98 %. ? ? ?ECOG: 1 ? ? ?General appearance: Alert, awake without any distress. ?Head: Atraumatic without abnormalities ?Oropharynx: Without any thrush or ulcers. ?Eyes: No scleral icterus. ?Lymph nodes:  No lymphadenopathy noted in the cervical, supraclavicular, or axillary nodes ?Heart:regular rate and rhythm, without any murmurs or gallops.   ?Lung: Clear to auscultation without any rhonchi, wheezes or dullness to percussion. ?Abdomin: Soft, nontender without any shifting dullness or ascites. ?Musculoskeletal: No clubbing or cyanosis. ?Neurological: No motor or sensory deficits. ?Skin: No rashes or lesions. ? ? ? ? ?Lab Results: ?Lab Results  ?Component Value Date  ? WBC 6.0 12/26/2020  ? HGB 13.2 12/26/2020  ? HCT 39.3 12/26/2020  ? MCV 82.5 12/26/2020  ? PLT 155.0 12/26/2020  ? ?   Chemistry   ?   ?Component Value Date/Time  ? NA 139 12/26/2020 1133  ? NA 140 06/18/2018 1655  ? NA 138 10/15/2016 1132  ? K 3.9 12/26/2020 1133  ? K 4.0 10/15/2016 1132  ? CL 105 12/26/2020 1133  ? CO2 28 12/26/2020 1133  ? CO2 25 10/15/2016 1132  ? BUN 14 12/26/2020 1133  ? BUN 19 06/18/2018 1655  ? BUN 14.1 10/15/2016 1132  ? CREATININE 0.72 12/26/2020 1133  ? CREATININE 0.84 07/24/2018 1053  ? CREATININE 0.9 10/15/2016 1132  ?    ?Component Value Date/Time  ? CALCIUM 10.2 12/26/2020 1133  ? CALCIUM 10.5 (H) 10/15/2016 1132  ? ALKPHOS 63 12/26/2020 1133  ? ALKPHOS 88 10/15/2016 1132  ? AST 18 12/26/2020 1133  ? AST 32 07/24/2018 1053  ? AST 28 10/15/2016 1132  ? ALT 13 12/26/2020 1133  ? ALT 18 07/24/2018 1053  ? ALT 22 10/15/2016 1132  ? BILITOT 1.4 (H) 12/26/2020 1133  ? BILITOT 1.3 (H) 07/24/2018 1053  ? BILITOT 1.20 10/15/2016 1132  ?  ? ? ? ?Impression and Plan: ? ?88 year old woman with: ?  ?1.  CLL diagnosed in 2017 with lymphocytosis.  She has not required any treatment since that time. ? ?The natural course of this disease was reviewed at this time and treatment choices were discussed.  Indication for treatment for CLL including painful adenopathy, rapid rise in his white cell count, constitutional symptoms among others were reviewed.  Treatment with systemic chemotherapy versus oral targeted therapy were also reviewed.  At this time I do not see any indication for treatment. ? ? ? ?2.Thrombocytopenia: Resolved at this time with platelet count within normal range. ? ?3.  Weight loss and GI complaints: Unrelated to her hematological condition at this time.  I have recommended obtaining imaging studies of the abdomen and pelvis to rule out bulky adenopathy and he will let me know in the near future if she is interested in doing that. ? ?4. Follow-up: In 6 months for repeat follow-up. ? ? ?30 minutes were dedicated to this visit.  The time was spent on reviewing laboratory data, discussing differential  diagnosis, management choices and future plan of care review. ? ? ?Briana Button, MD ?3/24/20231:00 PM ?

## 2021-12-09 ENCOUNTER — Other Ambulatory Visit: Payer: Self-pay | Admitting: Cardiology

## 2021-12-11 ENCOUNTER — Telehealth: Payer: Self-pay

## 2021-12-11 NOTE — Telephone Encounter (Signed)
Send in one more 15 day supply and adjust the note to say needs to make an appt before any further refills can be given ?

## 2021-12-11 NOTE — Telephone Encounter (Signed)
This is pt's 3rd and final attempt. Please advise. Thanks ?

## 2021-12-13 ENCOUNTER — Other Ambulatory Visit: Payer: Self-pay | Admitting: Cardiology

## 2021-12-14 ENCOUNTER — Telehealth: Payer: Self-pay | Admitting: Family Medicine

## 2021-12-14 NOTE — Telephone Encounter (Signed)
Patient called to get prescription for Furosemide in the '20mg'$  as she cannot split the 40 ? ? ? ?Please send to ?[id number] [place], [place] - [address] Phone:  [phone] ?Fax:  [phone] ?  ? ? ? ? ? ?Please advise  ?

## 2021-12-16 ENCOUNTER — Other Ambulatory Visit: Payer: Self-pay | Admitting: Family Medicine

## 2021-12-17 MED ORDER — FUROSEMIDE 20 MG PO TABS: 20.0000 mg | ORAL_TABLET | Freq: Every day | ORAL | 0 refills | Status: DC

## 2021-12-17 NOTE — Telephone Encounter (Signed)
Rx sent 

## 2022-02-13 DIAGNOSIS — R0982 Postnasal drip: Secondary | ICD-10-CM | POA: Diagnosis not present

## 2022-02-13 DIAGNOSIS — J343 Hypertrophy of nasal turbinates: Secondary | ICD-10-CM | POA: Diagnosis not present

## 2022-02-13 DIAGNOSIS — J310 Chronic rhinitis: Secondary | ICD-10-CM | POA: Diagnosis not present

## 2022-02-14 IMAGING — DX DG SHOULDER 2+V*L*
3 series · 3 of 3 positions shown · non-contrast
Comparison: 06/13/2009

CLINICAL DATA: Fall, left shoulder pain, remote left humerus
fracture ORIF

EXAM:
LEFT SHOULDER - 2+ VIEW

[shoulder internal rotation ap]
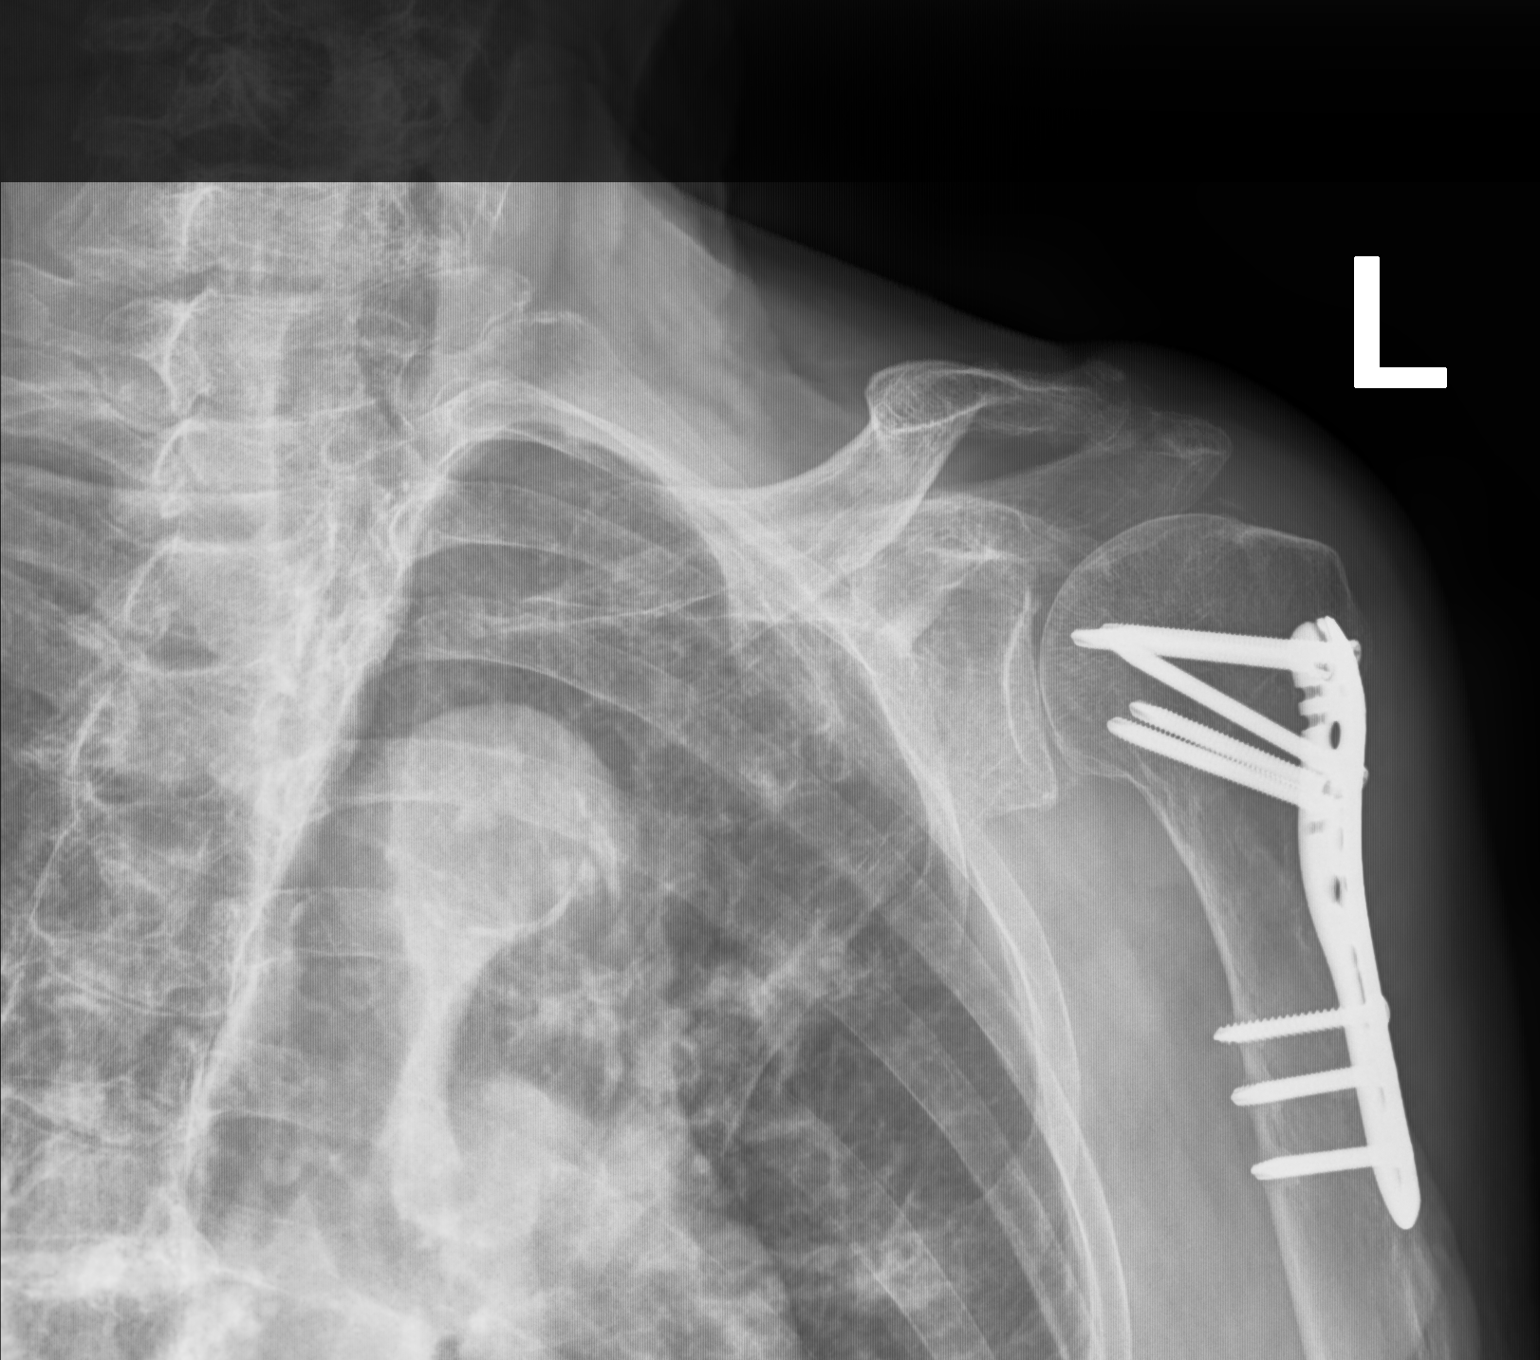

[shoulder (y view)]
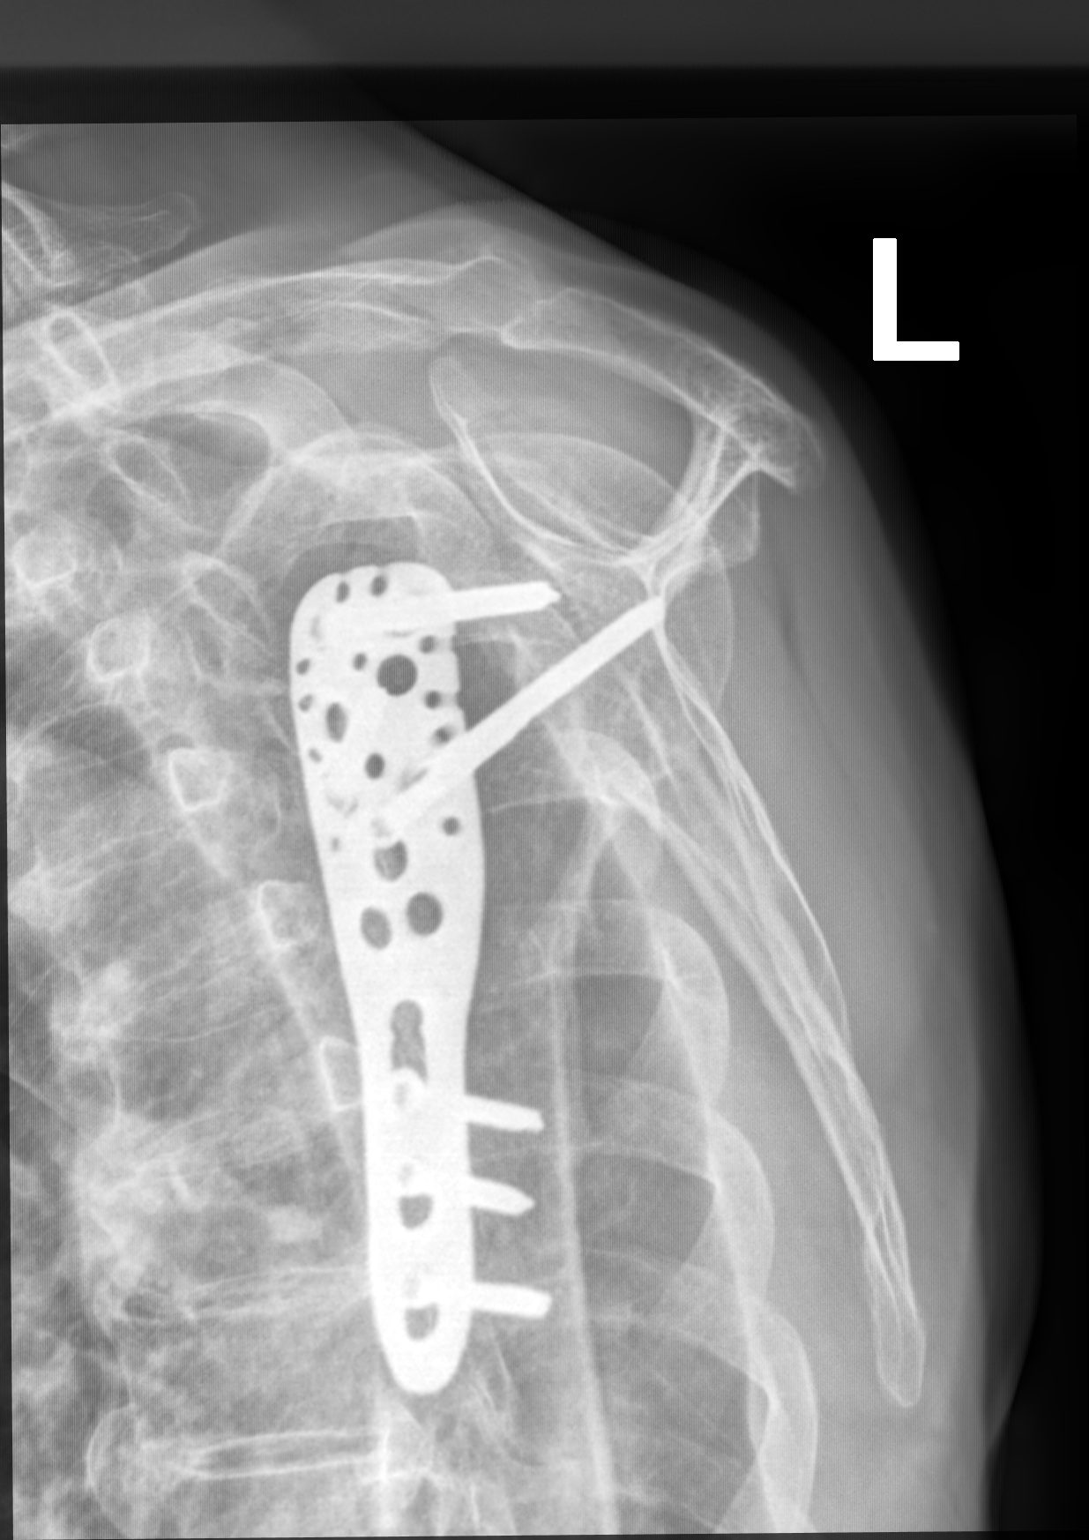

[shoulder (axial)]
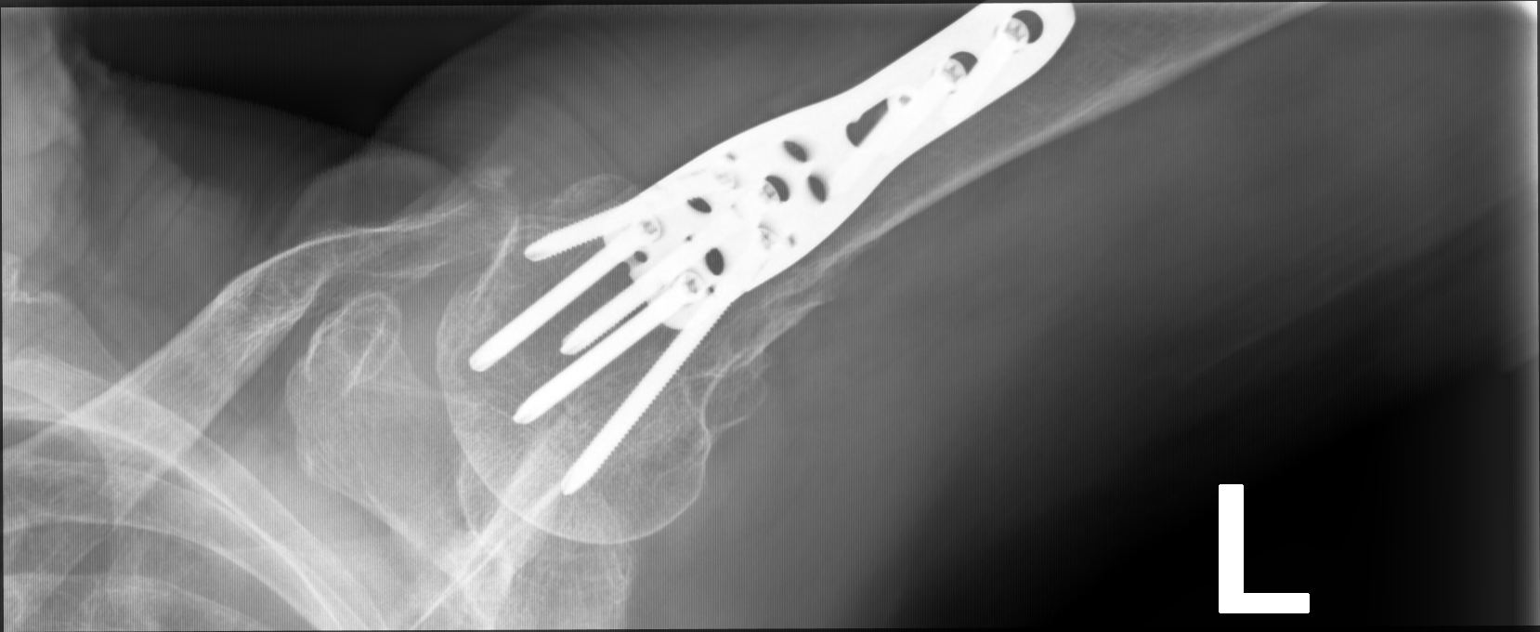

[3 of 3 positions shown; findings below may reference images not displayed]

FINDINGS: Previous ORIF for a left proximal humerus surgical neck fracture.
Normal alignment. Intact hardware. No acute osseous finding or
fracture. AC joint also aligned. No separation. Bones are
osteopenic. Included left chest demonstrates aortic atherosclerosis.
IMPRESSION: Previous left proximal humerus ORIF.

Osteopenia

No acute osseous finding or malalignment.

Aortic Atherosclerosis (TISBA-VPY.Y).

## 2022-03-16 ENCOUNTER — Other Ambulatory Visit: Payer: Self-pay | Admitting: Family Medicine

## 2022-03-29 ENCOUNTER — Telehealth: Payer: Self-pay | Admitting: Oncology

## 2022-03-29 NOTE — Telephone Encounter (Signed)
Called patient regarding upcoming September appointments, patient has been called and notified. 

## 2022-04-10 ENCOUNTER — Encounter: Payer: Self-pay | Admitting: Family Medicine

## 2022-04-10 ENCOUNTER — Ambulatory Visit (INDEPENDENT_AMBULATORY_CARE_PROVIDER_SITE_OTHER): Payer: Medicare Other | Admitting: Family Medicine

## 2022-04-10 VITALS — BP 128/58 | HR 76 | Temp 97.6°F | Ht 63.5 in | Wt 119.6 lb

## 2022-04-10 DIAGNOSIS — M545 Low back pain, unspecified: Secondary | ICD-10-CM | POA: Diagnosis not present

## 2022-04-10 DIAGNOSIS — E7849 Other hyperlipidemia: Secondary | ICD-10-CM | POA: Diagnosis not present

## 2022-04-10 DIAGNOSIS — G8929 Other chronic pain: Secondary | ICD-10-CM | POA: Diagnosis not present

## 2022-04-10 MED ORDER — TRAMADOL HCL 50 MG PO TABS: 50.0000 mg | ORAL_TABLET | Freq: Four times a day (QID) | ORAL | 0 refills | Status: AC | PRN

## 2022-04-10 NOTE — Progress Notes (Signed)
Established Patient Office Visit  Subjective   Patient ID: Briana Fitzgerald, female    DOB: [birth year]  Age: 88 y.o. MRN: [id number]  Chief Complaint  Patient presents with   Health Maintenance    HPI   Seen with multiple issues today as below.  She is accompanied by her husband.  She has tremendous hearing difficulties and has hearing aids but struggles to hear some even with those.  She has history of diastolic heart failure, GERD, osteoporosis, osteoarthritis involving multiple joints, history of TIA, hyperlipidemia  Her major complaint today is difficulty ambulating.  She has complaints of pain in her left "hip ".  On further questioning this seems to be more lower lumbar area radiating into the buttock.  She has some bilateral pain with ambulation.  Improved with lying supine and at rest.  Known degenerative spondylosis.  She had MRI lumbar spine 04-02-2020 was reviewed.  Moderate spinal canal and neural foraminal narrowing L2-3 and also moderate right L1-2 and L5-S1.  Denies any falls.  She takes Tylenol but this does not control her pain well.  Pain is fairly debilitating at times.  She has hyperlipidemia treated with low-dose rosuvastatin 5 mg daily.  Needs follow-up labs.  She had left shoulder surgery several years ago.  Recently saw orthopedist and repeat films.  She has soft tissue mass left deltoid region which is very mobile and nontender.  Stable in size and has been present for sounds like couple years at least.  Past Medical History:  Diagnosis Date   Arthritis    BACK PAIN 07/18/2008   Bowel obstruction ([place])    CONSTIPATION 10/02/2010   GERD 10/02/2010   Headache    IBS (irritable bowel syndrome)    LAMINECTOMY, LUMBAR, HX OF 06/16/2008   OSTEOARTHRITIS 10/02/2010   Pneumonia    RESTLESS LEG SYNDROME 10/02/2010   SACROILIAC JOINT DYSFUNCTION 06/16/2008   Sleep apnea    STYE 10/02/2010   TRANSIENT ISCHEMIC ATTACKS, HX OF 10/02/2010   Past Surgical History:   Procedure Laterality Date   ABDOMINAL HYSTERECTOMY  1979   TAH, fibroids   APOGEE / PERIGEE REPAIR  2005   [place]  2005   catarac,both eyes   LIVER BIOPSY  1997   benign, hemangioma resection   SPINE SURGERY  2006   laminectomy    reports that she has never smoked. She has never used smokeless tobacco. She reports current alcohol use. She reports that she does not use drugs. family history includes Breast cancer in her paternal aunt; Diabetes in her father; Pancreatitis in her brother; Stroke in her father. No Known Allergies  Review of Systems  Constitutional:  Negative for chills and fever.  Respiratory:  Negative for shortness of breath.   Cardiovascular:  Negative for chest pain.  Genitourinary:  Negative for dysuria.  Musculoskeletal:  Positive for back pain and joint pain.      Objective:     BP (!) 128/58 (BP Location: Left Arm, Patient Position: Sitting, Cuff Size: Normal)   Pulse 76   Temp 97.6 F (36.4 C) (Oral)   Ht 5' 3.5" (1.613 m)   Wt 119 lb 9.6 oz (54.3 kg)   SpO2 97%   BMI 20.85 kg/m  BP Readings from Last 3 Encounters:  04/10/22 (!) 128/58  12/07/21 (!) 120/58  08/13/21 (!) 130/54   Wt Readings from Last 3 Encounters:  04/10/22 119 lb 9.6 oz (54.3 kg)  12/07/21 123 lb  3.2 oz (55.9 kg)  08/13/21 127 lb 12.8 oz (58 kg)      Physical Exam Vitals reviewed.  Cardiovascular:     Rate and Rhythm: Normal rate.  Pulmonary:     Effort: Pulmonary effort is normal.     Breath sounds: Normal breath sounds.  Musculoskeletal:     Comments: Trace edema legs bilaterally.  She has significant varicose changes in both lower legs  She has fairly good range of motion both hips.  Neurological:     Mental Status: She is alert.      No results found for any visits on 04/10/22.    The ASCVD Risk score (Arnett DK, et al., 2019) failed to calculate for the following reasons:   The 2019 ASCVD risk score is only valid for ages 81  to 75    Assessment & Plan:   #1 hyperlipidemia.  Patient on Crestor low dosage 5 mg daily.  Recheck lipid and CMP  #2 chronic low back pain.  She has multiple orthopedic complaints.  Suspect she has some lumbar stenosis.  Pain worse with ambulation and improved with rest.  Nonfocal neuro exam this time.  She may have some hip arthritis but suspect this is mostly coming from her back.  Poor pain control with Tylenol.  Avoid nonsteroidals with her age.  We did discuss possible trial of low-dose tramadol 50 mg 1 twice daily as needed.  Reviewed potential side effects including sedation.  Give feedback if this is helping.  She has had multiple injections in the past with limited success.   No follow-ups on file.    Carolann Littler, MD

## 2022-04-11 LAB — COMPREHENSIVE METABOLIC PANEL
ALT: 16 U/L (ref 0–35)
AST: 21 U/L (ref 0–37)
Albumin: 3.9 g/dL (ref 3.5–5.2)
Alkaline Phosphatase: 64 U/L (ref 39–117)
BUN: 14 mg/dL (ref 6–23)
CO2: 31 mEq/L (ref 19–32)
Calcium: 11 mg/dL — ABNORMAL HIGH (ref 8.4–10.5)
Chloride: 104 mEq/L (ref 96–112)
Creatinine, Ser: 0.9 mg/dL (ref 0.40–1.20)
GFR: 57.19 mL/min — ABNORMAL LOW (ref >60.00)
Glucose, Bld: 93 mg/dL (ref 70–99)
Potassium: 4.2 mEq/L (ref 3.5–5.1)
Sodium: 140 mEq/L (ref 135–145)
Total Bilirubin: 1.3 mg/dL — ABNORMAL HIGH (ref 0.2–1.2)
Total Protein: 6.7 g/dL (ref 6.0–8.3)

## 2022-04-11 LAB — LIPID PANEL
Cholesterol: 99 mg/dL (ref 0–200)
HDL: 40.7 mg/dL (ref >39.00)
LDL Cholesterol: 47 mg/dL (ref 0–99)
NonHDL: 58.58
Total CHOL/HDL Ratio: 2
Triglycerides: 56 mg/dL (ref 0.0–149.0)
VLDL: 11.2 mg/dL (ref 0.0–40.0)

## 2022-04-12 NOTE — Addendum Note (Signed)
Addended by: Nilda Riggs on: 04/12/2022 11:26 AM   Modules accepted: Orders

## 2022-04-18 ENCOUNTER — Telehealth: Payer: Self-pay | Admitting: Family Medicine

## 2022-04-18 DIAGNOSIS — M545 Low back pain, unspecified: Secondary | ICD-10-CM

## 2022-04-18 NOTE — Telephone Encounter (Signed)
Pt states she was told provider would order her an xray. Pt checking on progress of this order.

## 2022-04-19 NOTE — Telephone Encounter (Signed)
Orders placed and pt aware

## 2022-04-22 ENCOUNTER — Ambulatory Visit (INDEPENDENT_AMBULATORY_CARE_PROVIDER_SITE_OTHER)
Admission: RE | Admit: 2022-04-22 | Discharge: 2022-04-22 | Disposition: A | Discharge status: Home / Self Care | Payer: Medicare Other | Source: Ambulatory Visit | Attending: Family Medicine | Admitting: Family Medicine

## 2022-04-22 ENCOUNTER — Telehealth: Payer: Self-pay | Admitting: Family Medicine

## 2022-04-22 ENCOUNTER — Other Ambulatory Visit: Payer: Self-pay | Admitting: Family Medicine

## 2022-04-22 DIAGNOSIS — S4992XA Unspecified injury of left shoulder and upper arm, initial encounter: Secondary | ICD-10-CM | POA: Diagnosis not present

## 2022-04-22 DIAGNOSIS — M545 Low back pain, unspecified: Secondary | ICD-10-CM | POA: Diagnosis not present

## 2022-04-22 DIAGNOSIS — G8929 Other chronic pain: Secondary | ICD-10-CM

## 2022-04-22 DIAGNOSIS — M25512 Pain in left shoulder: Secondary | ICD-10-CM

## 2022-04-22 DIAGNOSIS — M19012 Primary osteoarthritis, left shoulder: Secondary | ICD-10-CM | POA: Diagnosis not present

## 2022-04-22 DIAGNOSIS — J984 Other disorders of lung: Secondary | ICD-10-CM | POA: Diagnosis not present

## 2022-04-22 NOTE — Telephone Encounter (Signed)
Patient's son informed of location of [place] lab for his morther X-ray

## 2022-04-22 NOTE — Telephone Encounter (Signed)
Pt's son, [name], called to say he would like to take his mother this afternoon to get her xrays but needs more info before he takes her.  Please call him back at  [phone]

## 2022-04-22 NOTE — Progress Notes (Signed)
X-ray left shoulder ordered for progressive left shoulder pain

## 2022-04-26 ENCOUNTER — Other Ambulatory Visit (INDEPENDENT_AMBULATORY_CARE_PROVIDER_SITE_OTHER): Payer: Medicare Other

## 2022-04-26 NOTE — Addendum Note (Signed)
Addended by: Jodi Marble, Mileena Rothenberger I on: 04/26/2022 03:49 PM   Modules accepted: Orders

## 2022-04-26 NOTE — Addendum Note (Signed)
Addended by: Jodi Marble, Dudley Mages I on: 04/26/2022 03:49 PM   Modules accepted: Orders

## 2022-04-26 NOTE — Addendum Note (Signed)
Addended by: Jodi Marble, Reece Fehnel I on: 04/26/2022 03:49 PM   Modules accepted: Orders

## 2022-04-27 LAB — COMPREHENSIVE METABOLIC PANEL
AG Ratio: 1.5 (calc) (ref 1.0–2.5)
ALT: 16 U/L (ref 6–29)
AST: 19 U/L (ref 10–35)
Albumin: 3.9 g/dL (ref 3.6–5.1)
Alkaline phosphatase (APISO): 72 U/L (ref 37–153)
BUN: 12 mg/dL (ref 7–25)
CO2: 30 mmol/L (ref 20–32)
Calcium: 10.7 mg/dL — ABNORMAL HIGH (ref 8.6–10.4)
Chloride: 104 mmol/L (ref 98–110)
Creat: 0.83 mg/dL (ref 0.60–0.95)
Globulin: 2.6 g/dL (calc) (ref 1.9–3.7)
Glucose, Bld: 113 mg/dL — ABNORMAL HIGH (ref 65–99)
Potassium: 4.5 mmol/L (ref 3.5–5.3)
Sodium: 140 mmol/L (ref 135–146)
Total Bilirubin: 1.3 mg/dL — ABNORMAL HIGH (ref 0.2–1.2)
Total Protein: 6.5 g/dL (ref 6.1–8.1)

## 2022-04-27 LAB — SEDIMENTATION RATE: Sed Rate: 9 mm/h (ref 0–30)

## 2022-04-27 LAB — PTH, INTACT AND CALCIUM
Calcium: 10.7 mg/dL — ABNORMAL HIGH (ref 8.6–10.4)
PTH: 30 pg/mL (ref 16–77)

## 2022-04-27 LAB — TSH: TSH: 2.57 mIU/L (ref 0.40–4.50)

## 2022-04-27 LAB — EXTRA SPECIMEN

## 2022-04-30 ENCOUNTER — Ambulatory Visit: Payer: Self-pay

## 2022-04-30 ENCOUNTER — Ambulatory Visit (INDEPENDENT_AMBULATORY_CARE_PROVIDER_SITE_OTHER): Payer: Medicare Other | Admitting: Family Medicine

## 2022-04-30 VITALS — Ht 63.5 in | Wt 119.2 lb

## 2022-04-30 DIAGNOSIS — G8929 Other chronic pain: Secondary | ICD-10-CM | POA: Diagnosis not present

## 2022-04-30 DIAGNOSIS — M25512 Pain in left shoulder: Secondary | ICD-10-CM

## 2022-04-30 NOTE — Progress Notes (Signed)
I, Peterson Lombard, LAT, ATC acting as a scribe for Lynne Leader, MD.  Subjective:    CC: L arm pain  HPI: Pt is an 88 y/o female c/o L arm pain. Pt was previously seen by Dr. Georgina Snell in 2021 for lumbar radiculitis. Today, pt c/o L arm pain x /. Pt locates pain to the left shoulder.  Pain is located in the left shoulder and upper arm extending from the trapezius region into the neck all the way down to the left upper arm but not extending below the level of the elbow.  Pain is worse with elbow motion but also occurs at night.  Neck pain: Radiates:  UE Numbness/tingling: UE Weakness: Aggravates: Treatments tried:  Dx imaging: 04/22/22 L shoulder & L-spine XR  12/27/20 L shoulder XR  04/02/20 L-spine MRI  02/15/20 L-spine XR  Pertinent review of Systems: No fevers or chills  Relevant historical information: Heart disease.  Scoliosis.  Osteoporosis. History of left proximal humerus fracture requiring ORIF.  Objective:   Wt 119lbs General: Elderly appearing woman with decreased muscle bulk in no acute distress.  MSK: Left shoulder: Decreased muscle bulk. Decreased range of motion.  Abduction limited to 110 degrees.  External rotation 30 degrees beyond neutral position internal rotation lumbar spine. Strength 4/5 abduction external and internal rotation. Positive Hawkins and Neer's test. Tender palpation left trapezius.  Lab and Radiology Results  Procedure: Real-time Ultrasound Guided Injection of left shoulder glenohumeral joint posterior approach Device: Philips Affiniti 50G Images permanently stored and available for review in PACS Ultrasound evaluation prior to injection reveals intact appearing rotator cuff tendons.  Chronic calcific change distal supraspinatus tendon is present.  No visible tear is present.  Biceps tendon difficult to visualize. Verbal informed consent obtained.  Discussed risks and benefits of procedure. Warned about infection, bleeding, hyperglycemia damage  to structures among others. Patient expresses understanding and agreement Time-out conducted.   Noted no overlying erythema, induration, or other signs of local infection.   Skin prepped in a sterile fashion.   Local anesthesia: Topical Ethyl chloride.   With sterile technique and under real time ultrasound guidance: 40 mg of Kenalog and 2 mL of Marcaine injected into glenohumeral joint. Fluid seen entering the joint capsule.   Completed without difficulty   Pain immediately resolved suggesting accurate placement of the medication.   Advised to call if fevers/chills, erythema, induration, drainage, or persistent bleeding.   Images permanently stored and available for review in the ultrasound unit.  Impression: Technically successful ultrasound guided injection.   EXAM: LEFT SHOULDER - 2+ VIEW   COMPARISON:  12/27/2020   FINDINGS: No recent fracture or dislocation is seen. There is previous internal fixation in the proximal left humerus. There are small linear calcifications in the soft tissues adjacent to the greater tuberosity of proximal humerus. Osteopenia is seen bony structures degenerative changes are noted in the left [facility] joint. There are linear densities in left mid lung field, possibly suggesting scarring.   IMPRESSION: No recent fracture or dislocation is seen in left shoulder. Previous internal fixation and left humerus. Linear soft tissue calcifications adjacent to greater tuberosity may suggest calcific bursitis or tendinosis.     Electronically Signed   By: Elmer Picker M.D.   On: 04/23/2022 10:52   I, Lynne Leader, personally (independently) visualized and performed the interpretation of the images attached in this note.     Impression and Recommendations:    Assessment and Plan: 88 y.o. female with left shoulder pain.  Etiology is unclear..  She does have evidence of chronic calcific tendinopathy which could be the source of her pain.  I formed a  glenohumeral injection which did not provide immediate relief indicating that her pain may be more subacromial related.  Hopefully she will get some benefit from the glenohumeral injection.  If not we can consider home health physical therapy or even a subacromial injection.  She will let me know. We talked about medication options for pain.  Unfortunately there is not a lot of great ones.  Tylenol arthritis is probably her best option along with Voltaren gel.  Other options including oral NSAIDs are just not safe and opiates are likely to cause falls.  PDMP not reviewed this encounter. Orders Placed This Encounter  Procedures   [place] LIMITED JOINT SPACE STRUCTURES UP LEFT(NO LINKED CHARGES)    Order Specific Question:   Reason for Exam (SYMPTOM  OR DIAGNOSIS REQUIRED)    Answer:   eval left shoulder    Order Specific Question:   Preferred imaging location?    Answer:   [place]   No orders of the defined types were placed in this encounter.   Discussed warning signs or symptoms. Please see discharge instructions. Patient expresses understanding.   The above documentation has been reviewed and is accurate and complete Lynne Leader, M.D.

## 2022-04-30 NOTE — Patient Instructions (Signed)
Thank you for coming in today.   You received an injection today. Seek immediate medical attention if the joint becomes red, extremely painful, or is oozing fluid.   If not improving, let me know and we can refer you to home health physical therapy.  Check back as needed

## 2022-05-09 ENCOUNTER — Telehealth: Payer: Self-pay | Admitting: Family Medicine

## 2022-05-09 NOTE — Telephone Encounter (Signed)
Pt husband called ( wife hard of hearing ). Wanted to report that the steroid inj given at last visit has not helped. Pt interested in PT if that is the next plan.

## 2022-05-10 ENCOUNTER — Other Ambulatory Visit: Payer: Self-pay

## 2022-05-10 DIAGNOSIS — G8929 Other chronic pain: Secondary | ICD-10-CM

## 2022-05-10 NOTE — Telephone Encounter (Signed)
Home Health PT referral placed. Called husband back and left a VM informing him.

## 2022-05-16 DIAGNOSIS — M722 Plantar fascial fibromatosis: Secondary | ICD-10-CM | POA: Diagnosis not present

## 2022-05-16 DIAGNOSIS — G4733 Obstructive sleep apnea (adult) (pediatric): Secondary | ICD-10-CM | POA: Diagnosis not present

## 2022-05-16 DIAGNOSIS — M25512 Pain in left shoulder: Secondary | ICD-10-CM | POA: Diagnosis not present

## 2022-05-16 DIAGNOSIS — I509 Heart failure, unspecified: Secondary | ICD-10-CM | POA: Diagnosis not present

## 2022-05-16 DIAGNOSIS — K219 Gastro-esophageal reflux disease without esophagitis: Secondary | ICD-10-CM | POA: Diagnosis not present

## 2022-05-16 DIAGNOSIS — G4700 Insomnia, unspecified: Secondary | ICD-10-CM | POA: Diagnosis not present

## 2022-05-16 DIAGNOSIS — G2581 Restless legs syndrome: Secondary | ICD-10-CM | POA: Diagnosis not present

## 2022-05-16 DIAGNOSIS — Z79899 Other long term (current) drug therapy: Secondary | ICD-10-CM | POA: Diagnosis not present

## 2022-05-16 DIAGNOSIS — Z681 Body mass index (BMI) 19 or less, adult: Secondary | ICD-10-CM | POA: Diagnosis not present

## 2022-05-16 DIAGNOSIS — M549 Dorsalgia, unspecified: Secondary | ICD-10-CM | POA: Diagnosis not present

## 2022-05-16 DIAGNOSIS — M1990 Unspecified osteoarthritis, unspecified site: Secondary | ICD-10-CM | POA: Diagnosis not present

## 2022-05-16 DIAGNOSIS — S46912D Strain of unspecified muscle, fascia and tendon at shoulder and upper arm level, left arm, subsequent encounter: Secondary | ICD-10-CM | POA: Diagnosis not present

## 2022-05-16 DIAGNOSIS — I700 Atherosclerosis of aorta: Secondary | ICD-10-CM | POA: Diagnosis not present

## 2022-05-16 DIAGNOSIS — M810 Age-related osteoporosis without current pathological fracture: Secondary | ICD-10-CM | POA: Diagnosis not present

## 2022-05-16 DIAGNOSIS — G8929 Other chronic pain: Secondary | ICD-10-CM | POA: Diagnosis not present

## 2022-05-16 DIAGNOSIS — N183 Chronic kidney disease, stage 3 unspecified: Secondary | ICD-10-CM | POA: Diagnosis not present

## 2022-05-16 DIAGNOSIS — M5030 Other cervical disc degeneration, unspecified cervical region: Secondary | ICD-10-CM | POA: Diagnosis not present

## 2022-05-16 DIAGNOSIS — E785 Hyperlipidemia, unspecified: Secondary | ICD-10-CM | POA: Diagnosis not present

## 2022-05-16 DIAGNOSIS — E559 Vitamin D deficiency, unspecified: Secondary | ICD-10-CM | POA: Diagnosis not present

## 2022-05-16 DIAGNOSIS — K5900 Constipation, unspecified: Secondary | ICD-10-CM | POA: Diagnosis not present

## 2022-05-16 DIAGNOSIS — Z7951 Long term (current) use of inhaled steroids: Secondary | ICD-10-CM | POA: Diagnosis not present

## 2022-05-16 DIAGNOSIS — J45909 Unspecified asthma, uncomplicated: Secondary | ICD-10-CM | POA: Diagnosis not present

## 2022-05-16 DIAGNOSIS — M4120 Other idiopathic scoliosis, site unspecified: Secondary | ICD-10-CM | POA: Diagnosis not present

## 2022-05-17 ENCOUNTER — Telehealth: Payer: Self-pay | Admitting: Family Medicine

## 2022-05-17 NOTE — Telephone Encounter (Signed)
Briana Fitzgerald from [place] call and stated he need a verbal order for PT 1 X  a WK for 1 WK and 2 x a WK  for 4 Wk's also he stated you can leave him a voice message it is secure.

## 2022-05-22 NOTE — Telephone Encounter (Signed)
Left detailed message informing Mark of verbal orders 

## 2022-05-22 NOTE — Telephone Encounter (Signed)
Briana Fitzgerald / [name]  [phone]  States he is still waiting for VO approval.  Please advise.

## 2022-05-24 DIAGNOSIS — M5030 Other cervical disc degeneration, unspecified cervical region: Secondary | ICD-10-CM | POA: Diagnosis not present

## 2022-05-24 DIAGNOSIS — M810 Age-related osteoporosis without current pathological fracture: Secondary | ICD-10-CM | POA: Diagnosis not present

## 2022-05-24 DIAGNOSIS — G8929 Other chronic pain: Secondary | ICD-10-CM | POA: Diagnosis not present

## 2022-05-24 DIAGNOSIS — M722 Plantar fascial fibromatosis: Secondary | ICD-10-CM | POA: Diagnosis not present

## 2022-05-24 DIAGNOSIS — K5900 Constipation, unspecified: Secondary | ICD-10-CM | POA: Diagnosis not present

## 2022-05-24 DIAGNOSIS — G4700 Insomnia, unspecified: Secondary | ICD-10-CM | POA: Diagnosis not present

## 2022-05-24 DIAGNOSIS — M549 Dorsalgia, unspecified: Secondary | ICD-10-CM | POA: Diagnosis not present

## 2022-05-24 DIAGNOSIS — I509 Heart failure, unspecified: Secondary | ICD-10-CM | POA: Diagnosis not present

## 2022-05-24 DIAGNOSIS — M25512 Pain in left shoulder: Secondary | ICD-10-CM | POA: Diagnosis not present

## 2022-05-24 DIAGNOSIS — E559 Vitamin D deficiency, unspecified: Secondary | ICD-10-CM | POA: Diagnosis not present

## 2022-05-24 DIAGNOSIS — G4733 Obstructive sleep apnea (adult) (pediatric): Secondary | ICD-10-CM | POA: Diagnosis not present

## 2022-05-24 DIAGNOSIS — Z79899 Other long term (current) drug therapy: Secondary | ICD-10-CM | POA: Diagnosis not present

## 2022-05-24 DIAGNOSIS — M1990 Unspecified osteoarthritis, unspecified site: Secondary | ICD-10-CM | POA: Diagnosis not present

## 2022-05-24 DIAGNOSIS — J45909 Unspecified asthma, uncomplicated: Secondary | ICD-10-CM | POA: Diagnosis not present

## 2022-05-24 DIAGNOSIS — K219 Gastro-esophageal reflux disease without esophagitis: Secondary | ICD-10-CM | POA: Diagnosis not present

## 2022-05-24 DIAGNOSIS — Z681 Body mass index (BMI) 19 or less, adult: Secondary | ICD-10-CM | POA: Diagnosis not present

## 2022-05-24 DIAGNOSIS — Z7951 Long term (current) use of inhaled steroids: Secondary | ICD-10-CM | POA: Diagnosis not present

## 2022-05-24 DIAGNOSIS — I700 Atherosclerosis of aorta: Secondary | ICD-10-CM | POA: Diagnosis not present

## 2022-05-24 DIAGNOSIS — M4120 Other idiopathic scoliosis, site unspecified: Secondary | ICD-10-CM | POA: Diagnosis not present

## 2022-05-24 DIAGNOSIS — S46912D Strain of unspecified muscle, fascia and tendon at shoulder and upper arm level, left arm, subsequent encounter: Secondary | ICD-10-CM | POA: Diagnosis not present

## 2022-05-24 DIAGNOSIS — G2581 Restless legs syndrome: Secondary | ICD-10-CM | POA: Diagnosis not present

## 2022-05-24 DIAGNOSIS — N183 Chronic kidney disease, stage 3 unspecified: Secondary | ICD-10-CM | POA: Diagnosis not present

## 2022-05-24 DIAGNOSIS — E785 Hyperlipidemia, unspecified: Secondary | ICD-10-CM | POA: Diagnosis not present

## 2022-05-31 DIAGNOSIS — I509 Heart failure, unspecified: Secondary | ICD-10-CM | POA: Diagnosis not present

## 2022-05-31 DIAGNOSIS — Z681 Body mass index (BMI) 19 or less, adult: Secondary | ICD-10-CM | POA: Diagnosis not present

## 2022-05-31 DIAGNOSIS — K5900 Constipation, unspecified: Secondary | ICD-10-CM | POA: Diagnosis not present

## 2022-05-31 DIAGNOSIS — M722 Plantar fascial fibromatosis: Secondary | ICD-10-CM | POA: Diagnosis not present

## 2022-05-31 DIAGNOSIS — E785 Hyperlipidemia, unspecified: Secondary | ICD-10-CM | POA: Diagnosis not present

## 2022-05-31 DIAGNOSIS — M810 Age-related osteoporosis without current pathological fracture: Secondary | ICD-10-CM | POA: Diagnosis not present

## 2022-05-31 DIAGNOSIS — Z79899 Other long term (current) drug therapy: Secondary | ICD-10-CM | POA: Diagnosis not present

## 2022-05-31 DIAGNOSIS — K219 Gastro-esophageal reflux disease without esophagitis: Secondary | ICD-10-CM | POA: Diagnosis not present

## 2022-05-31 DIAGNOSIS — E559 Vitamin D deficiency, unspecified: Secondary | ICD-10-CM | POA: Diagnosis not present

## 2022-05-31 DIAGNOSIS — S46912D Strain of unspecified muscle, fascia and tendon at shoulder and upper arm level, left arm, subsequent encounter: Secondary | ICD-10-CM | POA: Diagnosis not present

## 2022-05-31 DIAGNOSIS — G4733 Obstructive sleep apnea (adult) (pediatric): Secondary | ICD-10-CM | POA: Diagnosis not present

## 2022-05-31 DIAGNOSIS — M5030 Other cervical disc degeneration, unspecified cervical region: Secondary | ICD-10-CM | POA: Diagnosis not present

## 2022-05-31 DIAGNOSIS — M25512 Pain in left shoulder: Secondary | ICD-10-CM | POA: Diagnosis not present

## 2022-05-31 DIAGNOSIS — I700 Atherosclerosis of aorta: Secondary | ICD-10-CM | POA: Diagnosis not present

## 2022-05-31 DIAGNOSIS — Z7951 Long term (current) use of inhaled steroids: Secondary | ICD-10-CM | POA: Diagnosis not present

## 2022-05-31 DIAGNOSIS — M4120 Other idiopathic scoliosis, site unspecified: Secondary | ICD-10-CM | POA: Diagnosis not present

## 2022-05-31 DIAGNOSIS — G8929 Other chronic pain: Secondary | ICD-10-CM | POA: Diagnosis not present

## 2022-05-31 DIAGNOSIS — G4700 Insomnia, unspecified: Secondary | ICD-10-CM | POA: Diagnosis not present

## 2022-05-31 DIAGNOSIS — J45909 Unspecified asthma, uncomplicated: Secondary | ICD-10-CM | POA: Diagnosis not present

## 2022-05-31 DIAGNOSIS — G2581 Restless legs syndrome: Secondary | ICD-10-CM | POA: Diagnosis not present

## 2022-05-31 DIAGNOSIS — N183 Chronic kidney disease, stage 3 unspecified: Secondary | ICD-10-CM | POA: Diagnosis not present

## 2022-05-31 DIAGNOSIS — M549 Dorsalgia, unspecified: Secondary | ICD-10-CM | POA: Diagnosis not present

## 2022-05-31 DIAGNOSIS — M1990 Unspecified osteoarthritis, unspecified site: Secondary | ICD-10-CM | POA: Diagnosis not present

## 2022-06-03 DIAGNOSIS — M5030 Other cervical disc degeneration, unspecified cervical region: Secondary | ICD-10-CM | POA: Diagnosis not present

## 2022-06-03 DIAGNOSIS — G2581 Restless legs syndrome: Secondary | ICD-10-CM | POA: Diagnosis not present

## 2022-06-03 DIAGNOSIS — G4733 Obstructive sleep apnea (adult) (pediatric): Secondary | ICD-10-CM | POA: Diagnosis not present

## 2022-06-03 DIAGNOSIS — I700 Atherosclerosis of aorta: Secondary | ICD-10-CM | POA: Diagnosis not present

## 2022-06-03 DIAGNOSIS — Z7951 Long term (current) use of inhaled steroids: Secondary | ICD-10-CM | POA: Diagnosis not present

## 2022-06-03 DIAGNOSIS — N183 Chronic kidney disease, stage 3 unspecified: Secondary | ICD-10-CM | POA: Diagnosis not present

## 2022-06-03 DIAGNOSIS — E559 Vitamin D deficiency, unspecified: Secondary | ICD-10-CM | POA: Diagnosis not present

## 2022-06-03 DIAGNOSIS — Z681 Body mass index (BMI) 19 or less, adult: Secondary | ICD-10-CM | POA: Diagnosis not present

## 2022-06-03 DIAGNOSIS — J45909 Unspecified asthma, uncomplicated: Secondary | ICD-10-CM | POA: Diagnosis not present

## 2022-06-03 DIAGNOSIS — G4700 Insomnia, unspecified: Secondary | ICD-10-CM | POA: Diagnosis not present

## 2022-06-03 DIAGNOSIS — K5900 Constipation, unspecified: Secondary | ICD-10-CM | POA: Diagnosis not present

## 2022-06-03 DIAGNOSIS — G8929 Other chronic pain: Secondary | ICD-10-CM | POA: Diagnosis not present

## 2022-06-03 DIAGNOSIS — K219 Gastro-esophageal reflux disease without esophagitis: Secondary | ICD-10-CM | POA: Diagnosis not present

## 2022-06-03 DIAGNOSIS — E785 Hyperlipidemia, unspecified: Secondary | ICD-10-CM | POA: Diagnosis not present

## 2022-06-03 DIAGNOSIS — I509 Heart failure, unspecified: Secondary | ICD-10-CM | POA: Diagnosis not present

## 2022-06-03 DIAGNOSIS — M25512 Pain in left shoulder: Secondary | ICD-10-CM | POA: Diagnosis not present

## 2022-06-03 DIAGNOSIS — S46912D Strain of unspecified muscle, fascia and tendon at shoulder and upper arm level, left arm, subsequent encounter: Secondary | ICD-10-CM | POA: Diagnosis not present

## 2022-06-03 DIAGNOSIS — Z79899 Other long term (current) drug therapy: Secondary | ICD-10-CM | POA: Diagnosis not present

## 2022-06-03 DIAGNOSIS — M810 Age-related osteoporosis without current pathological fracture: Secondary | ICD-10-CM | POA: Diagnosis not present

## 2022-06-03 DIAGNOSIS — M1990 Unspecified osteoarthritis, unspecified site: Secondary | ICD-10-CM | POA: Diagnosis not present

## 2022-06-03 DIAGNOSIS — M722 Plantar fascial fibromatosis: Secondary | ICD-10-CM | POA: Diagnosis not present

## 2022-06-03 DIAGNOSIS — M549 Dorsalgia, unspecified: Secondary | ICD-10-CM | POA: Diagnosis not present

## 2022-06-03 DIAGNOSIS — M4120 Other idiopathic scoliosis, site unspecified: Secondary | ICD-10-CM | POA: Diagnosis not present

## 2022-06-06 DIAGNOSIS — E559 Vitamin D deficiency, unspecified: Secondary | ICD-10-CM | POA: Diagnosis not present

## 2022-06-06 DIAGNOSIS — M1990 Unspecified osteoarthritis, unspecified site: Secondary | ICD-10-CM | POA: Diagnosis not present

## 2022-06-06 DIAGNOSIS — N183 Chronic kidney disease, stage 3 unspecified: Secondary | ICD-10-CM | POA: Diagnosis not present

## 2022-06-06 DIAGNOSIS — I700 Atherosclerosis of aorta: Secondary | ICD-10-CM | POA: Diagnosis not present

## 2022-06-06 DIAGNOSIS — M25512 Pain in left shoulder: Secondary | ICD-10-CM | POA: Diagnosis not present

## 2022-06-06 DIAGNOSIS — G4700 Insomnia, unspecified: Secondary | ICD-10-CM | POA: Diagnosis not present

## 2022-06-06 DIAGNOSIS — M722 Plantar fascial fibromatosis: Secondary | ICD-10-CM | POA: Diagnosis not present

## 2022-06-06 DIAGNOSIS — I509 Heart failure, unspecified: Secondary | ICD-10-CM | POA: Diagnosis not present

## 2022-06-06 DIAGNOSIS — G4733 Obstructive sleep apnea (adult) (pediatric): Secondary | ICD-10-CM | POA: Diagnosis not present

## 2022-06-06 DIAGNOSIS — Z681 Body mass index (BMI) 19 or less, adult: Secondary | ICD-10-CM | POA: Diagnosis not present

## 2022-06-06 DIAGNOSIS — M549 Dorsalgia, unspecified: Secondary | ICD-10-CM | POA: Diagnosis not present

## 2022-06-06 DIAGNOSIS — M5030 Other cervical disc degeneration, unspecified cervical region: Secondary | ICD-10-CM | POA: Diagnosis not present

## 2022-06-06 DIAGNOSIS — G8929 Other chronic pain: Secondary | ICD-10-CM | POA: Diagnosis not present

## 2022-06-06 DIAGNOSIS — M4120 Other idiopathic scoliosis, site unspecified: Secondary | ICD-10-CM | POA: Diagnosis not present

## 2022-06-06 DIAGNOSIS — J45909 Unspecified asthma, uncomplicated: Secondary | ICD-10-CM | POA: Diagnosis not present

## 2022-06-06 DIAGNOSIS — Z79899 Other long term (current) drug therapy: Secondary | ICD-10-CM | POA: Diagnosis not present

## 2022-06-06 DIAGNOSIS — M810 Age-related osteoporosis without current pathological fracture: Secondary | ICD-10-CM | POA: Diagnosis not present

## 2022-06-06 DIAGNOSIS — Z7951 Long term (current) use of inhaled steroids: Secondary | ICD-10-CM | POA: Diagnosis not present

## 2022-06-06 DIAGNOSIS — K219 Gastro-esophageal reflux disease without esophagitis: Secondary | ICD-10-CM | POA: Diagnosis not present

## 2022-06-06 DIAGNOSIS — K5900 Constipation, unspecified: Secondary | ICD-10-CM | POA: Diagnosis not present

## 2022-06-06 DIAGNOSIS — S46912D Strain of unspecified muscle, fascia and tendon at shoulder and upper arm level, left arm, subsequent encounter: Secondary | ICD-10-CM | POA: Diagnosis not present

## 2022-06-06 DIAGNOSIS — E785 Hyperlipidemia, unspecified: Secondary | ICD-10-CM | POA: Diagnosis not present

## 2022-06-06 DIAGNOSIS — G2581 Restless legs syndrome: Secondary | ICD-10-CM | POA: Diagnosis not present

## 2022-06-10 DIAGNOSIS — M5030 Other cervical disc degeneration, unspecified cervical region: Secondary | ICD-10-CM | POA: Diagnosis not present

## 2022-06-10 DIAGNOSIS — M549 Dorsalgia, unspecified: Secondary | ICD-10-CM | POA: Diagnosis not present

## 2022-06-10 DIAGNOSIS — K219 Gastro-esophageal reflux disease without esophagitis: Secondary | ICD-10-CM | POA: Diagnosis not present

## 2022-06-10 DIAGNOSIS — S46912D Strain of unspecified muscle, fascia and tendon at shoulder and upper arm level, left arm, subsequent encounter: Secondary | ICD-10-CM | POA: Diagnosis not present

## 2022-06-10 DIAGNOSIS — E785 Hyperlipidemia, unspecified: Secondary | ICD-10-CM | POA: Diagnosis not present

## 2022-06-10 DIAGNOSIS — M25512 Pain in left shoulder: Secondary | ICD-10-CM | POA: Diagnosis not present

## 2022-06-10 DIAGNOSIS — G8929 Other chronic pain: Secondary | ICD-10-CM | POA: Diagnosis not present

## 2022-06-10 DIAGNOSIS — M1990 Unspecified osteoarthritis, unspecified site: Secondary | ICD-10-CM | POA: Diagnosis not present

## 2022-06-10 DIAGNOSIS — G4733 Obstructive sleep apnea (adult) (pediatric): Secondary | ICD-10-CM | POA: Diagnosis not present

## 2022-06-10 DIAGNOSIS — M4120 Other idiopathic scoliosis, site unspecified: Secondary | ICD-10-CM | POA: Diagnosis not present

## 2022-06-10 DIAGNOSIS — M722 Plantar fascial fibromatosis: Secondary | ICD-10-CM | POA: Diagnosis not present

## 2022-06-10 DIAGNOSIS — M810 Age-related osteoporosis without current pathological fracture: Secondary | ICD-10-CM | POA: Diagnosis not present

## 2022-06-10 DIAGNOSIS — N183 Chronic kidney disease, stage 3 unspecified: Secondary | ICD-10-CM | POA: Diagnosis not present

## 2022-06-10 DIAGNOSIS — Z681 Body mass index (BMI) 19 or less, adult: Secondary | ICD-10-CM | POA: Diagnosis not present

## 2022-06-10 DIAGNOSIS — I509 Heart failure, unspecified: Secondary | ICD-10-CM | POA: Diagnosis not present

## 2022-06-10 DIAGNOSIS — E559 Vitamin D deficiency, unspecified: Secondary | ICD-10-CM | POA: Diagnosis not present

## 2022-06-10 DIAGNOSIS — I700 Atherosclerosis of aorta: Secondary | ICD-10-CM | POA: Diagnosis not present

## 2022-06-10 DIAGNOSIS — Z7951 Long term (current) use of inhaled steroids: Secondary | ICD-10-CM | POA: Diagnosis not present

## 2022-06-10 DIAGNOSIS — G2581 Restless legs syndrome: Secondary | ICD-10-CM | POA: Diagnosis not present

## 2022-06-10 DIAGNOSIS — G4700 Insomnia, unspecified: Secondary | ICD-10-CM | POA: Diagnosis not present

## 2022-06-10 DIAGNOSIS — J45909 Unspecified asthma, uncomplicated: Secondary | ICD-10-CM | POA: Diagnosis not present

## 2022-06-10 DIAGNOSIS — K5900 Constipation, unspecified: Secondary | ICD-10-CM | POA: Diagnosis not present

## 2022-06-10 DIAGNOSIS — Z79899 Other long term (current) drug therapy: Secondary | ICD-10-CM | POA: Diagnosis not present

## 2022-06-11 ENCOUNTER — Inpatient Hospital Stay: Payer: Medicare Other | Admitting: Oncology

## 2022-06-11 ENCOUNTER — Telehealth: Payer: Self-pay | Admitting: *Deleted

## 2022-06-11 ENCOUNTER — Inpatient Hospital Stay: Payer: Medicare Other

## 2022-06-11 NOTE — Telephone Encounter (Signed)
PC to patient regarding missed appointments this a.m., she said she doesn't have a car or anyone to drive her to appointments right now, she will contact [place] when she is ready to schedule an appointment.

## 2022-06-14 DIAGNOSIS — I509 Heart failure, unspecified: Secondary | ICD-10-CM | POA: Diagnosis not present

## 2022-06-14 DIAGNOSIS — K5900 Constipation, unspecified: Secondary | ICD-10-CM | POA: Diagnosis not present

## 2022-06-14 DIAGNOSIS — N183 Chronic kidney disease, stage 3 unspecified: Secondary | ICD-10-CM | POA: Diagnosis not present

## 2022-06-14 DIAGNOSIS — M5030 Other cervical disc degeneration, unspecified cervical region: Secondary | ICD-10-CM | POA: Diagnosis not present

## 2022-06-14 DIAGNOSIS — S46912D Strain of unspecified muscle, fascia and tendon at shoulder and upper arm level, left arm, subsequent encounter: Secondary | ICD-10-CM | POA: Diagnosis not present

## 2022-06-14 DIAGNOSIS — Z79899 Other long term (current) drug therapy: Secondary | ICD-10-CM | POA: Diagnosis not present

## 2022-06-14 DIAGNOSIS — G4733 Obstructive sleep apnea (adult) (pediatric): Secondary | ICD-10-CM | POA: Diagnosis not present

## 2022-06-14 DIAGNOSIS — E559 Vitamin D deficiency, unspecified: Secondary | ICD-10-CM | POA: Diagnosis not present

## 2022-06-14 DIAGNOSIS — Z681 Body mass index (BMI) 19 or less, adult: Secondary | ICD-10-CM | POA: Diagnosis not present

## 2022-06-14 DIAGNOSIS — G4700 Insomnia, unspecified: Secondary | ICD-10-CM | POA: Diagnosis not present

## 2022-06-14 DIAGNOSIS — M810 Age-related osteoporosis without current pathological fracture: Secondary | ICD-10-CM | POA: Diagnosis not present

## 2022-06-14 DIAGNOSIS — M549 Dorsalgia, unspecified: Secondary | ICD-10-CM | POA: Diagnosis not present

## 2022-06-14 DIAGNOSIS — E785 Hyperlipidemia, unspecified: Secondary | ICD-10-CM | POA: Diagnosis not present

## 2022-06-14 DIAGNOSIS — Z7951 Long term (current) use of inhaled steroids: Secondary | ICD-10-CM | POA: Diagnosis not present

## 2022-06-14 DIAGNOSIS — M25512 Pain in left shoulder: Secondary | ICD-10-CM | POA: Diagnosis not present

## 2022-06-14 DIAGNOSIS — M722 Plantar fascial fibromatosis: Secondary | ICD-10-CM | POA: Diagnosis not present

## 2022-06-14 DIAGNOSIS — I700 Atherosclerosis of aorta: Secondary | ICD-10-CM | POA: Diagnosis not present

## 2022-06-14 DIAGNOSIS — J45909 Unspecified asthma, uncomplicated: Secondary | ICD-10-CM | POA: Diagnosis not present

## 2022-06-14 DIAGNOSIS — G8929 Other chronic pain: Secondary | ICD-10-CM | POA: Diagnosis not present

## 2022-06-14 DIAGNOSIS — G2581 Restless legs syndrome: Secondary | ICD-10-CM | POA: Diagnosis not present

## 2022-06-14 DIAGNOSIS — M4120 Other idiopathic scoliosis, site unspecified: Secondary | ICD-10-CM | POA: Diagnosis not present

## 2022-06-14 DIAGNOSIS — M1990 Unspecified osteoarthritis, unspecified site: Secondary | ICD-10-CM | POA: Diagnosis not present

## 2022-06-14 DIAGNOSIS — K219 Gastro-esophageal reflux disease without esophagitis: Secondary | ICD-10-CM | POA: Diagnosis not present

## 2022-07-25 ENCOUNTER — Ambulatory Visit: Payer: Medicare Other | Admitting: Family Medicine

## 2022-07-25 VITALS — BP 130/52 | HR 80 | Ht 63.5 in | Wt 121.0 lb

## 2022-07-25 DIAGNOSIS — M545 Low back pain, unspecified: Secondary | ICD-10-CM

## 2022-07-25 DIAGNOSIS — G8929 Other chronic pain: Secondary | ICD-10-CM

## 2022-07-25 DIAGNOSIS — M7061 Trochanteric bursitis, right hip: Secondary | ICD-10-CM

## 2022-07-25 MED ORDER — DICLOFENAC SODIUM 25 MG PO TBEC: 25.0000 mg | DELAYED_RELEASE_TABLET | Freq: Two times a day (BID) | ORAL | 1 refills | Status: DC | PRN

## 2022-07-25 NOTE — Progress Notes (Signed)
   I, Peterson Lombard, LAT, ATC acting as a scribe for Lynne Leader, MD.  Briana Fitzgerald is a 88 y.o. female who presents to [place] at [facility] today for back and R hip pain.  Patient was last seen by Dr. Georgina Snell for lumbar radiculopathy on 04/06/20 and an ESI was ordered and later performed on 04/14/20.  Today, patient reports R hip and back pain that has flared up over the last few weeks. Patient locates pain to the R-side of her low back, into R buttock and slightly into the posterior R thigh. Pt's son notes that her gait is unsteady and she seems off balance.  Radiating pain: yes LE numbness/tingling: no LE weakness: yes Aggravates: walking, standing Treatments tried: Tylenol, Voltaren gel,   Dx imaging: 04/22/22 L-spine XR  04/02/20 L-spine MRI  02/15/20 L-spine XR  Pertinent review of systems: no fever or chills  Relevant historical information: heart failure   Exam:  BP (!) 130/52   Pulse 80   Ht 5' 3.5" (1.613 m)   Wt 121 lb (54.9 kg)   SpO2 98%   BMI 21.10 kg/m  General: Well Developed, well nourished, and in no acute distress.   MSK: Lspine: normal appearing. Non-tender midline. TTP right lumbar paraspinal muscles  Decreased motion Lspine.   Right Hip normal-appearing Tender palpation greater trochanter.  Hip abduction strength is reduced.  Hip range of motion is reduced.  Patient walks with an antalgic gait using a cane to ambulate.    Lab and Radiology Results  Hip greater trochanteric injection: Right Consent obtained and timeout performed. Area of maximum tenderness palpated and identified. Skin cleaned with alcohol, cold spray applied. A 22g needle was used to access the greater trochanteric bursa. '40mg'$  of Kenalog and 2 mL of Marcaine were used to inject the trochanteric bursa. Patient tolerated the procedure well.      Assessment and Plan: 88 y.o. female with Right lateral right lateral hip pain thought to be related to hip  abductor tendinopathy and trochanteric bursitis.  Additionally she has low back pain which is acute exacerbation of a chronic problem.  Fluid pain thought to be related to muscle spasm and dysfunction and degenerative changes seen previously on MRI.  Plan for greater trochanter bursa injection today.  Could repeat epidural steroid injection or pursue facet injections for the lumbar pain in the future if needed.  She will let me know.  A good starting point would be to repeat the epidural steroid injection from 2021 ( right L5 nerve root block and transforaminal epidural). Recheck back as needed   PDMP not reviewed this encounter. No orders of the defined types were placed in this encounter.  Meds ordered this encounter  Medications   diclofenac (VOLTAREN) 25 MG EC tablet    Sig: Take 1-2 tablets (25-50 mg total) by mouth 2 (two) times daily as needed for moderate pain.    Dispense:  60 tablet    Refill:  1     Discussed warning signs or symptoms. Please see discharge instructions. Patient expresses understanding.   The above documentation has been reviewed and is accurate and complete Lynne Leader, M.D.

## 2022-07-25 NOTE — Patient Instructions (Signed)
Thank you for coming in today.   Call or go to the ER if you develop a large red swollen joint with extreme pain or oozing puss.    If the back pain continues I can arrange for an injection in your back. Let me know.   Ok to take voltaren pills 1-2 pills 2x daily as needed for pain.

## 2022-07-29 ENCOUNTER — Telehealth: Payer: Self-pay

## 2022-07-29 DIAGNOSIS — G8929 Other chronic pain: Secondary | ICD-10-CM

## 2022-07-29 NOTE — Telephone Encounter (Signed)
Epidural steroid injection/nerve root block ordered.  [place] imaging phone number is [phone].

## 2022-07-29 NOTE — Telephone Encounter (Signed)
Spoke to pt and her husband and relayed this message and provided the # for [facility] to schedule the ESI. They verbalized understanding.

## 2022-07-29 NOTE — Telephone Encounter (Signed)
Pt called and reported the GT steroid injection did not provide any pain relief. Pt has an older lumbar MRI. Please advised if repeat ESI is advisable.

## 2022-07-29 NOTE — Addendum Note (Signed)
Addended by: Gregor Hams on: 07/29/2022 11:37 AM   Modules accepted: Orders

## 2022-08-05 ENCOUNTER — Ambulatory Visit
Admission: RE | Admit: 2022-08-05 | Discharge: 2022-08-05 | Disposition: A | Discharge status: Home / Self Care | Payer: Medicare Other | Source: Ambulatory Visit | Attending: Family Medicine | Admitting: Family Medicine

## 2022-08-05 DIAGNOSIS — M545 Low back pain, unspecified: Secondary | ICD-10-CM

## 2022-08-05 DIAGNOSIS — M4727 Other spondylosis with radiculopathy, lumbosacral region: Secondary | ICD-10-CM | POA: Diagnosis not present

## 2022-08-05 MED ORDER — IOPAMIDOL (ISOVUE-M 200) INJECTION 41%
1.0000 mL | Freq: Once | INTRAMUSCULAR | Status: AC
  Administered 2022-08-05: 1 mL via EPIDURAL

## 2022-08-05 MED ORDER — METHYLPREDNISOLONE ACETATE 40 MG/ML INJ SUSP (RADIOLOG
80.0000 mg | Freq: Once | INTRAMUSCULAR | Status: AC
  Administered 2022-08-05: 80 mg via EPIDURAL

## 2022-08-05 NOTE — Discharge Instructions (Signed)

## 2022-08-13 ENCOUNTER — Telehealth: Payer: Self-pay | Admitting: Family Medicine

## 2022-08-13 NOTE — Telephone Encounter (Signed)
I plan that back injection on an MRI from 2021.  It may be worthwhile updating the MRI before we do any more back injections.  I can get a new back injection so we have a more clear idea of where to go next.  Would you like me to do that?

## 2022-08-13 NOTE — Telephone Encounter (Signed)
Pt husband called, pt had epidural on 11/20. No improvement and may be feeling worse. Did not want to arbitrarily schedule an appt, as pt does not have transportation and is in pain.  Please call with any advice.

## 2022-08-13 NOTE — Telephone Encounter (Signed)
Spoke with pt, unsure about doing another MRI and had some concerns about the most recent injection being different than the last. Scheduled appt with Dr. Georgina Snell for 08/14/2022.

## 2022-08-13 NOTE — Telephone Encounter (Signed)
Left VM for pt to call back for response.

## 2022-08-14 ENCOUNTER — Ambulatory Visit: Payer: Medicare Other | Admitting: Family Medicine

## 2022-08-14 ENCOUNTER — Ambulatory Visit: Payer: Self-pay

## 2022-08-14 VITALS — BP 122/60 | HR 81 | Ht 63.5 in

## 2022-08-14 DIAGNOSIS — G8929 Other chronic pain: Secondary | ICD-10-CM

## 2022-08-14 DIAGNOSIS — M545 Low back pain, unspecified: Secondary | ICD-10-CM | POA: Diagnosis not present

## 2022-08-14 DIAGNOSIS — R269 Unspecified abnormalities of gait and mobility: Secondary | ICD-10-CM

## 2022-08-14 MED ORDER — AMBULATORY NON FORMULARY MEDICATION: 0 refills | Status: DC

## 2022-08-14 NOTE — Patient Instructions (Signed)
Good to see you Injection given today  Call or go to the ER if you develop a large red swollen joint with extreme pain or oozing puss.   Come back in 1 week for the second injection in hip

## 2022-08-14 NOTE — Progress Notes (Unsigned)
[name], am serving as a Education administrator for Dr. Hulan Saas.  Briana Fitzgerald is a 88 y.o. female who presents to [place] at [facility] today for follow up after the epidural. Patient states that she is not feeling any better and is wondering if she needs a new MRI. Patient states that she is doing worse after the epidural, she is in more pain and having a lot of trouble walking and getting up from a chair. Patient states that this pain is a lot different then the pain she was having before. Patient is now having pain in the left hip.  Pain is located at the posterior and lateral left hip area.  She points to the left SI joint and left greater trochanter as the area of pain.  Pain is worse with standing from a seated position and walking.  No radiating pain weakness or numbness distally currently.  Dx imaging: 04/22/22 L-spine XR             04/02/20 L-spine MRI             02/15/20 L-spine XR  Pertinent review of systems: No fevers or chills  Relevant historical information: GERD   Exam:  BP 122/60   Pulse 81   Ht 5' 3.5" (1.613 m)   SpO2 97%   BMI 21.10 kg/m  General: Well Developed, well nourished, and in no acute distress.   MSK: L-spine: Normal appearing Nontender midline.  Tender palpation left SI joint. Decreased lumbar motion.  Left hip: Normal. Tender palpation greater trochanter.  Decreased hip abduction strength.    Lab and Radiology Results  Procedure: Real-time Ultrasound Guided Injection of left SI joint Device: Philips Affiniti 50G Images permanently stored and available for review in PACS Verbal informed consent obtained.  Discussed risks and benefits of procedure. Warned about infection, bleeding, hyperglycemia damage to structures among others. Patient expresses understanding and agreement Time-out conducted.   Noted no overlying erythema, induration, or other signs of local infection.   Skin prepped in a sterile fashion.    Local anesthesia: Topical Ethyl chloride.   With sterile technique and under real time ultrasound guidance: 40 mg of Kenalog and 2 mL of Marcaine injected into SI joint. Fluid seen entering the joint capsule.   Completed without difficulty   Pain immediately resolved suggesting accurate placement of the medication.   Advised to call if fevers/chills, erythema, induration, drainage, or persistent bleeding.   Images permanently stored and available for review in the ultrasound unit.  Impression: Technically successful ultrasound guided injection.        Assessment and Plan: 88 y.o. female with left low back and lateral hip pain.  The source of the pain is challenging to evaluate.  I think the majority the pain in today's exam is due to SI joint dysfunction and left lateral hip pain.  Will proceed with an SI joint injection and return next week and proceed with a greater trochanter injection if needed.  We could try facet injections in the future as well.  She had a pretty successful epidural steroid injection recently as she no longer has radicular pain. Recommend also use of a walker.  A prescription/order for a walker was provided today.   PDMP not reviewed this encounter. Orders Placed This Encounter  Procedures   [place] LIMITED JOINT SPACE STRUCTURES LOW LEFT(NO LINKED CHARGES)    Order Specific Question:   Reason for Exam (SYMPTOM  OR DIAGNOSIS REQUIRED)  Answer:   SI joint inje    Order Specific Question:   Preferred imaging location?    Answer:   [place]   Meds ordered this encounter  Medications   AMBULATORY NON FORMULARY MEDICATION    Sig: Rolling walker disp 1    Dispense:  1 each    Refill:  0     Discussed warning signs or symptoms. Please see discharge instructions. Patient expresses understanding.   The above documentation has been reviewed and is accurate and complete Lynne Leader, M.D.

## 2022-08-20 ENCOUNTER — Ambulatory Visit: Payer: Self-pay

## 2022-08-20 ENCOUNTER — Ambulatory Visit: Payer: Medicare Other | Admitting: Family Medicine

## 2022-08-20 VITALS — BP 158/88 | HR 78 | Ht 63.5 in

## 2022-08-20 DIAGNOSIS — M7062 Trochanteric bursitis, left hip: Secondary | ICD-10-CM

## 2022-08-20 DIAGNOSIS — M7061 Trochanteric bursitis, right hip: Secondary | ICD-10-CM

## 2022-08-20 NOTE — Patient Instructions (Addendum)
Thank you for coming in today.   You received an injection today. Seek immediate medical attention if the joint becomes red, extremely painful, or is oozing fluid.   If not better we will get a new MRI.

## 2022-08-20 NOTE — Progress Notes (Unsigned)
I, Peterson Lombard, LAT, ATC acting as a scribe for Lynne Leader, MD.  Briana Fitzgerald is a 88 y.o. female who presents to [place] at [facility] today for f/u LBP and L hip pain. Pt was last seen by Dr. Georgina Snell on 08/14/22 and was given a L SI joint steroid injection and was provided a rx for a walker and was advised to return in the future prn for a GT steroid injection. Pt most recent lumbosacral ESI was on 08/05/22. Today, pt reports the lateral aspect of her L hip is painful. Pt notes no relief from prior SI joint steroid injection.   Dx imaging: 04/22/22 L-spine XR             04/02/20 L-spine MRI             02/15/20 L-spine XR  Pertinent review of systems: No fevers or chills  Relevant historical information: Scoliosis   Exam:  BP (!) 158/88   Pulse 78   Ht 5' 3.5" (1.613 m)   SpO2 97%   BMI 21.10 kg/m  General: Well Developed, well nourished, and in no acute distress.   MSK: Left hip: Normal-appearing Tender palpation greater trochanter decreased hip range of motion.  L-spine: Decreased lumbar motion.  Pain with extension.  Patient is walking using a rolling walker now.    Lab and Radiology Results  Hip greater trochanteric injection: left Consent obtained and timeout performed. Area of maximum tenderness palpated and identified. Skin cleaned with alcohol, cold spray applied. A 22g needle was used to access the greater trochanteric bursa. '40mg'$  of Kenalog and 2 mL of Marcaine were used to inject the trochanteric bursa. Patient tolerated the procedure well.   EXAM: LUMBAR SPINE - COMPLETE 4+ VIEW   COMPARISON:  Lumbar spine MRI April 02, 2020   FINDINGS: Persistent mild height loss of the T12 and L4 vertebral bodies. T12-L1 disc space loss and possible fusion. L4-5 disc space loss and possible fusion. Grade 1 anterolisthesis of L3 on L4. Multilevel lower lumbar spine facet degenerative changes. SI joints unremarkable. Surgical clips. Vascular  calcs.   IMPRESSION: Grade 1 anterolisthesis of L3 on L4.   Extensive multilevel degenerative disc and facet disease.   Persistent height loss of the T12 vertebral body.     Electronically Signed   By: Lovey Newcomer M.D.   On: 04/23/2022 10:52 I, Lynne Leader, personally (independently) visualized and performed the interpretation of the images attached in this note.      Assessment and Plan: 88 y.o. female with Left lateral hip pain in the setting of chronic low back pain. Plan for greater trochanter injection today.  If her overall low back and hip pain does not improve after today's injection would recommend a repeat lumbar spine MRI.  This would be for further injection planning in around the spine.  Her last MRI was in 2021 and is now out of date.  She will let me know. The SI joint injection that I performed last week did not help at all so I think the key to her back pain may be facet joint injections or something similar.  PDMP not reviewed this encounter. Orders Placed This Encounter  Procedures   [place] LIMITED JOINT SPACE STRUCTURES LOW LEFT(NO LINKED CHARGES)    Order Specific Question:   Reason for Exam (SYMPTOM  OR DIAGNOSIS REQUIRED)    Answer:   left hip pain    Order Specific Question:   Preferred imaging  location?    Answer:   [place]   No orders of the defined types were placed in this encounter.    Discussed warning signs or symptoms. Please see discharge instructions. Patient expresses understanding.   The above documentation has been reviewed and is accurate and complete Lynne Leader, M.D.

## 2022-08-22 ENCOUNTER — Telehealth: Payer: Self-pay | Admitting: Family Medicine

## 2022-08-22 NOTE — Telephone Encounter (Signed)
Patient's husband called stating that the injection that was done on Tuesday has greatly helped her left side. The pain in practically all gone. Now, she is having very bad pain on the right side. Similar to the previous pain on the left but farther down on the right.  Please advise.  Call back: [phone]

## 2022-08-23 ENCOUNTER — Observation Stay (HOSPITAL_COMMUNITY)
Admission: EM | Admit: 2022-08-23 | Discharge: 2022-08-23 | Disposition: A | Discharge status: Home Health | Payer: Medicare Other | Attending: Emergency Medicine | Admitting: Emergency Medicine

## 2022-08-23 ENCOUNTER — Emergency Department (HOSPITAL_COMMUNITY): Payer: Medicare Other

## 2022-08-23 ENCOUNTER — Telehealth: Payer: Self-pay | Admitting: Family Medicine

## 2022-08-23 DIAGNOSIS — S3210XA Unspecified fracture of sacrum, initial encounter for closed fracture: Secondary | ICD-10-CM | POA: Diagnosis not present

## 2022-08-23 DIAGNOSIS — M2550 Pain in unspecified joint: Secondary | ICD-10-CM | POA: Diagnosis not present

## 2022-08-23 DIAGNOSIS — S3219XA Other fracture of sacrum, initial encounter for closed fracture: Secondary | ICD-10-CM | POA: Diagnosis not present

## 2022-08-23 DIAGNOSIS — M79604 Pain in right leg: Secondary | ICD-10-CM | POA: Diagnosis not present

## 2022-08-23 DIAGNOSIS — W19XXXA Unspecified fall, initial encounter: Secondary | ICD-10-CM | POA: Insufficient documentation

## 2022-08-23 DIAGNOSIS — R609 Edema, unspecified: Secondary | ICD-10-CM | POA: Diagnosis not present

## 2022-08-23 DIAGNOSIS — R6889 Other general symptoms and signs: Secondary | ICD-10-CM | POA: Diagnosis not present

## 2022-08-23 DIAGNOSIS — M48061 Spinal stenosis, lumbar region without neurogenic claudication: Secondary | ICD-10-CM | POA: Diagnosis not present

## 2022-08-23 DIAGNOSIS — Z743 Need for continuous supervision: Secondary | ICD-10-CM | POA: Diagnosis not present

## 2022-08-23 DIAGNOSIS — M545 Low back pain, unspecified: Secondary | ICD-10-CM | POA: Diagnosis not present

## 2022-08-23 DIAGNOSIS — M1612 Unilateral primary osteoarthritis, left hip: Secondary | ICD-10-CM | POA: Diagnosis not present

## 2022-08-23 DIAGNOSIS — Z79899 Other long term (current) drug therapy: Secondary | ICD-10-CM | POA: Diagnosis not present

## 2022-08-23 DIAGNOSIS — M79605 Pain in left leg: Secondary | ICD-10-CM | POA: Diagnosis not present

## 2022-08-23 DIAGNOSIS — M4126 Other idiopathic scoliosis, lumbar region: Secondary | ICD-10-CM | POA: Diagnosis not present

## 2022-08-23 HISTORY — DX: Unspecified fracture of sacrum, initial encounter for closed fracture: S32.10XA

## 2022-08-23 LAB — BASIC METABOLIC PANEL
Anion gap: 7 (ref 5–15)
BUN: 12 mg/dL (ref 8–23)
CO2: 26 mmol/L (ref 22–32)
Calcium: 9.7 mg/dL (ref 8.9–10.3)
Chloride: 96 mmol/L — ABNORMAL LOW (ref 98–111)
Creatinine, Ser: 0.66 mg/dL (ref 0.44–1.00)
GFR, Estimated: >60 mL/min (ref >60)
Glucose, Bld: 124 mg/dL — ABNORMAL HIGH (ref 70–99)
Potassium: 4.1 mmol/L (ref 3.5–5.1)
Sodium: 129 mmol/L — ABNORMAL LOW (ref 135–145)

## 2022-08-23 LAB — CBC WITH DIFFERENTIAL/PLATELET
Abs Immature Granulocytes: 0.08 10*3/uL — ABNORMAL HIGH (ref 0.00–0.07)
Basophils Absolute: 0 10*3/uL (ref 0.0–0.1)
Basophils Relative: 0 %
Eosinophils Absolute: 0.2 10*3/uL (ref 0.0–0.5)
Eosinophils Relative: 2 %
HCT: 36.8 % (ref 36.0–46.0)
Hemoglobin: 12.8 g/dL (ref 12.0–15.0)
Immature Granulocytes: 1 %
Lymphocytes Relative: 11 %
Lymphs Abs: 0.8 10*3/uL (ref 0.7–4.0)
MCH: 29.5 pg (ref 26.0–34.0)
MCHC: 34.8 g/dL (ref 30.0–36.0)
MCV: 84.8 fL (ref 80.0–100.0)
Monocytes Absolute: 0.6 10*3/uL (ref 0.1–1.0)
Monocytes Relative: 8 %
Neutro Abs: 5.6 10*3/uL (ref 1.7–7.7)
Neutrophils Relative %: 78 %
Platelets: 172 10*3/uL (ref 150–400)
RBC: 4.34 MIL/uL (ref 3.87–5.11)
RDW: 14.1 % (ref 11.5–15.5)
WBC: 7.3 10*3/uL (ref 4.0–10.5)
nRBC: 0 % (ref 0.0–0.2)

## 2022-08-23 MED ORDER — OXYCODONE HCL 5 MG PO TABS: 5.0000 mg | ORAL_TABLET | Freq: Four times a day (QID) | ORAL | 0 refills | Status: DC | PRN

## 2022-08-23 MED ORDER — HYDROMORPHONE HCL 1 MG/ML IJ SOLN: 0.5000 mg | INTRAMUSCULAR | Status: DC | PRN

## 2022-08-23 MED ORDER — MORPHINE SULFATE (PF) 2 MG/ML IV SOLN
2.0000 mg | Freq: Once | INTRAVENOUS | Status: AC
  Administered 2022-08-23: 2 mg via INTRAVENOUS
  Filled 2022-08-23: qty 1

## 2022-08-23 MED ORDER — OXYCODONE HCL 5 MG PO TABS: 5.0000 mg | ORAL_TABLET | ORAL | Status: DC | PRN

## 2022-08-23 MED ORDER — ONDANSETRON HCL 4 MG/2ML IJ SOLN
4.0000 mg | Freq: Once | INTRAMUSCULAR | Status: AC
  Administered 2022-08-23: 4 mg via INTRAVENOUS
  Filled 2022-08-23: qty 2

## 2022-08-23 MED ORDER — ACETAMINOPHEN 325 MG PO TABS: 650.0000 mg | ORAL_TABLET | Freq: Four times a day (QID) | ORAL | Status: DC | PRN

## 2022-08-23 NOTE — Progress Notes (Signed)
PT Cancellation Note  Patient Details Name: Briana Fitzgerald MRN: [id number] DOB: 1932/02/18   Cancelled Treatment:    Reason Eval/Treat Not Completed: Other (comment)  PT orders received for imminent d/c but pt crying in hall in pain. She has not had pain meds recently and RN reports no orders for meds.  Started to contact MD and further nursing staff reports some medication orders recently entered.  Will f/u after pt has pain meds. Abran Richard, PT Acute Rehab [facility] [phone]  Karlton Lemon 08/23/2022, 10:49 AM

## 2022-08-23 NOTE — Discharge Instructions (Addendum)
Recommend 1000 g of Tylenol every 6 hours as needed for pain.  Recommend buying over-the-counter lidocaine patches for pain.  I have written you for narcotic pain medicine for breakthrough pain called Roxicodone.  This medication is sedating so please be careful with its use.  Please use walker at all times.  Follow-up with your sports medicine doctor.

## 2022-08-23 NOTE — ED Notes (Signed)
Pt voicing frustration regarding staying in the hallway. Pt has been asking everyone that passes by for food and pain meds. Pain meds were administered as soon as able d/t high acuity assignment. A message was sent to the provider regarding pain meds and food. Pt wants to go home. Notified provider.

## 2022-08-23 NOTE — Evaluation (Signed)
Physical Therapy Evaluation Patient Details Name: Briana Fitzgerald MRN: [id number] DOB: 07-08-1932 Today's Date: 08/23/2022  History of Present Illness  Pt is 88 yo female admitted 08/23/22 after fall several days ago with increased pain and difficulty ambulating.  Pt found to have acute to subacute bil sacral ala fractures.  Ortho recommended non-op and WBAT.  Pt with hx of low back pain, RLS, SI dysfunction, TIAs  Clinical Impression  Pt admitted with above diagnosis. At baseline, pt is independent with use of rollator.  She has been limited by pain since fall.  Pt had IV morphine ~2 hr prior to this therapy session and was able to tolerate multiple transfers, ambulated 40'x2, and performed a step all with cues and min guard to min A level.  Spouse present and report pt has strong and knowledgeable assist at home.  Pt demonstrated good pain control with IV medication and she reports she wants to go home and feels she can manage with oral meds.  Demonstrates mobility necessary to return home with support from PT perspective, but will benefit from PT while admitted to advance.  Pt currently with functional limitations due to the deficits listed below (see PT Problem List). Pt will benefit from skilled PT to increase their independence and safety with mobility to allow discharge to the venue listed below.          Recommendations for follow up therapy are one component of a multi-disciplinary discharge planning process, led by the attending physician.  Recommendations may be updated based on patient status, additional functional criteria and insurance authorization.  Follow Up Recommendations Home health PT      Assistance Recommended at Discharge Intermittent Supervision/Assistance  Patient can return home with the following  A little help with walking and/or transfers;A little help with bathing/dressing/bathroom;Assistance with cooking/housework;Help with stairs or ramp for entrance     Equipment Recommendations Rolling walker (2 wheels)  Recommendations for Other Services       Functional Status Assessment Patient has had a recent decline in their functional status and demonstrates the ability to make significant improvements in function in a reasonable and predictable amount of time.     Precautions / Restrictions Precautions Precautions: Fall Restrictions Weight Bearing Restrictions: Yes RLE Weight Bearing: Weight bearing as tolerated LLE Weight Bearing: Weight bearing as tolerated      Mobility  Bed Mobility Overal bed mobility: Needs Assistance Bed Mobility: Supine to Sit, Sit to Supine     Supine to sit: HOB elevated, Supervision Sit to supine: HOB elevated, Min assist   General bed mobility comments: Light min A for legs back to bed    Transfers Overall transfer level: Needs assistance Equipment used: Rolling walker (2 wheels) Transfers: Sit to/from Stand Sit to Stand: Min guard, Min assist           General transfer comment: Performed from elevated stretcher x 2 with min guard and low toilet x 1 with min A    Ambulation/Gait Ambulation/Gait assistance: Min guard Gait Distance (Feet): 40 Feet (40'x2) Assistive device: Rolling walker (2 wheels) Gait Pattern/deviations: Step-to pattern, Decreased stride length Gait velocity: decreased     General Gait Details: Cues for RW proximity and use; recommend RW over rollator due to improved use of UE for WBAT  Stairs Stairs: Yes Stairs assistance: Min assist Stair Management: Forwards Number of Stairs: 1 General stair comments: In ED so simulated using CPR stool.  Pt holding handle with both hands and stepped up and down  with min A.  R LE more painful than L so educated on sequengin up with good down with bad  Wheelchair Mobility    Modified Rankin (Stroke Patients Only)       Balance Overall balance assessment: Needs assistance Sitting-balance support: No upper extremity  supported Sitting balance-Leahy Scale: Good     Standing balance support: Bilateral upper extremity supported Standing balance-Leahy Scale: Poor Standing balance comment: REquired RW; stable with RW                             Pertinent Vitals/Pain Pain Assessment Pain Assessment: 0-10 Pain Score: 3  Pain Location: Sacrum Pain Descriptors / Indicators: Discomfort Pain Intervention(s): Limited activity within patient's tolerance, Monitored during session, Premedicated before session (Received IV morphine ~ 2 hr prior)    Home Living Family/patient expects to be discharged to:: Private residence Living Arrangements: Spouse/significant other;Children (dtr-in-law who is Therapist, sports) Available Help at Discharge: Family;Available 24 hours/day Type of Home: House Home Access: Stairs to enter Entrance Stairs-Rails: Right;Left;Can reach both Entrance Stairs-Number of Steps: 3   Home Layout: One level Home Equipment: Rollator (4 wheels);Shower seat;BSC/3in1;Grab bars - toilet;Grab bars - tub/shower Additional Comments: adjustable bed; report 2 strong family members available to help with stairs    Prior Function Prior Level of Function : Independent/Modified Independent             Mobility Comments: Could ambulate in community with rollator ADLs Comments: Independent with ADLs and light IADLs     Hand Dominance        Extremity/Trunk Assessment   Upper Extremity Assessment Upper Extremity Assessment: Overall WFL for tasks assessed    Lower Extremity Assessment Lower Extremity Assessment: RLE deficits/detail;LLE deficits/detail RLE Deficits / Details: ROM WFL; MMT: light MMT for pain control demonstrating 5/5 ankle and knee, 2/5 hip not further tested LLE Deficits / Details: ROM WFL; MMT: light MMT for pain control demonstrating 5/5 ankle and knee, 2/5 hip not further tested    Cervical / Trunk Assessment Cervical / Trunk Assessment: Normal  Communication    Communication: HOH  Cognition Arousal/Alertness: Awake/alert Behavior During Therapy: WFL for tasks assessed/performed Overall Cognitive Status: Within Functional Limits for tasks assessed                                 General Comments: Eager to return home; spouse present        General Comments General comments (skin integrity, edema, etc.): Pt and spouse eager to return home and report good support. Report pain much improved with IV meds, discussed would not have IV meds for home but they feel they can manage with oral meds    Exercises     Assessment/Plan    PT Assessment Patient needs continued PT services  PT Problem List Decreased strength;Pain;Decreased range of motion;Decreased activity tolerance;Decreased knowledge of use of DME;Decreased mobility;Decreased knowledge of precautions;Decreased safety awareness;Decreased balance       PT Treatment Interventions DME instruction;Therapeutic exercise;Gait training;Balance training;Stair training;Functional mobility training;Therapeutic activities;Patient/family education;Modalities    PT Goals (Current goals can be found in the Care Plan section)  Acute Rehab PT Goals Patient Stated Goal: return home asap PT Goal Formulation: With patient/family Time For Goal Achievement: 09/06/22 Potential to Achieve Goals: Good    Frequency Min 4X/week     Co-evaluation  AM-PAC PT "6 Clicks" Mobility  Outcome Measure Help needed turning from your back to your side while in a flat bed without using bedrails?: A Little Help needed moving from lying on your back to sitting on the side of a flat bed without using bedrails?: A Little Help needed moving to and from a bed to a chair (including a wheelchair)?: A Little Help needed standing up from a chair using your arms (e.g., wheelchair or bedside chair)?: A Little Help needed to walk in hospital room?: A Little Help needed climbing 3-5 steps with a  railing? : A Little 6 Click Score: 18    End of Session Equipment Utilized During Treatment: Gait belt (report having gait belt) Activity Tolerance: Patient tolerated treatment well Patient left: in bed;with family/visitor present (In ED hallway) Nurse Communication: Mobility status PT Visit Diagnosis: Other abnormalities of gait and mobility (R26.89);Pain Pain - part of body:  (pelvis)    Time: 0511-0211 PT Time Calculation (min) (ACUTE ONLY): 33 min   Charges:   PT Evaluation $PT Eval Low Complexity: 1 Low PT Treatments $Gait Training: 8-22 mins        Abran Richard, PT Acute Rehab [facility] [phone]   Karlton Lemon 08/23/2022, 2:24 PM

## 2022-08-23 NOTE — ED Notes (Signed)
Patient transported to X-ray 

## 2022-08-23 NOTE — Telephone Encounter (Signed)
Next step would probably be an MRI of the lumbar spine.  Would you like me to order that?

## 2022-08-23 NOTE — Consult Note (Cosign Needed Addendum)
Reason for Consult:Pelvic fxs Referring Physician: Lennice Fitzgerald Time called: [id number] Time at bedside: Briana Fitzgerald is an 87 y.o. female.  HPI: Briana Fitzgerald comes to the ED with a long-standing LBP that radiates into her legs. She's been under the care of Briana Fitzgerald who has done some steroid injections that have helped only a little. MRI was done last night that showed some sacral and sacral ala fxs and orthopedic surgery was consulted. She lives at home with her husband and generally ambulates with a cane or a RW. She falls often.  Past Medical History:  Diagnosis Date   Arthritis    BACK PAIN 07/18/2008   Bowel obstruction ([place])    CONSTIPATION 10/02/2010   GERD 10/02/2010   Headache    IBS (irritable bowel syndrome)    LAMINECTOMY, LUMBAR, HX OF 06/16/2008   OSTEOARTHRITIS 10/02/2010   Pneumonia    RESTLESS LEG SYNDROME 10/02/2010   SACROILIAC JOINT DYSFUNCTION 06/16/2008   Sleep apnea    STYE 10/02/2010   TRANSIENT ISCHEMIC ATTACKS, HX OF 10/02/2010    Past Surgical History:  Procedure Laterality Date   ABDOMINAL HYSTERECTOMY  1979   TAH, fibroids   APOGEE / PERIGEE REPAIR  2005   [place]   EYE SURGERY  2005   [place],both eyes   LIVER BIOPSY  1997   benign, hemangioma resection   SPINE SURGERY  2006   laminectomy    Family History  Problem Relation Age of Onset   Stroke Father    Diabetes Father        type ll   Pancreatitis Brother    Breast cancer Paternal Aunt        aunt   Colon cancer Neg Hx    Stomach cancer Neg Hx    Rectal cancer Neg Hx    Liver cancer Neg Hx    Esophageal cancer Neg Hx     Social History:  reports that she has never smoked. She has never used smokeless tobacco. She reports current alcohol use. She reports that she does not use drugs.  Allergies: No Known Allergies  Medications: I have reviewed the patient's current medications.  Results for orders placed or performed during the hospital encounter of 08/23/22 (from  the past 48 hour(s))  CBC with Differential/Platelet     Status: Abnormal   Collection Time: 08/23/22  4:22 AM  Result Value Ref Range   WBC 7.3 4.0 - 10.5 K/uL   RBC 4.34 3.87 - 5.11 MIL/uL   Hemoglobin 12.8 12.0 - 15.0 g/dL   HCT 36.8 36.0 - 46.0 %   MCV 84.8 80.0 - 100.0 fL   MCH 29.5 26.0 - 34.0 pg   MCHC 34.8 30.0 - 36.0 g/dL   RDW 14.1 11.5 - 15.5 %   Platelets 172 150 - 400 K/uL   nRBC 0.0 0.0 - 0.2 %   Neutrophils Relative % 78 %   Neutro Abs 5.6 1.7 - 7.7 K/uL   Lymphocytes Relative 11 %   Lymphs Abs 0.8 0.7 - 4.0 K/uL   Monocytes Relative 8 %   Monocytes Absolute 0.6 0.1 - 1.0 K/uL   Eosinophils Relative 2 %   Eosinophils Absolute 0.2 0.0 - 0.5 K/uL   Basophils Relative 0 %   Basophils Absolute 0.0 0.0 - 0.1 K/uL   Immature Granulocytes 1 %   Abs Immature Granulocytes 0.08 (H) 0.00 - 0.07 K/uL    Comment: Performed at [facility], [address] [place], [place] [address]  Basic metabolic panel     Status: Abnormal   Collection Time: 08/23/22  4:22 AM  Result Value Ref Range   Sodium 129 (L) 135 - 145 mmol/L   Potassium 4.1 3.5 - 5.1 mmol/L   Chloride 96 (L) 98 - 111 mmol/L   CO2 26 22 - 32 mmol/L   Glucose, Bld 124 (H) 70 - 99 mg/dL    Comment: Glucose reference range applies only to samples taken after fasting for at least 8 hours.   BUN 12 8 - 23 mg/dL   Creatinine, Ser 0.66 0.44 - 1.00 mg/dL   Calcium 9.7 8.9 - 10.3 mg/dL   GFR, Estimated >60 >60 mL/min    Comment: (NOTE) Calculated using the CKD-EPI Creatinine Equation (2021)    Anion gap 7 5 - 15    Comment: Performed at [facility] [address], [place], [place] [address]    MR HIP LEFT WO CONTRAST  Result Date: 08/23/2022 CLINICAL DATA:  Chronic bilateral hip pain, worsening today. EXAM: MR OF THE LEFT HIP WITHOUT CONTRAST TECHNIQUE: Multiplanar, multisequence MR imaging was performed. No intravenous contrast was administered. COMPARISON:  Radiographs 08/23/2022 and 03/12/2018.  Pelvic CT 07/26/2016. FINDINGS: Bones: In correlation with the radiographs, the bones are demineralized. There are bilateral sacral insufficiency fractures with associated bone marrow edema, greater on the right. No other fractures are identified. Specifically, there is no evidence of proximal femur fracture, dislocation or osteonecrosis. Probable incidental 1.7 cm hemangioma within the left iliac wing. Hemangioma also noted within the L5 vertebral body. Lumbar spine findings are dictated separately. Articular cartilage and labrum Articular cartilage: Mild degenerative changes of both hips without focal chondral defect or subchondral signal abnormality in either femoral head. Labrum: There is no gross labral tear or paralabral abnormality. Joint or bursal effusion Joint effusion: No significant hip joint effusion. Bursae: No focal periarticular fluid collection. Muscles and tendons Muscles and tendons: The visualized gluteus, hamstring and iliopsoas tendons appear normal. The piriformis muscles appear symmetric. Mild gluteus muscular atrophy bilaterally. Other findings Miscellaneous: Prominent diverticular changes throughout the sigmoid colon. There are mildly prominent pelvic sidewall lymph nodes bilaterally, likely reactive. IMPRESSION: 1. Bilateral sacral insufficiency fractures with associated bone marrow edema, greater on the right. 2. No evidence of proximal femur fracture, dislocation or osteonecrosis. 3. Mild degenerative changes of both hips. 4. Lumbar spine findings dictated separately. Electronically Signed   By: Briana Fitzgerald M.D.   On: 08/23/2022 08:33   MR LUMBAR SPINE WO CONTRAST  Result Date: 08/23/2022 CLINICAL DATA:  88 year old female with low back pain. EXAM: MRI LUMBAR SPINE WITHOUT CONTRAST TECHNIQUE: Multiplanar, multisequence MR imaging of the lumbar spine was performed. No intravenous contrast was administered. COMPARISON:  Lumbar radiographs 04/22/2022.  MRI 04/02/2020. FINDINGS:  Segmentation: Normal on the comparison radiographs. This is the same numbering system used on the 2021 MRI. Alignment: Stable since 2021. Chronic retrolisthesis of T12 on L1, anterolisthesis of L3 on L4 (6-7 mm) and mild underlying levoconvex lumbar scoliosis. Vertebrae: Chronic disc space loss and ankylosis at T12-L1, and similar appearance at L4-L5. Scattered benign vertebral hemangiomas. No marrow edema or evidence of acute osseous abnormality. In the visible lower thoracic or lumbar levels. However, there is confluent marrow edema throughout the visible bilateral sacral ala (series 9, image 35) and tracking into the central sacrum where comminuted but minimally displaced fractures of the central S2 vertebra are visible on series 5, image 7 and series 6, image  7. Conus medullaris and cauda equina: Conus extends to the L1 level. No lower spinal cord or conus signal abnormality. Paraspinal and other soft tissues: Stable since 2021. Chronic postoperative changes to the posterior lower lumbar paraspinal soft tissues. Visible abdominal viscera appear stable and negative for age. Disc levels: Unchanged compared to the 2021 MRI. Multifactorial mild spinal stenosis just above the conus at T12-L1 (chronic ankylosis there). Moderate multifactorial spinal stenosis at L2-L3. And previous posterior decompression from L3 through L5. IMPRESSION: 1. Acute to subacute bilateral sacral ala and central S2 sacral fractures. These could be posttraumatic and/or insufficiency related. 2. Stable lower thoracic and lumbar spine since 2021 MRI. Chronic ankylosis of T12-L1 and L4-L5. Chronic degenerative and postoperative changes with residual spinal stenosis most pronounced at L2-L3. Electronically Signed   By: Genevie Ann M.D.   On: 08/23/2022 08:12   DG Pelvis 1-2 Views  Result Date: 08/23/2022 CLINICAL DATA:  Recent fall.  Pain. EXAM: PELVIS - 1-2 VIEW COMPARISON:  04/04/2011 FINDINGS: Bones are markedly demineralized. Sacrum is poorly  visualized due to osteopenia and overlying bowel gas. Within this limitation, no acute of the bony pelvis fracture evident. SI joints and symphysis pubis unremarkable. Appearance of superior endplate compression at L4 is stable since lumbar spine study of 04/22/2022. No worrisome lytic or sclerotic osseous abnormality. IMPRESSION: 1. No acute bony abnormality. 2. Marked osteopenia. Electronically Signed   By: Misty Stanley M.D.   On: 08/23/2022 04:57    Review of Systems  HENT:  Negative for ear discharge, ear pain, hearing loss and tinnitus.   Eyes:  Negative for photophobia and pain.  Respiratory:  Negative for cough and shortness of breath.   Cardiovascular:  Negative for chest pain.  Gastrointestinal:  Negative for abdominal pain, nausea and vomiting.  Genitourinary:  Negative for dysuria, flank pain, frequency and urgency.  Musculoskeletal:  Positive for back pain. Negative for myalgias and neck pain.  Neurological:  Negative for dizziness and headaches.  Hematological:  Does not bruise/bleed easily.  Psychiatric/Behavioral:  The patient is not nervous/anxious.    Blood pressure (!) 125/109, pulse 78, temperature 98.8 F (37.1 C), resp. rate 17, SpO2 93 %. Physical Exam Constitutional:      General: She is not in acute distress.    Appearance: She is well-developed. She is not diaphoretic.  HENT:     Head: Normocephalic and atraumatic.  Eyes:     General: No scleral icterus.       Right eye: No discharge.        Left eye: No discharge.     Conjunctiva/sclera: Conjunctivae normal.  Cardiovascular:     Rate and Rhythm: Normal rate and regular rhythm.  Pulmonary:     Effort: Pulmonary effort is normal. No respiratory distress.  Musculoskeletal:     Cervical back: Normal range of motion.     Comments: LLE No traumatic wounds, ecchymosis, or rash  Nontender, able to SLR with discomfort  No knee or ankle effusion    Skin:    General: Skin is warm and dry.  Neurological:      Mental Status: She is alert.  Psychiatric:        Mood and Affect: Mood normal.        Behavior: Behavior normal.     Assessment/Plan: Pelvic fxs -- No indication for operative intervention. Agree with plan to admit for pain control and mobilization. Should f/u with Dr. Georgina Snell upon discharge. WBAT BLE.    Lisette Abu, PA-C  Orthopedic Surgery [phone] 08/23/2022, 9:16 AM

## 2022-08-23 NOTE — TOC Transition Note (Signed)
Transition of Care [facility]) - CM/SW Discharge Note   Patient Details  Name: Briana Fitzgerald MRN: [id number] Date of Birth: March 23, 1932  [facility] CM/SW Contact:  Verdell Carmine, RN Phone Number: 08/23/2022, 3:07 PM   Clinical Narrative:     Patient presented with leg pain, little relieved it PT did finally get an assessment and HH was recommended. Accepted with [facility] for Ordered therapies.     Barriers to Discharge: No Barriers Identified   Patient Goals and CMS Choice        Discharge Placement  Home with home health                     Discharge Plan and Services                          HH Arranged: PT, OT, Nurse's Aide, RN, Social Work [facility] Agency: [place] Date [place]: 08/23/22 Time [place]: [place] Representative spoke with at [place]: [name]  Social Determinants of Health ([place]) Interventions     Readmission Risk Interventions     No data to display

## 2022-08-23 NOTE — ED Provider Notes (Addendum)
Patient signed out to me at 7 AM awaiting MRI of her low back and hip.  She has been having worsening low back pain, bilateral leg pain.  She had a fall couple days ago where she landed on her buttocks.  She has been having ongoing pain now requiring a walker that family brought her last week and she had a fall couple days ago and things have been worse.  Unable to really ambulate without pain.  Neurovascular neuromuscular intact otherwise.  MRI per radiology report shows bilateral sacral insufficiency fractures.  There is no hip fracture.  Talked with Hilbert Odor with orthopedics who will evaluate patient but overall we will admit to medicine for PT OT, placement, pain control.  She lives at home with her husband.  She is not safe to ambulate with her current situation.  Patient is hesitant about admission for workup and placement likely.  Ultimately hospitalist has evaluated the patient.  Will allow her to be assessed by physical therapy and have social work and case management weigh in to see if we can come up with a safe plan for her to go home but ultimately I think she benefit from admission and placement.  Ultimately patient worked with physical therapy.  They recommend activity as tolerated with rolling walker.  Patient preferred to go home.  She was discharged with prescription for oxycodone.  She can follow-up with her sports medicine Dr. Georgina Snell.  This chart was dictated using voice recognition software.  Despite best efforts to proofread,  errors can occur which can change the documentation meaning.    Lennice Sites, DO 08/23/22 [id number]    Lennice Sites, DO 08/23/22 [place], Ronan, DO 08/23/22 1449

## 2022-08-23 NOTE — ED Triage Notes (Signed)
Pt bib [facility] from home c/o chronic bilateral leg pain that worsened tonight. Has had cortisone injections in the past with improvement. Non pitting edema noted bilaterally, on lasix.   BP 152/86 HR 80 SPO2 95% RA RR 18

## 2022-08-23 NOTE — Telephone Encounter (Signed)
Left pt a VM informing her we could order an MRI or to have a f/u to look at her R side.

## 2022-08-23 NOTE — ED Notes (Signed)
Pt in MRI upon taking over care

## 2022-08-23 NOTE — ED Notes (Signed)
Patient transported to MRI 

## 2022-08-23 NOTE — Telephone Encounter (Signed)
Pt husband called, she has taken a fall and is hospitalized with fractures.  MRI done at hospital. Husband thinks we can cancel the MRI we ordered and wanted Dr. Georgina Snell to know what was going on with her.

## 2022-08-23 NOTE — Hospital Course (Signed)
Bib [facility] from home c/o chronic bilateral leg pain that worsened tonight. Has had cortisone injections in the past with improvement. Non pitting edema noted bilaterally, on lasix.    Patient signed out to me at 7 AM awaiting MRI of her low back and hip.  She has been having worsening low back pain, bilateral leg pain.  She had a fall couple days ago where she landed on her buttocks.  She has been having ongoing pain now requiring a walker that family brought her last week and she had a fall couple days ago and things have been worse. Unable to really ambulate without pain. Neurovascular neuromuscular intact otherwise.   MRI per radiology report shows bilateral sacral insufficiency fractures.  There is no hip fracture.  Talked with Hilbert Odor with orthopedics who will evaluate patient but overall we will admit to medicine for PT OT, placement, pain control.  She lives at home with her husband.  She is not safe to ambulate with her current situation.  Pelvic fxs -- No indication for operative intervention. Agree with plan to admit for pain control and mobilization. Should f/u with Dr. Amalia Hailey upon discharge. WBAT BLE.    Hyponatremia Na+ 129 CBC WNL

## 2022-08-23 NOTE — ED Notes (Signed)
Reviewed discharge instructions with patient and spouse. Follow-up care and medications reviewed. Patient and spouse verbalized understanding. Patient A&Ox4, VSS upon discharge. 

## 2022-08-23 NOTE — Telephone Encounter (Signed)
Called pt's husband and sent our condolences and hopes for a speedy recover. He expressed appreciation for the call.

## 2022-08-23 NOTE — Consult Note (Signed)
Initial Consultation Note   Patient: Briana Fitzgerald FWY:[id number] DOB: 27-Feb-1932 PCP: Eulas Post, MD DOA: 08/23/2022 DOS: the patient was seen and examined on 08/23/2022 Primary service: Samuella Cota, MD  Referring physician: Dr. Ronnald Nian Reason for consult: sacral pain  Impression/Recommendations Acute to subacute bilateral sacral ala and centralized to sacral fractures.  Superimposed on chronic low back pain followed by Dr. Georgina Snell. Likely acute on chronic Pain control, PT evaluation, OT evaluation, TOC consultation.  Patient would like to go home if possible and feels confident she can mobilize with a walker. If she does well with therapy and TOC can arrange home health and DME needs, I would recommend discharge home.  Will follow-up after assessments.  Restless leg syndrome Can continue outpatient therapy    HPI:  88 year old woman PMH including back surgery, low back pain, restless leg septic joint, sacroiliac joint dysfunction, presented to the emergency department after a fall several days ago with increasing back pain and difficulty ambulating.  MRI sacrum showed bilateral sacral insufficiency fractures, no hip fracture or proximal femur fracture.  Lumbar spine MRI showed acute to subacute bilateral sacral ala and central S2 sacral fractures.  Stable lower thoracic and lumbar spine.  Because of pain, TRH consulted for assessment and recommendations.  Patient has been under the care of sports medicine, she has had multiple steroid injections, she has had chronic sacral and low back pain for some time.  She is normally ambulatory with a walker.  She had a fall about 4 days ago with increased pain and difficulty ambulating yesterday so her husband advised her to call EMS today.  She was treated with pain medication in the emergency department.  She reports no sensory dysfunction and is able to move both her legs.  She feels like she could walk with a walker and would  like to go home if possible.  She was seen by PA with orthopedics Mr. Dellis Filbert, no indication for operative intervention, pain control and mobilization.  Follow-up with Dr. Bertram Millard at discharge.  Weight-bear as tolerated.  Review of Systems: Negative for fever, she just got new glasses recently, negative for sore throat, rash, chest pain, shortness of breath, dysuria, bleeding, nausea, vomiting.  Past Medical History:  Diagnosis Date   Arthritis    BACK PAIN 07/18/2008   Bowel obstruction ([place])    CONSTIPATION 10/02/2010   GERD 10/02/2010   Headache    IBS (irritable bowel syndrome)    LAMINECTOMY, LUMBAR, HX OF 06/16/2008   OSTEOARTHRITIS 10/02/2010   Pneumonia    RESTLESS LEG SYNDROME 10/02/2010   SACROILIAC JOINT DYSFUNCTION 06/16/2008   Sleep apnea    STYE 10/02/2010   TRANSIENT ISCHEMIC ATTACKS, HX OF 10/02/2010   Past Surgical History:  Procedure Laterality Date   ABDOMINAL HYSTERECTOMY  1979   TAH, fibroids   APOGEE / PERIGEE REPAIR  2005   [place]  2005   catarac,both eyes   LIVER BIOPSY  1997   benign, hemangioma resection   SPINE SURGERY  2006   laminectomy   Social History:  reports that she has never smoked. She has never used smokeless tobacco. She reports current alcohol use. She reports that she does not use drugs.  No Known Allergies  Family History  Problem Relation Age of Onset   Stroke Father    Diabetes Father        type ll   Pancreatitis Brother    Breast cancer Paternal Aunt  aunt   Colon cancer Neg Hx    Stomach cancer Neg Hx    Rectal cancer Neg Hx    Liver cancer Neg Hx    Esophageal cancer Neg Hx     Prior to Admission medications   Medication Sig Start Date End Date Taking? Authorizing Provider  acetaminophen (TYLENOL) 500 MG tablet Take 500 mg by mouth every 6 (six) hours as needed.   Yes [provider]  Calcium Carbonate-Vitamin D 600-400 MG-UNIT tablet Take 1 tablet by mouth daily.   Yes [provider]  diclofenac (VOLTAREN) 25 MG EC tablet Take 1-2 tablets (25-50 mg total) by mouth 2 (two) times daily as needed for moderate pain. 07/25/22  Yes Gregor Hams, MD  furosemide (LASIX) 20 MG tablet TAKE 1 TABLET BY MOUTH EVERY DAY 03/18/22  Yes [name], [name], MD  Misc Natural Products (TART CHERRY ADVANCED PO) Take 1,200 mg by mouth daily.   Yes [provider]  mometasone (ELOCON) 0.1 % lotion Apply once daily to affected areas of scalp as needed 02/16/21  Yes [name], [name], MD  Omega-3 Fatty Acids (SALMON OIL-1000 PO) Take 1 tablet by mouth daily.   Yes [provider]  pramipexole (MIRAPEX) 0.5 MG tablet Take 1 tablet (0.5 mg total) by mouth at bedtime as needed. Take one tablet by mouth daily as needed for restless legs. 10/01/21  Yes [name], [name], MD  pyridoxine (B-6) 100 MG tablet Take 100 mg by mouth daily.   Yes [provider]  Ubrogepant (UBRELVY) 50 MG TABS Take one tablet by mouth as needed for migraine headache.  May repeat dose times one after 2 hours as needed for ongoing headache. 05/21/20  Yes [name], [name], MD  vitamin B-12 (CYANOCOBALAMIN) 500 MCG tablet Take 500 mcg by mouth daily.   Yes [provider]  vitamin E 400 UNIT capsule Take 400 Units by mouth daily.   Yes [provider]  AMBULATORY NON FORMULARY MEDICATION Rolling walker disp 1 08/14/22   Gregor Hams, MD  COVID-19 mRNA Vac-TriS, Pfizer, (PFIZER-BIONT COVID-19 VAC-TRIS) SUSP injection Inject into the muscle. 04/23/21   Carlyle Basques, MD  ferrous sulfate 324 MG TBEC Take 324 mg by mouth 2 (two) times a week. Patient not taking: Reported on 08/23/2022    [provider]  Fluocinolone Acetonide 0.01 % SHAM Apply topically to scalp once daily as needed for scalp pruritis and leave on for 5 minutes and then rinse off Patient not taking: Reported on 08/23/2022 02/16/21   Eulas Post, MD  rosuvastatin (CRESTOR) 5 MG tablet Take 1 tablet (5  mg total) by mouth daily. Patient not taking: Reported on 08/23/2022 12/11/21   Jerline Pain, MD  vitamin C (ASCORBIC ACID) 500 MG tablet Take 500 mg by mouth 2 (two) times a week.  Patient not taking: Reported on 08/23/2022    [provider]    Physical Exam: Vitals:   08/23/22 0354 08/23/22 0354 08/23/22 0822  BP:  (!) 149/66 (!) 125/109  Pulse:  78 78  Resp:  17 17  Temp: 97.8 F (36.6 C)  98.8 F (37.1 C)  TempSrc: Oral    SpO2:  93% 93%   Physical Exam Vitals reviewed.  Constitutional:      General: She is not in acute distress.    Appearance: She is not ill-appearing or toxic-appearing.  HENT:     Head: Normocephalic and atraumatic.  Cardiovascular:  Rate and Rhythm: Normal rate and regular rhythm.     Heart sounds: No murmur heard. Pulmonary:     Effort: Pulmonary effort is normal. No respiratory distress.     Breath sounds: No wheezing, rhonchi or rales.  Musculoskeletal:     Right lower leg: No edema.     Left lower leg: No edema.     Comments: Moves both legs to command  Skin:    Findings: No erythema.  Neurological:     General: No focal deficit present.     Mental Status: She is alert.     Sensory: No sensory deficit.     Motor: No weakness.  Psychiatric:        Mood and Affect: Mood normal.        Behavior: Behavior normal.     Data Reviewed:  Sodium 129, chloride 96 suggesting dehydration CBC within normal limits Pelvis no acute abnormalities plain film MRI hip bilateral sacral insufficiency fractures.  No proximal femur fracture. MRI lumbar spine acute to subacute bilateral sacral ala and central S2 sacral fractures.  Family Communication:  Primary team communication: discussed with RN, Dr. Ronnald Nian Thank you very much for involving [place] in the care of your patient.  Author: Murray Hodgkins, MD 08/23/2022 12:30 PM  For on call review [email or web address].

## 2022-08-26 NOTE — Telephone Encounter (Signed)
Patient's husband called to let Dr Georgina Snell know that Briana Fitzgerald is home but is in a lot of pain especially when sitting and is still having continued pain from before. He didn't know what the next steps would be and also mentioned that PT was going to be ordered for her but he didn't know if that would come from [place] or the hospital?

## 2022-08-26 NOTE — Telephone Encounter (Signed)
Called and spoke to pt's husband. He had not yet heard from home health PT. I provided his [facility]'s phone number and schedule pt for a f/u phone visit on 12/13.

## 2022-08-26 NOTE — Telephone Encounter (Signed)
It looks like [place] home health accepted and should be contacting you. Have you heard from them? Please use the walker? Do you have enough pain medicines?  I would like to have a visit either a video visit or a office visit this week to make sure everything is going ok.   Briana Fitzgerald

## 2022-08-27 NOTE — Progress Notes (Signed)
   I, Peterson Lombard, LAT, ATC acting as a scribe for Lynne Leader, MD.  Briana Fitzgerald is a 88 y.o. female who presents to [place] at [facility] today for f/u after ED visit on 12/8, after suffering a fall. L hip MRI revealed bilat sacral fx. Pt was last seen by Dr. Georgina Snell on 08/20/22 and was given a L GT steroid injection. Today, pt reports  Dx imaging: 08/23/22 L hip & L-spine MRI and pelvis XR  04/22/22 L-spine XR  04/02/20 L-spine MRI  02/15/20 L-spine XR  Pertinent review of systems: ***  Relevant historical information: ***   Exam:  There were no vitals taken for this visit. General: Well Developed, well nourished, and in no acute distress.   MSK: ***    Lab and Radiology Results No results found for this or any previous visit (from the past 72 hour(s)). No results found.     Assessment and Plan: 88 y.o. female with ***   PDMP not reviewed this encounter. No orders of the defined types were placed in this encounter.  No orders of the defined types were placed in this encounter.    Discussed warning signs or symptoms. Please see discharge instructions. Patient expresses understanding.   ***

## 2022-08-28 ENCOUNTER — Encounter: Payer: Self-pay | Admitting: Family Medicine

## 2022-08-28 ENCOUNTER — Telehealth (INDEPENDENT_AMBULATORY_CARE_PROVIDER_SITE_OTHER): Payer: Medicare Other | Admitting: Family Medicine

## 2022-08-28 ENCOUNTER — Ambulatory Visit (INDEPENDENT_AMBULATORY_CARE_PROVIDER_SITE_OTHER): Payer: Medicare Other | Admitting: Family Medicine

## 2022-08-28 VITALS — Ht 63.5 in | Wt 121.0 lb

## 2022-08-28 DIAGNOSIS — S3210XD Unspecified fracture of sacrum, subsequent encounter for fracture with routine healing: Secondary | ICD-10-CM

## 2022-08-28 DIAGNOSIS — I5033 Acute on chronic diastolic (congestive) heart failure: Secondary | ICD-10-CM | POA: Diagnosis not present

## 2022-08-28 DIAGNOSIS — R600 Localized edema: Secondary | ICD-10-CM | POA: Diagnosis not present

## 2022-08-28 DIAGNOSIS — S3210XA Unspecified fracture of sacrum, initial encounter for closed fracture: Secondary | ICD-10-CM

## 2022-08-28 MED ORDER — OXYCODONE HCL 5 MG PO TABS: 5.0000 mg | ORAL_TABLET | Freq: Four times a day (QID) | ORAL | 0 refills | Status: DC | PRN

## 2022-08-28 NOTE — Progress Notes (Signed)
Virtual Visit via Telephone Note  I connected with Briana Fitzgerald on 08/28/22 at  4:15 PM EST by telephone and verified that I am speaking with the correct person using two identifiers.   I discussed the limitations, risks, security and privacy concerns of performing an evaluation and management service by telephone and the availability of in person appointments. I also discussed with the patient that there may be a patient responsible charge related to this service. The patient expressed understanding and agreed to proceed.  Location patient: home Location provider: work or home office Participants present for the call: patient, provider Patient did not have a visit in the prior 7 days to address this/these issue(s).   History of Present Illness: This call involved patient, her husband, patient's son Briana Fitzgerald, and his wife.  Mrs Zieger's chronic problems including history of diastolic heart failure, scoliosis, osteoporosis, osteoarthritis, restless leg syndrome, hyperlipidemia.  She had some chronic musculoskeletal complaints and has been in state of decline really for the past several years.  She has particularly had significant back difficulties and hip pain and has had several recent injections of steroid per sports medicine..  December 6th she was at home sitting on a chair and fell off onto the floor landing on her buttock.  She was then seen in the ER 2 days later and had imaging which showed acute to subacute bilateral sacral ala and central S2 sacral fractures.  No evidence for left hip fracture.  Her recent musculoskeletal complaints have been managed by Dr. Lynne Leader, one of our sports medicine physicians.  He actually did virtual visit this morning with patient and family and then sent me a note.  Recent concerns include poor pain control.  She had been on oxycodone 5 mg every 6 hours and this was increased this morning to 1-1/2 tablets every 6 hours.  She has also had evidence for some  possible volume overload with increased leg swelling and increased shortness of breath.  She has basically become almost nonambulatory.  Requires considerable assistance to get up onto her walker.  Home health referral was placed with [facility] but they have not been out yet.  She has also had a couple more falls in the past week and continues to have significant pelvic pain.  She has bilateral leg edema per family with some weeping edema.  Currently on Lasix 20 mg daily but she is only been taking 1/2 tablet daily.  Family prefer to avoid going back to the hospital if possible.  There had been some discussion this morning of possible palliative care or hospice referral.  Past Medical History:  Diagnosis Date   Arthritis    BACK PAIN 07/18/2008   Bowel obstruction ([place])    CONSTIPATION 10/02/2010   GERD 10/02/2010   Headache    IBS (irritable bowel syndrome)    LAMINECTOMY, LUMBAR, HX OF 06/16/2008   OSTEOARTHRITIS 10/02/2010   Pneumonia    RESTLESS LEG SYNDROME 10/02/2010   SACROILIAC JOINT DYSFUNCTION 06/16/2008   Sleep apnea    STYE 10/02/2010   TRANSIENT ISCHEMIC ATTACKS, HX OF 10/02/2010   Past Surgical History:  Procedure Laterality Date   ABDOMINAL HYSTERECTOMY  1979   TAH, fibroids   APOGEE / PERIGEE REPAIR  2005   [place]  2005   catarac,both eyes   LIVER BIOPSY  1997   benign, hemangioma resection   SPINE SURGERY  2006   laminectomy    reports that she has never  smoked. She has never used smokeless tobacco. She reports current alcohol use. She reports that she does not use drugs. family history includes Breast cancer in her paternal aunt; Diabetes in her father; Pancreatitis in her brother; Stroke in her father. No Known Allergies    Observations/Objective: Patient sounds cheerful and well on the phone. I do not appreciate any SOB. Speech and thought processing are grossly intact. Patient reported vitals:  Assessment and Plan:  Patient is a  88 year old female with multiple chronic problems as above who has had general state of decline for the past several years with multiple musculoskeletal complaints and particularly recent hip and back pain with recent fall and sacral fractures as above.  Currently has poor pain control and increasing peripheral edema with history of known diastolic dysfunction (heart failure with preserved ejection fraction).  Her pain has been managed by sports medicine and just this morning her oxycodone was increased to 5 mg 1 and half tablet every 6 hours  Sounds like she has significant volume overload and currently only taking 10 mg of Lasix daily.  1 valid concern that family raise is great difficulties that we will have getting her up if she has to urinate more often.  They currently have depends on her and getting up to go the bathroom is extreme hardship.  We discussed goals of care at some length. -They prefer NOT going back to the hospital if possible but know that this may be a necessity especially if her breathing continues to decline -They do prefer to try to get her stronger with rehab if possible -In balancing their desire for her to have some meaningful recovery and also to get assistance beyond level they can reasonably get from home health,  son Briana Fitzgerald) expressed interest in possible short-term rehab stay.  We explained this may not be easy to accomplish in the short-term. -We discussed social work consult to get some assistance with possible placement. -They are aware that hospital admission with placement from hospital may be more reasonable and more timely -In the short-term we recommended increasing her Lasix to 20 mg twice daily for the next 5 days and then drop back to 1 daily  Follow Up Instructions:   99441 5-10 99442 11-20 99443 21-30 I did not refer this patient for an OV in the next 24 hours for this/these issue(s).  I discussed the assessment and treatment plan with the patient.  The patient was provided an opportunity to ask questions and all were answered. The patient agreed with the plan and demonstrated an understanding of the instructions.   The patient was advised to call back or seek an in-person evaluation if the symptoms worsen or if the condition fails to improve as anticipated.  I provided 25 minutes of non-face-to-face time during this encounter.   Carolann Littler, MD

## 2022-09-05 ENCOUNTER — Telehealth: Payer: Self-pay

## 2022-09-05 NOTE — Progress Notes (Signed)
  Chronic Care Management   Note  09/05/2022 Name: Briana Fitzgerald MRN: [id number] DOB: 03/10/1932  [name] is a 88 y.o. year old female who is a primary care patient of [name], [name], MD. I reached out to [employer] by phone today in response to a referral sent by [name] PCP.  [name] was given information about Chronic Care Management services today including:  CCM service includes personalized support from designated clinical staff supervised by the physician, including individualized plan of care and coordination with other care providers 24/7 contact phone numbers for assistance for urgent and routine care needs. Service will only be billed when office clinical staff spend 20 minutes or more in a month to coordinate care. Only one practitioner may furnish and bill the service in a calendar month. The patient may stop CCM services at amy time (effective at the end of the month) by phone call to the office staff. The patient will be responsible for cost sharing (co-pay) or up to 20% of the service fee (after annual deductible is met)  [name]  agreedto scheduling an appointment with the CCM RN Case Manager   Follow up plan: Patient agreed to scheduled appointment with RN Case Manager on 09/18/2022(date/time).   [name], [place], [place] [address] Direct Dial: [phone] [email or web address]

## 2022-09-05 NOTE — Progress Notes (Signed)
  Care Coordination  Note  09/05/2022 Name: Briana Fitzgerald MRN: [id number] DOB: 06-18-1932  Briana Fitzgerald is a 88 y.o. year old female who is a primary care patient of [name], [name], MD. I reached out to [employer] by phone today to offer care coordination services.      [name] was given information about Care Coordination services today including:  The Care Coordination services include support from the care team which includes your Nurse Coordinator, Clinical Social Worker, or Pharmacist.  The Care Coordination team is here to help remove barriers to the health concerns and goals most important to you. Care Coordination services are voluntary and the patient may decline or stop services at any time by request to their care team member.   Patient agreed to services and verbal consent obtained.   Follow up plan: Telephone appointment with care coordination team member scheduled for:09/12/2022  [name], [place], [place] [address] Direct Dial: [phone] [name]

## 2022-09-12 ENCOUNTER — Ambulatory Visit: Payer: Self-pay | Admitting: Licensed Clinical Social Worker

## 2022-09-12 ENCOUNTER — Ambulatory Visit (INDEPENDENT_AMBULATORY_CARE_PROVIDER_SITE_OTHER): Payer: Medicare Other | Admitting: Family Medicine

## 2022-09-12 DIAGNOSIS — M79641 Pain in right hand: Secondary | ICD-10-CM

## 2022-09-12 DIAGNOSIS — M79642 Pain in left hand: Secondary | ICD-10-CM | POA: Diagnosis not present

## 2022-09-12 MED ORDER — OXYCODONE HCL 5 MG PO TABS: 5.0000 mg | ORAL_TABLET | Freq: Four times a day (QID) | ORAL | 0 refills | Status: AC | PRN

## 2022-09-12 NOTE — Progress Notes (Signed)
Virtual Visit  I connected with   Briana Fitzgerald  by a phone telemedicine application and verified that I am speaking with the correct person using two identifiers.   I discussed the limitations of evaluation and management by telemedicine and the availability of in person appointments. The patient expressed understanding and agreed to proceed. ? Location of the provider office ? Location of the patient home ? The names and roles of all persons participating in the visit. Patient, her husband and myself                                                                             Briana Fitzgerald is a 88 y.o. female who presents to [place] at [facility] today for hand pain bilaterally.   Kang was seen in the emergency room for sacral fracture and discharged home earlier this month.  She is not doing very well and has limited mobility.  Home health physical therapy was ordered but has not yet actually started visiting.  Over the last week or so she has developed pain in her bilateral hands associated with less mobility in her hands bilaterally.  She has pain and swelling at her MCPs and PIPs and some stiffness.  She has not tried much treatment for this yet.  She denies excessive shortness of breath chest pain or palpitation.  She does note lower extremity edema bilaterally.  Dx imaging: 08/23/22 L hip & L-spine MRI and pelvis XR  04/22/22 L-spine XR  04/02/20 L-spine MRI  02/15/20 L-spine XR Pertinent review of systems: No fevers or chills.  Relevant historical information: Heart failure.  Sacral fracture.     Assessment and Plan: 88 y.o. female with bilateral hand pain.  Etiology is unclear.  This is a new orthopedic/medical problem for her.  Most likely explanation is exacerbation of hand arthritis.  Unfortunately I cannot properly evaluate this with a phone call.  Discussed some basic treatment options which would work pretty well for hand pain due to  his arthritis exacerbation including heat Voltaren gel and Tylenol.  I am reluctant to use oral NSAIDs as she seems to be dealing with some fluid overload and oral NSAIDs could worsen that.  I did refill her oxycodone.  Additionally go to make sure that home health is engaged with occupational therapy for her hands as well as with home health physical therapy for her back and mobility.  Encouraged an in person visit as soon as possible.   PDMP not reviewed this encounter. Orders Placed This Encounter  Procedures   Ambulatory referral to [facility]    Referral Priority:   Routine    Referral Type:   Home Health Care    Referral Reason:   Specialty Services Required    Requested Specialty:   [place]    Number of Visits Requested:   1   Meds ordered this encounter  Medications   oxyCODONE (ROXICODONE) 5 MG immediate release tablet    Sig: Take 1-2 tablets (5-10 mg total) by mouth every 6 (six) hours as needed for up to 20 days for breakthrough pain.    Dispense:  40 tablet  Refill:  0     Discussed warning signs or symptoms. Please see discharge instructions. Patient expresses understanding.   The above documentation has been reviewed and is accurate and complete Lynne Leader, M.D.       Time: total phone time 21 mins

## 2022-09-18 ENCOUNTER — Telehealth: Payer: Self-pay | Admitting: Licensed Clinical Social Worker

## 2022-09-18 ENCOUNTER — Telehealth: Payer: Medicare Other

## 2022-09-18 ENCOUNTER — Telehealth: Payer: Self-pay

## 2022-09-18 DIAGNOSIS — I509 Heart failure, unspecified: Secondary | ICD-10-CM | POA: Diagnosis not present

## 2022-09-18 DIAGNOSIS — S3210XD Unspecified fracture of sacrum, subsequent encounter for fracture with routine healing: Secondary | ICD-10-CM | POA: Diagnosis not present

## 2022-09-18 DIAGNOSIS — Z9181 History of falling: Secondary | ICD-10-CM | POA: Diagnosis not present

## 2022-09-18 DIAGNOSIS — M19042 Primary osteoarthritis, left hand: Secondary | ICD-10-CM | POA: Diagnosis not present

## 2022-09-18 DIAGNOSIS — M19041 Primary osteoarthritis, right hand: Secondary | ICD-10-CM | POA: Diagnosis not present

## 2022-09-18 NOTE — Patient Instructions (Signed)
Visit Information  Thank you for taking time to visit with me today. Please don't hesitate to contact me if I can be of assistance to you.   Following are the goals we discussed today:   Goals Addressed             This Visit's Progress    Caregiver Strain-LCSW   On track    Care Coordination Interventions: Solution-Focused Strategies employed:  Active listening / Reflection utilized  Emotional Support Provided Caregiver stress acknowledged  Consideration of in-home help encouraged : options discussed Verbalization of feelings encouraged  Patient was seen by ED after a fall resulting in injury. Per family, Briana Fitzgerald never called to initiate services, which has disappointed the family and places pt at risk for additional injury. Currently pt's spouse is providing care to patient noting she has fallen 3 x since discharge. Pain has improved Spouse's goal is to improve pt's mobility and ensure safe environment. Pt has a walker and rollator in the home LCSW informed family of different levels of care and process to place patient in an SNF Patient and spouse rely on family for transportation needs. There are stairs in the home LCSW collaborated with [facility], spoke with [facility]. She was unable to find orders in the system and agreed to speak with [name] to inquire about initial referral. Informed [name] that per pt records, orders were accepted by [facility] while pt was inpt Update provided to family. LCSW encouraged to call pt's son's cell, Briana Fitzgerald [phone]            Please call the care guide team at [phone] if you need to cancel or reschedule your appointment.   If you are experiencing a Mental Health or [place] or need someone to talk to, please call the Suicide and Crisis Lifeline: 988 call 911   Patient verbalizes understanding of instructions and care plan provided today and agrees to view in [place]. Active MyChart status and patient understanding of  how to access instructions and care plan via MyChart confirmed with patient.     [name], MSW, [name] Phone [phone] 10:42 AM

## 2022-09-18 NOTE — Telephone Encounter (Addendum)
   CCM RN Visit Note   09/18/22 Name: TINCY CARSTENSEN MRN: [id number]      DOB: 09/24/1931  Subjective: Briana Fitzgerald is a 89 y.o. year old female who is a primary care patient of [name], Elberta Fortis, MD. The patient was referred to the Chronic Care Management team for assistance with care management needs subsequent to provider initiation of CCM services and plan of care.      An unsuccessful outreach attempt was made today for a scheduled CCM visit.   PLAN: Unable to leave a voice message. A member of the care management team will make additional outreach attempts.   Addendum: Message received from LCSW at 1104. Reports speaking to patient's son Greggory Stallion who indicated he was still waiting for a nurse call.   Informed LCSW that a call was attempted as scheduled at 0900. Attempted to reach patient at [phone]. Per Tyson Babinski provided an alternate contact number for his spouse Misty [phone].  Appointment cancelled in system to prevent "No Show" Will re-attempt outreach this week.  Katha Cabal RN Care Manager/Chronic Care Management [phone]

## 2022-09-18 NOTE — Patient Outreach (Signed)
  Care Coordination Late Entry  Initial Visit Note   Outreach completed on 09/12/22 Name: Briana Fitzgerald MRN: [id number] DOB: 04/12/1932  Briana Fitzgerald Space is a 89 y.o. year old female who sees [name], [name], MD for primary care. I spoke with  Briana Fitzgerald Briana Fitzgerald by phone today.  What matters to the patients health and wellness today?  Caregiver Fatigue and HH orders    Goals Addressed             This Visit's Progress    Caregiver Strain-LCSW   On track    Care Coordination Interventions: Solution-Focused Strategies employed:  Active listening / Reflection utilized  Emotional Support Provided Caregiver stress acknowledged  Consideration of in-home help encouraged : options discussed Verbalization of feelings encouraged  Patient was seen by ED after a fall resulting in injury. Per family, Briana Fitzgerald never called to initiate services, which has disappointed the family and places pt at risk for additional injury. Currently pt's spouse is providing care to patient noting she has fallen 3 x since discharge. Pain has improved Spouse's goal is to improve pt's mobility and ensure safe environment. Pt has a walker and rollator in the home LCSW informed family of different levels of care and process to place patient in an SNF Patient and spouse rely on family for transportation needs. There are stairs in the home LCSW collaborated with [facility], spoke with [place]. She was unable to find orders in the system and agreed to speak with [name] to inquire about initial referral. Informed [name] that per pt records, orders were accepted by [facility] while pt was inpt Update provided to family. LCSW encouraged to call pt's son's cell, Briana Fitzgerald [phone]             SDOH assessments and interventions completed:  Yes  SDOH Interventions Today    Flowsheet Row Most Recent Value  SDOH Interventions   Food Insecurity Interventions Intervention Not Indicated  Housing Interventions  Intervention Not Indicated        Care Coordination Interventions:  Yes, provided   Follow up plan: Follow up call scheduled for 1-2 weeks    Encounter Outcome:  Pt. Visit Completed   [name], MSW, [email or web address] Phone [phone] 10:42 AM

## 2022-09-18 NOTE — Patient Outreach (Signed)
  Care Coordination   Follow Up Visit Note   09/18/2022 Name: Briana Fitzgerald MRN: [id number] DOB: 1932/06/22  Briana Fitzgerald is a 89 y.o. year old female who sees [name], [name], MD for primary care. I spoke with  Briana Fitzgerald, son, Briana Fitzgerald, by phone today.  What matters to the patients health and wellness today?  Home Health    Goals Addressed             This Visit's Progress    Caregiver Strain-LCSW   On track    Care Coordination Interventions: Solution-Focused Strategies employed:  Active listening / Reflection utilized  Emotional Support Provided Caregiver stress acknowledged  Consideration of in-home help encouraged : options discussed Verbalization of feelings encouraged  LCSW spoke with Briana Fitzgerald, with [facility]. She states that a representative will visit the home today for start of care LCSW spoke with Briana Fitzgerald to provide update. He states pt is managing her pain better. The family continues to work with her on managing medications and keeping her motivated to be compliant with tx plan States his spouse, Briana Fitzgerald [phone] is with parents, awaiting call from [place]; however, they have yet to hear from her. LCSW collaborated with Briana Fitzgerald and informed her family's request to call today Family would like to discuss options for cathetar placement with RNCM and/or PCP to assist with decreasing fall risk and encourage pt to take medications with side effects of frequent urination Patient has not fallen this week               SDOH assessments and interventions completed:  No     Care Coordination Interventions:  Yes, provided   Follow up plan: Follow up call scheduled for 1-2 weeks    Encounter Outcome:  Pt. Visit Completed   Briana Fitzgerald, MSW, [email or web address] Phone [phone] 5:24 PM

## 2022-09-18 NOTE — Patient Instructions (Signed)
Visit Information  Thank you for taking time to visit with me today. Please don't hesitate to contact me if I can be of assistance to you.   Following are the goals we discussed today:   Goals Addressed             This Visit's Progress    Caregiver Strain-LCSW   On track    Care Coordination Interventions: Solution-Focused Strategies employed:  Active listening / Reflection utilized  Emotional Support Provided Caregiver stress acknowledged  Consideration of in-home help encouraged : options discussed Verbalization of feelings encouraged  LCSW spoke with [name], with [facility]. She states that a representative will visit the home today for start of care LCSW spoke with [name] to provide update. He states pt is managing her pain better. The family continues to work with her on managing medications and keeping her motivated to be compliant with tx plan States his spouse, Deicy Rusk [phone] is with parents, awaiting call from [place]; however, they have yet to hear from her. LCSW collaborated with Ms. McCray and informed her family's request to call today Family would like to discuss options for cathetar placement with RNCM and/or PCP to assist with decreasing fall risk and encourage pt to take medications with side effects of frequent urination Patient has not fallen this week               Our next appointment is by telephone on 10/02/22 at 11 AM  Please call the care guide team at [phone] if you need to cancel or reschedule your appointment.   If you are experiencing a Mental Health or [place] or need someone to talk to, please call the Suicide and Crisis Lifeline: 988 call 911   Patient verbalizes understanding of instructions and care plan provided today and agrees to view in [place]. Active MyChart status and patient understanding of how to access instructions and care plan via MyChart confirmed with patient.     [name], MSW,  [email or web address] Phone [phone] 5:25 PM

## 2022-09-20 ENCOUNTER — Ambulatory Visit: Payer: Self-pay

## 2022-09-20 ENCOUNTER — Telehealth: Payer: Self-pay

## 2022-09-20 ENCOUNTER — Telehealth: Payer: Self-pay | Admitting: Licensed Clinical Social Worker

## 2022-09-20 DIAGNOSIS — I5033 Acute on chronic diastolic (congestive) heart failure: Secondary | ICD-10-CM

## 2022-09-20 NOTE — Telephone Encounter (Signed)
  Care Management   Visit Note  09/20/2022 Name: Briana Fitzgerald MRN: [id number] DOB: [birth year]  Subjective: Briana Fitzgerald is a 89 y.o. year old female who is a primary care patient of [name], [name], MD. The Care Management team was consulted for assistance.      Engaged with patient {CCMTELEPHONEFACETOFACE:24906}.   Assessment:   Interventions: {CCM RNCM INTERVENTIONS:22296}  Recommendations:     Consent to Services:  {CAREMANAGEMENTCONSENTOPTIONS:24923}  Plan: {CM FOLLOW UP PLAN:22241}  SIG

## 2022-09-20 NOTE — Telephone Encounter (Signed)
   CCM RN Visit Note   09/20/22 Name: Briana Fitzgerald MRN: [id number]      DOB: 1932-06-25  Subjective: Briana Fitzgerald is a 89 y.o. year old female who is a primary care patient of [name], [name], MD. The patient was referred to the Chronic Care Management team for assistance with care management needs subsequent to provider initiation of CCM services and plan of care.      Brief outreach with patient's son [name] (listed on DPR) regarding initial CCM assessment. Reports not being with patient at the time of the call. Denies urgent concerns. Reports speaking with LCSW, [place] today. Reports his spouse may be available to complete the initial outreach today.    PLAN: Will attempt to complete assessment later today.   Horris Latino RN Care Manager/Chronic Care Management [phone]

## 2022-09-20 NOTE — Chronic Care Management (AMB) (Signed)
Error Please Disregard

## 2022-09-23 NOTE — Telephone Encounter (Signed)
Duplicate encounter:Please Disregard

## 2022-09-24 ENCOUNTER — Ambulatory Visit: Payer: Medicare Other

## 2022-09-24 ENCOUNTER — Telehealth: Payer: Self-pay

## 2022-09-24 ENCOUNTER — Ambulatory Visit: Payer: Self-pay | Admitting: *Deleted

## 2022-09-24 ENCOUNTER — Telehealth: Payer: Self-pay | Admitting: Licensed Clinical Social Worker

## 2022-09-24 DIAGNOSIS — S3210XD Unspecified fracture of sacrum, subsequent encounter for fracture with routine healing: Secondary | ICD-10-CM | POA: Diagnosis not present

## 2022-09-24 DIAGNOSIS — I5033 Acute on chronic diastolic (congestive) heart failure: Secondary | ICD-10-CM

## 2022-09-24 DIAGNOSIS — M19042 Primary osteoarthritis, left hand: Secondary | ICD-10-CM | POA: Diagnosis not present

## 2022-09-24 DIAGNOSIS — I509 Heart failure, unspecified: Secondary | ICD-10-CM | POA: Diagnosis not present

## 2022-09-24 DIAGNOSIS — M19041 Primary osteoarthritis, right hand: Secondary | ICD-10-CM | POA: Diagnosis not present

## 2022-09-24 DIAGNOSIS — M25512 Pain in left shoulder: Secondary | ICD-10-CM

## 2022-09-24 DIAGNOSIS — Z9181 History of falling: Secondary | ICD-10-CM | POA: Diagnosis not present

## 2022-09-24 NOTE — Patient Outreach (Unsigned)
Care Coordination   Home Visit Note   09/25/2022 Name: Briana Fitzgerald MRN: [id number] DOB: 03-Dec-1931  Briana Fitzgerald is a 89 y.o. year old female who sees [name], [name], MD for primary care. I visited  Briana Fitzgerald in their home today.  What matters to the patients health and wellness today?  RECOVERING FROM HF EXACERBATION AND FALL WITH FX OF SACRUM.  PT WENT TO ED Aug 23, 2022, AFTER A FALL FROM A CHAIR. FOUND TO HAVE FX'D SACRUM. FOLLOWING D/C HOME PT HAS FALLEN AN ADDITIONAL 3 TIMES AND BEGAN HAVING SXS OF HF EXACERBATION: GROSS PERIPHERAL EDEMA AND SOB. HER FUROSEMIDE WAS INCREASED BY DR. Elease Hashimoto ON 08/28/22  TO 20 MG   BID FOR 5 DAYS AND THEN TO DROP BACK TO 1, 20 MG TAB DAILY.  AT SOME POINT PT STARTED TAKING THE FUROSEMIDE 20 MG TID (I DO NOT SEE AN ORDER FOR THIS REGIMEN) THIS HAS BEEN PROBLEMATIC AS THE PT DOESN'T LIKE THE EFFECT AND HAS BEEN SOMEWHAT NONCOMPLIANT.   PT ALSO COMPLAINED OF CONSTIPATION. NP RECOMMENDED MIRALAX AS DIRECTED AND MAY EXPERIMENT WITH DOSING FOR BEST RESULT. FOLLOW INSTRUCTIONS FOR AMOUNT OF POWDER WITH AMOUNT OF FLUID. EAT PRUNES, APRICOTS OR AN APPLY A DAY. WALK AS MUCH AS POSSIBLE AND MAINTAIN ADEQUATE HYDRATION.  CALLED DR. Anastasio Auerbach OFFICE TO REQUEST FULL DOSE OF FUROSEMIDE _____ IN AM AND REQUESTED ORDER FOR BMET AND RENAL PANEL (ORDER TO GO TO [facility]).  MAY WANT TO CONSIDER A LOW DOSE ANTIDEPRESSANT PHQ9 SCORE 7.  Outpatient Encounter Medications as of 09/24/2022  Medication Sig   acetaminophen (TYLENOL) 500 MG tablet Take 500 mg by mouth every 6 (six) hours as needed.   AMBULATORY NON FORMULARY MEDICATION Rolling walker disp 1   Calcium Carbonate-Vitamin D 600-400 MG-UNIT tablet Take 1 tablet by mouth daily.   COVID-19 mRNA Vac-TriS, Pfizer, (PFIZER-BIONT COVID-19 VAC-TRIS) SUSP injection Inject into the muscle.   diclofenac (VOLTAREN) 25 MG EC tablet Take 1-2 tablets (25-50 mg total) by mouth 2 (two) times daily as needed for  moderate pain.   Fluocinolone Acetonide 0.01 % SHAM Apply topically to scalp once daily as needed for scalp pruritis and leave on for 5 minutes and then rinse off   furosemide (LASIX) 20 MG tablet TAKE 1 TABLET BY MOUTH EVERY DAY   Misc Natural Products (TART CHERRY ADVANCED PO) Take 1,200 mg by mouth daily.   mometasone (ELOCON) 0.1 % lotion Apply once daily to affected areas of scalp as needed   Omega-3 Fatty Acids (SALMON OIL-1000 PO) Take 1 tablet by mouth daily.   pramipexole (MIRAPEX) 0.5 MG tablet Take 1 tablet (0.5 mg total) by mouth at bedtime as needed. Take one tablet by mouth daily as needed for restless legs.   pyridoxine (B-6) 100 MG tablet Take 100 mg by mouth daily.   vitamin B-12 (CYANOCOBALAMIN) 500 MCG tablet Take 500 mcg by mouth daily.   vitamin C (ASCORBIC ACID) 500 MG tablet Take 500 mg by mouth 2 (two) times a week.   vitamin E 400 UNIT capsule Take 400 Units by mouth daily.   ferrous sulfate 324 MG TBEC Take 324 mg by mouth 2 (two) times a week. (Patient not taking: Reported on 09/24/2022)   oxyCODONE (ROXICODONE) 5 MG immediate release tablet Take 1-2 tablets (5-10 mg total) by mouth every 6 (six) hours as needed for up to 20 days for breakthrough pain. (Patient not taking: Reported on 09/24/2022)   rosuvastatin (CRESTOR) 5 MG tablet  Take 1 tablet (5 mg total) by mouth daily. (Patient not taking: Reported on 09/24/2022)   Ubrogepant (UBRELVY) 50 MG TABS Take one tablet by mouth as needed for migraine headache.  May repeat dose times one after 2 hours as needed for ongoing headache. (Patient not taking: Reported on 09/24/2022)   No facility-administered encounter medications on file as of 09/24/2022.   BP 128/62   Pulse 80   Resp 18   SpO2 95%  pt unable to weigh Alert, oriented, HOH RRR Lungs are clear Extremities: 2+ edema, healing skin tears on arms and legs, VERY dry skin.    Goals Addressed               This Visit's Progress     Patient Stated     I want to  stay out of the hospital. (pt-stated)        Care Coordination Interventions: Basic overview and discussion of pathophysiology of Heart Failure reviewed Provided education on low sodium diet Reviewed Heart Failure Action Plan in depth and provided written copy Reviewed role of diuretics in prevention of fluid overload and management of heart failure; Screening for signs and symptoms of depression related to chronic disease state  Assessed social determinant of health barriers  Encouraged maximum effort with all her home health therapies to improve and maintain her mobility. Do PT exercises on days PT does not some. Fall precautions all the time: use walker, ask for assistance, exercise caution when in the act of sitting, arise slowly and stand for several minutes before walking.  Sending CHF Action plan visual guide to self care and when to call the MD.  Did PHQ9 score 7/30. Pt husband and daughter in law  report pt responses were what she thought evaluator wanted to hear and not what she has been presenting like at home.  They also advise she has been extremely irritable.          SDOH assessments and interventions completed:  Yes  SDOH Interventions Today    Flowsheet Row Most Recent Value  SDOH Interventions   Depression Interventions/Treatment  Counseling        Care Coordination Interventions:  Yes, provided   Follow up plan: Follow up call scheduled for 2 weeks.    Encounter Outcome:  Pt. Visit Completed   Kayleen Memos C. Myrtie Neither, MSN, [facility] Gerontological Nurse Practitioner [place] [facility] Care Management [phone]

## 2022-09-24 NOTE — Telephone Encounter (Signed)
Home health nursing referral placed

## 2022-09-24 NOTE — Patient Instructions (Signed)
Visit Information  Thank you for taking time to visit with me today. Please don't hesitate to contact me if I can be of assistance to you.   Following are the goals we discussed today:   Goals Addressed             This Visit's Progress    Caregiver Strain-LCSW   On track    Care Coordination Interventions: Solution-Focused Strategies employed:  Active listening / Reflection utilized  Emotional Support Provided Caregiver stress acknowledged  Consideration of in-home help encouraged : options discussed Verbalization of feelings encouraged  LCSW spoke with pt's son [facility] PT completed visit. Family had rapport with physical therapist, who provided pt with exercises to complete daily. [name] will speak with insurance to obtain auth for amount of sessions, and update family next week, Patient appears motivated to do the exercises and participate in PT weekly. Ankles and legs are looking better due to compliance with Lasix Family looks forward to RN calling to assist with maintaining pt's medication compliance and supporting family with keeping pt accountable                Our next appointment is by telephone on 10/02/22 at 11 AM  Please call the care guide team at [phone] if you need to cancel or reschedule your appointment.   If you are experiencing a Mental Health or [place] or need someone to talk to, please call the Suicide and Crisis Lifeline: 988 call 911   Patient verbalizes understanding of instructions and care plan provided today and agrees to view in [place]. Active MyChart status and patient understanding of how to access instructions and care plan via MyChart confirmed with patient.     [name], MSW, [email or web address] Phone [phone] 2:47 PM

## 2022-09-24 NOTE — Chronic Care Management (AMB) (Signed)
Error Please Disregard

## 2022-09-24 NOTE — Patient Outreach (Signed)
  Care Coordination Late Entry  Follow Up Visit Note   Outreach completed 09/20/22 Name: Briana Fitzgerald MRN: [id number] DOB: May 09, 1932  Briana Fitzgerald is a 89 y.o. year old female who sees [name], [name], MD for primary care. I spoke with  Briana Gilbert Gaba's son by phone today.  What matters to the patients health and wellness today?  Care Coordination    Goals Addressed             This Visit's Progress    Caregiver Strain-LCSW   On track    Care Coordination Interventions: Solution-Focused Strategies employed:  Active listening / Reflection utilized  Emotional Support Provided Caregiver stress acknowledged  Consideration of in-home help encouraged : options discussed Verbalization of feelings encouraged  LCSW spoke with pt's son [facility] PT completed visit. Family had rapport with physical therapist, who provided pt with exercises to complete daily. Briana Fitzgerald will speak with insurance to obtain auth for amount of sessions, and update family next week, Patient appears motivated to do the exercises and participate in PT weekly. Ankles and legs are looking better due to compliance with Lasix Family looks forward to RN calling to assist with maintaining pt's medication compliance and supporting family with keeping pt accountable                SDOH assessments and interventions completed:  No     Care Coordination Interventions:  Yes, provided   Follow up plan: Follow up call scheduled for 2-4 weeks    Encounter Outcome:  Pt. Visit Completed   [name], MSW, [email or web address] Phone [phone] 2:46 PM

## 2022-09-24 NOTE — Telephone Encounter (Signed)
-----   Message from Eulas Post, MD sent at 09/24/2022  7:34 AM EST ----- Regarding: RE: Request for evaluation by [place] to place referral for home health care nursing services, as requested ----- Message ----- From: Neldon Labella, RN Sent: 09/20/2022   6:42 PM EST To: Eulas Post, MD Subject: Request for evaluation by Home Health Nurse    Hello Dr. Elease Hashimoto,  Mrs Shaquana's caregivers are requesting home health nursing services. Referrals for PT and OT have been submitted. I spoke with the staff at [facility] and they will need an order for nurse evaluation and additional visits.   Please fax the order to: [phone]  Thank You!

## 2022-09-25 ENCOUNTER — Telehealth: Payer: Self-pay | Admitting: Family Medicine

## 2022-09-25 DIAGNOSIS — Z9181 History of falling: Secondary | ICD-10-CM | POA: Diagnosis not present

## 2022-09-25 DIAGNOSIS — M19041 Primary osteoarthritis, right hand: Secondary | ICD-10-CM | POA: Diagnosis not present

## 2022-09-25 DIAGNOSIS — M19042 Primary osteoarthritis, left hand: Secondary | ICD-10-CM | POA: Diagnosis not present

## 2022-09-25 DIAGNOSIS — S3210XD Unspecified fracture of sacrum, subsequent encounter for fracture with routine healing: Secondary | ICD-10-CM | POA: Diagnosis not present

## 2022-09-25 DIAGNOSIS — I509 Heart failure, unspecified: Secondary | ICD-10-CM | POA: Diagnosis not present

## 2022-09-25 NOTE — Telephone Encounter (Signed)
I spoke with Briana Fitzgerald and she reported the patient has had 2 + pitting edema in both legs and her lungs are clear. Briana Fitzgerald inquired if PCP thinks an Increase in Lasix dosage is needed at this time with current levels of edema?

## 2022-09-25 NOTE — Telephone Encounter (Signed)
Briana Fitzgerald is returning mykal call

## 2022-09-25 NOTE — Telephone Encounter (Signed)
[name] / Care Manager with [place]   Re:  Lasix   [phone]  Please call her back at your earliest convenience.

## 2022-09-25 NOTE — Telephone Encounter (Signed)
Left a message for Kayleen Memos to return my call.

## 2022-09-25 NOTE — Telephone Encounter (Signed)
I spoke with Kayleen Memos and she stated she visited the patient yesterday and the patient is doing well. Kayleen Memos reported the patient had received a new diagnosis of heart failure and is currently taking Lasix 20 mg TID. Kayleen Memos stated the patient is not taking medication as prescribed and inquired if she can have verbal permission to administer Lasix 60 mg in the morning as patient is not compliant with directions for medication. Kayleen Memos also stated the patient may need a BMET and Renal labs as she has not had this completed since starting medication. [name] is currently the patient home health service provider and she would like orders sent to them in regards to this.

## 2022-09-26 ENCOUNTER — Telehealth: Payer: Self-pay | Admitting: *Deleted

## 2022-09-26 NOTE — Telephone Encounter (Signed)
Kayleen Memos informed of the message below and expressed understanding

## 2022-09-26 NOTE — Patient Outreach (Signed)
  Care Coordination   Follow Up Visit Note   09/26/2022 Name: ANIAYAH ALANIZ MRN: [id number] DOB: 1932-02-23  Severiano Gilbert Lewing is a 89 y.o. year old female who sees [name], [name], MD for primary care. I spoke with  Mr. Wiliam Ke today.  What matters to the patients health and wellness today?  Manage HF.    Goals Addressed               This Visit's Progress     Patient Stated     I want to stay out of the hospital. (pt-stated)   On track     Care Coordination Interventions: Relayed Dr. Erick Blinks instructions to reduce her furosemide dose from what she has been taking 20 mg tid to FUROSEMIDE 20 MG 2 TABLETS IN THE AM. Advised that Dr. Elease Hashimoto is ordering lab work to check on her kidney function. Since pt cannot stand to weigh yet, family to pay attention to leg swelling and breathing and to alert Dr. Elease Hashimoto if increased swelling and worsening of her respiratory effort for instructions to keep her out of the hospital. Sending HF Action Plan today and visual of low salt and high salt foods. Do not use salt. Look at labels for salt/sodium content and serving sizes. Do not exceed 2000 mg per day.          SDOH assessments and interventions completed:  Yes   PREVIOUSLY ADDRESSED.  Care Coordination Interventions:  Yes, provided   Follow up plan: Follow up call scheduled for 2 WEEKS.    Encounter Outcome:  Pt. Visit Completed   Kayleen Memos C. Myrtie Neither, MSN, [facility] Gerontological Nurse Practitioner [facility] Care Management [phone]

## 2022-09-27 DIAGNOSIS — I509 Heart failure, unspecified: Secondary | ICD-10-CM | POA: Diagnosis not present

## 2022-09-27 DIAGNOSIS — M19042 Primary osteoarthritis, left hand: Secondary | ICD-10-CM | POA: Diagnosis not present

## 2022-09-27 DIAGNOSIS — M19041 Primary osteoarthritis, right hand: Secondary | ICD-10-CM | POA: Diagnosis not present

## 2022-09-27 DIAGNOSIS — Z9181 History of falling: Secondary | ICD-10-CM | POA: Diagnosis not present

## 2022-09-27 DIAGNOSIS — S3210XD Unspecified fracture of sacrum, subsequent encounter for fracture with routine healing: Secondary | ICD-10-CM | POA: Diagnosis not present

## 2022-09-27 NOTE — Patient Outreach (Signed)
  Care Coordination Late Entry  Collaboration Note    Outreach completed 09/24/22 Name: Briana Fitzgerald MRN: [id number] DOB: 1932-07-08  Briana Fitzgerald is a 89 y.o. year old female who sees [name], [name], MD for primary care.  The  CCM RNCM and LCSW met today to review patient care needs and barriers.     SDOH assessments and interventions completed:  No     Care Coordination Interventions Activated:  Yes   Care Coordination Interventions:  Yes, provided   Follow up plan: Follow up call scheduled for 1-2 weeks    Multidisciplinary Team Attendees:   Neldon Labella, RNCM [name], LCSW  Scribe for Multidisciplinary Case Review:   [name], MSW, [email or web address] Phone [phone] 12:43 PM

## 2022-09-30 DIAGNOSIS — M19042 Primary osteoarthritis, left hand: Secondary | ICD-10-CM | POA: Diagnosis not present

## 2022-09-30 DIAGNOSIS — Z9181 History of falling: Secondary | ICD-10-CM | POA: Diagnosis not present

## 2022-09-30 DIAGNOSIS — I509 Heart failure, unspecified: Secondary | ICD-10-CM | POA: Diagnosis not present

## 2022-09-30 DIAGNOSIS — M19041 Primary osteoarthritis, right hand: Secondary | ICD-10-CM | POA: Diagnosis not present

## 2022-09-30 DIAGNOSIS — S3210XD Unspecified fracture of sacrum, subsequent encounter for fracture with routine healing: Secondary | ICD-10-CM | POA: Diagnosis not present

## 2022-10-01 DIAGNOSIS — M19042 Primary osteoarthritis, left hand: Secondary | ICD-10-CM | POA: Diagnosis not present

## 2022-10-01 DIAGNOSIS — S3210XD Unspecified fracture of sacrum, subsequent encounter for fracture with routine healing: Secondary | ICD-10-CM | POA: Diagnosis not present

## 2022-10-01 DIAGNOSIS — M19041 Primary osteoarthritis, right hand: Secondary | ICD-10-CM | POA: Diagnosis not present

## 2022-10-01 DIAGNOSIS — I509 Heart failure, unspecified: Secondary | ICD-10-CM | POA: Diagnosis not present

## 2022-10-01 DIAGNOSIS — Z9181 History of falling: Secondary | ICD-10-CM | POA: Diagnosis not present

## 2022-10-02 ENCOUNTER — Ambulatory Visit: Payer: Self-pay | Admitting: Licensed Clinical Social Worker

## 2022-10-02 DIAGNOSIS — Z9181 History of falling: Secondary | ICD-10-CM | POA: Diagnosis not present

## 2022-10-02 DIAGNOSIS — I509 Heart failure, unspecified: Secondary | ICD-10-CM | POA: Diagnosis not present

## 2022-10-02 DIAGNOSIS — M19041 Primary osteoarthritis, right hand: Secondary | ICD-10-CM | POA: Diagnosis not present

## 2022-10-02 DIAGNOSIS — M19042 Primary osteoarthritis, left hand: Secondary | ICD-10-CM | POA: Diagnosis not present

## 2022-10-02 DIAGNOSIS — S3210XD Unspecified fracture of sacrum, subsequent encounter for fracture with routine healing: Secondary | ICD-10-CM | POA: Diagnosis not present

## 2022-10-02 DIAGNOSIS — M5030 Other cervical disc degeneration, unspecified cervical region: Secondary | ICD-10-CM

## 2022-10-03 DIAGNOSIS — S3210XD Unspecified fracture of sacrum, subsequent encounter for fracture with routine healing: Secondary | ICD-10-CM | POA: Diagnosis not present

## 2022-10-03 DIAGNOSIS — Z9181 History of falling: Secondary | ICD-10-CM | POA: Diagnosis not present

## 2022-10-03 DIAGNOSIS — M19042 Primary osteoarthritis, left hand: Secondary | ICD-10-CM | POA: Diagnosis not present

## 2022-10-03 DIAGNOSIS — M19041 Primary osteoarthritis, right hand: Secondary | ICD-10-CM | POA: Diagnosis not present

## 2022-10-03 DIAGNOSIS — I509 Heart failure, unspecified: Secondary | ICD-10-CM | POA: Diagnosis not present

## 2022-10-04 NOTE — Patient Outreach (Signed)
  Care Coordination Late Entry  Follow Up Visit Note   Outreach completed 10/02/22 Name: Briana Fitzgerald MRN: [id number] DOB: 1932/05/02  Briana Fitzgerald is a 89 y.o. year old female who sees Fitzgerald, Briana Sierras, MD for primary care. I spoke with  Briana Fitzgerald's daughter in law, Briana Fitzgerald, by phone today.  What matters to the patients health and wellness today?  Supportive Resources    Goals Addressed             This Visit's Progress    Caregiver Strain-LCSW   On track    Care Coordination Interventions: Solution-Focused Strategies employed:  Active listening / Reflection utilized  Emotional Support Provided Caregiver stress acknowledged  Consideration of in-home help encouraged : options discussed Verbalization of feelings encouraged  LCSW spoke with pt's daughter in law, Briana Fitzgerald Patient is participating in PT (2 x weekly) and OT (weekly) Family have discussed Paramedicine Referral to strengthen support Family visits daily and attends appts LCSW inquired about barriers. Due to stairs, pt is unable to ambulate out of the home. LCSW informed family of Aging Gracefully. Patient is interested in obtaining a ramp. LCSW will complete referral                   SDOH assessments and interventions completed:  No     Care Coordination Interventions:  Yes, provided   Follow up plan: Follow up call scheduled for 4-6 weeks    Encounter Outcome:  Pt. Visit Completed   [name], MSW, [name] Phone [phone] 6:40 PM

## 2022-10-04 NOTE — Patient Instructions (Signed)
Visit Information  Thank you for taking time to visit with me today. Please don't hesitate to contact me if I can be of assistance to you.   Following are the goals we discussed today:   Goals Addressed             This Visit's Progress    Caregiver Strain-LCSW   On track    Care Coordination Interventions: Solution-Focused Strategies employed:  Active listening / Reflection utilized  Emotional Support Provided Caregiver stress acknowledged  Consideration of in-home help encouraged : options discussed Verbalization of feelings encouraged  LCSW spoke with pt's daughter in law, Erline Levine Patient is participating in PT (2 x weekly) and OT (weekly) Family have discussed Paramedicine Referral to strengthen support Family visits daily and attends appts LCSW inquired about barriers. Due to stairs, pt is unable to ambulate out of the home. LCSW informed family of Aging Gracefully. Patient is interested in obtaining a ramp. LCSW will complete referral                   Our next appointment is by telephone on 10/25/22 at 11 AM  Please call the care guide team at [phone] if you need to cancel or reschedule your appointment.   If you are experiencing a Mental Health or [place] or need someone to talk to, please call the Suicide and Crisis Lifeline: 988 call 911   Patient verbalizes understanding of instructions and care plan provided today and agrees to view in [place]. Active MyChart status and patient understanding of how to access instructions and care plan via MyChart confirmed with patient.     [name], MSW, [email or web address] Phone [phone] 6:41 PM

## 2022-10-07 ENCOUNTER — Telehealth: Payer: Self-pay | Admitting: *Deleted

## 2022-10-07 ENCOUNTER — Encounter: Payer: Self-pay | Admitting: *Deleted

## 2022-10-07 ENCOUNTER — Telehealth: Payer: Self-pay

## 2022-10-07 DIAGNOSIS — I509 Heart failure, unspecified: Secondary | ICD-10-CM | POA: Diagnosis not present

## 2022-10-07 DIAGNOSIS — S3210XD Unspecified fracture of sacrum, subsequent encounter for fracture with routine healing: Secondary | ICD-10-CM | POA: Diagnosis not present

## 2022-10-07 DIAGNOSIS — M19041 Primary osteoarthritis, right hand: Secondary | ICD-10-CM | POA: Diagnosis not present

## 2022-10-07 DIAGNOSIS — Z9181 History of falling: Secondary | ICD-10-CM | POA: Diagnosis not present

## 2022-10-07 DIAGNOSIS — M19042 Primary osteoarthritis, left hand: Secondary | ICD-10-CM | POA: Diagnosis not present

## 2022-10-07 NOTE — Telephone Encounter (Signed)
   Telephone encounter was:  Successful.  10/07/2022 Name: Briana Fitzgerald MRN: [id number] DOB: 09-29-1931  Briana Fitzgerald is a 89 y.o. year old female who is a primary care patient of [name], [name], MD . The community resource team was consulted for assistance with  Aging Gracefully program, ramp program.  Care guide performed the following interventions: Spoke with patient's spouse Theodorus. Verified home address to mail information for the Aging Gracefully program. Gave contact information for [facility] ramp program.    Follow Up Plan:  No further follow up planned at this time. The patient has been provided with needed resources.  [place] Resource Care Guide   [email or web address]  ?? [phone]   Website: [email or web address]  [email or web address]

## 2022-10-07 NOTE — Patient Outreach (Signed)
  Care Coordination   Follow Up Visit Note   10/07/2022 Name: Briana Fitzgerald MRN: [id number] DOB: [birth year]  Briana Fitzgerald is a 89 y.o. year old female who sees [name], [name], MD for primary care. I spoke with  MR. Cuccaro TODAY.  What matters to the patients health and wellness today?  WORKING ON CONTINUOUS GENERAL HEALTH IMPROVEMENT.    Goals Addressed               This Visit's Progress     Patient Stated     I want to stay out of the hospital. (pt-stated)        Care Coordination Interventions: Advised patient to weigh each morning after emptying bladder Discussed importance of daily weight and advised patient to weigh and record daily THESE TASKS ARE ONLY TO BE DONE WHEN PT IS ASSISTED TO DO SO. REQUESTED MR. Winders TO ASK PT TO ASSIST ON HOME VISITS TO BEGIN. THE LOW SODIUM FOOD CHART AND HF ACTION PLAN HAS BEEN RECEIVED AND REVIEWED.         SDOH assessments and interventions completed:  Yes  PREVIOUSLY ADDRESSED.   Care Coordination Interventions:  Yes, provided   Follow up plan: Follow up call scheduled for 2 WEEKS.    Encounter Outcome:  Pt. Visit Completed   Kayleen Memos C. Myrtie Neither, MSN, [facility] Nurse Practitioner [facility] Care Management [phone]

## 2022-10-09 ENCOUNTER — Telehealth: Payer: Self-pay | Admitting: Family Medicine

## 2022-10-09 ENCOUNTER — Telehealth: Payer: Self-pay

## 2022-10-09 DIAGNOSIS — M19041 Primary osteoarthritis, right hand: Secondary | ICD-10-CM | POA: Diagnosis not present

## 2022-10-09 DIAGNOSIS — M19042 Primary osteoarthritis, left hand: Secondary | ICD-10-CM | POA: Diagnosis not present

## 2022-10-09 DIAGNOSIS — S3210XD Unspecified fracture of sacrum, subsequent encounter for fracture with routine healing: Secondary | ICD-10-CM | POA: Diagnosis not present

## 2022-10-09 DIAGNOSIS — I509 Heart failure, unspecified: Secondary | ICD-10-CM | POA: Diagnosis not present

## 2022-10-09 DIAGNOSIS — Z9181 History of falling: Secondary | ICD-10-CM | POA: Diagnosis not present

## 2022-10-09 NOTE — Telephone Encounter (Signed)
Pearla Dubonnet with [facility] (PT) [phone] (Ok to leave a detailed message on this line)   Reporting Findings:  Pt appears dehydrated today Pt complaining of consistent cramping BP: 80 over 50 HR: 56  All other vitals stable  Pt already took her Lasix today  Pt and husband looking for direction on what to do.  Please call Patient.

## 2022-10-09 NOTE — Telephone Encounter (Signed)
   Telephone encounter was:  Successful.  10/09/2022 Name: TRANIECE BOFFA MRN: [id number] DOB: 01-02-1932  Severiano Gilbert Hurlock is a 89 y.o. year old female who is a primary care patient of [name], [name], MD . The community resource team was consulted for assistance with  wheelchair ramp.  Care guide performed the following interventions: Spoke with patient's spouse Theodorus to inform him I received a call from Sharol Given at [employer]. Hinton Dyer requested that  Mr. Gilmartin call her to do an intake to determine eligibility.  Follow Up Plan:  No further follow up planned at this time. The patient has been provided with needed resources.  [place] Resource Care Guide   [email or web address]  ?? [phone]   Website: [email or web address]  [email or web address]

## 2022-10-09 NOTE — Telephone Encounter (Signed)
I spoke with the patient's husband and he was informed of the message below and verbalized understanding. Patient's husband reported that he took the patient blood pressure after Physical therapist left and stated her blood pressure was consistently > 121 systolic with around 80 diastolic. Patents husband also stated her oxygen was 91,92 and 94 on three different occasions while he has taken her vitals.

## 2022-10-11 DIAGNOSIS — S3210XD Unspecified fracture of sacrum, subsequent encounter for fracture with routine healing: Secondary | ICD-10-CM | POA: Diagnosis not present

## 2022-10-11 DIAGNOSIS — I509 Heart failure, unspecified: Secondary | ICD-10-CM | POA: Diagnosis not present

## 2022-10-11 DIAGNOSIS — M19042 Primary osteoarthritis, left hand: Secondary | ICD-10-CM | POA: Diagnosis not present

## 2022-10-11 DIAGNOSIS — Z9181 History of falling: Secondary | ICD-10-CM | POA: Diagnosis not present

## 2022-10-11 DIAGNOSIS — M19041 Primary osteoarthritis, right hand: Secondary | ICD-10-CM | POA: Diagnosis not present

## 2022-10-14 ENCOUNTER — Telehealth: Payer: Self-pay | Admitting: Family Medicine

## 2022-10-14 DIAGNOSIS — S3210XD Unspecified fracture of sacrum, subsequent encounter for fracture with routine healing: Secondary | ICD-10-CM | POA: Diagnosis not present

## 2022-10-14 DIAGNOSIS — M19041 Primary osteoarthritis, right hand: Secondary | ICD-10-CM | POA: Diagnosis not present

## 2022-10-14 DIAGNOSIS — I509 Heart failure, unspecified: Secondary | ICD-10-CM | POA: Diagnosis not present

## 2022-10-14 DIAGNOSIS — Z9181 History of falling: Secondary | ICD-10-CM | POA: Diagnosis not present

## 2022-10-14 DIAGNOSIS — M19042 Primary osteoarthritis, left hand: Secondary | ICD-10-CM | POA: Diagnosis not present

## 2022-10-14 NOTE — Telephone Encounter (Signed)
Patient gained weight between Friday and today. 121.6 today 118 on friday

## 2022-10-15 NOTE — Telephone Encounter (Signed)
I spoke with the patient's husband and he reported the patient's blood pressure has been around 118/50-114/50. Patient's husband was informed of the message below and verbalized understanding.

## 2022-10-17 DIAGNOSIS — I509 Heart failure, unspecified: Secondary | ICD-10-CM | POA: Diagnosis not present

## 2022-10-17 DIAGNOSIS — Z9181 History of falling: Secondary | ICD-10-CM | POA: Diagnosis not present

## 2022-10-17 DIAGNOSIS — M19041 Primary osteoarthritis, right hand: Secondary | ICD-10-CM | POA: Diagnosis not present

## 2022-10-17 DIAGNOSIS — M19042 Primary osteoarthritis, left hand: Secondary | ICD-10-CM | POA: Diagnosis not present

## 2022-10-17 DIAGNOSIS — S3210XD Unspecified fracture of sacrum, subsequent encounter for fracture with routine healing: Secondary | ICD-10-CM | POA: Diagnosis not present

## 2022-10-21 ENCOUNTER — Other Ambulatory Visit: Payer: Self-pay | Admitting: Family Medicine

## 2022-10-21 ENCOUNTER — Telehealth: Payer: Self-pay | Admitting: *Deleted

## 2022-10-21 ENCOUNTER — Encounter: Payer: Self-pay | Admitting: *Deleted

## 2022-10-21 NOTE — Patient Outreach (Signed)
  Care Coordination   Follow Up Visit Note   10/21/2022 Name: KAYLIAH TINDOL MRN: [id number] DOB: [birth year]  Severiano Gilbert Estabrook is a 89 y.o. year old female who sees [name], [name], MD for primary care. I spoke with Mr. Gunner today.   What matters to the patients health and wellness today?  Staying well and out of the hospital    Goals Addressed               This Visit's Progress     Patient Stated     I want to stay out of the hospital. (pt-stated)        Interventions Today    Flowsheet Row Most Recent Value  Chronic Disease Discussed/Reviewed   Chronic disease discussed/reviewed during today's visit Congestive Heart Failure (CHF)  General Interventions   General Interventions Discussed/Reviewed General Interventions Discussed  [Following HF Action Plan. Call early for noted problems!]  Exercise Interventions   Exercise Discussed/Reviewed Physical Activity  [Elevate leg while seated to heart level.]  Physical Activity Discussed/Reviewed Physical Activity Discussed  [Encouraged walking with supervision (using walker)]  Education Interventions   Education Provided Provided Verbal Education  Nutrition Interventions   Nutrition Discussed/Reviewed Decreasing salt  Safety Interventions   Safety Discussed/Reviewed Safety Discussed  [Fall prevention when ambulating with rolling walker.]            Other     Caregiver Strain-LCSW        Interventions Today    Flowsheet Row Most Recent Value  Chronic Disease Discussed/Reviewed   Chronic disease discussed/reviewed during today's visit Other  [Caregiver strain: asked husband to ask his daughter to give him a 4 hour break each week for him to do whatever he needs to do.]                    CCM Expected Outcome:  Monitor, Self-Manage and Reduce Symptoms of:          SDOH assessments and interventions completed:  Yes   Previously assessed.  Care Coordination Interventions:  Yes, provided   Follow  up plan: Follow up call scheduled for 2 weeks.    Encounter Outcome:  Pt. Visit Completed   Kayleen Memos C. Myrtie Neither, MSN, [facility] Gerontological Nurse Practitioner [facility] Care Management [phone]

## 2022-10-23 ENCOUNTER — Other Ambulatory Visit: Payer: Self-pay

## 2022-10-23 MED ORDER — FUROSEMIDE 20 MG PO TABS: 20.0000 mg | ORAL_TABLET | Freq: Every day | ORAL | 0 refills | Status: DC

## 2022-10-25 ENCOUNTER — Ambulatory Visit: Payer: Self-pay | Admitting: Licensed Clinical Social Worker

## 2022-10-28 NOTE — Patient Outreach (Signed)
  Care Coordination   Follow Up Visit Note   10/25/22 Name: Briana Fitzgerald MRN: [id number] DOB: May 02, 1932  Briana Fitzgerald is a 89 y.o. year old female who sees [name], [name], MD for primary care. I spoke with  Briana Gilbert Syverson by phone today.  What matters to the patients health and wellness today?  Caregiver Resources    Goals Addressed             This Visit's Progress    Caregiver Strain-LCSW   On track    Care Coordination Interventions: Solution-Focused Strategies employed:  Active listening / Reflection utilized  Emotional Support Provided Caregiver stress acknowledged  Verbalization of feelings encouraged  LCSW spoke with pt's daughter in law, [place] services ended last week. Pt has been observed "doing okay" Family continues to assist patient and spouse with groceries and transportation to medical appts PT encouraged pt to continue exercises and practice walking on deck with cane, prior to venturing on the road and/or neighborhood.  Family is happy with pt's progress  Patient continues to have motivation to practice exercises. Her goal is to garden in the spring Family is not interested in completing paperwork associated with obtaining a ramp. They will update Care Coordination if they re-visit interest in a ramp                   SDOH assessments and interventions completed:  No     Care Coordination Interventions:  Yes, provided   Follow up plan: Follow up call scheduled for 4-6 weeks    Encounter Outcome:  Pt. Visit Completed   [name], MSW, [email or web address] Phone [phone] 8:39 AM

## 2022-10-28 NOTE — Patient Instructions (Signed)
Visit Information  Thank you for taking time to visit with me today. Please don't hesitate to contact me if I can be of assistance to you.   Following are the goals we discussed today:   Goals Addressed             This Visit's Progress    Caregiver Strain-LCSW   On track    Care Coordination Interventions: Solution-Focused Strategies employed:  Active listening / Reflection utilized  Emotional Support Provided Caregiver stress acknowledged  Verbalization of feelings encouraged  LCSW spoke with pt's daughter in law, [place] services ended last week. Pt has been observed "doing okay" Family continues to assist patient and spouse with groceries and transportation to medical appts PT encouraged pt to continue exercises and practice walking on deck with cane, prior to venturing on the road and/or neighborhood.  Family is happy with pt's progress  Patient continues to have motivation to practice exercises. Her goal is to garden in the spring Family is not interested in completing paperwork associated with obtaining a ramp. They will update Care Coordination if they re-visit interest in a ramp                   Our next appointment is by telephone on 11/22/22 at 11 AM  Please call the care guide team at [phone] if you need to cancel or reschedule your appointment.   If you are experiencing a Mental Health or [place] or need someone to talk to, please call the Suicide and Crisis Lifeline: 988 call 911   Patient verbalizes understanding of instructions and care plan provided today and agrees to view in [place]. Active MyChart status and patient understanding of how to access instructions and care plan via MyChart confirmed with patient.     [name], MSW, [email or web address] Phone [phone] 8:39 AM

## 2022-11-04 ENCOUNTER — Telehealth: Payer: Self-pay | Admitting: *Deleted

## 2022-11-04 ENCOUNTER — Encounter: Payer: Self-pay | Admitting: *Deleted

## 2022-11-04 NOTE — Patient Outreach (Signed)
  Care Coordination   Follow Up Visit Note   11/04/2022 Name: Briana Fitzgerald MRN: [id number] DOB: [birth year]  Severiano Gilbert Nevills is a 89 y.o. year old female who sees [name], [name], MD for primary care. I spoke with  Mr. Colee by phone today.  What matters to the patients health and wellness today?  Continue to manage health without complications.    Goals Addressed               This Visit's Progress     Patient Stated     COMPLETED: I want to stay out of the hospital. (pt-stated)        Interventions Today    Flowsheet Row Most Recent Value  Chronic Disease   Chronic disease during today's visit Congestive Heart Failure (CHF)  General Interventions   General Interventions Discussed/Reviewed General Interventions Discussed  [Pt is following her HF Action plan with the help of her husband and daughter-in-law. They are failthrul in doing so. No exacerbations.]  Exercise Interventions   Exercise Discussed/Reviewed Physical Activity  Physical Activity Discussed/Reviewed Physical Activity Discussed  [Pt is getting around well with her walker. PT has signed off.]  Education Interventions   Education Provided Provided Education  Provided Verbal Education On Medication, When to see the doctor  Durward Fortes husband to hold pt lasix one day if her blood pressure is <100. Resume next day. This is a precaution to prevent falls.  Report increased wt beyond her usualy range of 116-120, increased SOB for instructions.]  Safety Interventions   Safety Discussed/Reviewed Safety Reviewed  [No falls or other household mishaps.]              SDOH assessments and interventions completed:  Yes   PREVIOUSLY ASSESSED  Care Coordination Interventions:  Yes, provided   Follow up plan: No further intervention required.  Discussed case closure. Patient  may outreach [facility] any time in the future for assistance.  Encounter Outcome:  Pt. Visit Completed   Kayleen Memos C. Myrtie Neither, MSN,  [facility] Gerontological Nurse Practitioner [facility] Care Management [phone]

## 2022-11-21 NOTE — Telephone Encounter (Signed)
Close encounter 

## 2022-11-22 ENCOUNTER — Ambulatory Visit: Payer: Self-pay | Admitting: Licensed Clinical Social Worker

## 2022-11-22 NOTE — Patient Outreach (Signed)
  Care Coordination   Follow Up Visit Note   11/22/2022 Name: Briana Fitzgerald MRN: [id number] DOB: 1932/05/09  Briana Fitzgerald Batch is a 89 y.o. year old female who sees Briana Fitzgerald, Briana Sierras, MD for primary care. I spoke with  Briana Fitzgerald's DIL by phone today.  What matters to the patients health and wellness today?  Caregiver Resources    Goals Addressed             This Visit's Progress    COMPLETED: Caregiver Strain-LCSW   On track    Activities and task to complete in order to accomplish goals.   Keep all upcoming appointment discussed today Continue with compliance of taking medication prescribed by Doctor Continue implementing exercises from OT/PT to strengthen mobility Follow up with community resources for ramp installment, as needed                    SDOH assessments and interventions completed:  No     Care Coordination Interventions:  Yes, provided  Interventions Today    Flowsheet Row Most Recent Value  Chronic Disease   Chronic disease during today's visit Other  [Diastolic dysfunction wiht heart failure]  General Interventions   General Interventions Discussed/Reviewed General Interventions Reviewed, Community Resources  Exercise Interventions   Exercise Discussed/Reviewed Physical Activity, Exercise Reviewed  Mental Health Interventions   Mental Health Discussed/Reviewed Anxiety  [Pt is not exhibitting any current depression or anxiety symptoms]  Safety Interventions   Safety Discussed/Reviewed Fall Risk  [LCSW discussed strategies to decrease fall risk while ambulating in the home]       Follow up plan: No further intervention required.   Encounter Outcome:  Pt. Visit Completed   Briana Fitzgerald, MSW, [email or web address] Phone [phone] 12:53 PM

## 2022-11-22 NOTE — Patient Instructions (Signed)
Visit Information  Thank you for taking time to visit with me today. Please don't hesitate to contact me if I can be of assistance to you.   Following are the goals we discussed today:   Goals Addressed             This Visit's Progress    COMPLETED: Caregiver Strain-LCSW   On track    Activities and task to complete in order to accomplish goals.   Keep all upcoming appointment discussed today Continue with compliance of taking medication prescribed by Doctor Continue implementing exercises from OT/PT to strengthen mobility Follow up with community resources for ramp installment, as needed                    If you are experiencing a Mental Health or [place] or need someone to talk to, please call the Suicide and Crisis Lifeline: 988 call 911   Patient verbalizes understanding of instructions and care plan provided today and agrees to view in [place]. Active MyChart status and patient understanding of how to access instructions and care plan via MyChart confirmed with patient.     No further follow up required: Pt completed goals associated with LCSW  Briana Fitzgerald, MSW, [email or web address] Phone [phone] 12:54 PM

## 2022-11-26 ENCOUNTER — Ambulatory Visit (INDEPENDENT_AMBULATORY_CARE_PROVIDER_SITE_OTHER): Payer: Medicare Other

## 2022-11-26 ENCOUNTER — Ambulatory Visit: Payer: Self-pay

## 2022-11-26 ENCOUNTER — Ambulatory Visit (INDEPENDENT_AMBULATORY_CARE_PROVIDER_SITE_OTHER): Payer: Medicare Other | Admitting: Family Medicine

## 2022-11-26 VITALS — BP 150/72 | HR 76 | Ht 63.5 in

## 2022-11-26 DIAGNOSIS — G8929 Other chronic pain: Secondary | ICD-10-CM

## 2022-11-26 DIAGNOSIS — M19012 Primary osteoarthritis, left shoulder: Secondary | ICD-10-CM | POA: Diagnosis not present

## 2022-11-26 DIAGNOSIS — M25512 Pain in left shoulder: Secondary | ICD-10-CM | POA: Diagnosis not present

## 2022-11-26 DIAGNOSIS — M545 Low back pain, unspecified: Secondary | ICD-10-CM

## 2022-11-26 DIAGNOSIS — M5442 Lumbago with sciatica, left side: Secondary | ICD-10-CM | POA: Diagnosis not present

## 2022-11-26 DIAGNOSIS — M5440 Lumbago with sciatica, unspecified side: Secondary | ICD-10-CM | POA: Diagnosis not present

## 2022-11-26 NOTE — Patient Instructions (Addendum)
Thank you for coming in today.   Please get an Xray today before you leave   You received an injection today. Seek immediate medical attention if the joint becomes red, extremely painful, or is oozing fluid.   Please call [facility] at [phone] to schedule your spine injection.    If the shot in your shoulder doesn't help, I can inject the other part of your shoulder as soon as 1 week.  Otherwise, check back in 1 month.

## 2022-11-26 NOTE — Progress Notes (Signed)
Briana Payor, PhD, LAT, ATC acting as a scribe for Briana Graham, MD.  Briana Fitzgerald is a 89 y.o. female who presents to [facility] today for f/u LBP, hip and leg pain. Pt was last seen by Dr. Denyse Amass on 09/12/22 for bilat hand pain. Pt had a virtual visit w/ Dr. Denyse Amass on 08/28/22 for f/u of her sacral insufficieny fx and her oxycodone was increased to 7.5-10mg  and was advised to use her walker. Today, pt reports increased pain first thing in the morning. Pt locates pain to the L-side of her low back w/ radiating pain along the posterior aspect of her L leg. Pt also c/o L shoulder pain.   Dx imaging: 08/23/22 L hip & L-spine MRI and pelvis XR             04/22/22 L-spine XR             04/02/20 L-spine MRI             02/15/20 L-spine XR  Pertinent review of systems: No fevers or chills  Relevant historical information: History of osteoporosis and a sacral insufficiency fracture.   Exam:  BP (!) 150/72   Pulse 76   Ht 5' 3.5" (1.613 m)   SpO2 96%   BMI 21.10 kg/m  General: Well Developed, well nourished, and in no acute distress.   MSK: Left shoulder: Normal-appearing Decreased range of motion.  Abduction limited to 120 degrees.  External rotation full.  Internal rotation lumbar spine. Strength abduction 4/5 external rotation 4/5.  Internal rotation 5/5. Positive Hawkins and Neer's test.  Negative Yergason's and speeds test.  L-spine: Normal appearing Nontender palpation spinal midline. Decreased lumbar motion. Lower extremity strength is intact. Positive slump test. Reflexes are intact.     Lab and Radiology Results  Procedure: Real-time Ultrasound Guided Injection of left shoulder glenohumeral joint posterior approach Device: Philips Affiniti 50G Images permanently stored and available for review in PACS Verbal informed consent obtained.  Discussed risks and benefits of procedure. Warned about infection, bleeding, hyperglycemia damage  to structures among others. Patient expresses understanding and agreement Time-out conducted.   Noted no overlying erythema, induration, or other signs of local infection.   Skin prepped in a sterile fashion.   Local anesthesia: Topical Ethyl chloride.   With sterile technique and under real time ultrasound guidance: 40 mg of Kenalog and 2 mL Marcaine injected into glenohumeral joint. Fluid seen entering the joint capsule.   Completed without difficulty   Pain immediately resolved suggesting accurate placement of the medication.   Advised to call if fevers/chills, erythema, induration, drainage, or persistent bleeding.   Images permanently stored and available for review in the ultrasound unit.  Impression: Technically successful ultrasound guided injection.   X-ray images left shoulder and lumbar spine obtained today personally and independently interpreted  Left shoulder: Intact hardware from shoulder fracture.  Mild glenohumeral DJD.  No acute fractures are visible.  L-spine: DDD L5-S1.  Anterolisthesis L4-L5.  DDD T12-L1.  Await formal radiology review   EXAM: MRI LUMBAR SPINE WITHOUT CONTRAST   TECHNIQUE: Multiplanar, multisequence MR imaging of the lumbar spine was performed. No intravenous contrast was administered.   COMPARISON:  Lumbar radiographs 04/22/2022.  MRI 04/02/2020.   FINDINGS: Segmentation: Normal on the comparison radiographs. This is the same numbering system used on the 2021 MRI.   Alignment: Stable since 2021. Chronic retrolisthesis of T12 on L1, anterolisthesis of L3 on L4 (6-7 mm) and  mild underlying levoconvex lumbar scoliosis.   Vertebrae: Chronic disc space loss and ankylosis at T12-L1, and similar appearance at L4-L5. Scattered benign vertebral hemangiomas. No marrow edema or evidence of acute osseous abnormality. In the visible lower thoracic or lumbar levels. However, there is confluent marrow edema throughout the visible bilateral sacral  ala (series 9, image 35) and tracking into the central sacrum where comminuted but minimally displaced fractures of the central S2 vertebra are visible on series 5, image 7 and series 6, image 7.   Conus medullaris and cauda equina: Conus extends to the L1 level. No lower spinal cord or conus signal abnormality.   Paraspinal and other soft tissues: Stable since 2021. Chronic postoperative changes to the posterior lower lumbar paraspinal soft tissues. Visible abdominal viscera appear stable and negative for age.   Disc levels:   Unchanged compared to the 2021 MRI. Multifactorial mild spinal stenosis just above the conus at T12-L1 (chronic ankylosis there). Moderate multifactorial spinal stenosis at L2-L3. And previous posterior decompression from L3 through L5.   IMPRESSION: 1. Acute to subacute bilateral sacral ala and central S2 sacral fractures. These could be posttraumatic and/or insufficiency related.   2. Stable lower thoracic and lumbar spine since 2021 MRI. Chronic ankylosis of T12-L1 and L4-L5. Chronic degenerative and postoperative changes with residual spinal stenosis most pronounced at L2-L3.     Electronically Signed   By: Odessa Fleming M.D.   On: 08/23/2022 08:12  I, Briana Fitzgerald, personally (independently) visualized and performed the interpretation of the images attached in this note.   Assessment and Plan: 89 y.o. female with left shoulder pain.  This is an acute exacerbation of a chronic problem.  Pain could be either related to the glenohumeral degenerative changes or rotator cuff impingement.  Plan for trial glenohumeral injection and recheck in 1 month.  If this does not help at all would consider subacromial injection sooner.  The pain radiating down the left leg is thought to be lumbar radiculopathy at left L5.  She has potential for impingement multiple areas in her spine on MRI from December 2023.  Plan for trial of epidural steroid injection likely targeting  the left L5 nerve root.  Recheck in 1 month.   PDMP not reviewed this encounter. Orders Placed This Encounter  Procedures   [place] LIMITED JOINT SPACE STRUCTURES UP LEFT(NO LINKED CHARGES)    Order Specific Question:   Reason for Exam (SYMPTOM  OR DIAGNOSIS REQUIRED)    Answer:   left shoulder pain    Order Specific Question:   Preferred imaging location?    Answer:   [facility] DIAG/THERA/INC NEEDLE/CATH/PLC EPI/LUMB/SAC W/IMG    Level and technique per radiology, w/ L5 radicular symptoms    Standing Status:   Future    Standing Expiration Date:   12/27/2022    Order Specific Question:   Reason for Exam (SYMPTOM  OR DIAGNOSIS REQUIRED)    Answer:   Low back pain    Order Specific Question:   Preferred Imaging Location?    Answer:   [facility]   DG Lumbar Spine 2-3 Views    Standing Status:   Future    Number of Occurrences:   1    Standing Expiration Date:   12/27/2022    Order Specific Question:   Reason for Exam (SYMPTOM  OR DIAGNOSIS REQUIRED)    Answer:   low back pain    Order Specific Question:  Preferred imaging location?    Answer:   [facility]   DG Shoulder Left    Standing Status:   Future    Number of Occurrences:   1    Standing Expiration Date:   12/27/2022    Order Specific Question:   Reason for Exam (SYMPTOM  OR DIAGNOSIS REQUIRED)    Answer:   left shoulder pain    Order Specific Question:   Preferred imaging location?    Answer:   [facility]   No orders of the defined types were placed in this encounter.    Discussed warning signs or symptoms. Please see discharge instructions. Patient expresses understanding.   The above documentation has been reviewed and is accurate and complete Briana Fitzgerald, M.D.

## 2022-11-29 NOTE — Progress Notes (Signed)
Left shoulder x-ray shows some mild arthritis changes.  Evidence of calcific tendinitis is present.

## 2022-12-01 NOTE — Progress Notes (Signed)
Lumbar spine x-ray shows no fractures.  Back arthritis is present and significant but unchanged from prior lumbar spine MRI.

## 2022-12-05 ENCOUNTER — Ambulatory Visit
Admission: RE | Admit: 2022-12-05 | Discharge: 2022-12-05 | Disposition: A | Discharge status: Home / Self Care | Payer: Medicare Other | Source: Ambulatory Visit | Attending: Family Medicine | Admitting: Family Medicine

## 2022-12-05 DIAGNOSIS — M545 Low back pain, unspecified: Secondary | ICD-10-CM | POA: Diagnosis not present

## 2022-12-05 DIAGNOSIS — G8929 Other chronic pain: Secondary | ICD-10-CM

## 2022-12-05 MED ORDER — IOPAMIDOL (ISOVUE-M 200) INJECTION 41%
1.0000 mL | Freq: Once | INTRAMUSCULAR | Status: AC
  Administered 2022-12-05: 1 mL via EPIDURAL

## 2022-12-05 MED ORDER — METHYLPREDNISOLONE ACETATE 40 MG/ML INJ SUSP (RADIOLOG
80.0000 mg | Freq: Once | INTRAMUSCULAR | Status: AC
  Administered 2022-12-05: 80 mg via EPIDURAL

## 2022-12-05 NOTE — Discharge Instructions (Signed)

## 2022-12-08 ENCOUNTER — Other Ambulatory Visit: Payer: Self-pay | Admitting: Family Medicine

## 2022-12-11 ENCOUNTER — Other Ambulatory Visit: Payer: Medicare Other

## 2022-12-16 ENCOUNTER — Ambulatory Visit (INDEPENDENT_AMBULATORY_CARE_PROVIDER_SITE_OTHER): Payer: Medicare Other | Admitting: Family Medicine

## 2022-12-16 ENCOUNTER — Encounter: Payer: Self-pay | Admitting: Family Medicine

## 2022-12-16 VITALS — BP 114/58 | HR 85 | Temp 98.8°F | Wt 115.0 lb

## 2022-12-16 DIAGNOSIS — M1991 Primary osteoarthritis, unspecified site: Secondary | ICD-10-CM

## 2022-12-16 DIAGNOSIS — K056 Periodontal disease, unspecified: Secondary | ICD-10-CM

## 2022-12-16 MED ORDER — LIDOCAINE 5 % EX PTCH: 1.0000 | MEDICATED_PATCH | CUTANEOUS | 2 refills | Status: DC

## 2022-12-16 MED ORDER — NYSTATIN 100000 UNIT/ML MT SUSP: 5.0000 mL | Freq: Four times a day (QID) | OROMUCOSAL | 2 refills | Status: DC

## 2022-12-16 MED ORDER — FUROSEMIDE 20 MG PO TABS: 40.0000 mg | ORAL_TABLET | Freq: Every day | ORAL | 0 refills | Status: DC

## 2022-12-16 NOTE — Progress Notes (Signed)
   Subjective:    Patient ID: Briana Fitzgerald, female    DOB: 08/07/1932, 89 y.o.   MRN: [id number]  HPI Here with her husband for several issues. First she began to have irritation on her gums about 3 weeks ago. She got new dentures about 6 months ago. She has started taking the dentures out at night, but this has not helped with the tenderness. Also she asks for a RX for Lidoderm patches. She has OA, and she has been trying some of these from a friend on her shoulders, and they have been very effective.    Review of Systems  Constitutional: Negative.   HENT:  Positive for mouth sores. Negative for sore throat and trouble swallowing.   Respiratory: Negative.    Cardiovascular: Negative.   Musculoskeletal:  Positive for arthralgias.       Objective:   Physical Exam Constitutional:      Appearance: Normal appearance.  HENT:     Mouth/Throat:     Comments: The upper and lower gums are red and they appear to be irritated. No ulcers or lesions are seen.  Cardiovascular:     Rate and Rhythm: Normal rate and regular rhythm.     Pulses: Normal pulses.     Heart sounds: Normal heart sounds.  Pulmonary:     Effort: Pulmonary effort is normal.     Breath sounds: Normal breath sounds.  Lymphadenopathy:     Cervical: No cervical adenopathy.  Neurological:     Mental Status: She is alert.           Assessment & Plan:  She has gum irritation, likely from poorly fitting dentures. She will try some Nystatin suspension rinses, and I advised her to see her dentist again about this. For the OA, we will provide her with her own Lidoderm patches.  Alysia Penna, MD

## 2022-12-17 ENCOUNTER — Other Ambulatory Visit: Payer: Self-pay | Admitting: Family Medicine

## 2022-12-25 ENCOUNTER — Other Ambulatory Visit (HOSPITAL_COMMUNITY): Payer: Self-pay

## 2022-12-25 ENCOUNTER — Telehealth: Payer: Self-pay

## 2022-12-25 NOTE — Telephone Encounter (Signed)
Patient Advocate Encounter   Received notification from [employer] that prior authorization is required for Lidocaine 5% patches   Submitted: 12-25-2022 Key [id number]  Status is pending

## 2022-12-26 NOTE — Telephone Encounter (Signed)
Patient Advocate Encounter  Received a fax from [employer] regarding Prior Authorization for Lidocaine 5% patches.   Authorization has been DENIED due to        Determination letter attached to patient chart

## 2022-12-30 NOTE — Telephone Encounter (Signed)
I understand. I suppose she can purchase these out of pocket, but I do not know how expensive they are

## 2022-12-31 NOTE — Telephone Encounter (Signed)
Spoke with the patient's husband and informed him of the message below.  Briana Fitzgerald stated he already picked up the Rx and is aware the insurance will not cover the total cost of the Rx.

## 2023-02-26 ENCOUNTER — Ambulatory Visit (INDEPENDENT_AMBULATORY_CARE_PROVIDER_SITE_OTHER): Payer: Medicare Other | Admitting: Family Medicine

## 2023-02-26 VITALS — BP 128/64 | HR 80 | Temp 98.0°F | Ht 63.5 in | Wt 122.1 lb

## 2023-02-26 DIAGNOSIS — M545 Low back pain, unspecified: Secondary | ICD-10-CM

## 2023-02-26 DIAGNOSIS — S81802A Unspecified open wound, left lower leg, initial encounter: Secondary | ICD-10-CM | POA: Diagnosis not present

## 2023-02-26 DIAGNOSIS — G8929 Other chronic pain: Secondary | ICD-10-CM

## 2023-02-26 DIAGNOSIS — R600 Localized edema: Secondary | ICD-10-CM

## 2023-02-26 MED ORDER — LIDOCAINE 5 % EX PTCH: 1.0000 | MEDICATED_PATCH | CUTANEOUS | 2 refills | Status: DC

## 2023-02-26 MED ORDER — TORSEMIDE 20 MG PO TABS: 20.0000 mg | ORAL_TABLET | Freq: Every day | ORAL | 2 refills | Status: DC

## 2023-02-26 NOTE — Progress Notes (Signed)
Established Patient Office Visit  Subjective   Patient ID: Briana Fitzgerald, female    DOB: 03-Dec-1931  Age: 89 y.o. MRN: [id number]  Chief Complaint  Patient presents with   Follow-up    Pt with spouse states they are follow up back and neck pain. Want to let provider know that its not going well as they thought.   unhealed sore    Briana Fitzgerald reports unhealed sore on R lower leg.    Edema    On both legs.    Incontinent Problem    HPI   Briana Fitzgerald is seen today for the following items  Bilateral leg edema.  This seems to have worsened somewhat recently.  Since her last visit here she has gained 7 pounds.  Denies orthopnea.  Currently on Lasix 20 mg 2 tablets daily but does not seem to be helping much.  She tries to elevate her legs some.  She had echocardiogram back around 2019 which showed diastolic dysfunction but good systolic function. Previous albumin levels been normal.  She states her diet is "good ".  Very sedentary.  No dyspnea at rest.  She had recent fall with wound on the right leg.  This happened a couple weeks ago.  Slow to heal.  Cleaning with soap and water.  She has been applying some Neosporin.  She has multiple chronic musculoskeletal complaints.  She has chronic back pain which limits her activity very much.  Mostly ambulates with a walker.  She was recently prescribed Lidoderm patches which have helped some and she would like a refill on that.  Past Medical History:  Diagnosis Date   Arthritis    BACK PAIN 07/18/2008   Bowel obstruction (HCC)    CONSTIPATION 10/02/2010   GERD 10/02/2010   Headache    IBS (irritable bowel syndrome)    LAMINECTOMY, LUMBAR, HX OF 06/16/2008   OSTEOARTHRITIS 10/02/2010   Pneumonia    RESTLESS LEG SYNDROME 10/02/2010   SACROILIAC JOINT DYSFUNCTION 06/16/2008   Sleep apnea    STYE 10/02/2010   TRANSIENT ISCHEMIC ATTACKS, HX OF 10/02/2010   Past Surgical History:  Procedure Laterality Date   ABDOMINAL HYSTERECTOMY  1979   TAH,  fibroids   APOGEE / PERIGEE REPAIR  2005   APPENDECTOMY  1941   EYE SURGERY  2005   catarac,both eyes   LIVER BIOPSY  1997   benign, hemangioma resection   SPINE SURGERY  2006   laminectomy    reports that she has never smoked. She has never used smokeless tobacco. She reports current alcohol use. She reports that she does not use drugs. family history includes Breast cancer in her paternal aunt; Diabetes in her father; Pancreatitis in her brother; Stroke in her father. Allergies  Allergen Reactions   Penicillins Hives, Rash and Other (See Comments)    Blister with PCN    Review of Systems  Constitutional:  Positive for malaise/fatigue. Negative for chills and fever.  Eyes:  Negative for blurred vision.  Respiratory:  Negative for shortness of breath.   Cardiovascular:  Positive for leg swelling. Negative for chest pain and PND.  Genitourinary:  Negative for dysuria.  Musculoskeletal:  Positive for back pain and neck pain.  Neurological:  Negative for dizziness, weakness and headaches.      Objective:     BP 128/64 (BP Location: Right Arm, Patient Position: Sitting, Cuff Size: Normal)   Pulse 80   Temp 98 F (36.7 C) (Oral)   Ht 5'  3.5" (1.613 m)   Wt 122 lb 1.6 oz (55.4 kg)   SpO2 95%   BMI 21.29 kg/m  BP Readings from Last 3 Encounters:  02/26/23 128/64  12/16/22 (!) 114/58  12/05/22 138/63   Wt Readings from Last 3 Encounters:  02/26/23 122 lb 1.6 oz (55.4 kg)  12/16/22 115 lb (52.2 kg)  08/28/22 121 lb (54.9 kg)      Physical Exam Vitals reviewed.  Constitutional:      General: She is not in acute distress. Cardiovascular:     Rate and Rhythm: Normal rate and regular rhythm.  Pulmonary:     Effort: Pulmonary effort is normal.     Breath sounds: Normal breath sounds. No wheezing or rales.  Musculoskeletal:     Right lower leg: Edema present.     Left lower leg: Edema present.     Comments: She has 1+ pitting edema legs bilaterally.  Skin:     Comments: Right anterior leg reveals evulsion type abrasion of the lower leg.  No signs of cellulitis.  Area of abrasion is approximately 2 x 2 cm  Neurological:     Mental Status: She is alert.      No results found for any visits on 02/26/23.    The ASCVD Risk score (Arnett DK, et al., 2019) failed to calculate for the following reasons:   The 2019 ASCVD risk score is only valid for ages 93 to 1    Assessment & Plan:   #1 bilateral leg edema.  Probably related to venous stasis and diastolic dysfunction predominantly.  She has been battling this really for years.  Previous echo showed normal systolic function but diastolic dysfunction. -She is not responding well currently to Lasix 40 mg daily.  Will try switching to torsemide 20 mg once daily -Set up follow-up in 1 week and check further labs then including CMP -Elevate legs frequently  #2 wound right anterior leg.  We discussed the fact that her edema is probably contributing to her poor healing.  She does not have any history of known diabetes.  She has recently been applying Neosporin.  We recommend gentle cleaning daily with soap and water and air dry.  Leave off Neosporin.  Consider small amount of Vaseline.  Reassess in 1 week.  May need some light compression to get healed  #3 chronic back pain.  Refill lidoderm patch which she uses 12 hours on and 12 hours off.      Evelena Peat, MD

## 2023-02-26 NOTE — Patient Instructions (Signed)
Stop the Furosemide  Start Torsemide 20 mg once daily  Clean leg wound gently daily with soap and water  Apply thin layer of vaseline to wound daily.

## 2023-03-09 ENCOUNTER — Other Ambulatory Visit: Payer: Self-pay | Admitting: Family Medicine

## 2023-03-14 ENCOUNTER — Ambulatory Visit (INDEPENDENT_AMBULATORY_CARE_PROVIDER_SITE_OTHER): Payer: Medicare Other | Admitting: Family Medicine

## 2023-03-14 ENCOUNTER — Encounter: Payer: Self-pay | Admitting: Family Medicine

## 2023-03-14 VITALS — BP 114/60 | HR 80 | Temp 98.1°F | Ht 63.5 in | Wt 123.1 lb

## 2023-03-14 DIAGNOSIS — R600 Localized edema: Secondary | ICD-10-CM

## 2023-03-14 LAB — COMPREHENSIVE METABOLIC PANEL
ALT: 10 U/L (ref 0–35)
AST: 17 U/L (ref 0–37)
Albumin: 3.9 g/dL (ref 3.5–5.2)
Alkaline Phosphatase: 88 U/L (ref 39–117)
BUN: 21 mg/dL (ref 6–23)
CO2: 34 mEq/L — ABNORMAL HIGH (ref 19–32)
Calcium: 10.9 mg/dL — ABNORMAL HIGH (ref 8.4–10.5)
Chloride: 104 mEq/L (ref 96–112)
Creatinine, Ser: 0.82 mg/dL (ref 0.40–1.20)
GFR: 63.54 mL/min (ref >60.00)
Glucose, Bld: 106 mg/dL — ABNORMAL HIGH (ref 70–99)
Potassium: 3.7 mEq/L (ref 3.5–5.1)
Sodium: 143 mEq/L (ref 135–145)
Total Bilirubin: 1 mg/dL (ref 0.2–1.2)
Total Protein: 6.3 g/dL (ref 6.0–8.3)

## 2023-03-14 MED ORDER — TORSEMIDE 40 MG PO TABS: 40.0000 mg | ORAL_TABLET | Freq: Every day | ORAL | 5 refills | Status: DC

## 2023-03-14 MED ORDER — POTASSIUM CHLORIDE CRYS ER 20 MEQ PO TBCR: 20.0000 meq | EXTENDED_RELEASE_TABLET | Freq: Every day | ORAL | 5 refills | Status: DC

## 2023-03-14 NOTE — Progress Notes (Unsigned)
Established Patient Office Visit  Subjective   Patient ID: Briana Fitzgerald, female    DOB: 1932-01-08  Age: 89 y.o. MRN: [id number]  Chief Complaint  Patient presents with   Medical Management of Chronic Issues    HPI  {History (Optional):23778} [name] is here accompanied by her husband.  Refer to last visit for details.  She had significant bilateral leg edema and we switched her from furosemide to torsemide 20 mg once daily.  Her weight is essentially the same and they have not seen much change in terms of her edema.  This is about the same.  No orthopnea.  She is very sedentary at baseline.  Our plan was to reassess her edema today and check follow-up chemistries. No history of any known systolic dysfunction.  She states she is eating fairly well.  Past Medical History:  Diagnosis Date   Arthritis    BACK PAIN 07/18/2008   Bowel obstruction (HCC)    CONSTIPATION 10/02/2010   GERD 10/02/2010   Headache    IBS (irritable bowel syndrome)    LAMINECTOMY, LUMBAR, HX OF 06/16/2008   OSTEOARTHRITIS 10/02/2010   Pneumonia    RESTLESS LEG SYNDROME 10/02/2010   SACROILIAC JOINT DYSFUNCTION 06/16/2008   Sleep apnea    STYE 10/02/2010   TRANSIENT ISCHEMIC ATTACKS, HX OF 10/02/2010   Past Surgical History:  Procedure Laterality Date   ABDOMINAL HYSTERECTOMY  1979   TAH, fibroids   APOGEE / PERIGEE REPAIR  2005   APPENDECTOMY  1941   EYE SURGERY  2005   catarac,both eyes   LIVER BIOPSY  1997   benign, hemangioma resection   SPINE SURGERY  2006   laminectomy    reports that she has never smoked. She has never used smokeless tobacco. She reports current alcohol use. She reports that she does not use drugs. family history includes Breast cancer in her paternal aunt; Diabetes in her father; Pancreatitis in her brother; Stroke in her father. Allergies  Allergen Reactions   Penicillins Hives, Rash and Other (See Comments)    Blister with PCN    Review of Systems   Constitutional:  Positive for malaise/fatigue. Negative for chills and fever.  Cardiovascular:  Positive for leg swelling. Negative for chest pain and orthopnea.  Genitourinary:  Negative for dysuria.      Objective:     BP 114/60 (BP Location: Left Arm, Patient Position: Sitting, Cuff Size: Normal)   Pulse 80   Temp 98.1 F (36.7 C) (Oral)   Ht 5' 3.5" (1.613 m)   Wt 123 lb 1.6 oz (55.8 kg)   SpO2 97%   BMI 21.46 kg/m  {Vitals History (Optional):23777}  Physical Exam Vitals reviewed.  Constitutional:      General: She is not in acute distress.    Appearance: She is not toxic-appearing.  Cardiovascular:     Rate and Rhythm: Normal rate and regular rhythm.  Pulmonary:     Effort: Pulmonary effort is normal.     Breath sounds: Normal breath sounds. No wheezing or rales.  Musculoskeletal:     Comments: She has trace pitting edema lower legs feet and ankles bilaterally.  Neurological:     Mental Status: She is alert.      No results found for any visits on 03/14/23.  {Labs (Optional):23779}  The ASCVD Risk score (Arnett DK, et al., 2019) failed to calculate for the following reasons:   The 2019 ASCVD risk score is only valid for ages 77 to  79    Assessment & Plan:   Problem List Items Addressed This Visit   None Visit Diagnoses     Bilateral edema of lower extremity    -  Primary   Relevant Orders   CMP     Persistent edema lower legs not much change on torsemide 20 mg daily.  Suspect predominantly secondary to venous stasis and diastolic dysfunction. -Elevate legs frequently -Increase torsemide to 40 mg once daily -Check comprehensive metabolic panel today. -Watch sodium intake -Continue compression stockings which they are using during the day -Set up follow-up in 2 to 3 weeks to reassess  Return in about 3 weeks (around 04/04/2023).    Evelena Peat, MD

## 2023-03-14 NOTE — Patient Instructions (Signed)
Increase the Torsemide to 40 mg daily  Start the potassium supplement  Elevate legs frequently.

## 2023-03-28 ENCOUNTER — Ambulatory Visit (INDEPENDENT_AMBULATORY_CARE_PROVIDER_SITE_OTHER): Payer: Medicare Other | Admitting: Family Medicine

## 2023-03-28 ENCOUNTER — Encounter: Payer: Self-pay | Admitting: Family Medicine

## 2023-03-28 VITALS — BP 140/60 | HR 82 | Temp 98.0°F | Ht 63.5 in | Wt 121.0 lb

## 2023-03-28 DIAGNOSIS — R300 Dysuria: Secondary | ICD-10-CM

## 2023-03-28 DIAGNOSIS — Z8669 Personal history of other diseases of the nervous system and sense organs: Secondary | ICD-10-CM | POA: Diagnosis not present

## 2023-03-28 DIAGNOSIS — R600 Localized edema: Secondary | ICD-10-CM | POA: Diagnosis not present

## 2023-03-28 LAB — POC URINALSYSI DIPSTICK (AUTOMATED)
Bilirubin, UA: NEGATIVE
Glucose, UA: NEGATIVE
Ketones, UA: NEGATIVE
Leukocytes, UA: NEGATIVE
Nitrite, UA: NEGATIVE
Protein, UA: NEGATIVE
Spec Grav, UA: 1.01 (ref 1.010–1.025)
Urobilinogen, UA: 0.2 E.U./dL
pH, UA: 6.5 (ref 5.0–8.0)

## 2023-03-28 MED ORDER — UBRELVY 50 MG PO TABS: ORAL_TABLET | ORAL | 5 refills | Status: DC

## 2023-03-28 MED ORDER — TORSEMIDE 20 MG PO TABS: ORAL_TABLET | ORAL | 3 refills | Status: DC

## 2023-03-28 NOTE — Progress Notes (Signed)
Established Patient Office Visit  Subjective   Patient ID: Briana Fitzgerald, female    DOB: Mar 11, 1932  Age: 89 y.o. MRN: [id number]  Chief Complaint  Patient presents with   Edema    Patient states the swelling bilateral LE is a little better   Medication Problem    Patient's husband states Torsemide 40mg  cost $100 for a 90-day supply as this only comes in the brand, requests Rx for generic Torsemide 20mg  to take 2 pills if she is to continue    HPI   Briana Fitzgerald is seen today accompanied by her husband for follow-up regarding bilateral leg edema.  Refer to prior to notes for details.  She had bilateral leg edema for some time suspected related to venous stasis and diastolic dysfunction.  Previous echo showed normal systolic function but diastolic dysfunction.  She been on furosemide initially without much improvement at 40 mg daily and we switched to torsemide 20 mg daily initially without improvement and then subsequently 40 mg daily and they have seen some improvement with that.  Her weight is down a couple pounds.  Still has some late day edema but improved with elevation.  She is also on potassium supplement.  Recent electrolytes stable.  They state that she has some thick mucus in her throat sometimes early in the morning and has difficulty getting this up at times.  She also complains of "dark urine".  She states has been somewhat brown in color past several days.  No burning with urination.  Recent labs did reveal mildly elevated calcium level.  No thiazides.  She has history of migraine headaches and requesting refill of Ubrelvy.  She takes this infrequently.  Past Medical History:  Diagnosis Date   Arthritis    BACK PAIN 07/18/2008   Bowel obstruction (HCC)    CONSTIPATION 10/02/2010   GERD 10/02/2010   Headache    IBS (irritable bowel syndrome)    LAMINECTOMY, LUMBAR, HX OF 06/16/2008   OSTEOARTHRITIS 10/02/2010   Pneumonia    RESTLESS LEG SYNDROME 10/02/2010    SACROILIAC JOINT DYSFUNCTION 06/16/2008   Sleep apnea    STYE 10/02/2010   TRANSIENT ISCHEMIC ATTACKS, HX OF 10/02/2010   Past Surgical History:  Procedure Laterality Date   ABDOMINAL HYSTERECTOMY  1979   TAH, fibroids   APOGEE / PERIGEE REPAIR  2005   APPENDECTOMY  1941   EYE SURGERY  2005   catarac,both eyes   LIVER BIOPSY  1997   benign, hemangioma resection   SPINE SURGERY  2006   laminectomy    reports that she has never smoked. She has never used smokeless tobacco. She reports current alcohol use. She reports that she does not use drugs. family history includes Breast cancer in her paternal aunt; Diabetes in her father; Pancreatitis in her brother; Stroke in her father. Allergies  Allergen Reactions   Penicillins Hives, Rash and Other (See Comments)    Blister with PCN    Review of Systems  Constitutional:  Negative for chills and fever.  Eyes:  Negative for blurred vision.  Respiratory:  Negative for shortness of breath.   Cardiovascular:  Positive for leg swelling. Negative for chest pain.  Neurological:  Negative for dizziness, weakness and headaches.      Objective:     BP (!) 140/60 (BP Location: Left Arm, Patient Position: Sitting, Cuff Size: Normal)   Pulse 82   Temp 98 F (36.7 C) (Oral)   Ht 5' 3.5" (1.613 m)  Wt 121 lb (54.9 kg)   SpO2 98%   BMI 21.10 kg/m  BP Readings from Last 3 Encounters:  03/28/23 (!) 140/60  03/14/23 114/60  02/26/23 128/64   Wt Readings from Last 3 Encounters:  03/28/23 121 lb (54.9 kg)  03/14/23 123 lb 1.6 oz (55.8 kg)  02/26/23 122 lb 1.6 oz (55.4 kg)      Physical Exam Vitals reviewed.  Cardiovascular:     Rate and Rhythm: Normal rate and regular rhythm.  Pulmonary:     Effort: Pulmonary effort is normal.     Breath sounds: Normal breath sounds.  Musculoskeletal:     Comments: He has trace nonpitting edema ankles feet lower legs bilaterally.  Neurological:     Mental Status: She is alert.      No  results found for any visits on 03/28/23.  Last metabolic panel Lab Results  Component Value Date   GLUCOSE 106 (H) 03/14/2023   NA 143 03/14/2023   K 3.7 03/14/2023   CL 104 03/14/2023   CO2 34 (H) 03/14/2023   BUN 21 03/14/2023   CREATININE 0.82 03/14/2023   GFR 63.54 03/14/2023   CALCIUM 10.9 (H) 03/14/2023   PROT 6.3 03/14/2023   ALBUMIN 3.9 03/14/2023   BILITOT 1.0 03/14/2023   ALKPHOS 88 03/14/2023   AST 17 03/14/2023   ALT 10 03/14/2023   ANIONGAP 7 08/23/2022      The ASCVD Risk score (Arnett DK, et al., 2019) failed to calculate for the following reasons:   The 2019 ASCVD risk score is only valid for ages 56 to 42    Assessment & Plan:   #1 bilateral leg edema probably related to venous stasis and diastolic dysfunction.  Slightly improved with torsemide 40 mg daily.  For cost issues will write for torsemide 20 mg 2 tablets daily.  Recent electrolytes stable.  Continue potassium replacement.  Continue frequent leg elevation.  Follow-up 2 months and reassess electrolytes then  #2 dysuria.  Urine dipstick today shows 1+ blood of uncertain significance.  No leukocytes or nitrites.  Urine culture and microscopy sent.  #3 history of migraine headaches.  Patient takes [place] infrequently.  Refill for as needed use  #4 history of mild hypercalcemia.  Recent corrected calcium 11.  No thiazide use.  We went over differential for hypercalcemia including primary hyperparathyroidism, sarcoidosis, multiple myeloma, medication related, iatrogenic.     we did discuss getting possibly further labs occluding PTH level and possible SPEP but they would like to wait at this time.  Consider follow-up   Return in about 2 months (around 05/29/2023).    Evelena Peat, MD

## 2023-03-29 LAB — URINE CULTURE
MICRO NUMBER:: 15193663
Result:: NO GROWTH
SPECIMEN QUALITY:: ADEQUATE

## 2023-03-29 LAB — URINALYSIS, ROUTINE W REFLEX MICROSCOPIC
Bilirubin Urine: NEGATIVE
Glucose, UA: NEGATIVE
Hgb urine dipstick: NEGATIVE
Ketones, ur: NEGATIVE
Leukocytes,Ua: NEGATIVE
Nitrite: NEGATIVE
Protein, ur: NEGATIVE
Specific Gravity, Urine: 1.01 (ref 1.001–1.035)
pH: 6.5 (ref 5.0–8.0)

## 2023-04-01 ENCOUNTER — Telehealth: Payer: Self-pay | Admitting: Family Medicine

## 2023-04-01 NOTE — Telephone Encounter (Signed)
Pt son Greggory Stallion who is not on DPR is calling and patient can not afford Ubrogepant (UBRELVY) 50 MG TABS  cost 100.00 a pill and would like something else for headaches.  [id number] - [place], [place] - [address] AT [place] [address] Phone: [phone]  Fax: [phone]

## 2023-04-02 NOTE — Telephone Encounter (Signed)
I spoke with the patient's husband and informed him of the message below and voiced understanding.

## 2023-04-02 NOTE — Telephone Encounter (Addendum)
Pt's son Briana Fitzgerald would like to know if Fiorinal would be appropriate, and can MD provide an Rx.  [id number] - [place], [place] - [address] AT [place] [address] Phone: [phone]  Fax: [phone]      Also, Son asked if he was on the [facility]. After reviewing Pt's documents, he is not.  Son asked that we mail a blank DPR to Pt, so that she can assign him as a DP.  DPR mailed to Pt on 04/02/23.

## 2023-04-04 NOTE — Telephone Encounter (Signed)
Patient's husband informed of the message and voiced understanding

## 2023-04-08 ENCOUNTER — Telehealth: Payer: Self-pay | Admitting: Family Medicine

## 2023-04-08 MED ORDER — NAPROXEN 500 MG PO TABS: ORAL_TABLET | ORAL | 0 refills | Status: DC

## 2023-04-08 NOTE — Telephone Encounter (Signed)
Pt's son, Masey Scheiber came in office and dropped off signed DPR form for pt. DPR has been scanned into chart. He is requesting a call from CMA to discuss pt notes from Friday. He would also like to discuss medication Butalbital-Asa-Caffeine Cap. Call back number: [phone].

## 2023-04-08 NOTE — Telephone Encounter (Signed)
Briana Fitzgerald was added to [facility] today and forms have been scanned into chart per documentation from [facility] as stated below.

## 2023-04-08 NOTE — Telephone Encounter (Signed)
Patient's son was informed of message form PCP in regards to Fiorinal from telephone note on 04/01/2023. Patient's son asked if an alternative could be sent due to cost of Ubrevly?

## 2023-04-08 NOTE — Telephone Encounter (Signed)
Patient's son Greggory Stallion informed of the message below and voiced understanding. RX sent

## 2023-05-01 ENCOUNTER — Encounter (INDEPENDENT_AMBULATORY_CARE_PROVIDER_SITE_OTHER): Payer: Self-pay

## 2023-05-30 ENCOUNTER — Encounter: Payer: Self-pay | Admitting: Family Medicine

## 2023-05-30 ENCOUNTER — Ambulatory Visit (INDEPENDENT_AMBULATORY_CARE_PROVIDER_SITE_OTHER): Payer: Medicare Other | Admitting: Family Medicine

## 2023-05-30 VITALS — BP 124/60 | HR 82 | Temp 98.0°F | Ht 63.5 in | Wt 125.5 lb

## 2023-05-30 DIAGNOSIS — I340 Nonrheumatic mitral (valve) insufficiency: Secondary | ICD-10-CM | POA: Diagnosis not present

## 2023-05-30 DIAGNOSIS — I5033 Acute on chronic diastolic (congestive) heart failure: Secondary | ICD-10-CM | POA: Diagnosis not present

## 2023-05-30 DIAGNOSIS — R0600 Dyspnea, unspecified: Secondary | ICD-10-CM

## 2023-05-30 MED ORDER — TRIAMCINOLONE ACETONIDE 0.1 % EX CREA: 1.0000 | TOPICAL_CREAM | Freq: Two times a day (BID) | CUTANEOUS | 3 refills | Status: DC

## 2023-05-30 NOTE — Patient Instructions (Signed)
Make sure not taking Diclofenac regularly.  We are setting up repeat Echo to evaluate the mitral murmur.

## 2023-05-30 NOTE — Progress Notes (Signed)
Established Patient Office Visit  Subjective   Patient ID: Briana Fitzgerald, female    DOB: [birth year]  Age: 89 y.o. MRN: [id number]  Chief Complaint  Patient presents with   Medical Management of Chronic Issues    HPI   Briana Fitzgerald is seen today accompanied by son for follow-up for chronic management.  She has history of diastolic dysfunction with heart failure, history of GERD, osteoarthritis, osteoporosis, history of migraine headaches.  She has had some recent progressive peripheral edema.  She uses compression most days and elevates legs frequently.  No orthopnea.  Occasional dyspnea with exertion but not consistently.  When initially tried furosemide without much improvement and she is currently on torsemide 20 mg twice daily.  Her weight is up a few pounds from last visit.  She is eating well. She has not been taking her potassium supplement.  She apparently had difficulty swallowing the size of tablets.  Son states she is eating very well with lots of potassium rich foods.  Echocardiogram 2019 EF 55 to 60%.  Only trivial aortic valve regurgitation.  Mitral valve calcified annulus with mildly thickened leaflets.  No evidence for mitral stenosis.  Trivial regurgitation.  She has oral diclofenac on medication list from back specialist but not clear if she is taking this.  Her last labs revealed mild hypercalcemia.  No thiazides.  We discussed possible PTH and SPEP last visit but they declined.  She has some hyperpigmentation lower legs and dryness and frequent itching.  Using moisturizers occasionally without much improvement.  Past Medical History:  Diagnosis Date   Arthritis    BACK PAIN 07/18/2008   Bowel obstruction (HCC)    CONSTIPATION 10/02/2010   GERD 10/02/2010   Headache    IBS (irritable bowel syndrome)    LAMINECTOMY, LUMBAR, HX OF 06/16/2008   OSTEOARTHRITIS 10/02/2010   Pneumonia    RESTLESS LEG SYNDROME 10/02/2010   SACROILIAC JOINT DYSFUNCTION 06/16/2008   Sleep  apnea    STYE 10/02/2010   TRANSIENT ISCHEMIC ATTACKS, HX OF 10/02/2010   Past Surgical History:  Procedure Laterality Date   ABDOMINAL HYSTERECTOMY  1979   TAH, fibroids   APOGEE / PERIGEE REPAIR  2005   APPENDECTOMY  1941   EYE SURGERY  2005   catarac,both eyes   LIVER BIOPSY  1997   benign, hemangioma resection   SPINE SURGERY  2006   laminectomy    reports that she has never smoked. She has never used smokeless tobacco. She reports current alcohol use. She reports that she does not use drugs. family history includes Breast cancer in her paternal aunt; Diabetes in her father; Pancreatitis in her brother; Stroke in her father. Allergies  Allergen Reactions   Penicillins Hives, Rash and Other (See Comments)    Blister with PCN    Review of Systems  Constitutional:  Negative for chills and fever.  Eyes:  Negative for blurred vision.  Respiratory:  Positive for shortness of breath. Negative for cough, hemoptysis and wheezing.   Cardiovascular:  Positive for leg swelling. Negative for chest pain.  Skin:  Positive for itching.  Neurological:  Negative for dizziness, focal weakness and headaches.      Objective:     BP 124/60 (BP Location: Left Arm, Patient Position: Sitting, Cuff Size: Normal)   Pulse 82   Temp 98 F (36.7 C) (Oral)   Ht 5' 3.5" (1.613 m)   Wt 125 lb 8 oz (56.9 kg)   SpO2 96%  BMI 21.88 kg/m  BP Readings from Last 3 Encounters:  05/30/23 124/60  03/31/23 (!) 140/60  03/14/23 114/60   Wt Readings from Last 3 Encounters:  05/30/23 125 lb 8 oz (56.9 kg)  03/28/23 121 lb (54.9 kg)  03/14/23 123 lb 1.6 oz (55.8 kg)      Physical Exam Vitals reviewed.  Constitutional:      Appearance: Normal appearance.  Cardiovascular:     Rate and Rhythm: Normal rate and regular rhythm.     Comments: She has prominent systolic murmur over mitral valve region.  This seems more prominent than last visit. Neurological:     Mental Status: She is alert.       No results found for any visits on 05/30/23.  Last CBC Lab Results  Component Value Date   WBC 7.3 08/23/2022   HGB 12.8 08/23/2022   HCT 36.8 08/23/2022   MCV 84.8 08/23/2022   MCH 29.5 08/23/2022   RDW 14.1 08/23/2022   PLT 172 08/23/2022   Last metabolic panel Lab Results  Component Value Date   GLUCOSE 106 (H) 03/14/2023   NA 143 03/14/2023   K 3.7 03/14/2023   CL 104 03/14/2023   CO2 34 (H) 03/14/2023   BUN 21 03/14/2023   CREATININE 0.82 03/14/2023   GFR 63.54 03/14/2023   CALCIUM 10.9 (H) 03/14/2023   PROT 6.3 03/14/2023   ALBUMIN 3.9 03/14/2023   BILITOT 1.0 03/14/2023   ALKPHOS 88 03/14/2023   AST 17 03/14/2023   ALT 10 03/14/2023   ANIONGAP 7 08/23/2022   Last thyroid functions Lab Results  Component Value Date   TSH 2.57 04/26/2022      The ASCVD Risk score (Arnett DK, et al., 2019) failed to calculate for the following reasons:   The 2019 ASCVD risk score is only valid for ages 39 to 39    Assessment & Plan:   Problem List Items Addressed This Visit   None Visit Diagnoses     Dyspnea, unspecified type    -  Primary   Relevant Orders   ECHOCARDIOGRAM COMPLETE   CMP   Mitral pansystolic murmur       Relevant Orders   ECHOCARDIOGRAM COMPLETE   Hypercalcemia       Relevant Orders   CMP     Patient presents with some persistent lower extremity edema but overall stable.  Known diastolic dysfunction.  Suspect heart failure with preserved ejection fraction.  She does have more prominent mitral murmur noted on exam today.  -Continue frequent leg elevation -Continue compression garments as tolerated -Watch sodium intake -Confirm if she is taking oral diclofenac.  If so try to get her off this or any other regular non-steroidals -Recheck comprehensive metabolic panel -Obtain follow-up echocardiogram to assess systolic function and evaluate mitral valve especially -She has some venous stasis hyperpigmentation and frequent itching.   Triamcinolone 0.1% cream to use twice daily as needed for venous stasis dermatitis rash -If labs reveal any hypokalemia will need to look at alternative form of potassium replacement  Return in about 3 months (around 08/29/2023).    Evelena Peat, MD

## 2023-05-31 LAB — COMPREHENSIVE METABOLIC PANEL
AG Ratio: 1.7 (calc) (ref 1.0–2.5)
ALT: 13 U/L (ref 6–29)
AST: 20 U/L (ref 10–35)
Albumin: 3.8 g/dL (ref 3.6–5.1)
Alkaline phosphatase (APISO): 89 U/L (ref 37–153)
BUN/Creatinine Ratio: 21 (calc) (ref 6–22)
BUN: 23 mg/dL (ref 7–25)
CO2: 30 mmol/L (ref 20–32)
Calcium: 10.7 mg/dL — ABNORMAL HIGH (ref 8.6–10.4)
Chloride: 99 mmol/L (ref 98–110)
Creat: 1.1 mg/dL — ABNORMAL HIGH (ref 0.60–0.95)
Globulin: 2.3 g/dL (ref 1.9–3.7)
Glucose, Bld: 155 mg/dL — ABNORMAL HIGH (ref 65–99)
Potassium: 3.6 mmol/L (ref 3.5–5.3)
Sodium: 141 mmol/L (ref 135–146)
Total Bilirubin: 1.5 mg/dL — ABNORMAL HIGH (ref 0.2–1.2)
Total Protein: 6.1 g/dL (ref 6.1–8.1)

## 2023-06-11 ENCOUNTER — Other Ambulatory Visit: Payer: Self-pay | Admitting: Family Medicine

## 2023-06-24 ENCOUNTER — Ambulatory Visit (HOSPITAL_BASED_OUTPATIENT_CLINIC_OR_DEPARTMENT_OTHER): Payer: Medicare Other

## 2023-06-24 DIAGNOSIS — I340 Nonrheumatic mitral (valve) insufficiency: Secondary | ICD-10-CM | POA: Diagnosis not present

## 2023-06-24 DIAGNOSIS — R0600 Dyspnea, unspecified: Secondary | ICD-10-CM

## 2023-06-25 LAB — ECHOCARDIOGRAM COMPLETE
Area-P 1/2: 3.27 cm2
MV VTI: 1.81 cm2
S' Lateral: 1.74 cm

## 2023-08-22 ENCOUNTER — Other Ambulatory Visit: Payer: Self-pay

## 2023-08-22 ENCOUNTER — Ambulatory Visit: Payer: Medicare Other | Admitting: Family Medicine

## 2023-08-22 VITALS — BP 124/60 | HR 82 | Ht 63.5 in

## 2023-08-22 DIAGNOSIS — M25511 Pain in right shoulder: Secondary | ICD-10-CM | POA: Diagnosis not present

## 2023-08-22 DIAGNOSIS — M25512 Pain in left shoulder: Secondary | ICD-10-CM | POA: Diagnosis not present

## 2023-08-22 DIAGNOSIS — M5442 Lumbago with sciatica, left side: Secondary | ICD-10-CM

## 2023-08-22 DIAGNOSIS — G8929 Other chronic pain: Secondary | ICD-10-CM

## 2023-08-22 NOTE — Progress Notes (Signed)
Rubin Payor, PhD, LAT, ATC acting as a scribe for [name], MD.   Briana Fitzgerald is a 89 y.o. female who presents to [facility] today for bilat shoulders and low back pain. Pt was last seen by Dr. Denyse Amass on 11/26/22 for L shoulder and low back pain and was given a L GH steroid injection.   Today, pt reports both of her shoulders are very painful. She is having midline low back pain w/ radiating pain into the L leg. No recent falls.   Pertinent review of systems: No fevers or chills  Relevant historical information: History of sacral fracture.  Osteoporosis.  Hard of hearing   Exam:  BP 124/60   Pulse 82   Ht 5' 3.5" (1.613 m)   SpO2 96%   BMI 21.88 kg/m  General: Well Developed, well nourished, and in no acute distress.   MSK: Shoulders bilaterally reduced range of motion. L-spine nontender to palpation. Lower extremity strength is intact. Antalgic gait.    Lab and Radiology Results  Procedure: Real-time Ultrasound Guided Injection of right shoulder glenohumeral joint posterior approach Device: Philips Affiniti 50G/GE Logiq Images permanently stored and available for review in PACS Verbal informed consent obtained.  Discussed risks and benefits of procedure. Warned about infection, bleeding, hyperglycemia damage to structures among others. Patient expresses understanding and agreement Time-out conducted.   Noted no overlying erythema, induration, or other signs of local infection.   Skin prepped in a sterile fashion.   Local anesthesia: Topical Ethyl chloride.   With sterile technique and under real time ultrasound guidance: 40 mg of Kenalog and 2 mL of Marcaine injected into shoulder joint. Fluid seen entering the joint capsule.   Completed without difficulty   Pain immediately resolved suggesting accurate placement of the medication.   Advised to call if fevers/chills, erythema, induration, drainage, or persistent bleeding.    Images permanently stored and available for review in the ultrasound unit.  Impression: Technically successful ultrasound guided injection.    Procedure: Real-time Ultrasound Guided Injection of left shoulder glenohumeral joint posterior approach Device: Philips Affiniti 50G/GE Logiq Images permanently stored and available for review in PACS Verbal informed consent obtained.  Discussed risks and benefits of procedure. Warned about infection, bleeding, hyperglycemia damage to structures among others. Patient expresses understanding and agreement Time-out conducted.   Noted no overlying erythema, induration, or other signs of local infection.   Skin prepped in a sterile fashion.   Local anesthesia: Topical Ethyl chloride.   With sterile technique and under real time ultrasound guidance: 40 mg of Kenalog and 2 mL's of Marcaine injected into glenohumeral joint. Fluid seen entering the joint capsule.   Completed without difficulty   Pain immediately resolved suggesting accurate placement of the medication.   Advised to call if fevers/chills, erythema, induration, drainage, or persistent bleeding.   Images permanently stored and available for review in the ultrasound unit.  Impression: Technically successful ultrasound guided injection.        Assessment and Plan: 89 y.o. female with bilateral shoulder pain due to exacerbation of DJD.  This is an acute exacerbation of a chronic problem.  Plan for glenohumeral injection today.  Recheck back as needed can repeat this injection every 3 months if needed.  Additionally she has left lumbar radiculopathy which again is an acute exacerbation of a chronic problem.  She had an epidural steroid injection in March which worked.  Plan on reordering that injection now which should be  done in the near future.  Recheck as needed.   PDMP not reviewed this encounter. Orders Placed This Encounter  Procedures   [place] LIMITED JOINT SPACE STRUCTURES UP  BILAT(NO LINKED CHARGES)    Order Specific Question:   Reason for Exam (SYMPTOM  OR DIAGNOSIS REQUIRED)    Answer:   bilateral shoulder pain    Order Specific Question:   Preferred imaging location?    Answer:   [facility]ult [facility] DIAG/THERA/INC NEEDLE/CATH/PLC EPI/LUMB/SAC W/IMG    Level and technique per radiology    Standing Status:   Future    Standing Expiration Date:   09/22/2023    Order Specific Question:   Reason for Exam (SYMPTOM  OR DIAGNOSIS REQUIRED)    Answer:   Low back pain    Order Specific Question:   Preferred Imaging Location?    Answer:   [facility]   No orders of the defined types were placed in this encounter.    Discussed warning signs or symptoms. Please see discharge instructions. Patient expresses understanding.   The above documentation has been reviewed and is accurate and complete [name], M.D.

## 2023-08-22 NOTE — Patient Instructions (Addendum)
Thank you for coming in today.  You received an injection today. Seek immediate medical attention if the joint becomes red, extremely painful, or is oozing fluid.  Please call [facility] ([facility]) at [phone] to schedule your spine injection.    Check back as needed

## 2023-08-25 ENCOUNTER — Ambulatory Visit: Payer: Medicare Other | Admitting: Family Medicine

## 2023-09-02 NOTE — Discharge Instructions (Signed)

## 2023-09-03 ENCOUNTER — Ambulatory Visit
Admission: RE | Admit: 2023-09-03 | Discharge: 2023-09-03 | Disposition: A | Discharge status: Home / Self Care | Payer: Medicare Other | Source: Ambulatory Visit | Attending: Family Medicine | Admitting: Family Medicine

## 2023-09-03 ENCOUNTER — Other Ambulatory Visit: Payer: Self-pay | Admitting: Family Medicine

## 2023-09-03 DIAGNOSIS — G8929 Other chronic pain: Secondary | ICD-10-CM

## 2023-09-03 DIAGNOSIS — M4727 Other spondylosis with radiculopathy, lumbosacral region: Secondary | ICD-10-CM | POA: Diagnosis not present

## 2023-09-03 MED ORDER — METHYLPREDNISOLONE ACETATE 40 MG/ML INJ SUSP (RADIOLOG
80.0000 mg | Freq: Once | INTRAMUSCULAR | Status: AC
  Administered 2023-09-03: 80 mg via EPIDURAL

## 2023-09-03 MED ORDER — IOPAMIDOL (ISOVUE-M 200) INJECTION 41%
1.0000 mL | Freq: Once | INTRAMUSCULAR | Status: AC
  Administered 2023-09-03: 1 mL via EPIDURAL

## 2023-09-11 ENCOUNTER — Other Ambulatory Visit: Payer: Self-pay | Admitting: Family Medicine

## 2023-09-19 ENCOUNTER — Other Ambulatory Visit (HOSPITAL_COMMUNITY): Payer: Self-pay

## 2023-10-01 ENCOUNTER — Ambulatory Visit (INDEPENDENT_AMBULATORY_CARE_PROVIDER_SITE_OTHER): Payer: Medicare Other | Admitting: Family Medicine

## 2023-10-01 VITALS — BP 144/60 | HR 80 | Temp 97.7°F | Wt 119.7 lb

## 2023-10-01 DIAGNOSIS — R739 Hyperglycemia, unspecified: Secondary | ICD-10-CM

## 2023-10-01 DIAGNOSIS — L853 Xerosis cutis: Secondary | ICD-10-CM

## 2023-10-01 DIAGNOSIS — R600 Localized edema: Secondary | ICD-10-CM | POA: Diagnosis not present

## 2023-10-01 MED ORDER — POTASSIUM CHLORIDE CRYS ER 20 MEQ PO TBCR: 20.0000 meq | EXTENDED_RELEASE_TABLET | Freq: Every day | ORAL | 1 refills | Status: DC

## 2023-10-01 NOTE — Progress Notes (Signed)
 Established Patient Office Visit  Subjective   Patient ID: Briana Fitzgerald, female    DOB: [birth year]  Age: 89 y.o. MRN: [id number]  Chief Complaint  Patient presents with   Pain    HPI   Briana Fitzgerald is an 89 year old female with history of diastolic heart failure, obstructive sleep apnea, kyphoscoliosis, osteoarthritis, osteoporosis, migraine headaches.  She is seen today for several issues as follows  She noticed some swelling in her neck region.  She initially thought this may be related to enlarged tonsils.  Does have occasional sore throat.  No recent fever.  She has not noted any adenopathy.  She has significant pruritus involving her skin diffusely.  Currently uses Dial soap.  Usually only showers about 2 times per week.  Does use some moisturizers.  Tries to stay well-hydrated.  History of chronic bilateral lower extremity edema.  Currently stable overall.  No orthopnea.  On torsemide .  Last kidney function electrolytes in September and stable.  She is post to be on potassium supplement but apparently ran out and currently not taking.  We went through a bag of medications and this included several over-the-counter medications.  She also had in her bag naproxen  500 mg twice daily as needed for migraines and Benadryl which she takes infrequently.  Past Medical History:  Diagnosis Date   Arthritis    BACK PAIN 07/18/2008   Bowel obstruction (HCC)    CONSTIPATION 10/02/2010   GERD 10/02/2010   Headache    IBS (irritable bowel syndrome)    LAMINECTOMY, LUMBAR, HX OF 06/16/2008   OSTEOARTHRITIS 10/02/2010   Pneumonia    RESTLESS LEG SYNDROME 10/02/2010   SACROILIAC JOINT DYSFUNCTION 06/16/2008   Sleep apnea    STYE 10/02/2010   TRANSIENT ISCHEMIC ATTACKS, HX OF 10/02/2010   Past Surgical History:  Procedure Laterality Date   ABDOMINAL HYSTERECTOMY  1979   TAH, fibroids   APOGEE / PERIGEE REPAIR  2005   APPENDECTOMY  1941   EYE SURGERY  2005   catarac,both eyes   LIVER  BIOPSY  1997   benign, hemangioma resection   SPINE SURGERY  2006   laminectomy    reports that she has never smoked. She has never used smokeless tobacco. She reports current alcohol use. She reports that she does not use drugs. family history includes Breast cancer in her paternal aunt; Diabetes in her father; Pancreatitis in her brother; Stroke in her father. Allergies  Allergen Reactions   Penicillins Hives, Rash and Other (See Comments)    Blister with PCN    Review of Systems  Constitutional:  Negative for chills and fever.  HENT:  Negative for sinus pain.   Respiratory:  Negative for cough.   Cardiovascular:  Positive for leg swelling. Negative for chest pain.  Skin:  Positive for itching.      Objective:     BP (!) 144/60 (BP Location: Left Arm, Patient Position: Sitting, Cuff Size: Normal)   Pulse 80   Temp 97.7 F (36.5 C) (Oral)   Wt 119 lb 11.2 oz (54.3 kg)   SpO2 96%   BMI 20.87 kg/m  BP Readings from Last 3 Encounters:  10/01/23 (!) 144/60  09/03/23 131/67  08/22/23 124/60   Wt Readings from Last 3 Encounters:  10/01/23 119 lb 11.2 oz (54.3 kg)  05/30/23 125 lb 8 oz (56.9 kg)  03/28/23 121 lb (54.9 kg)      Physical Exam Vitals reviewed.  Constitutional:      General:  She is not in acute distress. Neck:     Comments: She has somewhat prominent submandibular glands bilaterally but no masses palpated.  No parotid masses.  No neck adenopathy.  Area in question is bony prominence in her neck which was felt bilaterally and not worrisome.  She has relatively thin neck. Cardiovascular:     Rate and Rhythm: Normal rate and regular rhythm.  Pulmonary:     Effort: Pulmonary effort is normal.     Breath sounds: Normal breath sounds. No wheezing or rales.  Musculoskeletal:     Cervical back: Neck supple.  Skin:    Comments: Very prominent superficial varicose veins lower legs and feet.  No ulceration.  Very dry skin with some flaking and ichthyosis lower  legs bilaterally.  Neurological:     Mental Status: She is alert.      No results found for any visits on 10/01/23.  Last CBC Lab Results  Component Value Date   WBC 7.3 08/23/2022   HGB 12.8 08/23/2022   HCT 36.8 08/23/2022   MCV 84.8 08/23/2022   MCH 29.5 08/23/2022   RDW 14.1 08/23/2022   PLT 172 08/23/2022   Last metabolic panel Lab Results  Component Value Date   GLUCOSE 155 (H) 05/30/2023   NA 141 05/30/2023   K 3.6 05/30/2023   CL 99 05/30/2023   CO2 30 05/30/2023   BUN 23 05/30/2023   CREATININE 1.10 (H) 05/30/2023   GFR 63.54 03/14/2023   CALCIUM  10.7 (H) 05/30/2023   PROT 6.1 05/30/2023   ALBUMIN 3.9 03/14/2023   BILITOT 1.5 (H) 05/30/2023   ALKPHOS 88 03/14/2023   AST 20 05/30/2023   ALT 13 05/30/2023   ANIONGAP 7 08/23/2022      The ASCVD Risk score (Arnett DK, et al., 2019) failed to calculate for the following reasons:   The 2019 ASCVD risk score is only valid for ages 54 to 63    Assessment & Plan:   #1 dry skin dermatitis.  Stressed importance of good hydration, limited time bathing and if possible with lukewarm water.  Consider change from Dial to [employer].  Continue liberal use of moisturizers after bathing and daily.  #2 somewhat prominent submandibular glands bilaterally.  No worrisome masses.  No adenopathy.  Reassurance.  #3 chronic bilateral lower extremity edema.  Diastolic dysfunction and venous stasis.  Patient on torsemide .  Wrote prescription for potassium to get back on supplement and basic metabolic panel.  Will add A1c as she has had several blood sugars have been elevated on previous labs-but not sure these were fasting glucose  We did look over her medications and have strongly advised her to try to avoid naproxen  and Benadryl given their increased risk especially at her age   Return in about 3 months (around 12/30/2023).    Glean Lamy, MD

## 2023-10-01 NOTE — Patient Instructions (Signed)
 Generally try to avoid naproxen  or diphenhydramine  Set up labs to recheck electrolytes and kidney function within the next week if possible  Consider change from diet also to more gentle soap such as Dove  Avoid prolonged contact with hot water with bathing  Continue liberal use of moisturizers after bathing

## 2023-10-10 ENCOUNTER — Other Ambulatory Visit (INDEPENDENT_AMBULATORY_CARE_PROVIDER_SITE_OTHER): Payer: Medicare Other

## 2023-10-10 DIAGNOSIS — R600 Localized edema: Secondary | ICD-10-CM

## 2023-10-10 DIAGNOSIS — R739 Hyperglycemia, unspecified: Secondary | ICD-10-CM

## 2023-10-10 LAB — BASIC METABOLIC PANEL
BUN: 18 mg/dL (ref 6–23)
CO2: 36 meq/L — ABNORMAL HIGH (ref 19–32)
Calcium: 10.3 mg/dL (ref 8.4–10.5)
Chloride: 99 meq/L (ref 96–112)
Creatinine, Ser: 0.92 mg/dL (ref 0.40–1.20)
GFR: 55.12 mL/min — ABNORMAL LOW (ref >60.00)
Glucose, Bld: 112 mg/dL — ABNORMAL HIGH (ref 70–99)
Potassium: 3.4 meq/L — ABNORMAL LOW (ref 3.5–5.1)
Sodium: 142 meq/L (ref 135–145)

## 2023-10-10 LAB — HEMOGLOBIN A1C: Hgb A1c MFr Bld: 5.1 % (ref 4.6–6.5)

## 2023-10-13 MED ORDER — TRIAMCINOLONE ACETONIDE 0.1 % EX CREA: 1.0000 | TOPICAL_CREAM | Freq: Two times a day (BID) | CUTANEOUS | 1 refills | Status: DC

## 2023-10-13 NOTE — Addendum Note (Signed)
Addended by: Christy Sartorius on: 10/13/2023 08:38 AM   Modules accepted: Orders

## 2023-12-10 ENCOUNTER — Other Ambulatory Visit: Payer: Self-pay | Admitting: Family Medicine

## 2024-01-06 ENCOUNTER — Other Ambulatory Visit: Payer: Self-pay | Admitting: Family Medicine

## 2024-01-06 ENCOUNTER — Other Ambulatory Visit (HOSPITAL_COMMUNITY): Payer: Self-pay

## 2024-01-06 ENCOUNTER — Telehealth: Payer: Self-pay

## 2024-01-06 NOTE — Telephone Encounter (Addendum)
 Pharmacy Patient Advocate Encounter   Received notification from CoverMyMeds that prior authorization for LIDOCAINE  5% PATCHES is required/requested.   Insurance verification completed.   The patient is insured through [facility] .   Received notification from [facility] that Prior Authorization for LIDOCAINE  PATCHES has been DENIED.  See denial reason below. No denial letter attached in CMM. Will attach denial letter to Media tab once received.

## 2024-01-21 ENCOUNTER — Encounter: Payer: Self-pay | Admitting: Family Medicine

## 2024-01-21 ENCOUNTER — Ambulatory Visit (INDEPENDENT_AMBULATORY_CARE_PROVIDER_SITE_OTHER): Admitting: Family Medicine

## 2024-01-21 VITALS — BP 124/50 | HR 74 | Temp 98.0°F | Wt 130.2 lb

## 2024-01-21 DIAGNOSIS — E876 Hypokalemia: Secondary | ICD-10-CM | POA: Diagnosis not present

## 2024-01-21 DIAGNOSIS — R109 Unspecified abdominal pain: Secondary | ICD-10-CM | POA: Diagnosis not present

## 2024-01-21 DIAGNOSIS — R627 Adult failure to thrive: Secondary | ICD-10-CM | POA: Diagnosis not present

## 2024-01-21 LAB — CBC WITH DIFFERENTIAL/PLATELET
Basophils Absolute: 0 10*3/uL (ref 0.0–0.1)
Basophils Relative: 0.6 % (ref 0.0–3.0)
Eosinophils Absolute: 0.2 10*3/uL (ref 0.0–0.7)
Eosinophils Relative: 4.1 % (ref 0.0–5.0)
HCT: 36.2 % (ref 36.0–46.0)
Hemoglobin: 12.3 g/dL (ref 12.0–15.0)
Lymphocytes Relative: 19.9 % (ref 12.0–46.0)
Lymphs Abs: 1 10*3/uL (ref 0.7–4.0)
MCHC: 34.1 g/dL (ref 30.0–36.0)
MCV: 84.7 fl (ref 78.0–100.0)
Monocytes Absolute: 0.4 10*3/uL (ref 0.1–1.0)
Monocytes Relative: 8.6 % (ref 3.0–12.0)
Neutro Abs: 3.5 10*3/uL (ref 1.4–7.7)
Neutrophils Relative %: 66.8 % (ref 43.0–77.0)
Platelets: 131 10*3/uL — ABNORMAL LOW (ref 150.0–400.0)
RBC: 4.27 Mil/uL (ref 3.87–5.11)
RDW: 14.5 % (ref 11.5–15.5)
WBC: 5.2 10*3/uL (ref 4.0–10.5)

## 2024-01-21 LAB — COMPREHENSIVE METABOLIC PANEL WITH GFR
ALT: 18 U/L (ref 0–35)
AST: 26 U/L (ref 0–37)
Albumin: 4.1 g/dL (ref 3.5–5.2)
Alkaline Phosphatase: 67 U/L (ref 39–117)
BUN: 20 mg/dL (ref 6–23)
CO2: 34 meq/L — ABNORMAL HIGH (ref 19–32)
Calcium: 10.9 mg/dL — ABNORMAL HIGH (ref 8.4–10.5)
Chloride: 101 meq/L (ref 96–112)
Creatinine, Ser: 1.02 mg/dL (ref 0.40–1.20)
GFR: 48.6 mL/min — ABNORMAL LOW (ref >60.00)
Glucose, Bld: 102 mg/dL — ABNORMAL HIGH (ref 70–99)
Potassium: 3.1 meq/L — ABNORMAL LOW (ref 3.5–5.1)
Sodium: 144 meq/L (ref 135–145)
Total Bilirubin: 1.3 mg/dL — ABNORMAL HIGH (ref 0.2–1.2)
Total Protein: 6.1 g/dL (ref 6.0–8.3)

## 2024-01-21 NOTE — Progress Notes (Unsigned)
 Established Patient Office Visit  Subjective   Patient ID: Briana Fitzgerald, female    DOB: 1931/12/31  Age: 89 y.o. MRN: [id number]  Chief Complaint  Patient presents with   Medical Management of Chronic Issues   Abdominal Pain    Patient complains of lower left abdominal pain, x2 weeks     HPI  {History (Optional):23778} Briana Fitzgerald is seen today accompanied by son.  She relates some left mid quadrant abdominal pain for couple weeks following a fall.  Very unstable and prone to falls.  She does not recall exactly how she landed.  Pain has been relatively consistent and constant.  No clear exacerbating or alleviating factors.  Normal bowel movements.  No dysuria.  No reported fever.  Good appetite and eating well.  Son relates that she has had general decline especially past few years.  She has very poor posture tends to lean frequently to the left.  Frequently feels sleepy during the day.  Has been taking some supplements for bowel movements recent including Benefiber and MiraLAX but apparently fairly well-controlled bowel movements.  No recent nausea or vomiting.  Denies any other recent injuries with fall.  Did not head injury.  She does see back specialist and has periodic injections.  Past Medical History:  Diagnosis Date   Arthritis    BACK PAIN 07/18/2008   Bowel obstruction (HCC)    CONSTIPATION 10/02/2010   GERD 10/02/2010   Headache    IBS (irritable bowel syndrome)    LAMINECTOMY, LUMBAR, HX OF 06/16/2008   OSTEOARTHRITIS 10/02/2010   Pneumonia    RESTLESS LEG SYNDROME 10/02/2010   SACROILIAC JOINT DYSFUNCTION 06/16/2008   Sleep apnea    STYE 10/02/2010   TRANSIENT ISCHEMIC ATTACKS, HX OF 10/02/2010   Past Surgical History:  Procedure Laterality Date   ABDOMINAL HYSTERECTOMY  1979   TAH, fibroids   APOGEE / PERIGEE REPAIR  2005   APPENDECTOMY  1941   EYE SURGERY  2005   catarac,both eyes   LIVER BIOPSY  1997   benign, hemangioma resection   SPINE SURGERY  2006    laminectomy    reports that she has never smoked. She has never used smokeless tobacco. She reports current alcohol use. She reports that she does not use drugs. family history includes Breast cancer in her paternal aunt; Diabetes in her father; Pancreatitis in her brother; Stroke in her father. Allergies  Allergen Reactions   Penicillins Hives, Rash and Other (See Comments)    Blister with PCN    Review of Systems  Constitutional:  Negative for chills and fever.  Cardiovascular:  Negative for chest pain.  Gastrointestinal:  Positive for abdominal pain. Negative for blood in stool, constipation, diarrhea, melena, nausea and vomiting.  Genitourinary:  Negative for dysuria.      Objective:     BP (!) 124/50 (BP Location: Left Arm, Patient Position: Sitting, Cuff Size: Normal)   Pulse 74   Temp 98 F (36.7 C) (Oral)   Wt 130 lb 3.2 oz (59.1 kg)   SpO2 98%   BMI 22.70 kg/m  BP Readings from Last 3 Encounters:  01/21/24 (!) 124/50  10/01/23 (!) 144/60  09/03/23 131/67   Wt Readings from Last 3 Encounters:  01/21/24 130 lb 3.2 oz (59.1 kg)  10/01/23 119 lb 11.2 oz (54.3 kg)  05/30/23 125 lb 8 oz (56.9 kg)      Physical Exam Vitals reviewed.  Constitutional:      General: She is  not in acute distress.    Appearance: She is not ill-appearing.  Cardiovascular:     Rate and Rhythm: Normal rate and regular rhythm.  Abdominal:     General: Bowel sounds are normal. There is no distension.     Palpations: Abdomen is soft. There is no hepatomegaly, splenomegaly or mass.     Tenderness: There is no abdominal tenderness. There is no guarding or rebound.  Neurological:     Mental Status: She is alert.      Results for orders placed or performed in visit on 01/21/24  CMP  Result Value Ref Range   Sodium 144 135 - 145 mEq/L   Potassium 3.1 (L) 3.5 - 5.1 mEq/L   Chloride 101 96 - 112 mEq/L   CO2 34 (H) 19 - 32 mEq/L   Glucose, Bld 102 (H) 70 - 99 mg/dL   BUN 20 6 - 23  mg/dL   Creatinine, Ser 1.61 0.40 - 1.20 mg/dL   Total Bilirubin 1.3 (H) 0.2 - 1.2 mg/dL   Alkaline Phosphatase 67 39 - 117 U/L   AST 26 0 - 37 U/L   ALT 18 0 - 35 U/L   Total Protein 6.1 6.0 - 8.3 g/dL   Albumin 4.1 3.5 - 5.2 g/dL   GFR 09.60 (L) >45.40 mL/min   Calcium  10.9 (H) 8.4 - 10.5 mg/dL  CBC with Differential/Platelet  Result Value Ref Range   WBC 5.2 4.0 - 10.5 K/uL   RBC 4.27 3.87 - 5.11 Mil/uL   Hemoglobin 12.3 12.0 - 15.0 g/dL   HCT 98.1 19.1 - 47.8 %   MCV 84.7 78.0 - 100.0 fl   MCHC 34.1 30.0 - 36.0 g/dL   RDW 29.5 62.1 - 30.8 %   Platelets 131.0 (L) 150.0 - 400.0 K/uL   Neutrophils Relative % 66.8 43.0 - 77.0 %   Lymphocytes Relative 19.9 12.0 - 46.0 %   Monocytes Relative 8.6 3.0 - 12.0 %   Eosinophils Relative 4.1 0.0 - 5.0 %   Basophils Relative 0.6 0.0 - 3.0 %   Neutro Abs 3.5 1.4 - 7.7 K/uL   Lymphs Abs 1.0 0.7 - 4.0 K/uL   Monocytes Absolute 0.4 0.1 - 1.0 K/uL   Eosinophils Absolute 0.2 0.0 - 0.7 K/uL   Basophils Absolute 0.0 0.0 - 0.1 K/uL    {Labs (Optional):23779}  The ASCVD Risk score (Arnett DK, et al., 2019) failed to calculate for the following reasons:   The 2019 ASCVD risk score is only valid for ages 3 to 62    Assessment & Plan:   Problem List Items Addressed This Visit   None Visit Diagnoses       Abdominal pain, left lateral    -  Primary   Relevant Orders   CBC with Differential/Platelet (Completed)   CMP (Completed)   Urinalysis with Culture Reflex     2-week history of left flank pain following fall.  No urinary symptoms.  No recent fever.  Nonacute abdomen.  Does not any red flags such as fever, appetite change, stool change.  -Obtain further labs including CBC, CMP, urinalysis. - She has very poor posture and high risk for falls.  Usually ambulates with rollator.  She has had extensive physical therapy in the past.  No follow-ups on file.    Glean Lamy, MD

## 2024-01-22 LAB — URINALYSIS W MICROSCOPIC + REFLEX CULTURE
Bacteria, UA: NONE SEEN /HPF
Bilirubin Urine: NEGATIVE
Glucose, UA: NEGATIVE
Hgb urine dipstick: NEGATIVE
Ketones, ur: NEGATIVE
Leukocyte Esterase: NEGATIVE
Nitrites, Initial: NEGATIVE
Protein, ur: NEGATIVE
Specific Gravity, Urine: 1.011 (ref 1.001–1.035)
WBC, UA: NONE SEEN /HPF (ref 0–5)
pH: 6.5 (ref 5.0–8.0)

## 2024-01-22 LAB — NO CULTURE INDICATED

## 2024-01-23 NOTE — Addendum Note (Signed)
 Addended by: Aurelio Leer on: 01/23/2024 09:53 AM   Modules accepted: Orders

## 2024-01-28 ENCOUNTER — Other Ambulatory Visit (HOSPITAL_COMMUNITY): Payer: Self-pay

## 2024-02-18 DIAGNOSIS — M25522 Pain in left elbow: Secondary | ICD-10-CM | POA: Diagnosis not present

## 2024-02-18 DIAGNOSIS — M25512 Pain in left shoulder: Secondary | ICD-10-CM | POA: Diagnosis not present

## 2024-03-09 ENCOUNTER — Other Ambulatory Visit: Payer: Self-pay | Admitting: Family Medicine

## 2024-03-15 ENCOUNTER — Other Ambulatory Visit: Payer: Self-pay | Admitting: Family Medicine

## 2024-04-04 ENCOUNTER — Emergency Department (HOSPITAL_COMMUNITY)

## 2024-04-04 ENCOUNTER — Encounter (HOSPITAL_COMMUNITY): Payer: Self-pay | Admitting: Emergency Medicine

## 2024-04-04 ENCOUNTER — Emergency Department (HOSPITAL_COMMUNITY)
Admission: EM | Admit: 2024-04-04 | Discharge: 2024-04-04 | Disposition: A | Discharge status: Home / Self Care | Attending: Emergency Medicine | Admitting: Emergency Medicine

## 2024-04-04 DIAGNOSIS — S4992XA Unspecified injury of left shoulder and upper arm, initial encounter: Secondary | ICD-10-CM | POA: Diagnosis present

## 2024-04-04 DIAGNOSIS — W010XXA Fall on same level from slipping, tripping and stumbling without subsequent striking against object, initial encounter: Secondary | ICD-10-CM | POA: Diagnosis not present

## 2024-04-04 DIAGNOSIS — S41112A Laceration without foreign body of left upper arm, initial encounter: Secondary | ICD-10-CM | POA: Diagnosis not present

## 2024-04-04 DIAGNOSIS — S61412A Laceration without foreign body of left hand, initial encounter: Secondary | ICD-10-CM | POA: Insufficient documentation

## 2024-04-04 DIAGNOSIS — Y9301 Activity, walking, marching and hiking: Secondary | ICD-10-CM | POA: Diagnosis not present

## 2024-04-04 DIAGNOSIS — S0990XA Unspecified injury of head, initial encounter: Secondary | ICD-10-CM | POA: Insufficient documentation

## 2024-04-04 DIAGNOSIS — Z23 Encounter for immunization: Secondary | ICD-10-CM | POA: Diagnosis not present

## 2024-04-04 DIAGNOSIS — W19XXXA Unspecified fall, initial encounter: Secondary | ICD-10-CM | POA: Diagnosis not present

## 2024-04-04 DIAGNOSIS — M47812 Spondylosis without myelopathy or radiculopathy, cervical region: Secondary | ICD-10-CM | POA: Diagnosis not present

## 2024-04-04 DIAGNOSIS — G9389 Other specified disorders of brain: Secondary | ICD-10-CM | POA: Diagnosis not present

## 2024-04-04 DIAGNOSIS — S50811A Abrasion of right forearm, initial encounter: Secondary | ICD-10-CM | POA: Diagnosis not present

## 2024-04-04 DIAGNOSIS — I6782 Cerebral ischemia: Secondary | ICD-10-CM | POA: Diagnosis not present

## 2024-04-04 DIAGNOSIS — Z043 Encounter for examination and observation following other accident: Secondary | ICD-10-CM | POA: Diagnosis not present

## 2024-04-04 DIAGNOSIS — M4802 Spinal stenosis, cervical region: Secondary | ICD-10-CM | POA: Diagnosis not present

## 2024-04-04 DIAGNOSIS — S40922A Unspecified superficial injury of left upper arm, initial encounter: Secondary | ICD-10-CM | POA: Diagnosis not present

## 2024-04-04 MED ORDER — TETANUS-DIPHTH-ACELL PERTUSSIS 5-2.5-18.5 LF-MCG/0.5 IM SUSY
0.5000 mL | PREFILLED_SYRINGE | Freq: Once | INTRAMUSCULAR | Status: AC
  Administered 2024-04-04: 0.5 mL via INTRAMUSCULAR
  Filled 2024-04-04: qty 0.5

## 2024-04-04 MED ORDER — ACETAMINOPHEN 500 MG PO TABS
ORAL_TABLET | ORAL | Status: AC
  Filled 2024-04-04: qty 2

## 2024-04-04 MED ORDER — BACITRACIN ZINC 500 UNIT/GM EX OINT
TOPICAL_OINTMENT | Freq: Once | CUTANEOUS | Status: AC
  Filled 2024-04-04: qty 0.9

## 2024-04-04 MED ORDER — LIDOCAINE-EPINEPHRINE (PF) 2 %-1:200000 IJ SOLN
20.0000 mL | Freq: Once | INTRAMUSCULAR | Status: AC
  Administered 2024-04-04: 20 mL
  Filled 2024-04-04: qty 20

## 2024-04-04 MED ORDER — ACETAMINOPHEN 500 MG PO TABS
1000.0000 mg | ORAL_TABLET | Freq: Once | ORAL | Status: AC
  Administered 2024-04-04: 1000 mg via ORAL

## 2024-04-04 NOTE — ED Triage Notes (Signed)
 Pt from home via [facility] with reports of slipping and falling. Pt reports pain on her left tricep. Denies hitting head. Pt does have unequal pupils. Pt is oriented x 4. No trauma noted to the head.

## 2024-04-04 NOTE — ED Notes (Signed)
 ER physician to patient bedside

## 2024-04-04 NOTE — Discharge Instructions (Addendum)
 Suture removal in approximately 10 days .  Apply abx ointment to the wound daily.

## 2024-04-04 NOTE — ED Notes (Signed)
 Pt moved to ER room 29. Changed to hospital gown and provided with warm blanket. Pt tearful and upset

## 2024-04-04 NOTE — ED Provider Notes (Signed)
 [facility] AT [facility] Provider Note   CSN: [id number] Arrival date & time: 04/04/24  1426     Patient presents with: Briana Fitzgerald is a 89 y.o. female.    Fall     Patient has a history of back pain, restless leg syndrome, constipation, arthritis, TIA, irritable bowel syndrome.  Patient also has difficulty with gait instability and falls.  Patient was walking today when Briana Fitzgerald accidentally slipped.  Briana Fitzgerald ended up catching her left axillary area on the corner of something.  Patient also sustained mild skin tear to her left hand.  Briana Fitzgerald has an abrasion on her right arm from a previous injury but denies any acute injury to her arm.  Patient denies any headache or neck pain.  No loss of consciousness.  Briana Fitzgerald denies any pain or lower extremities.  Briana Fitzgerald is not having chest pain or abdominal pain.  Prior to Admission medications   Medication Sig Start Date End Date Taking? Authorizing Provider  acetaminophen  (TYLENOL ) 500 MG tablet Take 500 mg by mouth every 6 (six) hours as needed.    [provider]  AMBULATORY NON FORMULARY MEDICATION Rolling walker disp 1 08/14/22   Joane Artist RAMAN, MD  Calcium  Carbonate-Vitamin D 600-400 MG-UNIT tablet Take 1 tablet by mouth daily.    [provider]  COVID-19 mRNA Vac-TriS, Pfizer, (PFIZER-BIONT COVID-19 VAC-TRIS) SUSP injection Inject into the muscle. 04/23/21   Luiz Channel, MD  ferrous sulfate 324 MG TBEC Take 324 mg by mouth 2 (two) times a week.    [provider]  Fluocinolone  Acetonide 0.01 % SHAM Apply topically to scalp once daily as needed for scalp pruritis and leave on for 5 minutes and then rinse off 02/16/21   [name], Wolm ORN, MD  lidocaine  (LIDODERM ) 5 % PLACE 1 PATCH ONTO THE SKIN DAILY. REMOVE & DISCARD PATCH WITHIN 12 HOURS OR AS DIRECTED BY MD 03/15/24   Micheal Wolm ORN, MD  Misc Natural Products (TART CHERRY ADVANCED PO) Take 1,200 mg by mouth daily.    [provider]  mometasone  (ELOCON ) 0.1 % lotion Apply once daily to affected areas of scalp as needed 02/16/21   [name], Wolm ORN, MD  nystatin  (MYCOSTATIN ) 100000 UNIT/ML suspension Take 5 mLs (500,000 Units total) by mouth 4 (four) times daily. 12/16/22   Johnny Garnette LABOR, MD  Omega-3 Fatty Acids (SALMON OIL-1000 PO) Take 1 tablet by mouth daily.    [provider]  potassium chloride  SA (KLOR-CON  M20) 20 MEQ tablet Take 1 tablet (20 mEq total) by mouth daily. 10/01/23   [name], Wolm ORN, MD  pramipexole  (MIRAPEX ) 0.5 MG tablet TAKE 1 TABLET BY MOUTH AT BEDTIME AS NEEDED.AS NEEDED FOR RESTLESS LEGS. 03/09/24   [name], Wolm ORN, MD  pyridoxine (B-6) 100 MG tablet Take 100 mg by mouth daily.    [provider]  rosuvastatin  (CRESTOR ) 5 MG tablet Take 1 tablet (5 mg total) by mouth daily. 12/11/21   Jeffrie Oneil BROCKS, MD  torsemide  (DEMADEX ) 20 MG tablet Take one tablet by mouth twice daily 03/28/23   [name], Wolm ORN, MD  triamcinolone  cream (KENALOG ) 0.1 % Apply 1 Application topically 2 (two) times daily. 10/13/23   [name], Wolm ORN, MD  Ubrogepant  (UBRELVY ) 50 MG TABS Take one tablet by mouth as needed for migraine headache.  May repeat dose times one after 2 hours as needed for ongoing headache. 03/28/23   [name], Wolm ORN, MD  vitamin B-12 (  CYANOCOBALAMIN ) 500 MCG tablet Take 500 mcg by mouth daily.    [provider]  vitamin C (ASCORBIC ACID) 500 MG tablet Take 500 mg by mouth 2 (two) times a week.    [provider]  vitamin E 400 UNIT capsule Take 400 Units by mouth daily.    [provider]    Allergies: Penicillins    Review of Systems  Updated Vital Signs BP (!) 124/58 (BP Location: Right Arm)   Pulse 75   Temp 97.9 F (36.6 C) (Oral)   Resp 15   SpO2 98%   Physical Exam Vitals and nursing note reviewed.  Constitutional:      General: Briana Fitzgerald is not in acute distress.    Appearance: Briana Fitzgerald is well-developed.  HENT:     Head:  Normocephalic and atraumatic.     Right Ear: External ear normal.     Left Ear: External ear normal.  Eyes:     General: No scleral icterus.       Right eye: No discharge.        Left eye: No discharge.     Conjunctiva/sclera: Conjunctivae normal.  Neck:     Trachea: No tracheal deviation.  Cardiovascular:     Rate and Rhythm: Normal rate and regular rhythm.  Pulmonary:     Effort: Pulmonary effort is normal. No respiratory distress.     Breath sounds: Normal breath sounds. No stridor.  Abdominal:     General: There is no distension.  Musculoskeletal:        General: No swelling or deformity.     Cervical back: Normal and neck supple. No tenderness.     Thoracic back: Normal. No tenderness.     Lumbar back: Normal. No tenderness.  Skin:    General: Skin is warm and dry.     Findings: No rash.     Comments: Superficial skin tear of left hand, well-approximated no active bleeding; avulsion flap type laceration in the left axillary region, no evidence of vascular injury.   Neurological:     Mental Status: Briana Fitzgerald is alert. Mental status is at baseline.     Cranial Nerves: No dysarthria or facial asymmetry.     Motor: No seizure activity.     (all labs ordered are listed, but only abnormal results are displayed) Labs Reviewed - No data to display  EKG: None  Radiology: CT Cervical Spine Wo Contrast Result Date: 04/04/2024 CLINICAL DATA:  Status post fall. EXAM: CT CERVICAL SPINE WITHOUT CONTRAST TECHNIQUE: Multidetector CT imaging of the cervical spine was performed without intravenous contrast. Multiplanar CT image reconstructions were also generated. RADIATION DOSE REDUCTION: This exam was performed according to the departmental dose-optimization program which includes automated exposure control, adjustment of the mA and/or kV according to patient size and/or use of iterative reconstruction technique. COMPARISON:  None Available. FINDINGS: Alignment: There is straightening of the  normal cervical spine lordosis. Skull base and vertebrae: No acute fracture. No primary bone lesion or focal pathologic process. Soft tissues and spinal canal: No prevertebral fluid or swelling. No visible canal hematoma. Disc levels: Marked severity endplate sclerosis, anterior osteophyte formation and posterior bony spurring are seen at the levels of C3-C4, C4-C5, C5-C6 and C6-C7. Mild to moderate severity similar appearing changes are seen at the level of C2-C3. Marked severity intervertebral disc space narrowing is seen at C3-C4, C5-C6 and C6-C7 with mild to moderate severity intervertebral disc space narrowing at C2-C3 and C4-C5. Bilateral marked severity multilevel facet joint  hypertrophy is noted. Upper chest: Negative. Other: None. IMPRESSION: 1. Marked severity multilevel degenerative changes, as described above. 2. No acute cervical spine fracture. Electronically Signed   By: Suzen Dials M.D.   On: 04/04/2024 17:39   CT Head Wo Contrast Result Date: 04/04/2024 CLINICAL DATA:  Status post fall. EXAM: CT HEAD WITHOUT CONTRAST TECHNIQUE: Contiguous axial images were obtained from the base of the skull through the vertex without intravenous contrast. RADIATION DOSE REDUCTION: This exam was performed according to the departmental dose-optimization program which includes automated exposure control, adjustment of the mA and/or kV according to patient size and/or use of iterative reconstruction technique. COMPARISON:  None Available. FINDINGS: Brain: There is generalized cerebral atrophy with widening of the extra-axial spaces and ventricular dilatation. There are areas of decreased attenuation within the white matter tracts of the supratentorial brain, consistent with microvascular disease changes. A small chronic left posterior parietal infarct is noted. Vascular: No hyperdense vessel or unexpected calcification. Skull: Normal. Negative for fracture or focal lesion. Sinuses/Orbits: No acute finding.  Other: None. IMPRESSION: 1. No acute intracranial abnormality. 2. Generalized cerebral atrophy with chronic white matter small vessel ischemic changes. Electronically Signed   By: Suzen Dials M.D.   On: 04/04/2024 17:37   DG Humerus Left Result Date: 04/04/2024 CLINICAL DATA:  Status post fall. EXAM: LEFT HUMERUS - 2+ VIEW COMPARISON:  None Available. FINDINGS: There is no evidence of acute fracture or other focal bone lesions. A radiopaque fixation plate and screws are seen along the lateral aspect of the proximal left humerus. Superficial soft tissue defect is seen along the posterolateral aspect of the proximal left humerus. IMPRESSION: 1. No acute osseous abnormality. 2. Postoperative changes of the proximal left humerus. 3. Superficial soft tissue defect along the posterolateral aspect of the proximal left humerus. Electronically Signed   By: Suzen Dials M.D.   On: 04/04/2024 17:30     Procedures   Medications Ordered in the ED  bacitracin  ointment (has no administration in time range)  acetaminophen  (TYLENOL ) tablet 1,000 mg (1,000 mg Oral Given 04/04/24 1543)  lidocaine -EPINEPHrine  (XYLOCAINE  W/EPI) 2 %-1:200000 (PF) injection 20 mL (20 mLs Infiltration Given by Other 04/04/24 1814)  Tdap (BOOSTRIX ) injection 0.5 mL (0.5 mLs Intramuscular Given 04/04/24 1802)    Clinical Course as of 04/04/24 1907  Sun Apr 04, 2024  1803 Humerus x-rays without signs of fracture.  CT head and C-spine without acute injury [JK]    Clinical Course User Index [JK] Randol Simmonds, MD                                 Medical Decision Making Risk OTC drugs. Prescription drug management.   Patient presented to the ED for evaluation after a fall.  Patient does have difficulty with her balance and family has to frequently help her.  Patient denies any acute weakness.  Briana Fitzgerald has not had any fevers or chills.  X-rays do not show any signs of fracture.  CT scans are reassuring.  Laceration repaired by  PA [place].   Will dc home.  Home health ordered to assist with her wound care     Final diagnoses:  Laceration of left axilla, initial encounter    ED Discharge Orders          Ordered    Home Health        04/04/24 1904    Face-to-face encounter (required for Medicare/Medicaid patients)  Comments: I Thom Fetters certify that this patient is under my care and that I, or a nurse practitioner or physician's assistant working with me, had a face-to-face encounter that meets the physician face-to-face encounter requirements with this patient on 04/04/2024. The encounter with the patient was in whole, or in part for the following medical condition(s) which is the primary reason for home health care (List medical condition): fall, laceration   04/04/24 1904               Fetters Thom, MD 04/04/24 1907

## 2024-04-04 NOTE — ED Notes (Signed)
 Pt husband and son to patient bedside

## 2024-04-04 NOTE — ED Notes (Signed)
 This nurse dressed patient's wound on left hand and left axillary region with non-adherent dressings.

## 2024-04-04 NOTE — ED Provider Triage Note (Signed)
 Emergency Medicine Provider Triage Evaluation Note  KAMDYN COVEL , a 89 y.o. female  was evaluated in triage.  Pt complains of mechanical fall.  She denies hitting her head or lose consciousness.  Review of Systems  Positive:  Negative:   Physical Exam  BP (!) 153/78   Pulse 73   Temp 98.4 F (36.9 C) (Oral)   Resp 16   SpO2 94%  Gen:   Awake, no distress   Resp:  Normal effort  MSK:   Moves extremities without difficulty  Other:    Medical Decision Making  Medically screening exam initiated at 5:18 PM.  Appropriate orders placed.  Michiko H Baillie was informed that the remainder of the evaluation will be completed by another provider, this initial triage assessment does not replace that evaluation, and the importance of remaining in the ED until their evaluation is complete.  Will obtain CT imaging.   Donnajean Lynwood VEAR, PA-C 04/04/24 1719

## 2024-04-07 ENCOUNTER — Telehealth: Payer: Self-pay

## 2024-04-07 NOTE — Transitions of Care (Post Inpatient/ED Visit) (Signed)
 04/07/2024  Name: Briana Fitzgerald MRN: [id number] DOB: 23-Aug-1932  Today's TOC FU Call Status: Today's TOC FU Call Status:: Successful TOC FU Call Completed TOC FU Call Complete Date: 04/07/24 Patient's Name and Date of Birth confirmed.  Transition Care Management Follow-up Telephone Call Date of Discharge: 04/04/24 Discharge Facility: [facility]) Type of Discharge: Emergency Department Reason for ED Visit: Other: (Fall/Laceration) How have you been since you were released from the hospital?: Better Any questions or concerns?: No  Items Reviewed: Did you receive and understand the discharge instructions provided?: Yes Medications obtained,verified, and reconciled?: Yes (Medications Reviewed) Any new allergies since your discharge?: No Dietary orders reviewed?: NA Do you have support at home?: Yes People in Home [RPT]: spouse, child(ren), adult  Medications Reviewed Today: Medications Reviewed Today     Reviewed by Dionisio Camelia PARAS, RN (Registered Nurse) on 04/07/24 at [phone]  Med List Status: <None>   Medication Order Taking? Sig Documenting Provider Last Dose Status Informant  acetaminophen  (TYLENOL ) 500 MG tablet [id number] Yes Take 500 mg by mouth every 6 (six) hours as needed. [provider]  Active Self, Pharmacy Records  AMBULATORY JESSE SCHLOSSMAN MEDICATION [id number] Yes Rolling walker disp 1 Joane Artist RAMAN, MD  Active Self, Pharmacy Records  Calcium  Carbonate-Vitamin D 600-400 MG-UNIT tablet [id number] Yes Take 1 tablet by mouth daily. [provider]  Active Self, Pharmacy Records  COVID-19 mRNA Vac-TriS, Pfizer, (PFIZER-BIONT COVID-19 VAC-TRIS) SUSP injection [id number] Yes Inject into the muscle. Luiz Channel, MD  Active Self, Pharmacy Records  ferrous sulfate 324 MG TBEC [id number] Yes Take 324 mg by mouth 2 (two) times a week. [provider]  Active Self, Pharmacy Records  Fluocinolone  Acetonide 0.01 % SHAM [id number] Yes Apply topically  to scalp once daily as needed for scalp pruritis and leave on for 5 minutes and then rinse off [name], Wolm ORN, MD  Active Self, Pharmacy Records  lidocaine  (LIDODERM ) 5 % [id number] Yes PLACE 1 PATCH ONTO THE SKIN DAILY. REMOVE & DISCARD PATCH WITHIN 12 HOURS OR AS DIRECTED BY MD Micheal Wolm ORN, MD  Active   Misc Natural Products (TART CHERRY ADVANCED PO) [id number] Yes Take 1,200 mg by mouth daily. [provider]  Active Self, Pharmacy Records  mometasone  (ELOCON ) 0.1 % lotion [id number] Yes Apply once daily to affected areas of scalp as needed Micheal Wolm ORN, MD  Active Self, Pharmacy Records  nystatin  (MYCOSTATIN ) 100000 UNIT/ML suspension [id number] Yes Take 5 mLs (500,000 Units total) by mouth 4 (four) times daily. Johnny Garnette LABOR, MD  Active   Omega-3 Fatty Acids (SALMON OIL-1000 PO) [id number] Yes Take 1 tablet by mouth daily. [provider]  Active Self, Pharmacy Records  potassium chloride  SA (KLOR-CON  M20) 20 MEQ tablet [id number] Yes Take 1 tablet (20 mEq total) by mouth daily. Micheal Wolm ORN, MD  Active   pramipexole  (MIRAPEX ) 0.5 MG tablet [id number] Yes TAKE 1 TABLET BY MOUTH AT BEDTIME AS NEEDED.AS NEEDED FOR RESTLESS LEGS. Micheal Wolm ORN, MD  Active   pyridoxine (B-6) 100 MG tablet [id number] Yes Take 100 mg by mouth daily. [provider]  Active Self, Pharmacy Records  rosuvastatin  (CRESTOR ) 5 MG tablet [id number] Yes Take 1 tablet (5 mg total) by mouth daily. Jeffrie Oneil BROCKS, MD  Active Self, Pharmacy Records  torsemide  (DEMADEX ) 20 MG tablet [id number] Yes Take one tablet by mouth twice daily [name], Wolm ORN, MD  Active   triamcinolone  cream (KENALOG ) 0.1 % [id number] Yes Apply 1 Application topically 2 (  two) times daily. Micheal Wolm ORN, MD  Active   Ubrogepant  (UBRELVY ) 50 MG TABS [id number] Yes Take one tablet by mouth as needed for migraine headache.  May repeat dose times one after 2 hours as needed for ongoing headache. Micheal Wolm ORN, MD  Active   vitamin B-12 (CYANOCOBALAMIN ) 500 MCG tablet [id number] Yes Take 500 mcg by mouth daily. [provider]  Active Self, Pharmacy Records  vitamin C (ASCORBIC ACID) 500 MG tablet [id number] Yes Take 500 mg by mouth 2 (two) times a week. [provider]  Active Self, Pharmacy Records  vitamin E 400 UNIT capsule [id number] Yes Take 400 Units by mouth daily. [provider]  Active Self, Pharmacy Records            Home Care and Equipment/Supplies: Were Home Health Services Ordered?: No Any new equipment or medical supplies ordered?: No  Functional Questionnaire: Do you need assistance with bathing/showering or dressing?: Yes Do you need assistance with meal preparation?: Yes Do you need assistance with eating?: Yes Do you have difficulty maintaining continence: Yes Do you need assistance with getting out of bed/getting out of a chair/moving?: Yes Do you have difficulty managing or taking your medications?: Yes  Follow up appointments reviewed: PCP Follow-up appointment confirmed?: Yes Date of PCP follow-up appointment?: 04/14/24 Follow-up Provider: [facility] Follow-up appointment confirmed?: NA Do you need transportation to your follow-up appointment?: No Do you understand care options if your condition(s) worsen?: Yes-patient verbalized understanding    SIGNATURE: Camelia Hind, BSN, RN

## 2024-04-14 ENCOUNTER — Telehealth: Payer: Self-pay

## 2024-04-14 ENCOUNTER — Encounter: Payer: Self-pay | Admitting: Family Medicine

## 2024-04-14 ENCOUNTER — Ambulatory Visit (INDEPENDENT_AMBULATORY_CARE_PROVIDER_SITE_OTHER): Admitting: Family Medicine

## 2024-04-14 VITALS — BP 96/60 | HR 77 | Temp 98.1°F

## 2024-04-14 DIAGNOSIS — I878 Other specified disorders of veins: Secondary | ICD-10-CM

## 2024-04-14 DIAGNOSIS — S41112D Laceration without foreign body of left upper arm, subsequent encounter: Secondary | ICD-10-CM

## 2024-04-14 DIAGNOSIS — R627 Adult failure to thrive: Secondary | ICD-10-CM

## 2024-04-14 DIAGNOSIS — E876 Hypokalemia: Secondary | ICD-10-CM | POA: Diagnosis not present

## 2024-04-14 DIAGNOSIS — R600 Localized edema: Secondary | ICD-10-CM

## 2024-04-14 LAB — BASIC METABOLIC PANEL WITH GFR
BUN: 24 mg/dL — ABNORMAL HIGH (ref 6–23)
CO2: 35 meq/L — ABNORMAL HIGH (ref 19–32)
Calcium: 13.1 mg/dL (ref 8.4–10.5)
Chloride: 101 meq/L (ref 96–112)
Creatinine, Ser: 1.14 mg/dL (ref 0.40–1.20)
GFR: 42.46 mL/min — ABNORMAL LOW (ref >60.00)
Glucose, Bld: 134 mg/dL — ABNORMAL HIGH (ref 70–99)
Potassium: 3.3 meq/L — ABNORMAL LOW (ref 3.5–5.1)
Sodium: 143 meq/L (ref 135–145)

## 2024-04-14 NOTE — Telephone Encounter (Signed)
 CRITICAL VALUE STICKER  CRITICAL VALUE: Calcium  13.1  RECEIVER (on-site recipient of call): Sonoma Firkus   DATE & TIME NOTIFIED:  3:17 pm   MESSENGER (representative from lab): Ladora Harp   MD NOTIFIED: Verbal and via Epic   TIME OF NOTIFICATION: 3:18 pm  RESPONSE:  N/A

## 2024-04-14 NOTE — Progress Notes (Signed)
 Established Patient Office Visit  Subjective   Patient ID: Briana Fitzgerald, female    DOB: 1931/12/07  Age: 89 y.o. MRN: [id number]  Chief Complaint  Patient presents with   Hospitalization Follow-up    HPI   Briana Fitzgerald is seen today accompanied by son.  Patient is very hard of hearing which makes communication difficult.  She had recent fall at home.  This occurred in her kitchen.  This occurred a week ago Sunday.  She apparently slipped and landed against the countertop left axillary region with large gaping laceration.  Went to the ER.  Had imaging including plain films left humerus as well as CT head and cervical spine.  No acute fracture.  She had 12 sutures placed.  Seems be recovering reasonably well.  She has very severe arthritis in her lumbar and cervical spine and son states that she tends to walk leaning to the left increasing over time.  She has frequent symptoms of fatigue and dozing off.  Chronic bilateral lower extremity edema.  She is current on torsemide  20 mg twice daily but they state she frequently has several pills per week where she is not taking this consistently.  She has had hypokalemia in the past.  Last potassium 3.1.  Also perhaps not taking her potassium regularly.  Does not elevate legs much during the day.  No dyspnea at rest.  No chest pains.  No recent reported fever. Echocardiogram 10/24 EF 55 to 60%.  No regional wall motion abnormalities.  Left atrial size mild to moderate dilation.  Mitral valve heavily calcified but only trivial mitral valve regurgitation.  No evidence for stenosis.  Past Medical History:  Diagnosis Date   Arthritis    BACK PAIN 07/18/2008   Bowel obstruction (HCC)    CONSTIPATION 10/02/2010   GERD 10/02/2010   Headache    IBS (irritable bowel syndrome)    LAMINECTOMY, LUMBAR, HX OF 06/16/2008   OSTEOARTHRITIS 10/02/2010   Pneumonia    RESTLESS LEG SYNDROME 10/02/2010   SACROILIAC JOINT DYSFUNCTION 06/16/2008   Sleep apnea    STYE  10/02/2010   TRANSIENT ISCHEMIC ATTACKS, HX OF 10/02/2010   Past Surgical History:  Procedure Laterality Date   ABDOMINAL HYSTERECTOMY  1979   TAH, fibroids   APOGEE / PERIGEE REPAIR  2005   APPENDECTOMY  1941   EYE SURGERY  2005   catarac,both eyes   LIVER BIOPSY  1997   benign, hemangioma resection   SPINE SURGERY  2006   laminectomy    reports that she has never smoked. She has never used smokeless tobacco. She reports current alcohol use. She reports that she does not use drugs. family history includes Breast cancer in her paternal aunt; Diabetes in her father; Pancreatitis in her brother; Stroke in her father. Allergies  Allergen Reactions   Penicillins Hives, Rash and Other (See Comments)    Blister with PCN    Review of Systems  Constitutional:  Positive for malaise/fatigue. Negative for chills and fever.  Eyes:  Negative for blurred vision.  Respiratory:  Negative for shortness of breath.   Cardiovascular:  Positive for leg swelling. Negative for chest pain and orthopnea.  Neurological:  Negative for dizziness, weakness and headaches.      Objective:     BP 96/60 (BP Location: Left Arm, Patient Position: Sitting, Cuff Size: Normal)   Pulse 77   Temp 98.1 F (36.7 C) (Oral)   SpO2 94%  BP Readings from Last 3 Encounters:  04/14/24 96/60  04/04/24 127/62  01/21/24 (!) 124/50   Wt Readings from Last 3 Encounters:  01/21/24 130 lb 3.2 oz (59.1 kg)  10/01/23 119 lb 11.2 oz (54.3 kg)  05/30/23 125 lb 8 oz (56.9 kg)      Physical Exam Constitutional:      General: She is not in acute distress.    Appearance: She is well-developed.  Eyes:     Pupils: Pupils are equal, round, and reactive to light.  Neck:     Thyroid : No thyromegaly.     Vascular: No JVD.  Cardiovascular:     Rate and Rhythm: Normal rate and regular rhythm.     Heart sounds: Murmur heard.     No gallop.     Comments: Prominent 3/6 systolic murmur heard over mitral valve  region. Pulmonary:     Effort: Pulmonary effort is normal. No respiratory distress.     Breath sounds: Normal breath sounds. No wheezing or rales.  Musculoskeletal:     Cervical back: Neck supple.     Right lower leg: Edema present.     Left lower leg: Edema present.     Comments: She has pitting edema legs bilaterally which is 2+.  No visible ulcerations.  Skin:    Comments: Left axilla long laceration which is healing well.  12 sutures are removed.  No signs of secondary infection.  Neurological:     Mental Status: She is alert.      No results found for any visits on 04/14/24.  Last CBC Lab Results  Component Value Date   WBC 5.2 01/21/2024   HGB 12.3 01/21/2024   HCT 36.2 01/21/2024   MCV 84.7 01/21/2024   MCH 29.5 08/23/2022   RDW 14.5 01/21/2024   PLT 131.0 (L) 01/21/2024   Last metabolic panel Lab Results  Component Value Date   GLUCOSE 102 (H) 01/21/2024   NA 144 01/21/2024   K 3.1 (L) 01/21/2024   CL 101 01/21/2024   CO2 34 (H) 01/21/2024   BUN 20 01/21/2024   CREATININE 1.02 01/21/2024   GFR 48.60 (L) 01/21/2024   CALCIUM  10.9 (H) 01/21/2024   PROT 6.1 01/21/2024   ALBUMIN 4.1 01/21/2024   BILITOT 1.3 (H) 01/21/2024   ALKPHOS 67 01/21/2024   AST 26 01/21/2024   ALT 18 01/21/2024   ANIONGAP 7 08/23/2022   Last lipids Lab Results  Component Value Date   CHOL 99 04/10/2022   HDL 40.70 04/10/2022   LDLCALC 47 04/10/2022   TRIG 56.0 04/10/2022   CHOLHDL 2 04/10/2022   Last thyroid  functions Lab Results  Component Value Date   TSH 2.57 04/26/2022      The ASCVD Risk score (Arnett DK, et al., 2019) failed to calculate for the following reasons:   The 2019 ASCVD risk score is only valid for ages 73 to 70    Assessment & Plan:   #1 recent fall with large laceration left axilla.  12 sutures removed without difficulty.  Healing well.  Has a bit of surrounding bruising as expected but no concerns otherwise.  Recent x-ray showed no fracture  #2  chronic bilateral leg edema.  Suspect multifactorial.  Suspect considerable component of venous stasis and probably some diastolic dysfunction as well.  Patient is on torsemide  but not taking this consistently.  Will try to have more oversight of medications.  Rather than adding more medication try to establish consistent with torsemide  first  #3 history of hypokalemia.  Ensure she  is taking her potassium supplement with each dose of torsemide .  Recheck basic metabolic panel today.  #4 more prominent mitral murmur.  Discussed with son.  We discussed possible repeat echo but they have decided no matter what the finding would not change management.  They would decline any sort of further intervention if she has more acute mitral valve disorder.  #5 adult failure to thrive.  We discussed possible palliative care consult but they had just gotten some services to try to assist with care at home and they prefer to go that direction first.  May be a candidate for hospice relatively soon No follow-ups on file.    Wolm Scarlet, MD

## 2024-04-14 NOTE — Telephone Encounter (Signed)
 Patient's son Zachary informed of the message below and labs placed

## 2024-04-14 NOTE — Addendum Note (Signed)
 Addended by: METTA KRISTEN CROME on: 04/14/2024 05:25 PM   Modules accepted: Orders

## 2024-04-15 ENCOUNTER — Ambulatory Visit: Payer: Self-pay | Admitting: Family Medicine

## 2024-04-16 ENCOUNTER — Other Ambulatory Visit

## 2024-04-16 LAB — TSH: TSH: 1.51 u[IU]/mL (ref 0.35–5.50)

## 2024-04-16 LAB — COMPREHENSIVE METABOLIC PANEL WITH GFR
ALT: 17 U/L (ref 0–35)
AST: 27 U/L (ref 0–37)
Albumin: 3.5 g/dL (ref 3.5–5.2)
Alkaline Phosphatase: 75 U/L (ref 39–117)
BUN: 27 mg/dL — ABNORMAL HIGH (ref 6–23)
CO2: 34 meq/L — ABNORMAL HIGH (ref 19–32)
Calcium: 10.8 mg/dL — ABNORMAL HIGH (ref 8.4–10.5)
Chloride: 106 meq/L (ref 96–112)
Creatinine, Ser: 1.11 mg/dL (ref 0.40–1.20)
GFR: 43.84 mL/min — ABNORMAL LOW (ref >60.00)
Glucose, Bld: 110 mg/dL — ABNORMAL HIGH (ref 70–99)
Potassium: 3.6 meq/L (ref 3.5–5.1)
Sodium: 146 meq/L — ABNORMAL HIGH (ref 135–145)
Total Bilirubin: 1.2 mg/dL (ref 0.2–1.2)
Total Protein: 6.1 g/dL (ref 6.0–8.3)

## 2024-04-17 LAB — PTH, INTACT AND CALCIUM
Calcium: 10.4 mg/dL (ref 8.6–10.4)
PTH: 44 pg/mL (ref 16–77)

## 2024-04-18 ENCOUNTER — Ambulatory Visit: Payer: Self-pay | Admitting: Family Medicine

## 2024-05-28 ENCOUNTER — Ambulatory Visit: Admitting: Family Medicine

## 2024-05-28 ENCOUNTER — Encounter: Payer: Self-pay | Admitting: Family Medicine

## 2024-05-28 VITALS — BP 96/60 | HR 71 | Temp 98.0°F

## 2024-05-28 DIAGNOSIS — Z5181 Encounter for therapeutic drug level monitoring: Secondary | ICD-10-CM | POA: Diagnosis not present

## 2024-05-28 DIAGNOSIS — Z79899 Other long term (current) drug therapy: Secondary | ICD-10-CM

## 2024-05-28 DIAGNOSIS — G2581 Restless legs syndrome: Secondary | ICD-10-CM

## 2024-05-28 DIAGNOSIS — R600 Localized edema: Secondary | ICD-10-CM | POA: Diagnosis not present

## 2024-05-28 DIAGNOSIS — Z111 Encounter for screening for respiratory tuberculosis: Secondary | ICD-10-CM

## 2024-05-28 NOTE — Progress Notes (Signed)
 Established Patient Office Visit  Subjective   Patient ID: Briana Fitzgerald Fitzgerald, female    DOB: 1932/04/10  Age: 89 y.o. MRN: [id number]  Chief Complaint  Patient presents with   Medical Management of Chronic Issues    HPI   Briana Fitzgerald Fitzgerald is seen today accompanied by son for exam and FL-2 to completion She has history of heart failure with preserved ejection fraction, chronic lower extremity edema, mild hypercalcemia, GERD, osteoarthritis, restless leg syndrome.  Progressive disability in recent years.  She currently lives with her husband who has multiple medical problems as well.  Family in agreement that she would be best served with assisted living at this point.  Her level of care has exceeded her husband's ability at this time.  She has very supportive family.  Currently takes torsemide  20 mg 2 tablets daily along with potassium 20 mill equivalents daily.  She has restless leg syndrome and takes Mirapex  0.5 mg nightly.  Still has chronic edema right greater than left.  No recent orthopnea.  They have tried elevation of legs without much improvement.  She has become essentially nonambulatory at this time.  No history of tuberculosis and no known exposure.  She needs tuberculosis screening.  History of mild hypercalcemia.  They have withheld calcium  supplements.  Recent PTH normal.  Past Medical History:  Diagnosis Date   Arthritis    BACK PAIN 07/18/2008   Bowel obstruction (HCC)    CONSTIPATION 10/02/2010   GERD 10/02/2010   Headache    IBS (irritable bowel syndrome)    LAMINECTOMY, LUMBAR, HX OF 06/16/2008   OSTEOARTHRITIS 10/02/2010   Pneumonia    RESTLESS LEG SYNDROME 10/02/2010   SACROILIAC JOINT DYSFUNCTION 06/16/2008   Sleep apnea    STYE 10/02/2010   TRANSIENT ISCHEMIC ATTACKS, HX OF 10/02/2010   Past Surgical History:  Procedure Laterality Date   ABDOMINAL HYSTERECTOMY  1979   TAH, fibroids   APOGEE / PERIGEE REPAIR  2005   APPENDECTOMY  1941   EYE SURGERY  2005    catarac,both eyes   LIVER BIOPSY  1997   benign, hemangioma resection   SPINE SURGERY  2006   laminectomy    reports that she has never smoked. She has never used smokeless tobacco. She reports current alcohol use. She reports that she does not use drugs. family history includes Breast cancer in her paternal aunt; Diabetes in her father; Pancreatitis in her brother; Stroke in her father. Allergies  Allergen Reactions   Penicillins Hives, Rash and Other (See Comments)    Blister with PCN    Review of Systems  Constitutional:  Negative for chills and fever.  Eyes:  Negative for blurred vision.  Respiratory:  Negative for shortness of breath.   Cardiovascular:  Positive for leg swelling. Negative for chest pain and orthopnea.  Neurological:  Negative for dizziness, weakness and headaches.      Objective:     BP 96/60   Pulse 71   Temp 98 F (36.7 C) (Oral)   SpO2 98%  BP Readings from Last 3 Encounters:  05/28/24 96/60  04/14/24 96/60  04/04/24 127/62   Wt Readings from Last 3 Encounters:  01/21/24 130 lb 3.2 oz (59.1 kg)  10/01/23 119 lb 11.2 oz (54.3 kg)  05/30/23 125 lb 8 oz (56.9 kg)      Physical Exam Vitals reviewed.  Constitutional:      Appearance: She is well-developed.  Eyes:     Pupils: Pupils are equal, round,  and reactive to light.  Neck:     Thyroid : No thyromegaly.     Vascular: No JVD.  Cardiovascular:     Rate and Rhythm: Normal rate and regular rhythm.     Heart sounds:     No gallop.  Pulmonary:     Effort: Pulmonary effort is normal. No respiratory distress.     Breath sounds: Normal breath sounds. No wheezing or rales.  Musculoskeletal:     Cervical back: Neck supple.     Comments: She has bilateral leg edema right greater than left.  No evidence for any skin breakdown.  Neurological:     Mental Status: She is alert.      No results found for any visits on 05/28/24.  Last CBC Lab Results  Component Value Date   WBC 5.2  01/21/2024   HGB 12.3 01/21/2024   HCT 36.2 01/21/2024   MCV 84.7 01/21/2024   MCH 29.5 08/23/2022   RDW 14.5 01/21/2024   PLT 131.0 (L) 01/21/2024   Last metabolic panel Lab Results  Component Value Date   GLUCOSE 110 (H) 04/16/2024   NA 146 (H) 04/16/2024   K 3.6 04/16/2024   CL 106 04/16/2024   CO2 34 (H) 04/16/2024   BUN 27 (H) 04/16/2024   CREATININE 1.11 04/16/2024   GFR 43.84 (L) 04/16/2024   CALCIUM  10.8 (H) 04/16/2024   PROT 6.1 04/16/2024   ALBUMIN 3.5 04/16/2024   BILITOT 1.2 04/16/2024   ALKPHOS 75 04/16/2024   AST 27 04/16/2024   ALT 17 04/16/2024   ANIONGAP 7 08/23/2022   Last lipids Lab Results  Component Value Date   CHOL 99 04/10/2022   HDL 40.70 04/10/2022   LDLCALC 47 04/10/2022   TRIG 56.0 04/10/2022   CHOLHDL 2 04/10/2022   Last thyroid  functions Lab Results  Component Value Date   TSH 1.51 04/16/2024      The ASCVD Risk score (Arnett DK, et al., 2019) failed to calculate for the following reasons:   The 2019 ASCVD risk score is only valid for ages 20 to 8    Assessment & Plan:   #1 chronic bilateral leg edema.  Suspect mostly secondary to heart failure with preserved ejection fraction.  Continue elevation.  Did not seen much improvement previously with compression.  She is currently on torsemide  40 mg daily on potassium supplement.  Recheck basic metabolic panel.  #2 restless leg syndrome.  Currently stable on Mirapex  0.5 mg nightly.  Continue current dosage  #3 in need of FL 2 completion.  Patient needs TB screening.  No active cough or any recent fever.  Ordered QuantiFERON gold plus assay   No follow-ups on file.    Wolm Scarlet, MD

## 2024-05-30 ENCOUNTER — Ambulatory Visit: Payer: Self-pay | Admitting: Family Medicine

## 2024-06-01 LAB — BASIC METABOLIC PANEL WITH GFR
BUN: 23 mg/dL (ref 7–25)
CO2: 30 mmol/L (ref 20–32)
Calcium: 10.1 mg/dL (ref 8.6–10.4)
Chloride: 103 mmol/L (ref 98–110)
Creat: 0.89 mg/dL (ref 0.60–0.95)
Glucose, Bld: 133 mg/dL — ABNORMAL HIGH (ref 65–99)
Potassium: 3.8 mmol/L (ref 3.5–5.3)
Sodium: 139 mmol/L (ref 135–146)
eGFR: 62 mL/min/1.73m2 (ref >=60)

## 2024-06-01 LAB — QUANTIFERON-TB GOLD PLUS
Mitogen-NIL: >10 [IU]/mL
NIL: 0.03 [IU]/mL
QuantiFERON-TB Gold Plus: NEGATIVE
TB1-NIL: 0 [IU]/mL
TB2-NIL: 0.01 [IU]/mL

## 2024-06-04 ENCOUNTER — Other Ambulatory Visit: Payer: Self-pay | Admitting: Family Medicine

## 2024-06-06 ENCOUNTER — Other Ambulatory Visit: Payer: Self-pay | Admitting: Family Medicine

## 2024-06-09 DIAGNOSIS — R296 Repeated falls: Secondary | ICD-10-CM | POA: Diagnosis not present

## 2024-06-09 DIAGNOSIS — R2681 Unsteadiness on feet: Secondary | ICD-10-CM | POA: Diagnosis not present

## 2024-06-10 DIAGNOSIS — M6259 Muscle wasting and atrophy, not elsewhere classified, multiple sites: Secondary | ICD-10-CM | POA: Diagnosis not present

## 2024-06-10 DIAGNOSIS — R2681 Unsteadiness on feet: Secondary | ICD-10-CM | POA: Diagnosis not present

## 2024-06-10 DIAGNOSIS — R2689 Other abnormalities of gait and mobility: Secondary | ICD-10-CM | POA: Diagnosis not present

## 2024-06-10 DIAGNOSIS — R278 Other lack of coordination: Secondary | ICD-10-CM | POA: Diagnosis not present

## 2024-06-10 DIAGNOSIS — R296 Repeated falls: Secondary | ICD-10-CM | POA: Diagnosis not present

## 2024-06-14 DIAGNOSIS — R278 Other lack of coordination: Secondary | ICD-10-CM | POA: Diagnosis not present

## 2024-06-14 DIAGNOSIS — R2681 Unsteadiness on feet: Secondary | ICD-10-CM | POA: Diagnosis not present

## 2024-06-14 DIAGNOSIS — R296 Repeated falls: Secondary | ICD-10-CM | POA: Diagnosis not present

## 2024-06-14 DIAGNOSIS — M6259 Muscle wasting and atrophy, not elsewhere classified, multiple sites: Secondary | ICD-10-CM | POA: Diagnosis not present

## 2024-06-14 DIAGNOSIS — R2689 Other abnormalities of gait and mobility: Secondary | ICD-10-CM | POA: Diagnosis not present

## 2024-06-15 DIAGNOSIS — R2681 Unsteadiness on feet: Secondary | ICD-10-CM | POA: Diagnosis not present

## 2024-06-15 DIAGNOSIS — R296 Repeated falls: Secondary | ICD-10-CM | POA: Diagnosis not present

## 2024-06-18 ENCOUNTER — Other Ambulatory Visit: Payer: Self-pay

## 2024-06-18 ENCOUNTER — Encounter (HOSPITAL_COMMUNITY): Payer: Self-pay | Admitting: Emergency Medicine

## 2024-06-18 ENCOUNTER — Inpatient Hospital Stay (HOSPITAL_COMMUNITY)
Admission: EM | Admit: 2024-06-18 | Discharge: 2024-07-05 | DRG: 871 | Disposition: A | Discharge status: Hospice | Attending: Internal Medicine | Admitting: Internal Medicine

## 2024-06-18 ENCOUNTER — Ambulatory Visit: Payer: Self-pay

## 2024-06-18 ENCOUNTER — Inpatient Hospital Stay (HOSPITAL_COMMUNITY)

## 2024-06-18 ENCOUNTER — Emergency Department (HOSPITAL_COMMUNITY)

## 2024-06-18 DIAGNOSIS — G9341 Metabolic encephalopathy: Secondary | ICD-10-CM | POA: Diagnosis present

## 2024-06-18 DIAGNOSIS — Z604 Social exclusion and rejection: Secondary | ICD-10-CM | POA: Diagnosis present

## 2024-06-18 DIAGNOSIS — E874 Mixed disorder of acid-base balance: Secondary | ICD-10-CM | POA: Diagnosis present

## 2024-06-18 DIAGNOSIS — I5033 Acute on chronic diastolic (congestive) heart failure: Secondary | ICD-10-CM | POA: Diagnosis not present

## 2024-06-18 DIAGNOSIS — R609 Edema, unspecified: Secondary | ICD-10-CM | POA: Diagnosis not present

## 2024-06-18 DIAGNOSIS — Z66 Do not resuscitate: Secondary | ICD-10-CM | POA: Diagnosis not present

## 2024-06-18 DIAGNOSIS — R509 Fever, unspecified: Secondary | ICD-10-CM | POA: Diagnosis not present

## 2024-06-18 DIAGNOSIS — E8809 Other disorders of plasma-protein metabolism, not elsewhere classified: Secondary | ICD-10-CM | POA: Diagnosis present

## 2024-06-18 DIAGNOSIS — I517 Cardiomegaly: Secondary | ICD-10-CM | POA: Diagnosis not present

## 2024-06-18 DIAGNOSIS — F03A0 Unspecified dementia, mild, without behavioral disturbance, psychotic disturbance, mood disturbance, and anxiety: Secondary | ICD-10-CM | POA: Diagnosis present

## 2024-06-18 DIAGNOSIS — E43 Unspecified severe protein-calorie malnutrition: Secondary | ICD-10-CM | POA: Diagnosis present

## 2024-06-18 DIAGNOSIS — J690 Pneumonitis due to inhalation of food and vomit: Secondary | ICD-10-CM | POA: Diagnosis not present

## 2024-06-18 DIAGNOSIS — I4891 Unspecified atrial fibrillation: Secondary | ICD-10-CM | POA: Diagnosis not present

## 2024-06-18 DIAGNOSIS — E876 Hypokalemia: Secondary | ICD-10-CM | POA: Diagnosis present

## 2024-06-18 DIAGNOSIS — D696 Thrombocytopenia, unspecified: Secondary | ICD-10-CM | POA: Diagnosis not present

## 2024-06-18 DIAGNOSIS — I130 Hypertensive heart and chronic kidney disease with heart failure and stage 1 through stage 4 chronic kidney disease, or unspecified chronic kidney disease: Secondary | ICD-10-CM | POA: Diagnosis not present

## 2024-06-18 DIAGNOSIS — N390 Urinary tract infection, site not specified: Secondary | ICD-10-CM | POA: Diagnosis not present

## 2024-06-18 DIAGNOSIS — D649 Anemia, unspecified: Secondary | ICD-10-CM | POA: Diagnosis present

## 2024-06-18 DIAGNOSIS — J189 Pneumonia, unspecified organism: Secondary | ICD-10-CM

## 2024-06-18 DIAGNOSIS — Z681 Body mass index (BMI) 19 or less, adult: Secondary | ICD-10-CM

## 2024-06-18 DIAGNOSIS — R404 Transient alteration of awareness: Secondary | ICD-10-CM | POA: Diagnosis not present

## 2024-06-18 DIAGNOSIS — R161 Splenomegaly, not elsewhere classified: Secondary | ICD-10-CM

## 2024-06-18 DIAGNOSIS — I959 Hypotension, unspecified: Secondary | ICD-10-CM | POA: Diagnosis not present

## 2024-06-18 DIAGNOSIS — E1122 Type 2 diabetes mellitus with diabetic chronic kidney disease: Secondary | ICD-10-CM | POA: Diagnosis not present

## 2024-06-18 DIAGNOSIS — H9190 Unspecified hearing loss, unspecified ear: Secondary | ICD-10-CM | POA: Diagnosis present

## 2024-06-18 DIAGNOSIS — G2581 Restless legs syndrome: Secondary | ICD-10-CM | POA: Diagnosis present

## 2024-06-18 DIAGNOSIS — Z9181 History of falling: Secondary | ICD-10-CM

## 2024-06-18 DIAGNOSIS — R001 Bradycardia, unspecified: Secondary | ICD-10-CM | POA: Diagnosis not present

## 2024-06-18 DIAGNOSIS — R64 Cachexia: Secondary | ICD-10-CM | POA: Diagnosis present

## 2024-06-18 DIAGNOSIS — J1569 Pneumonia due to other gram-negative bacteria: Secondary | ICD-10-CM | POA: Diagnosis present

## 2024-06-18 DIAGNOSIS — Z515 Encounter for palliative care: Secondary | ICD-10-CM | POA: Diagnosis not present

## 2024-06-18 DIAGNOSIS — I480 Paroxysmal atrial fibrillation: Secondary | ICD-10-CM | POA: Diagnosis present

## 2024-06-18 DIAGNOSIS — Z789 Other specified health status: Secondary | ICD-10-CM | POA: Diagnosis not present

## 2024-06-18 DIAGNOSIS — I469 Cardiac arrest, cause unspecified: Secondary | ICD-10-CM | POA: Diagnosis not present

## 2024-06-18 DIAGNOSIS — Z555 Less than a high school diploma: Secondary | ICD-10-CM

## 2024-06-18 DIAGNOSIS — I77810 Thoracic aortic ectasia: Secondary | ICD-10-CM | POA: Diagnosis present

## 2024-06-18 DIAGNOSIS — R1314 Dysphagia, pharyngoesophageal phase: Secondary | ICD-10-CM | POA: Diagnosis present

## 2024-06-18 DIAGNOSIS — I4892 Unspecified atrial flutter: Secondary | ICD-10-CM | POA: Diagnosis present

## 2024-06-18 DIAGNOSIS — A4159 Other Gram-negative sepsis: Secondary | ICD-10-CM | POA: Diagnosis not present

## 2024-06-18 DIAGNOSIS — I5030 Unspecified diastolic (congestive) heart failure: Secondary | ICD-10-CM | POA: Diagnosis not present

## 2024-06-18 DIAGNOSIS — I6782 Cerebral ischemia: Secondary | ICD-10-CM | POA: Diagnosis not present

## 2024-06-18 DIAGNOSIS — I499 Cardiac arrhythmia, unspecified: Secondary | ICD-10-CM | POA: Diagnosis not present

## 2024-06-18 DIAGNOSIS — Z743 Need for continuous supervision: Secondary | ICD-10-CM | POA: Diagnosis not present

## 2024-06-18 DIAGNOSIS — R627 Adult failure to thrive: Secondary | ICD-10-CM | POA: Diagnosis present

## 2024-06-18 DIAGNOSIS — F05 Delirium due to known physiological condition: Secondary | ICD-10-CM | POA: Diagnosis present

## 2024-06-18 DIAGNOSIS — I272 Pulmonary hypertension, unspecified: Secondary | ICD-10-CM | POA: Diagnosis present

## 2024-06-18 DIAGNOSIS — R0989 Other specified symptoms and signs involving the circulatory and respiratory systems: Secondary | ICD-10-CM | POA: Diagnosis not present

## 2024-06-18 DIAGNOSIS — I700 Atherosclerosis of aorta: Secondary | ICD-10-CM | POA: Diagnosis present

## 2024-06-18 DIAGNOSIS — E860 Dehydration: Secondary | ICD-10-CM | POA: Diagnosis present

## 2024-06-18 DIAGNOSIS — E1165 Type 2 diabetes mellitus with hyperglycemia: Secondary | ICD-10-CM | POA: Diagnosis present

## 2024-06-18 DIAGNOSIS — I5032 Chronic diastolic (congestive) heart failure: Secondary | ICD-10-CM | POA: Diagnosis not present

## 2024-06-18 DIAGNOSIS — E8729 Other acidosis: Secondary | ICD-10-CM | POA: Diagnosis not present

## 2024-06-18 DIAGNOSIS — R6521 Severe sepsis with septic shock: Secondary | ICD-10-CM | POA: Diagnosis present

## 2024-06-18 DIAGNOSIS — B029 Zoster without complications: Secondary | ICD-10-CM | POA: Diagnosis not present

## 2024-06-18 DIAGNOSIS — A419 Sepsis, unspecified organism: Secondary | ICD-10-CM | POA: Diagnosis present

## 2024-06-18 DIAGNOSIS — I444 Left anterior fascicular block: Secondary | ICD-10-CM | POA: Diagnosis present

## 2024-06-18 DIAGNOSIS — Z7189 Other specified counseling: Secondary | ICD-10-CM | POA: Diagnosis not present

## 2024-06-18 DIAGNOSIS — I5031 Acute diastolic (congestive) heart failure: Secondary | ICD-10-CM | POA: Diagnosis not present

## 2024-06-18 DIAGNOSIS — N179 Acute kidney failure, unspecified: Secondary | ICD-10-CM | POA: Diagnosis not present

## 2024-06-18 DIAGNOSIS — I3481 Nonrheumatic mitral (valve) annulus calcification: Secondary | ICD-10-CM | POA: Diagnosis not present

## 2024-06-18 DIAGNOSIS — R1319 Other dysphagia: Secondary | ICD-10-CM

## 2024-06-18 DIAGNOSIS — Z1152 Encounter for screening for COVID-19: Secondary | ICD-10-CM

## 2024-06-18 DIAGNOSIS — J9601 Acute respiratory failure with hypoxia: Secondary | ICD-10-CM | POA: Diagnosis not present

## 2024-06-18 DIAGNOSIS — R6520 Severe sepsis without septic shock: Secondary | ICD-10-CM

## 2024-06-18 DIAGNOSIS — J90 Pleural effusion, not elsewhere classified: Secondary | ICD-10-CM | POA: Diagnosis not present

## 2024-06-18 DIAGNOSIS — Z79899 Other long term (current) drug therapy: Secondary | ICD-10-CM

## 2024-06-18 DIAGNOSIS — Z880 Allergy status to penicillin: Secondary | ICD-10-CM

## 2024-06-18 DIAGNOSIS — Z8673 Personal history of transient ischemic attack (TIA), and cerebral infarction without residual deficits: Secondary | ICD-10-CM

## 2024-06-18 DIAGNOSIS — N183 Chronic kidney disease, stage 3 unspecified: Secondary | ICD-10-CM | POA: Diagnosis not present

## 2024-06-18 DIAGNOSIS — R918 Other nonspecific abnormal finding of lung field: Secondary | ICD-10-CM | POA: Diagnosis not present

## 2024-06-18 DIAGNOSIS — R21 Rash and other nonspecific skin eruption: Secondary | ICD-10-CM | POA: Diagnosis not present

## 2024-06-18 DIAGNOSIS — I2510 Atherosclerotic heart disease of native coronary artery without angina pectoris: Secondary | ICD-10-CM | POA: Diagnosis present

## 2024-06-18 DIAGNOSIS — Z7901 Long term (current) use of anticoagulants: Secondary | ICD-10-CM

## 2024-06-18 LAB — COMPREHENSIVE METABOLIC PANEL WITH GFR
ALT: 30 U/L (ref 0–44)
AST: 62 U/L — ABNORMAL HIGH (ref 15–41)
Albumin: 2.8 g/dL — ABNORMAL LOW (ref 3.5–5.0)
Alkaline Phosphatase: 79 U/L (ref 38–126)
Anion gap: 11 (ref 5–15)
BUN: 33 mg/dL — ABNORMAL HIGH (ref 8–23)
CO2: 26 mmol/L (ref 22–32)
Calcium: 9.8 mg/dL (ref 8.9–10.3)
Chloride: 102 mmol/L (ref 98–111)
Creatinine, Ser: 1.43 mg/dL — ABNORMAL HIGH (ref 0.44–1.00)
GFR, Estimated: 35 mL/min — ABNORMAL LOW (ref >60)
Glucose, Bld: 241 mg/dL — ABNORMAL HIGH (ref 70–99)
Potassium: 3.6 mmol/L (ref 3.5–5.1)
Sodium: 139 mmol/L (ref 135–145)
Total Bilirubin: 2 mg/dL — ABNORMAL HIGH (ref 0.0–1.2)
Total Protein: 5.7 g/dL — ABNORMAL LOW (ref 6.5–8.1)

## 2024-06-18 LAB — URINALYSIS, W/ REFLEX TO CULTURE (INFECTION SUSPECTED)
Bilirubin Urine: NEGATIVE
Glucose, UA: NEGATIVE mg/dL
Hgb urine dipstick: NEGATIVE
Ketones, ur: NEGATIVE mg/dL
Nitrite: NEGATIVE
Protein, ur: NEGATIVE mg/dL
Specific Gravity, Urine: 1.01 (ref 1.005–1.030)
pH: 5 (ref 5.0–8.0)

## 2024-06-18 LAB — CBC WITH DIFFERENTIAL/PLATELET
Abs Immature Granulocytes: 0.16 K/uL — ABNORMAL HIGH (ref 0.00–0.07)
Basophils Absolute: 0 K/uL (ref 0.0–0.1)
Basophils Relative: 0 %
Eosinophils Absolute: 0 K/uL (ref 0.0–0.5)
Eosinophils Relative: 0 %
HCT: 38.1 % (ref 36.0–46.0)
Hemoglobin: 12.1 g/dL (ref 12.0–15.0)
Immature Granulocytes: 2 %
Lymphocytes Relative: 9 %
Lymphs Abs: 0.9 K/uL (ref 0.7–4.0)
MCH: 27.1 pg (ref 26.0–34.0)
MCHC: 31.8 g/dL (ref 30.0–36.0)
MCV: 85.4 fL (ref 80.0–100.0)
Monocytes Absolute: 0.6 K/uL (ref 0.1–1.0)
Monocytes Relative: 6 %
Neutro Abs: 8.5 K/uL — ABNORMAL HIGH (ref 1.7–7.7)
Neutrophils Relative %: 83 %
Platelets: 144 K/uL — ABNORMAL LOW (ref 150–400)
RBC: 4.46 MIL/uL (ref 3.87–5.11)
RDW: 15 % (ref 11.5–15.5)
WBC: 10.2 K/uL (ref 4.0–10.5)
nRBC: 0 % (ref 0.0–0.2)

## 2024-06-18 LAB — CREATININE, SERUM
Creatinine, Ser: 1.38 mg/dL — ABNORMAL HIGH (ref 0.44–1.00)
GFR, Estimated: 36 mL/min — ABNORMAL LOW (ref >60)

## 2024-06-18 LAB — I-STAT ARTERIAL BLOOD GAS, ED
Acid-Base Excess: 4 mmol/L — ABNORMAL HIGH (ref 0.0–2.0)
Bicarbonate: 27.1 mmol/L (ref 20.0–28.0)
Calcium, Ion: 1.34 mmol/L (ref 1.15–1.40)
HCT: 31 % — ABNORMAL LOW (ref 36.0–46.0)
Hemoglobin: 10.5 g/dL — ABNORMAL LOW (ref 12.0–15.0)
O2 Saturation: 96 %
Patient temperature: 98.3
Potassium: 3.2 mmol/L — ABNORMAL LOW (ref 3.5–5.1)
Sodium: 140 mmol/L (ref 135–145)
TCO2: 28 mmol/L (ref 22–32)
pCO2 arterial: 33.8 mmHg (ref 32–48)
pH, Arterial: 7.511 — ABNORMAL HIGH (ref 7.35–7.45)
pO2, Arterial: 75 mmHg — ABNORMAL LOW (ref 83–108)

## 2024-06-18 LAB — CBC
HCT: 36.5 % (ref 36.0–46.0)
Hemoglobin: 11.4 g/dL — ABNORMAL LOW (ref 12.0–15.0)
MCH: 27.5 pg (ref 26.0–34.0)
MCHC: 31.2 g/dL (ref 30.0–36.0)
MCV: 88.2 fL (ref 80.0–100.0)
Platelets: 148 K/uL — ABNORMAL LOW (ref 150–400)
RBC: 4.14 MIL/uL (ref 3.87–5.11)
RDW: 15.2 % (ref 11.5–15.5)
WBC: 10.4 K/uL (ref 4.0–10.5)
nRBC: 0 % (ref 0.0–0.2)

## 2024-06-18 LAB — MRSA NEXT GEN BY PCR, NASAL: MRSA by PCR Next Gen: NOT DETECTED

## 2024-06-18 LAB — I-STAT CG4 LACTIC ACID, ED
Lactic Acid, Venous: 2.6 mmol/L (ref 0.5–1.9)
Lactic Acid, Venous: 1.6 mmol/L (ref 0.5–1.9)

## 2024-06-18 LAB — RESP PANEL BY RT-PCR (RSV, FLU A&B, COVID)  RVPGX2
Influenza A by PCR: NEGATIVE
Influenza B by PCR: NEGATIVE
Resp Syncytial Virus by PCR: NEGATIVE
SARS Coronavirus 2 by RT PCR: NEGATIVE

## 2024-06-18 LAB — GLUCOSE, CAPILLARY: Glucose-Capillary: 179 mg/dL — ABNORMAL HIGH (ref 70–99)

## 2024-06-18 MED ORDER — NOREPINEPHRINE 4 MG/250ML-% IV SOLN
INTRAVENOUS | Status: AC
  Administered 2024-06-18: 2 ug/min via INTRAVENOUS
  Filled 2024-06-18: qty 250

## 2024-06-18 MED ORDER — HEPARIN SODIUM (PORCINE) 5000 UNIT/ML IJ SOLN
5000.0000 [IU] | Freq: Three times a day (TID) | INTRAMUSCULAR | Status: DC
  Administered 2024-06-18 – 2024-06-19 (×2): 5000 [IU] via SUBCUTANEOUS
  Filled 2024-06-18 (×2): qty 1

## 2024-06-18 MED ORDER — ACETAMINOPHEN 325 MG PO TABS
650.0000 mg | ORAL_TABLET | Freq: Once | ORAL | Status: AC
  Administered 2024-06-18: 650 mg via ORAL
  Filled 2024-06-18: qty 2

## 2024-06-18 MED ORDER — AMIODARONE HCL IN DEXTROSE 360-4.14 MG/200ML-% IV SOLN
30.0000 mg/h | INTRAVENOUS | Status: DC
  Administered 2024-06-19: 60 mg/h via INTRAVENOUS
  Administered 2024-06-19: 30 mg/h via INTRAVENOUS
  Administered 2024-06-19 (×2): 60 mg/h via INTRAVENOUS
  Administered 2024-06-20: 30 mg/h via INTRAVENOUS
  Administered 2024-06-20 (×2): 60 mg/h via INTRAVENOUS
  Administered 2024-06-21: 30 mg/h via INTRAVENOUS
  Filled 2024-06-18 (×7): qty 200

## 2024-06-18 MED ORDER — DILTIAZEM HCL-DEXTROSE 125-5 MG/125ML-% IV SOLN (PREMIX)
5.0000 mg/h | INTRAVENOUS | Status: DC
  Administered 2024-06-18: 5 mg/h via INTRAVENOUS
  Filled 2024-06-18: qty 125

## 2024-06-18 MED ORDER — DILTIAZEM HCL 25 MG/5ML IV SOLN
INTRAVENOUS | Status: AC
  Administered 2024-06-18: 10 mg via INTRAVENOUS
  Filled 2024-06-18: qty 5

## 2024-06-18 MED ORDER — AMIODARONE HCL IN DEXTROSE 360-4.14 MG/200ML-% IV SOLN
60.0000 mg/h | INTRAVENOUS | Status: AC
  Administered 2024-06-18 (×2): 60 mg/h via INTRAVENOUS
  Filled 2024-06-18 (×2): qty 200

## 2024-06-18 MED ORDER — SODIUM CHLORIDE 0.9 % IV BOLUS
500.0000 mL | Freq: Once | INTRAVENOUS | Status: AC
Start: 2024-06-18 — End: 2024-06-20
  Administered 2024-06-18: 500 mL via INTRAVENOUS

## 2024-06-18 MED ORDER — AMIODARONE LOAD VIA INFUSION
150.0000 mg | Freq: Once | INTRAVENOUS | Status: AC
  Administered 2024-06-18: 150 mg via INTRAVENOUS
  Filled 2024-06-18: qty 83.34

## 2024-06-18 MED ORDER — SODIUM CHLORIDE 0.9 % IV SOLN
100.0000 mg | Freq: Once | INTRAVENOUS | Status: AC
  Administered 2024-06-18: 100 mg via INTRAVENOUS
  Filled 2024-06-18: qty 100

## 2024-06-18 MED ORDER — DOCUSATE SODIUM 100 MG PO CAPS
100.0000 mg | ORAL_CAPSULE | Freq: Two times a day (BID) | ORAL | Status: DC | PRN
  Administered 2024-06-23: 100 mg via ORAL
  Filled 2024-06-18: qty 1

## 2024-06-18 MED ORDER — CEFTRIAXONE SODIUM 1 G IJ SOLR
1.0000 g | Freq: Once | INTRAMUSCULAR | Status: AC
  Administered 2024-06-18: 1 g via INTRAVENOUS
  Filled 2024-06-18: qty 10

## 2024-06-18 MED ORDER — LACTATED RINGERS IV SOLN: INTRAVENOUS | Status: DC

## 2024-06-18 MED ORDER — NOREPINEPHRINE 4 MG/250ML-% IV SOLN
0.0000 ug/min | INTRAVENOUS | Status: DC
  Administered 2024-06-19: 4 ug/min via INTRAVENOUS
  Administered 2024-06-20 (×2): 6 ug/min via INTRAVENOUS
  Administered 2024-06-21: 2 ug/min via INTRAVENOUS
  Filled 2024-06-18 (×4): qty 250

## 2024-06-18 MED ORDER — DEXTROSE 5 % IV SOLN
10.0000 mg/kg | INTRAVENOUS | Status: DC
  Administered 2024-06-18 – 2024-06-19 (×2): 590 mg via INTRAVENOUS
  Filled 2024-06-18 (×4): qty 11.8

## 2024-06-18 MED ORDER — CHLORHEXIDINE GLUCONATE CLOTH 2 % EX PADS
6.0000 | MEDICATED_PAD | Freq: Every day | CUTANEOUS | Status: DC
  Administered 2024-06-18 – 2024-07-05 (×16): 6 via TOPICAL

## 2024-06-18 MED ORDER — SODIUM CHLORIDE 0.9 % IV BOLUS: 1000.0000 mL | Freq: Once | INTRAVENOUS | Status: DC

## 2024-06-18 MED ORDER — DILTIAZEM HCL 25 MG/5ML IV SOLN
10.0000 mg | Freq: Once | INTRAVENOUS | Status: AC
  Administered 2024-06-18: 10 mg via INTRAVENOUS

## 2024-06-18 MED ORDER — SODIUM CHLORIDE 0.9 % IV SOLN
500.0000 mg | Freq: Once | INTRAVENOUS | Status: DC
  Filled 2024-06-18: qty 5

## 2024-06-18 MED ORDER — POLYETHYLENE GLYCOL 3350 17 G PO PACK
17.0000 g | PACK | Freq: Every day | ORAL | Status: DC | PRN
  Administered 2024-06-23 – 2024-06-24 (×2): 17 g via ORAL
  Filled 2024-06-18 (×2): qty 1

## 2024-06-18 MED ORDER — DILTIAZEM HCL 25 MG/5ML IV SOLN: 10.0000 mg | Freq: Once | INTRAVENOUS | Status: AC

## 2024-06-18 NOTE — ED Notes (Signed)
 2nd Lac due @ 1911

## 2024-06-18 NOTE — Progress Notes (Addendum)
 eLink Physician-Brief Progress Note Patient Name: Briana Fitzgerald DOB: 12-Mar-1932 MRN: [id number]   Date of Service  06/18/2024  HPI/Events of Note  89 year old with a history of heart failure with preserved ejection fraction, TIA, and dementia presented with acute encephalopathy in the setting of left hand shingles developed hypotension and A-fib requiring vasopressors.  Patient is tachycardic, mildly hypotensive saturating 94% on 2 L on low-dose norepinephrine and amiodarone infusion.  Status post acyclovir.  Results consistent with alkalemia, elevated creatinine, radiographs reviewed  eICU Interventions  Continue empiric acyclovir, doxycycline   Maintain amiodarone infusion, norepinephrine as needed, crystalloid infusion with LR  DNR/DNI DVT prophylaxis with heparin GI prophylaxis not indicated   0137 -stable vasopressor requirements, patient requesting a diet, advance to clear liquids  0346 -patient is developing a fever and rigors.  She is disoriented.  Administered ipratropium and Atrovent for her ongoing wheezing.  Has copious coughing with expectoration of sputum. Demerol 12.5mg .  IV Tylenol  x 1, go back to amiodarone 60 mg  0407 -continues to be tachypneic with increased work of breathing.  Will attempt heated high flow for support  Intervention Category Evaluation Type: New Patient Evaluation  [name] 06/18/2024, 8:33 PM

## 2024-06-18 NOTE — Progress Notes (Signed)
 Pharmacy Antibiotic Note  Briana Fitzgerald is a 89 y.o. female for which pharmacy has been consulted for acyclovir dosing for shingles. Patient presenting with AMS and vesicular lesions of left hand. Patient noted to be in AF RVR in the ED.  SCr 1.43 - AKI WBC 10.4; LA 1.6; T 101.9; HR 124; RR 18 COVID neg / flu neg  Plan: Acyclovir 10 mg/kg (590 mg) q24h --LR at 50 ml/hr while on IV acyclovir Transition to PO therapy when appropriate Monitor WBC, fever, renal function, cultures De-escalate when able  Height: 5' 3.5 (161.3 cm) Weight: 59 kg (130 lb 1.1 oz) IBW/kg (Calculated) : 53.55  Temp (24hrs), Avg:100.5 F (38.1 C), Min:99.1 F (37.3 C), Max:101.9 F (38.8 C)  Recent Labs  Lab 06/18/24 1643 06/18/24 1711 06/18/24 1955 06/18/24 2006  WBC 10.2  --  10.4  --   CREATININE 1.43*  --   --   --   LATICACIDVEN  --  2.6*  --  1.6    Estimated Creatinine Clearance: 22.1 mL/min (A) (by C-G formula based on SCr of 1.43 mg/dL (H)).    Allergies  Allergen Reactions   Penicillins Hives, Rash and Other (See Comments)    Blister with PCN   Microbiology results: Pending  Thank you for allowing pharmacy to be a part of this patient's care.  Dorn Buttner, PharmD, BCPS 06/18/2024 8:13 PM ED Clinical Pharmacist -  [phone]

## 2024-06-18 NOTE — Telephone Encounter (Signed)
 Copied from CRM [id number]. Topic: Clinical - Red Word Triage >> Jun 18, 2024  2:20 PM Pinkey ORN wrote: Red Word that prompted transfer to Nurse Triage: Swelling In Legs + Foggy Memory Reason for Disposition  Triager needs further essential information from caller in order to complete triage    Briana Fitzgerald reports that Pt is currently being evaluated by LPN at [facility] and pt husband instructed him to call PCP to secure appt. Triager explained that additional information is needed, and sx may need more urgent attention. Son did note that ALF clinicians were considering ED.   Triager advised clinician for ALF to contact PCP triage for further evaluation. Son Briana Fitzgerald verbalized understanding .  Answer Assessment - Initial Assessment Questions 1. ONSET: When did the swelling start? (e.g., minutes, hours, days)     A while On torsemide  2x daily and recently moved to ALF 2. LOCATION: What part of the leg is swollen?  Are both legs swollen or just one leg?     Bilateral and weeping 3. SEVERITY: How bad is the swelling? (e.g., localized; mild, moderate, severe)     Endorses weeping in a few spots - typically ankles to knees 4. REDNESS: Is there redness or signs of infection?     *No Answer* 5. PAIN: Is the swelling painful to touch? If Yes, ask: How painful is it?   (Scale 1-10; mild, moderate or severe)     *No Answer* 6. FEVER: Do you have a fever? If Yes, ask: What is it, how was it measured, and when did it start?      denies 7. CAUSE: What do you think is causing the leg swelling?     *No Answer* 8. MEDICAL HISTORY: Do you have a history of blood clots (e.g., DVT), cancer, heart failure, kidney disease, or liver failure?     CHF 9. RECURRENT SYMPTOM: Have you had leg swelling before? If Yes, ask: When was the last time? What happened that time?     Yes, reports PCP would give further guidance on diuretics 10. OTHER SYMPTOMS: Do you have any other symptoms?  (e.g., chest pain, difficulty breathing)       Memory issues. 11. PREGNANCY: Is there any chance you are pregnant? When was your last menstrual period?       N/a  Protocols used: Leg Swelling and Edema-A-AH, Information Only Call - No Triage-A-AH

## 2024-06-18 NOTE — ED Provider Notes (Signed)
 [facility] Provider Note   CSN: [id number] Arrival date & time: 06/18/24  1614     Patient presents with: Rash   Dinara H Molchan is a 89 y.o. female history of heart failure, here presenting with altered mental status and rash.  Patient is from a nursing home.  Patient was noted to have rash in the left hand for the last 2 to 3 days.  Family noticed that she is more altered than usual and is slumped over today.  EMS reported low-grade temperature.  Patient unable to answer much questions   The history is provided by the patient.       Prior to Admission medications   Medication Sig Start Date End Date Taking? Authorizing Provider  acetaminophen  (TYLENOL ) 500 MG tablet Take 500 mg by mouth every 6 (six) hours as needed.    [provider]  AMBULATORY NON FORMULARY MEDICATION Rolling walker disp 1 08/14/22   Joane Artist RAMAN, MD  COVID-19 mRNA Vac-TriS, Pfizer, (PFIZER-BIONT COVID-19 VAC-TRIS) SUSP injection Inject into the muscle. 04/23/21   Luiz Channel, MD  potassium chloride  SA (KLOR-CON  M) 20 MEQ tablet TAKE 1 TABLET BY MOUTH EVERY DAY 06/07/24   [name], Wolm ORN, MD  pramipexole  (MIRAPEX ) 0.5 MG tablet TAKE 1 TABLET BY MOUTH AT BEDTIME AS NEEDED.AS NEEDED FOR RESTLESS LEGS. 06/07/24   [name], Wolm ORN, MD  torsemide  (DEMADEX ) 20 MG tablet TAKE 1 TABLET BY MOUTH TWICE A DAY 06/07/24   [name], Wolm ORN, MD    Allergies: Penicillins    Review of Systems  Skin:  Positive for rash.  Psychiatric/Behavioral:  Positive for confusion.   All other systems reviewed and are negative.   Updated Vital Signs BP (!) 121/53 (BP Location: Right Arm)   Pulse 88   Temp (!) 101.9 F (38.8 C) (Rectal)   Resp 20   Ht 5' 3.5 (1.613 m)   Wt 59 kg   SpO2 91%   BMI 22.68 kg/m   Physical Exam Vitals and nursing note reviewed.  Constitutional:      Comments: Confused and hard of hearing  HENT:     Head: Normocephalic.     Nose:  Nose normal.     Mouth/Throat:     Mouth: Mucous membranes are dry.  Eyes:     Extraocular Movements: Extraocular movements intact.     Pupils: Pupils are equal, round, and reactive to light.  Cardiovascular:     Rate and Rhythm: Normal rate and regular rhythm.     Pulses: Normal pulses.     Heart sounds: Normal heart sounds.  Pulmonary:     Effort: Pulmonary effort is normal.     Breath sounds: Normal breath sounds.  Abdominal:     General: Abdomen is flat.     Palpations: Abdomen is soft.  Musculoskeletal:        General: Normal range of motion.     Cervical back: Normal range of motion and neck supple.  Skin:    Comments: Patient has vesicular rash of the left hand and forearm.  Neurological:     Comments: ANO x 1.  Patient moving all extremities.  Psychiatric:     Comments: Unable     (all labs ordered are listed, but only abnormal results are displayed) Labs Reviewed  CULTURE, BLOOD (ROUTINE X 2)  CULTURE, BLOOD (ROUTINE X 2)  RESP PANEL BY RT-PCR (RSV, FLU A&B, COVID)  RVPGX2  CBC WITH DIFFERENTIAL/PLATELET  COMPREHENSIVE  METABOLIC PANEL WITH GFR  URINALYSIS, W/ REFLEX TO CULTURE (INFECTION SUSPECTED)  I-STAT CG4 LACTIC ACID, ED    EKG: None  Radiology: No results found.   Procedures   Medications Ordered in the ED  sodium chloride  0.9 % bolus 1,000 mL (has no administration in time range)  acetaminophen  (TYLENOL ) tablet 650 mg (has no administration in time range)                                    Medical Decision Making KAMALJIT HIZER is a 89 y.o. female here presenting with altered mental status and vesicular lesions of the left hand.  Patient is febrile 101.9 in the ER.  Concern for sepsis from UTI versus pneumonia versus COVID versus shingles.  Patient does have obvious shingles in the left hand.  Plan to get CBC CMP and lactate and culture and chest x-ray urinalysis and COVID test.  Will hold off on antibiotics until there is a clear  source.  5:51 PM Patient went into rapid A-fib with a heart rate in the 170s to 200.  Ordered Cardizem 10 mg IV x 2 and heart rate went down to the 140s.  Will start Cardizem drip.  Previous echo showed EF of 60% with diastolic dysfunction  6:15 pm Had extensive code discussion with family.  Per the husband, patient is DNR and DNI.  They do not want aggressive intervention.  They are okay with IV fluids and antibiotics and pressors.  I switched her to amiodarone and started Levophed.  Also consulted cardiology to see patient to manage her rapid A-fib  7:27 PM Critical care to admit for rapid A-fib, pneumonia, hypotension  Problems Addressed: Atrial fibrillation with rapid ventricular response (HCC): acute illness or injury Community acquired pneumonia of left upper lobe of lung: acute illness or injury Septic shock (HCC): acute illness or injury  Amount and/or Complexity of Data Reviewed Labs: ordered. Decision-making details documented in ED Course. Radiology: ordered and independent interpretation performed. Decision-making details documented in ED Course. ECG/medicine tests: ordered and independent interpretation performed. Decision-making details documented in ED Course.  Risk OTC drugs. Prescription drug management. Decision regarding hospitalization.    Final diagnoses:  None    ED Discharge Orders     None          Patt Alm Macho, MD 06/18/24 1929

## 2024-06-18 NOTE — Consult Note (Signed)
 Cardiology Consultation   Patient ID: Briana Fitzgerald MRN: [id number]; DOB: 11-14-1931  Admit date: 06/18/2024 Date of Consult: 06/18/2024  PCP:  Briana Fitzgerald ORN, MD   [facility] Providers Cardiologist:  Briana Parchment, MD        Patient Profile: Briana Fitzgerald is a 89 y.o. female with a hx of HFPEF who is being seen 06/18/2024 for the evaluation of atrial fibrillation rapid ventricular response at the request of Dr. Patt.  History of Present Illness: [name] is an elderly lady with a history of chronic HFpEF managed with diuretic therapy, remote history of TIA, order line aortic root dilation, asymptomatic mild coronary calcifications incidentally noted on CT, asymptomatic aortoiliac atherosclerotic calcifications, splenomegaly who presents today with fever, hypotension, altered mental status, rash on her left hand and atrial fibrillation with rapid ventricular response with an overall picture of septic shock.  She has had recent steady decline in her overall functional abilities is nonambulatory and recently began living in a nursing facility.  Her husband of 68 years has not been able to take care of her due to increasing frailty and weakness.  In the emergency room she was febrile.  She presented in normal rhythm but around 6 PM developed atrial fibrillation with rapid ventricular response.  She was initially treated with intravenous diltiazem but then developed hypotension and she is currently on intravenous norepinephrine and intravenous amiodarone.  She has received a bolus of IV fluids.  Her husband 68 years is at the bedside.  Both he and her son Briana Fitzgerald point out that they have discussed her fragile status.  They are aware of her poor outcome and make it clear that they do not want aggressive or invasive therapies.  She has advanced directives filled out.  Several possible causes for septic shock are present.  She has a left retrocardiac opacity could represent  pneumonia and she is coughing; this appears the most likely underlying cause.  She has a vesicular rash on her left hand and forearm that might represent shingles, her urinalysis only shows mild leukocyturia.  Blood cultures have been drawn.  Her respiratory panel is negative for COVID-19, RSV or influenza A/B.  Labs are significant for elevated lactic acid levels (2.6), acutely elevated creatinine of 1.43 (baseline around 0.9), hyperglycemia, mildly elevated AST with normal ALT.  WBC is only 10.2 but this is roughly double her usual value.  She has mild thrombocytopenia which is chronic.  Hemoglobin is normal at 12.1.  Her electrocardiogram shows atrial fibrillation rapid ventricular response and a nonspecific intraventricular conduction delay most closely assembling left anterior fascicular block.  There is lateral ST segment depression that may be rate related.  Echocardiogram performed in October 2024 showed normal left ventricular systolic function with EF 55 to 60%, mild to moderately dilated left atrium, borderline aortic dilation.  There is severe mitral calcification without hemodynamic consequences.  Diastolic function was interpreted as indeterminate, but on review is consistent with impaired relaxation, as compared with the previous echocardiogram from 2019 which showed pseudo normal mitral inflow/grade 2 diastolic dysfunction.  Past Medical History:  Diagnosis Date   Arthritis    BACK PAIN 07/18/2008   Bowel obstruction (HCC)    CONSTIPATION 10/02/2010   GERD 10/02/2010   Headache    IBS (irritable bowel syndrome)    LAMINECTOMY, LUMBAR, HX OF 06/16/2008   OSTEOARTHRITIS 10/02/2010   Pneumonia    RESTLESS LEG SYNDROME 10/02/2010   SACROILIAC JOINT DYSFUNCTION 06/16/2008   Sleep apnea  STYE 10/02/2010   TRANSIENT ISCHEMIC ATTACKS, HX OF 10/02/2010    Past Surgical History:  Procedure Laterality Date   ABDOMINAL HYSTERECTOMY  1979   TAH, fibroids   APOGEE / PERIGEE REPAIR  2005    APPENDECTOMY  1941   EYE SURGERY  2005   catarac,both eyes   LIVER BIOPSY  1997   benign, hemangioma resection   SPINE SURGERY  2006   laminectomy       Scheduled Meds:  Chlorhexidine Gluconate Cloth  6 each Topical Daily   heparin  5,000 Units Subcutaneous Q8H   Continuous Infusions:  amiodarone 60 mg/hr (06/18/24 1828)   Followed by   NOREEN ON 06/19/2024] amiodarone     doxycycline  (VIBRAMYCIN ) IV     norepinephrine (LEVOPHED) Adult infusion 4 mcg/min (06/18/24 1855)   PRN Meds: docusate sodium , polyethylene glycol  Allergies:    Allergies  Allergen Reactions   Penicillins Hives, Rash and Other (See Comments)    Blister with PCN    Social History:   Social History   Socioeconomic History   Marital status: Married    Spouse name: Not on file   Number of children: 2   Years of education: Not on file   Highest education level: 11th grade  Occupational History   Occupation: retired  Tobacco Use   Smoking status: Never   Smokeless tobacco: Never  Vaping Use   Vaping status: Never Used  Substance and Sexual Activity   Alcohol use: Yes    Comment: at dinner with friends, 2-3 per month - wine   Drug use: No   Sexual activity: Not on file  Other Topics Concern   Not on file  Social History Narrative   Not on file   Social Drivers of Health   Financial Resource Strain: Low Risk  (05/27/2024)   Overall Financial Resource Strain (CARDIA)    Difficulty of Paying Living Expenses: Not hard at all  Food Insecurity: No Food Insecurity (05/27/2024)   Hunger Vital Sign    Worried About Running Out of Food in the Last Year: Never true    Ran Out of Food in the Last Year: Never true  Transportation Needs: No Transportation Needs (05/27/2024)   PRAPARE - Administrator, Civil Service (Medical): No    Lack of Transportation (Non-Medical): No  Physical Activity: Inactive (05/27/2024)   Exercise Vital Sign    Days of Exercise per Week: 0 days    Minutes of  Exercise per Session: Not on file  Stress: No Stress Concern Present (05/27/2024)   [employer] of Occupational Health - Occupational Stress Questionnaire    Feeling of Stress: Only a little  Social Connections: Socially Isolated (05/27/2024)   Social Connection and Isolation Panel    Frequency of Communication with Friends and Family: Once a week    Frequency of Social Gatherings with Friends and Family: Once a week    Attends Religious Services: Never    Database administrator or Organizations: No    Attends Engineer, structural: Not on file    Marital Status: Married  Catering manager Violence: Not At Risk (09/25/2022)   Humiliation, Afraid, Rape, and Kick questionnaire    Fear of Current or Ex-Partner: No    Emotionally Abused: No    Physically Abused: No    Sexually Abused: No    Family History:    Family History  Problem Relation Age of Onset   Stroke Father  Diabetes Father        type ll   Pancreatitis Brother    Breast cancer Paternal Aunt        aunt   Colon cancer Neg Hx    Stomach cancer Neg Hx    Rectal cancer Neg Hx    Liver cancer Neg Hx    Esophageal cancer Neg Hx      ROS:  Please see the history of present illness.   All other ROS reviewed and negative.     Physical Exam/Data: Vitals:   06/18/24 1745 06/18/24 1800 06/18/24 1815 06/18/24 1830  BP: (!) 117/106 100/81 (!) 91/44 (!) 103/48  Pulse: (!) 147 (!) 129 (!) 136 (!) 137  Resp: (!) 21 20 17 17   Temp:      TempSrc:      SpO2: 93% 96% 98% 98%  Weight:      Height:        Intake/Output Summary (Last 24 hours) at 06/18/2024 1921 Last data filed at 06/18/2024 1818 Gross per 24 hour  Intake 1.92 ml  Output --  Net 1.92 ml      06/18/2024    4:30 PM 01/21/2024    1:57 PM 10/01/2023    4:05 PM  Last 3 Weights  Weight (lbs) 130 lb 1.1 oz 130 lb 3.2 oz 119 lb 11.2 oz  Weight (kg) 59 kg 59.058 kg 54.296 kg     Body mass index is 22.68 kg/m.  General:  Well nourished, well  developed, in no acute distress.  She is intermittently sleepy.  When awake she appears mildly disoriented and repeatedly asks her husband to explain what is going on HEENT: normal Neck: no JVD Vascular: No carotid bruits; Distal pulses 2+ bilaterally Cardiac:  normal S1, S2; RRR; no murmur  Lungs:  clear to auscultation bilaterally, no wheezing, rhonchi or rales  Abd: soft, nontender, no hepatomegaly  Ext: no edema.  Vesicular rash left hand/forearm Musculoskeletal:  No deformities, BUE and BLE strength normal and equal Skin: warm and dry  Neuro:  CNs 2-12 intact, no focal abnormalities noted Psych:  Normal affect   EKG:  The EKG was personally reviewed and demonstrates: Atrial fibrillation rapid ventricular response, ST segment depression in the lateral leads, probably rate related Telemetry:  Telemetry was personally reviewed and demonstrates: Atrial fibrillation with rapid ventricular response  Relevant CV Studies: Reviewed the echocardiogram from 06/24/2023 and 05/19/2018  Laboratory Data: High Sensitivity Troponin:  No results for input(s): TROPONINIHS in the last 720 hours.   Chemistry Recent Labs  Lab 06/18/24 1643  NA 139  K 3.6  CL 102  CO2 26  GLUCOSE 241*  BUN 33*  CREATININE 1.43*  CALCIUM  9.8  GFRNONAA 35*  ANIONGAP 11    Recent Labs  Lab 06/18/24 1643  PROT 5.7*  ALBUMIN 2.8*  AST 62*  ALT 30  ALKPHOS 79  BILITOT 2.0*   Lipids No results for input(s): CHOL, TRIG, HDL, LABVLDL, LDLCALC, CHOLHDL in the last 168 hours.  Hematology Recent Labs  Lab 06/18/24 1643  WBC 10.2  RBC 4.46  HGB 12.1  HCT 38.1  MCV 85.4  MCH 27.1  MCHC 31.8  RDW 15.0  PLT 144*   Thyroid  No results for input(s): TSH, FREET4 in the last 168 hours.  BNPNo results for input(s): BNP, PROBNP in the last 168 hours.  DDimer No results for input(s): DDIMER in the last 168 hours.  Radiology/Studies:  [facility] Chest Port 1 View Result Date:  06/18/2024 CLINICAL DATA:  Fever. EXAM: PORTABLE CHEST 1 VIEW COMPARISON:  May 27, 2018. FINDINGS: Mild cardiomegaly is noted. Bibasilar opacities are noted concerning for pneumonia. Left upper lobe opacity is noted which may represent pneumonia, but underlying neoplasm or malignancy cannot be excluded. Left perihilar prominence is noted concerning for possible mass. CT scan of the chest is recommended for further evaluation. Postsurgical changes are seen involving proximal left humerus. IMPRESSION: 1. Left upper lobe opacity is noted which may represent pneumonia, but underlying neoplasm or malignancy cannot be excluded. Left perihilar prominence is noted concerning for possible mass. CT scan of the chest is recommended for further evaluation. 2. Bibasilar opacities are noted concerning for pneumonia. Electronically Signed   By: Lynwood Landy Raddle M.D.   On: 06/18/2024 17:57     Assessment and Plan: Septic shock: Fever, lactic acidosis, hypotension requiring pressors, acute kidney injury and altered mental status.  She is receiving broad-spectrum antibiotics.  Prognosis is poor due to advanced age and poor functional status.  She has clearly expressed previous wishes to avoid aggressive resuscitation and her husband and son both point out that they are realistic about her prognosis due to her advanced age and point out that she has an advanced directive/DNR (limited code without CPR or intubation). AFib w RVR: New occurrence.  Presented in sinus rhythm but developed atrial fibrillation around 6 PM today.  Rate control has been challenging due to hypotension and she is on intravenous amiodarone.  Consider anticoagulation if the arrhythmia persists. Chronic HFpEF: She has mild lower extremity edema at this time.  There is no evidence of congestive heart failure findings on her chest imaging studies. Splenomegaly: Could represent underlying hematologic disease/malignancy, but is a fairly longstanding  abnormality (at least since 2019).  Could have underlying immunosuppression. Coronary/aortic atherosclerosis: She has not had anginal chest pain.  She has never had manifestations of clinically relevant CAD or PAD.   Risk Assessment/Risk Scores:       New [place]  Heart Association (NYHA) Functional Class NYHA Class IV  CHA2DS2-VASc Score = 7   This indicates a 11.2% annual risk of stroke. The patient's score is based upon: CHF History: 1 HTN History: 0 Diabetes History: 0 Stroke History: 2 Vascular Disease History: 1 Age Score: 2 Gender Score: 1        For questions or updates, please contact [facility] Please consult [email or web address] for contact info under     CRITICAL CARE TIME Performed by: Jerel Balding, MD   Total critical care time: 40 minutes  Critical care time was exclusive of separately billable procedures and treating other patients.  Critical care was necessary to treat or prevent imminent or life-threatening deterioration.  Critical care was time spent personally by me on the following activities: development of treatment plan with patient and/or surrogate as well as nursing, discussions with consultants, evaluation of patient's response to treatment, examination of patient, obtaining history from patient or surrogate, ordering and performing treatments and interventions, ordering and review of laboratory studies, ordering and review of radiographic studies, pulse oximetry and re-evaluation of patient's condition. Signed, Jerel Balding, MD  06/18/2024 7:21 PM

## 2024-06-18 NOTE — H&P (Signed)
 NAME:  HOLLE SPRICK, MRN:  [id number], DOB:  09/29/1931, LOS: 0 ADMISSION DATE:  06/18/2024, CONSULTATION DATE:  06/18/2024 REFERRING MD:  Alm Cave, MD, CHIEF COMPLAINT:  Sepsis  History of Present Illness:  89 y/o female with PMH with h/o HFpEF, TIA, mild dementia who presented from assisted living with AMS and rash on her hand.  Rash x 3-4 days, AMS since today.  She is not able to give any history, history from chart and husband at bedside.  Patient noted to have Shingles on left hand.  She became hypotensive and went into A fib.  He was started on Levophed and Amiodarone drip.  Patient is otherwise DNR/DNI and this is confirmed. ED labs include: Cr 1.38, HgB 10.5, Plt 148, LA 2.6->1.6.  Cxr showing left sided pneumonia.  Pertinent  Medical History  FpEF, TIA, mild dementia  Significant Hospital Events: Including procedures, antibiotic start and stop dates in addition to other pertinent events   N/a  Interim History / Subjective:  N/a  Objective    Blood pressure (!) 82/53, pulse (!) 124, temperature (!) 101.9 F (38.8 C), temperature source Rectal, resp. rate 18, height 5' 3.5 (1.613 m), weight 59 kg, SpO2 97%.        Intake/Output Summary (Last 24 hours) at 06/18/2024 2024 Last data filed at 06/18/2024 1818 Gross per 24 hour  Intake 1.92 ml  Output --  Net 1.92 ml   Filed Weights   06/18/24 1630  Weight: 59 kg    Examination: General: lethargic NAD HENT: pupils reactive no icterus Lungs: few creps, no wheezes Cardiovascular: irreg/irreg, s1s2 no murmurs or rubs Abdomen: soft nt nd bs pos no guarding Extremities: 1-2+ pitting LE edema, no cyanosis, clubbing, left had dermatomal rash/raised papules Neuro: arouaseabke   Resolved problem list   Assessment and Plan  Severe Sepsis Taper Levophed as possible Broad spectrum antibiotics Monitor Is/Os IV fluids Hypotension From sepsis and dehydration IV fluids IV vasopressors Pneumonia Broad spectrum  antibiotics UTI Broad spectrum antibiotics Atrial fib with RVR Seen by Cardiology Amiodarone drip Shingles-left hand Anti-viral therapy Contact isolation AKI Monitor Is/Os Monitor serum Cr Lactic acidosis Improving with treatment of sepsis Hypokalemia Replace electrolytes  Normocytic Anemia Monitor H/H Thrombocytopenia Monitor platlets   Labs   CBC: Recent Labs  Lab 06/18/24 1643 06/18/24 1955 06/18/24 2003  WBC 10.2 10.4  --   NEUTROABS 8.5*  --   --   HGB 12.1 11.4* 10.5*  HCT 38.1 36.5 31.0*  MCV 85.4 88.2  --   PLT 144* 148*  --     Basic Metabolic Panel: Recent Labs  Lab 06/18/24 1643 06/18/24 2003  NA 139 140  K 3.6 3.2*  CL 102  --   CO2 26  --   GLUCOSE 241*  --   BUN 33*  --   CREATININE 1.43*  --   CALCIUM  9.8  --    GFR: Estimated Creatinine Clearance: 22.1 mL/min (A) (by C-G formula based on SCr of 1.43 mg/dL (H)). Recent Labs  Lab 06/18/24 1643 06/18/24 1711 06/18/24 1955 06/18/24 2006  WBC 10.2  --  10.4  --   LATICACIDVEN  --  2.6*  --  1.6    Liver Function Tests: Recent Labs  Lab 06/18/24 1643  AST 62*  ALT 30  ALKPHOS 79  BILITOT 2.0*  PROT 5.7*  ALBUMIN 2.8*   No results for input(s): LIPASE, AMYLASE in the last 168 hours. No results for input(s): AMMONIA in the  last 168 hours.  ABG    Component Value Date/Time   PHART 7.511 (H) 06/18/2024 2003   PCO2ART 33.8 06/18/2024 2003   PO2ART 75 (L) 06/18/2024 2003   HCO3 27.1 06/18/2024 2003   TCO2 28 06/18/2024 2003   O2SAT 96 06/18/2024 2003     Coagulation Profile: No results for input(s): INR, PROTIME in the last 168 hours.  Cardiac Enzymes: No results for input(s): CKTOTAL, CKMB, CKMBINDEX, TROPONINI in the last 168 hours.  HbA1C: Hgb A1c MFr Bld  Date/Time Value Ref Range Status  10/10/2023 12:52 PM 5.1 4.6 - 6.5 % Final    Comment:    Glycemic Control Guidelines for People with Diabetes:Non Diabetic:  <6%Goal of Therapy:  <7%Additional Action Suggested:  >8%     CBG: No results for input(s): GLUCAP in the last 168 hours.  Review of Systems:   Unable to perform, encephalopathy  Past Medical History:  She,  has a past medical history of Arthritis, BACK PAIN (07/18/2008), Bowel obstruction (HCC), CONSTIPATION (10/02/2010), GERD (10/02/2010), Headache, IBS (irritable bowel syndrome), LAMINECTOMY, LUMBAR, HX OF (06/16/2008), OSTEOARTHRITIS (10/02/2010), Pneumonia, RESTLESS LEG SYNDROME (10/02/2010), SACROILIAC JOINT DYSFUNCTION (06/16/2008), Sleep apnea, STYE (10/02/2010), and TRANSIENT ISCHEMIC ATTACKS, HX OF (10/02/2010).   Surgical History:   Past Surgical History:  Procedure Laterality Date   ABDOMINAL HYSTERECTOMY  1979   TAH, fibroids   APOGEE / PERIGEE REPAIR  2005   APPENDECTOMY  1941   EYE SURGERY  2005   catarac,both eyes   LIVER BIOPSY  1997   benign, hemangioma resection   SPINE SURGERY  2006   laminectomy     Social History:   reports that she has never smoked. She has never used smokeless tobacco. She reports current alcohol use. She reports that she does not use drugs.   Family History:  Her family history includes Breast cancer in her paternal aunt; Diabetes in her father; Pancreatitis in her brother; Stroke in her father. There is no history of Colon cancer, Stomach cancer, Rectal cancer, Liver cancer, or Esophageal cancer.   Allergies Allergies  Allergen Reactions   Penicillins Hives, Rash and Other (See Comments)    Blister with PCN     Home Medications  Prior to Admission medications   Medication Sig Start Date End Date Taking? Authorizing Provider  acetaminophen  (TYLENOL ) 500 MG tablet Take 500 mg by mouth every 6 (six) hours as needed.    [provider]  AMBULATORY NON FORMULARY MEDICATION Rolling walker disp 1 08/14/22   Joane Artist RAMAN, MD  COVID-19 mRNA Vac-TriS, Pfizer, (PFIZER-BIONT COVID-19 VAC-TRIS) SUSP injection Inject into the muscle. 04/23/21   Luiz Channel,  MD  potassium chloride  SA (KLOR-CON  M) 20 MEQ tablet TAKE 1 TABLET BY MOUTH EVERY DAY 06/07/24   [name], Wolm ORN, MD  pramipexole  (MIRAPEX ) 0.5 MG tablet TAKE 1 TABLET BY MOUTH AT BEDTIME AS NEEDED.AS NEEDED FOR RESTLESS LEGS. 06/07/24   [name], Wolm ORN, MD  torsemide  (DEMADEX ) 20 MG tablet TAKE 1 TABLET BY MOUTH TWICE A DAY 06/07/24   [name], Wolm ORN, MD     Critical care time: 42   The patient is critically ill with multiple organ system failure and requires high complexity decision making for assessment and support, frequent evaluation and titration of therapies, advanced monitoring, review of radiographic studies and interpretation of complex data.   Critical Care Time devoted to patient care services, exclusive of separately billable procedures, described in this note is 32 minutes.   Orlin Fairly, MD  [facility] See Amion for pager  If no response to pager , please call [phone] until 7pm After 7:00 pm call Elink  [phone] 06/18/2024, 8:25 PM

## 2024-06-18 NOTE — Telephone Encounter (Signed)
 911 was called by this RN and EMS was dispatched to the patient's assisted living facility:  [facility] [address], [place] [address]  FYI Only or Action Required?: FYI only for provider.  Patient was last seen in primary care on 05/28/2024 by Micheal Wolm ORN, MD.  Called Nurse Triage reporting Neurologic Problem.  Symptoms began unknown, last known normal was 06/17/24 at approximately 1300.  Interventions attempted: Nothing.  Symptoms are: rapidly worsening.  Triage Disposition: Call EMS 911 Now  Patient/caregiver understands and will follow disposition?: Yes  Reason for Disposition  [1] SEVERE weakness (e.g., unable to walk or barely able to walk, requires support) AND [2] new-onset or getting worse  Difficult to awaken or acting confused (e.g., disoriented, slurred speech)  Sounds like a life-threatening emergency to the triager  Answer Assessment - Initial Assessment Questions Patient reports pt normally slurs, but today is limp and leaning over in her chair. Patient is not able to follow daughter's instructions to sit up or smile. Reports increased respiratory effort. States pt is normally CAO x 4, today knows self and her daughter, but is not oriented to place or time. Last known normal was yesterday approximately 1300. Pt is nondiabetic and not on blood thinners.   When asked about hx of CVA, daughter believes she may have had previous events, possibly a TIA. Denies any known recent falls.   Patient also reports lower leg edema with weeping and a rash from her left finger to elbow. Unknown onset.  1. SYMPTOM: What is the main symptom you are concerned about? (e.g., weakness, numbness)     See above. Also rash on left   2. ONSET: When did this start? (e.g., minutes, hours, days; while sleeping)     Unknown, last normal   3. LAST NORMAL: When was the last time you (the patient) were normal (no symptoms)?     Yesterday 06/17/24 at  approximately 1300  4. PATTERN Does this come and go, or has it been constant since it started?  Is it present now?     Constant  5. CARDIAC SYMPTOMS: Have you had any of the following symptoms: chest pain, difficulty breathing, palpitations?     Unable to assess pain, daughter reports increased respiratory effort  6. NEUROLOGIC SYMPTOMS: Have you had any of the following symptoms: headache, dizziness, vision loss, double vision, changes in speech, unsteady on your feet?     See above  Protocols used: Neurologic Deficit-A-AH

## 2024-06-18 NOTE — ED Triage Notes (Signed)
 Pt to ER via EMS from [facility].  Pt with new rash to left hand, that started 3-4 days ago.  Rash is purple blisters.  Pt denies itching.  Pt is hard of hearing, but is alert and oriented to baseline.

## 2024-06-18 NOTE — ED Provider Triage Note (Signed)
 Emergency Medicine Provider Triage Evaluation Note  Briana Fitzgerald , a 89 y.o. female  was evaluated in triage.  Pt complains of left hand rash and altered mental status.  Patient is currently from a facility and facility noticed rash in the left hand and per the family, patient appears to be slumped over and less responsive than usual.  Patient is very hard of hearing unable to give me much history  Review of Systems  Positive: Rash on the left hand Negative:   Physical Exam  BP (!) 121/53 (BP Location: Right Arm)   Pulse 88   Temp 99.1 F (37.3 C) (Oral)   Resp 20   Ht 5' 3.5 (1.613 m)   Wt 59 kg   SpO2 91%   BMI 22.68 kg/m  Gen:   Awake, slightly confused Resp:  Normal effort  MSK:   Moves extremities without difficulty  Other:  Vesicular rash in the left hand  Medical Decision Making  Medically screening exam initiated at 4:39 PM.  Appropriate orders placed.  Briana Fitzgerald was informed that the remainder of the evaluation will be completed by another provider, this initial triage assessment does not replace that evaluation, and the importance of remaining in the ED until their evaluation is complete.  Briana Fitzgerald is a 89 y.o. female here presenting with rash in the left hand consistent with shingles.  Patient is very hard of hearing and there is reported worsening confusion compared to baseline.  Patient has no obvious focal neurodeficits.  Plan to check basic labs and CT head.  Patient will need a room for further assessment    Briana Alm Macho, MD 06/18/24 1640

## 2024-06-19 ENCOUNTER — Inpatient Hospital Stay (HOSPITAL_COMMUNITY)

## 2024-06-19 DIAGNOSIS — E8729 Other acidosis: Secondary | ICD-10-CM | POA: Diagnosis not present

## 2024-06-19 DIAGNOSIS — I4891 Unspecified atrial fibrillation: Secondary | ICD-10-CM | POA: Diagnosis not present

## 2024-06-19 DIAGNOSIS — B029 Zoster without complications: Secondary | ICD-10-CM

## 2024-06-19 DIAGNOSIS — N390 Urinary tract infection, site not specified: Secondary | ICD-10-CM | POA: Diagnosis not present

## 2024-06-19 DIAGNOSIS — I469 Cardiac arrest, cause unspecified: Secondary | ICD-10-CM | POA: Diagnosis not present

## 2024-06-19 DIAGNOSIS — A419 Sepsis, unspecified organism: Secondary | ICD-10-CM | POA: Diagnosis not present

## 2024-06-19 LAB — BASIC METABOLIC PANEL WITH GFR
Anion gap: 9 (ref 5–15)
Anion gap: 13 (ref 5–15)
BUN: 31 mg/dL — ABNORMAL HIGH (ref 8–23)
BUN: 32 mg/dL — ABNORMAL HIGH (ref 8–23)
CO2: 25 mmol/L (ref 22–32)
CO2: 25 mmol/L (ref 22–32)
Calcium: 9.5 mg/dL (ref 8.9–10.3)
Calcium: 8.8 mg/dL — ABNORMAL LOW (ref 8.9–10.3)
Chloride: 103 mmol/L (ref 98–111)
Chloride: 102 mmol/L (ref 98–111)
Creatinine, Ser: 1.4 mg/dL — ABNORMAL HIGH (ref 0.44–1.00)
Creatinine, Ser: 0.44 mg/dL (ref 0.44–1.00)
GFR, Estimated: 36 mL/min — ABNORMAL LOW (ref >60)
GFR, Estimated: >60 mL/min (ref >60)
Glucose, Bld: 151 mg/dL — ABNORMAL HIGH (ref 70–99)
Glucose, Bld: 110 mg/dL — ABNORMAL HIGH (ref 70–99)
Potassium: 3.4 mmol/L — ABNORMAL LOW (ref 3.5–5.1)
Potassium: 3.2 mmol/L — ABNORMAL LOW (ref 3.5–5.1)
Sodium: 137 mmol/L (ref 135–145)
Sodium: 140 mmol/L (ref 135–145)

## 2024-06-19 LAB — ECHOCARDIOGRAM COMPLETE
AR max vel: 1.91 cm2
AV Area VTI: 1.73 cm2
AV Area mean vel: 1.81 cm2
AV Mean grad: 3 mmHg
AV Peak grad: 5.5 mmHg
Ao pk vel: 1.17 m/s
Height: 63.5 in
S' Lateral: 2.81 cm
Single Plane A2C EF: 54.9 %
Weight: 1657.86 [oz_av]

## 2024-06-19 LAB — BLOOD CULTURE ID PANEL (REFLEXED) - BCID2

## 2024-06-19 LAB — CBC
HCT: 46.5 % — ABNORMAL HIGH (ref 36.0–46.0)
Hemoglobin: 14.9 g/dL (ref 12.0–15.0)
MCH: 27.7 pg (ref 26.0–34.0)
MCHC: 32 g/dL (ref 30.0–36.0)
MCV: 86.4 fL (ref 80.0–100.0)
Platelets: 155 K/uL (ref 150–400)
RBC: 5.38 MIL/uL — ABNORMAL HIGH (ref 3.87–5.11)
RDW: 15.5 % (ref 11.5–15.5)
WBC: 18.6 K/uL — ABNORMAL HIGH (ref 4.0–10.5)
nRBC: 0 % (ref 0.0–0.2)

## 2024-06-19 LAB — MAGNESIUM: Magnesium: 1.6 mg/dL — ABNORMAL LOW (ref 1.7–2.4)

## 2024-06-19 MED ORDER — LEVALBUTEROL HCL 0.63 MG/3ML IN NEBU
INHALATION_SOLUTION | RESPIRATORY_TRACT | Status: AC
  Administered 2024-06-19: 0.63 mg
  Filled 2024-06-19: qty 3

## 2024-06-19 MED ORDER — MAGNESIUM SULFATE 2 GM/50ML IV SOLN
2.0000 g | Freq: Once | INTRAVENOUS | Status: AC
  Administered 2024-06-19: 2 g via INTRAVENOUS
  Filled 2024-06-19: qty 50

## 2024-06-19 MED ORDER — APIXABAN 2.5 MG PO TABS
2.5000 mg | ORAL_TABLET | Freq: Two times a day (BID) | ORAL | Status: DC
  Administered 2024-06-19 – 2024-07-05 (×33): 2.5 mg via ORAL
  Filled 2024-06-19 (×33): qty 1

## 2024-06-19 MED ORDER — SODIUM CHLORIDE 0.9 % IV SOLN
2.0000 g | INTRAVENOUS | Status: DC
  Administered 2024-06-19 – 2024-06-20 (×2): 2 g via INTRAVENOUS
  Filled 2024-06-19 (×2): qty 20

## 2024-06-19 MED ORDER — SODIUM CHLORIDE 0.9 % IV SOLN
100.0000 mg | Freq: Two times a day (BID) | INTRAVENOUS | Status: DC
  Filled 2024-06-19: qty 100

## 2024-06-19 MED ORDER — IPRATROPIUM BROMIDE 0.02 % IN SOLN
0.5000 mg | Freq: Four times a day (QID) | RESPIRATORY_TRACT | Status: DC
Start: 2024-06-19 — End: 2024-06-21
  Administered 2024-06-19 – 2024-06-21 (×8): 0.5 mg via RESPIRATORY_TRACT
  Filled 2024-06-19 (×9): qty 2.5

## 2024-06-19 MED ORDER — MEPERIDINE HCL 25 MG/ML IJ SOLN
12.5000 mg | Freq: Once | INTRAMUSCULAR | Status: DC
  Filled 2024-06-19: qty 1

## 2024-06-19 MED ORDER — IPRATROPIUM BROMIDE 0.02 % IN SOLN
RESPIRATORY_TRACT | Status: AC
  Administered 2024-06-19: 0.5 mg
  Filled 2024-06-19: qty 2.5

## 2024-06-19 MED ORDER — LACTATED RINGERS IV BOLUS
500.0000 mL | Freq: Once | INTRAVENOUS | Status: AC
  Administered 2024-06-19: 500 mL via INTRAVENOUS

## 2024-06-19 MED ORDER — LEVALBUTEROL HCL 0.63 MG/3ML IN NEBU
0.6300 mg | INHALATION_SOLUTION | Freq: Four times a day (QID) | RESPIRATORY_TRACT | Status: DC | PRN
  Administered 2024-06-19: 0.63 mg via RESPIRATORY_TRACT
  Filled 2024-06-19: qty 3

## 2024-06-19 MED ORDER — ACETAMINOPHEN 10 MG/ML IV SOLN
1000.0000 mg | Freq: Once | INTRAVENOUS | Status: AC
  Administered 2024-06-19: 1000 mg via INTRAVENOUS
  Filled 2024-06-19: qty 100

## 2024-06-19 MED ORDER — ACETAMINOPHEN 325 MG PO TABS
650.0000 mg | ORAL_TABLET | Freq: Four times a day (QID) | ORAL | Status: DC | PRN
  Administered 2024-06-24 – 2024-07-04 (×5): 650 mg via ORAL
  Filled 2024-06-19 (×5): qty 2

## 2024-06-19 MED ORDER — MEPERIDINE HCL 100 MG/ML IJ SOLN
12.5000 mg | Freq: Once | INTRAMUSCULAR | Status: AC
  Administered 2024-06-19: 12.5 mg via INTRAVENOUS
  Filled 2024-06-19: qty 1

## 2024-06-19 MED ORDER — MEPERIDINE HCL 25 MG/ML IJ SOLN: 12.5000 mg | Freq: Once | INTRAMUSCULAR | Status: DC

## 2024-06-19 MED ORDER — CEFTRIAXONE SODIUM 2 G IJ SOLR: 2.0000 g | INTRAMUSCULAR | Status: DC

## 2024-06-19 MED ORDER — SODIUM CHLORIDE 0.9 % IV SOLN
100.0000 mg | Freq: Two times a day (BID) | INTRAVENOUS | Status: DC
  Administered 2024-06-19 – 2024-06-21 (×4): 100 mg via INTRAVENOUS
  Filled 2024-06-19 (×4): qty 100

## 2024-06-19 NOTE — Progress Notes (Signed)
 NAME:  VRINDA HECKSTALL, MRN:  [id number], DOB:  09/13/1932, LOS: 1 ADMISSION DATE:  06/18/2024, CONSULTATION DATE:  06/18/24 REFERRING MD: Dr. Alm Cave, CHIEF COMPLAINT: Sepsis  History of Present Illness:  89 y/o female with PMH with h/o HFpEF, TIA, mild dementia who presented from assisted living with AMS and rash on her hand.  Rash x 3-4 days, AMS since today.  She is not able to give any history, history from chart and husband at bedside.  Patient noted to have Shingles on left hand.  She became hypotensive and went into A fib.  He was started on Levophed and Amiodarone drip.  Patient is otherwise DNR/DNI and this is confirmed. ED labs include: Cr 1.38, HgB 10.5, Plt 148, LA 2.6->1.6.  Cxr showing left sided pneumonia.  Pertinent  Medical History   Past Medical History:  Diagnosis Date   Arthritis    BACK PAIN 07/18/2008   Bowel obstruction (HCC)    CONSTIPATION 10/02/2010   GERD 10/02/2010   Headache    IBS (irritable bowel syndrome)    LAMINECTOMY, LUMBAR, HX OF 06/16/2008   OSTEOARTHRITIS 10/02/2010   Pneumonia    RESTLESS LEG SYNDROME 10/02/2010   SACROILIAC JOINT DYSFUNCTION 06/16/2008   Sleep apnea    STYE 10/02/2010   TRANSIENT ISCHEMIC ATTACKS, HX OF 10/02/2010  Mild dementia TIA Heart failure with preserved ejection fraction  Significant Hospital Events: Including procedures, antibiotic start and stop dates in addition to other pertinent events   10/3-admitted with sepsis, requiring pressors  Interim History / Subjective:  Pressor requirement stable High flow nasal cannula in place  Objective    Blood pressure (!) 98/49, pulse (!) 146, temperature 99.9 F (37.7 C), temperature source Oral, resp. rate 18, height 5' 3.5 (1.613 m), weight 47 kg, SpO2 92%.    FiO2 (%):  [45 %] 45 %   Intake/Output Summary (Last 24 hours) at 06/19/2024 0854 Last data filed at 06/19/2024 0600 Gross per 24 hour  Intake 1568.22 ml  Output 300 ml  Net 1268.22 ml   Filed Weights    06/18/24 1630 06/19/24 0500  Weight: 59 kg 47 kg   Examination: General: Elderly, frail, does not appear to be in distress HENT: Moist oral mucosa Lungs: Decreased air movement bilaterally Cardiovascular: S1-S2 appreciated, irregularly irregular Abdomen: Soft, bowel sounds appreciated Extremities: Lower extremity edema, left hand rash-dressings in place Neuro: Awake and interactive GU:   I reviewed last 24 h vitals and pain scores, last 48 h intake and output, last 24 h labs and trends, and last 24 h imaging results.  Chest x-ray reviewed showing left upper lobe infiltrate bibasal infiltrate Resolved problem list   Assessment and Plan   Sepsis Lactic acidosis - On pressors - Broad-spectrum antibiotics - Cultures-no growth to date  Pneumonia - Continue antibiotics - On ceftriaxone, doxycycline   Urinary tract infection - Continue antibiotics-ceftriaxone should cover  Atrial fibrillation with RVR - On amiodarone drip  Left hand shingles - On antiviral-acyclovir - Contact isolation  Acute kidney injury - Avoid nephrotoxics - Maintain renal perfusion  Thrombocytopenia - Continue to monitor - Likely related to sepsis  Labs   CBC: Recent Labs  Lab 06/18/24 1643 06/18/24 1955 06/18/24 2003 06/19/24 0328  WBC 10.2 10.4  --  18.6*  NEUTROABS 8.5*  --   --   --   HGB 12.1 11.4* 10.5* 14.9  HCT 38.1 36.5 31.0* 46.5*  MCV 85.4 88.2  --  86.4  PLT 144* 148*  --  155    Basic Metabolic Panel: Recent Labs  Lab 06/18/24 1643 06/18/24 1955 06/18/24 2003  NA 139  --  140  K 3.6  --  3.2*  CL 102  --   --   CO2 26  --   --   GLUCOSE 241*  --   --   BUN 33*  --   --   CREATININE 1.43* 1.38*  --   CALCIUM  9.8  --   --    GFR: Estimated Creatinine Clearance: 20.1 mL/min (A) (by C-G formula based on SCr of 1.38 mg/dL (H)). Recent Labs  Lab 06/18/24 1643 06/18/24 1711 06/18/24 1955 06/18/24 2006 06/19/24 0328  WBC 10.2  --  10.4  --  18.6*  LATICACIDVEN   --  2.6*  --  1.6  --    Liver Function Tests: Recent Labs  Lab 06/18/24 1643  AST 62*  ALT 30  ALKPHOS 79  BILITOT 2.0*  PROT 5.7*  ALBUMIN 2.8*   No results for input(s): LIPASE, AMYLASE in the last 168 hours. No results for input(s): AMMONIA in the last 168 hours.  ABG    Component Value Date/Time   PHART 7.511 (H) 06/18/2024 2003   PCO2ART 33.8 06/18/2024 2003   PO2ART 75 (L) 06/18/2024 2003   HCO3 27.1 06/18/2024 2003   TCO2 28 06/18/2024 2003   O2SAT 96 06/18/2024 2003     Coagulation Profile: No results for input(s): INR, PROTIME in the last 168 hours.  Cardiac Enzymes: No results for input(s): CKTOTAL, CKMB, CKMBINDEX, TROPONINI in the last 168 hours.  HbA1C: Hgb A1c MFr Bld  Date/Time Value Ref Range Status  10/10/2023 12:52 PM 5.1 4.6 - 6.5 % Final    Comment:    Glycemic Control Guidelines for People with Diabetes:Non Diabetic:  <6%Goal of Therapy: <7%Additional Action Suggested:  >8%    CBG: Recent Labs  Lab 06/18/24 2038  GLUCAP 179*    Review of Systems:   No specific complaints, feels poorly overall  Past Medical History:  She,  has a past medical history of Arthritis, BACK PAIN (07/18/2008), Bowel obstruction (HCC), CONSTIPATION (10/02/2010), GERD (10/02/2010), Headache, IBS (irritable bowel syndrome), LAMINECTOMY, LUMBAR, HX OF (06/16/2008), OSTEOARTHRITIS (10/02/2010), Pneumonia, RESTLESS LEG SYNDROME (10/02/2010), SACROILIAC JOINT DYSFUNCTION (06/16/2008), Sleep apnea, STYE (10/02/2010), and TRANSIENT ISCHEMIC ATTACKS, HX OF (10/02/2010).   Surgical History:   Past Surgical History:  Procedure Laterality Date   ABDOMINAL HYSTERECTOMY  1979   TAH, fibroids   APOGEE / PERIGEE REPAIR  2005   APPENDECTOMY  1941   EYE SURGERY  2005   catarac,both eyes   LIVER BIOPSY  1997   benign, hemangioma resection   SPINE SURGERY  2006   laminectomy     Social History:   reports that she has never smoked. She has never used smokeless  tobacco. She reports current alcohol use. She reports that she does not use drugs.   Family History:  Her family history includes Breast cancer in her paternal aunt; Diabetes in her father; Pancreatitis in her brother; Stroke in her father. There is no history of Colon cancer, Stomach cancer, Rectal cancer, Liver cancer, or Esophageal cancer.   Allergies Allergies  Allergen Reactions   Penicillins Hives, Rash and Other (See Comments)    Blister with PCN    The patient is critically ill with multiple organ systems failure and requires high complexity decision making for assessment and support, frequent evaluation and titration of therapies, application of advanced monitoring technologies  and extensive interpretation of multiple databases. Critical Care Time devoted to patient care services described in this note independent of APP/resident time (if applicable)  is 33 minutes.   Jennet Epley MD [facility] Personal pager: See Amion If unanswered, please page CCM On-call: #[phone]

## 2024-06-19 NOTE — Progress Notes (Signed)
  Echocardiogram 2D Echocardiogram has been performed.  Briana Fitzgerald 06/19/2024, 12:55 PM

## 2024-06-19 NOTE — Progress Notes (Addendum)
 Progress Note  Patient Name: Briana Fitzgerald Date of Encounter: 06/19/2024  Primary Cardiologist: Oneil Parchment, MD   Subjective   Doing well today. Patient reports a history of falls and frailty.   Inpatient Medications    Scheduled Meds:  Chlorhexidine Gluconate Cloth  6 each Topical Daily   heparin  5,000 Units Subcutaneous Q8H   ipratropium  0.5 mg Nebulization Q6H   Continuous Infusions:  acyclovir Stopped (06/18/24 2205)   amiodarone 60 mg/hr (06/19/24 0713)   lactated ringers 50 mL/hr at 06/19/24 0600   norepinephrine (LEVOPHED) Adult infusion 4 mcg/min (06/19/24 0600)   PRN Meds: acetaminophen , docusate sodium , levalbuterol **AND** ipratropium, polyethylene glycol   Vital Signs    Vitals:   06/19/24 0530 06/19/24 0545 06/19/24 0600 06/19/24 0756  BP: (!) 90/47 (!) 98/51 (!) 98/49   Pulse: (!) 141 (!) 142 (!) 146   Resp: 18 19 18    Temp:    99.9 F (37.7 C)  TempSrc:    Oral  SpO2: 96% 91% 92%   Weight:      Height:        Intake/Output Summary (Last 24 hours) at 06/19/2024 0849 Last data filed at 06/19/2024 0600 Gross per 24 hour  Intake 1568.22 ml  Output 300 ml  Net 1268.22 ml   Filed Weights   06/18/24 1630 06/19/24 0500  Weight: 59 kg 47 kg    Telemetry    Afib with RVR, appears to be in atrial flutter since around 8:45 this morning - Personally Reviewed  ECG    Afib with RVR with ST depressions anterolaterally - Personally Reviewed  Physical Exam   GEN: No acute distress.   Neck: mild JVD Cardiac: regular rhythm, normal rate, no murmurs, rubs, or gallops.  Respiratory: Clear to auscultation bilaterally. HFNC GI: Soft, nontender, non-distended  MS: 1+ bilateral lower extremity edema; No deformity. Neuro:  Nonfocal  Psych: Normal affect   Labs    Chemistry Recent Labs  Lab 06/18/24 1643 06/18/24 1955 06/18/24 2003  NA 139  --  140  K 3.6  --  3.2*  CL 102  --   --   CO2 26  --   --   GLUCOSE 241*  --   --   BUN 33*  --    --   CREATININE 1.43* 1.38*  --   CALCIUM  9.8  --   --   PROT 5.7*  --   --   ALBUMIN 2.8*  --   --   AST 62*  --   --   ALT 30  --   --   ALKPHOS 79  --   --   BILITOT 2.0*  --   --   GFRNONAA 35* 36*  --   ANIONGAP 11  --   --      Hematology Recent Labs  Lab 06/18/24 1643 06/18/24 1955 06/18/24 2003 06/19/24 0328  WBC 10.2 10.4  --  18.6*  RBC 4.46 4.14  --  5.38*  HGB 12.1 11.4* 10.5* 14.9  HCT 38.1 36.5 31.0* 46.5*  MCV 85.4 88.2  --  86.4  MCH 27.1 27.5  --  27.7  MCHC 31.8 31.2  --  32.0  RDW 15.0 15.2  --  15.5  PLT 144* 148*  --  155    Cardiac EnzymesNo results for input(s): TROPONINI in the last 168 hours. No results for input(s): TROPIPOC in the last 168 hours.   BNPNo results for input(s): BNP,  PROBNP in the last 168 hours.   DDimer No results for input(s): DDIMER in the last 168 hours.   Radiology    CT HEAD WO CONTRAST ( ) Result Date: 06/18/2024 EXAM: CT HEAD WITHOUT CONTRAST 06/18/2024 10:23:04 PM TECHNIQUE: CT of the head was performed without the administration of intravenous contrast. Automated exposure control, iterative reconstruction, and/or weight based adjustment of the mA/kV was utilized to reduce the radiation dose to as low as reasonably achievable. COMPARISON: 04/04/2024 CLINICAL HISTORY: Mental status change, unknown cause. FINDINGS: BRAIN AND VENTRICLES: Chronic ischemic changes and mild volume loss. No acute hemorrhage, acute infarct, hydrocephalus, extra-axial collection, mass effect, or midline shift. ORBITS: No acute abnormality. SINUSES: No acute abnormality. SOFT TISSUES AND SKULL: No acute soft tissue abnormality. No skull fracture. IMPRESSION: 1. No acute intracranial abnormality. 2. Chronic ischemic microvascular changes with mild cerebral volume loss. Electronically signed by: Franky Stanford MD 06/18/2024 10:28 PM EDT RP Workstation: [id number]   DG Chest Port 1 View Result Date: 06/18/2024 CLINICAL DATA:  Fever. EXAM:  PORTABLE CHEST 1 VIEW COMPARISON:  May 27, 2018. FINDINGS: Mild cardiomegaly is noted. Bibasilar opacities are noted concerning for pneumonia. Left upper lobe opacity is noted which may represent pneumonia, but underlying neoplasm or malignancy cannot be excluded. Left perihilar prominence is noted concerning for possible mass. CT scan of the chest is recommended for further evaluation. Postsurgical changes are seen involving proximal left humerus. IMPRESSION: 1. Left upper lobe opacity is noted which may represent pneumonia, but underlying neoplasm or malignancy cannot be excluded. Left perihilar prominence is noted concerning for possible mass. CT scan of the chest is recommended for further evaluation. 2. Bibasilar opacities are noted concerning for pneumonia. Electronically Signed   By: Lynwood Landy Raddle M.D.   On: 06/18/2024 17:57    Cardiac Studies   Echo 06/24/23:   1. Left ventricular ejection fraction, by estimation, is 55 to 60%. The  left ventricle has normal function. The left ventricle has no regional  wall motion abnormalities. Left ventricular diastolic parameters are  indeterminate.   2. Right ventricular systolic function is normal. The right ventricular  size is normal. There is normal pulmonary artery systolic pressure.   3. Left atrial size was mild to moderately dilated.   4. Right atrial size was mildly dilated.   5. The mitral valve is normal in structure. Trivial mitral valve  regurgitation. No evidence of mitral stenosis. Severe mitral annular  calcification.   6. The aortic valve is tricuspid. There is moderate calcification of the  aortic valve. There is moderate thickening of the aortic valve. Aortic  valve regurgitation is trivial. Aortic valve sclerosis/calcification is  present, without any evidence of  aortic stenosis.   7. Aortic dilatation noted. There is borderline dilatation of the  ascending aorta, measuring 38 mm.   8. The inferior vena cava is normal  in size with greater than 50%  respiratory variability, suggesting right atrial pressure of 3 mmHg.   Patient Profile     89 y.o. female who is nonambulatory and living in a nursing facility with a history of HFpEF managed with diuretics, TIA, aortic root dilation, mild coronary calcifications on CT, aortoiliac atherosclerotic calcifications, splenomegaly who presented on 10/3 with fever, hypotension, AMS, rash on left hand and septic shock and atrial fibrillation with RVR which developed in the emergency room. Initially treated with IV diltiazem but then developed hypotension. She was then treated with norepinephrine and IV amiodarone with IV fluids. Cardiology consulted for new atrial  fibrillation management.   Assessment & Plan   Principal Problem:   Cardiac arrest [facility]) Active Problems:   Sepsis (HCC)   Septic shock Continues on levophed and received antibiotics New atrial fibrillation with RVR, now appears to be flutter on telemetry CHA2DS2-VASc: 7 (CHF, stroke, vascular disease, age, gender) On amiodarone drip  Appears to be becoming volume overloaded, careful hydration advised Echo ordered ECG to confirm flutter Will start on renally dosed Eliquis 2.5 mg BID (age, weight). Patient is at risk of complications and this may need to be revisited prior to discharge but while in the hospital will anticoagulate Shingles of left hand UTI AKI Lactic acidosis, resolved Normocytic anemia Thrombocytopenia Chronic HFpEF Hypokalemia Asymptomatic Coronary/aortic atherosclerosis Splenomegaly Dilated ascending aorta *Cardiac arrest on problem list but she has not had a cardiac arrest this hospitalization*   Critical care time 35 minutes  For questions or updates, please contact [facility] Please consult [email or web address] for contact info under        Signed, Emeline FORBES Calender, MD  06/19/2024, 8:49 AM

## 2024-06-19 NOTE — Progress Notes (Signed)
 RT called to bedside after pts SpO2 dropped to the low 70. Upon assessment, pt is on HHFNC but mouth breathing and educated via this RN and RT to breath via nose. Pt continues to breath via mouth and placed on a simple mask and SpO2 back to normal values.

## 2024-06-19 NOTE — Progress Notes (Signed)
 PHARMACY - PHYSICIAN COMMUNICATION CRITICAL VALUE ALERT - BLOOD CULTURE IDENTIFICATION (BCID)  Briana Fitzgerald is an 89 y.o. female who presented to [facility] on [facility]/18/2024 with a chief complaint of sepsis  Assessment:  L-hand shingles on Acyclovir. C/o Pneumonia and UTI on Ceftriaxone + Doxycycline . 1 bottle of blood cultures with GVR. BCID was negative.   Name of physician (or Provider) Contacted: Dr. Neda  Current antibiotics: Ceftiraxone, Doxycycline   Changes to prescribed antibiotics recommended:  Patient is on recommended antibiotics - No changes needed  Results for orders placed or performed during the hospital encounter of 06/18/24  Blood Culture ID Panel (Reflexed) (Collected: 06/18/2024  5:18 PM)  Result Value Ref Range   Enterococcus faecalis NOT DETECTED NOT DETECTED   Enterococcus Faecium NOT DETECTED NOT DETECTED   Listeria monocytogenes NOT DETECTED NOT DETECTED   Staphylococcus species NOT DETECTED NOT DETECTED   Staphylococcus aureus (BCID) NOT DETECTED NOT DETECTED   Staphylococcus epidermidis NOT DETECTED NOT DETECTED   Staphylococcus lugdunensis NOT DETECTED NOT DETECTED   Streptococcus species NOT DETECTED NOT DETECTED   Streptococcus agalactiae NOT DETECTED NOT DETECTED   Streptococcus pneumoniae NOT DETECTED NOT DETECTED   Streptococcus pyogenes NOT DETECTED NOT DETECTED   A.calcoaceticus-baumannii NOT DETECTED NOT DETECTED   Bacteroides fragilis NOT DETECTED NOT DETECTED   Enterobacterales NOT DETECTED NOT DETECTED   Enterobacter cloacae complex NOT DETECTED NOT DETECTED   Escherichia coli NOT DETECTED NOT DETECTED   Klebsiella aerogenes NOT DETECTED NOT DETECTED   Klebsiella oxytoca NOT DETECTED NOT DETECTED   Klebsiella pneumoniae NOT DETECTED NOT DETECTED   Proteus species NOT DETECTED NOT DETECTED   Salmonella species NOT DETECTED NOT DETECTED   Serratia marcescens NOT DETECTED NOT DETECTED   Haemophilus influenzae NOT DETECTED NOT DETECTED    Neisseria meningitidis NOT DETECTED NOT DETECTED   Pseudomonas aeruginosa NOT DETECTED NOT DETECTED   Stenotrophomonas maltophilia NOT DETECTED NOT DETECTED   Candida albicans NOT DETECTED NOT DETECTED   Candida auris NOT DETECTED NOT DETECTED   Candida glabrata NOT DETECTED NOT DETECTED   Candida krusei NOT DETECTED NOT DETECTED   Candida parapsilosis NOT DETECTED NOT DETECTED   Candida tropicalis NOT DETECTED NOT DETECTED   Cryptococcus neoformans/gattii NOT DETECTED NOT DETECTED    Ciro Harlene Daring 06/19/2024  1:17 PM

## 2024-06-20 DIAGNOSIS — I4891 Unspecified atrial fibrillation: Secondary | ICD-10-CM | POA: Diagnosis not present

## 2024-06-20 DIAGNOSIS — A419 Sepsis, unspecified organism: Secondary | ICD-10-CM | POA: Diagnosis not present

## 2024-06-20 DIAGNOSIS — I469 Cardiac arrest, cause unspecified: Secondary | ICD-10-CM | POA: Diagnosis not present

## 2024-06-20 DIAGNOSIS — N390 Urinary tract infection, site not specified: Secondary | ICD-10-CM | POA: Diagnosis not present

## 2024-06-20 DIAGNOSIS — E8729 Other acidosis: Secondary | ICD-10-CM | POA: Diagnosis not present

## 2024-06-20 LAB — BASIC METABOLIC PANEL WITH GFR
Anion gap: 14 (ref 5–15)
BUN: 31 mg/dL — ABNORMAL HIGH (ref 8–23)
CO2: 22 mmol/L (ref 22–32)
Calcium: 9.2 mg/dL (ref 8.9–10.3)
Chloride: 98 mmol/L (ref 98–111)
Creatinine, Ser: 1.2 mg/dL — ABNORMAL HIGH (ref 0.44–1.00)
GFR, Estimated: 43 mL/min — ABNORMAL LOW (ref >60)
Glucose, Bld: 127 mg/dL — ABNORMAL HIGH (ref 70–99)
Potassium: 3.8 mmol/L (ref 3.5–5.1)
Sodium: 134 mmol/L — ABNORMAL LOW (ref 135–145)

## 2024-06-20 LAB — CBC
HCT: 41.9 % (ref 36.0–46.0)
Hemoglobin: 13.5 g/dL (ref 12.0–15.0)
MCH: 27.5 pg (ref 26.0–34.0)
MCHC: 32.2 g/dL (ref 30.0–36.0)
MCV: 85.3 fL (ref 80.0–100.0)
Platelets: 158 K/uL (ref 150–400)
RBC: 4.91 MIL/uL (ref 3.87–5.11)
RDW: 15.2 % (ref 11.5–15.5)
WBC: 13.3 K/uL — ABNORMAL HIGH (ref 4.0–10.5)
nRBC: 0 % (ref 0.0–0.2)

## 2024-06-20 LAB — MAGNESIUM: Magnesium: 1.9 mg/dL (ref 1.7–2.4)

## 2024-06-20 LAB — PHOSPHORUS: Phosphorus: 3 mg/dL (ref 2.5–4.6)

## 2024-06-20 MED ORDER — DEXMEDETOMIDINE HCL IN NACL 400 MCG/100ML IV SOLN
0.0000 ug/kg/h | INTRAVENOUS | Status: DC
  Administered 2024-06-20: 0.6 ug/kg/h via INTRAVENOUS
  Filled 2024-06-20 (×2): qty 100

## 2024-06-20 MED ORDER — DEXTROSE 5 % IV SOLN
10.0000 mg/kg | INTRAVENOUS | Status: DC
  Administered 2024-06-20: 480 mg via INTRAVENOUS
  Filled 2024-06-20 (×2): qty 9.6

## 2024-06-20 MED ORDER — MAGNESIUM SULFATE 2 GM/50ML IV SOLN: 2.0000 g | Freq: Once | INTRAVENOUS | Status: DC

## 2024-06-20 MED ORDER — MELATONIN 3 MG PO TABS
3.0000 mg | ORAL_TABLET | Freq: Every day | ORAL | Status: DC
  Administered 2024-06-20 – 2024-07-04 (×15): 3 mg via ORAL
  Filled 2024-06-20 (×16): qty 1

## 2024-06-20 MED ORDER — DEXMEDETOMIDINE HCL IN NACL 400 MCG/100ML IV SOLN
INTRAVENOUS | Status: AC
  Administered 2024-06-20: 0.4 ug/kg/h via INTRAVENOUS
  Filled 2024-06-20: qty 100

## 2024-06-20 NOTE — Progress Notes (Signed)
 Progress Note  Patient Name: Briana Fitzgerald Date of Encounter: 06/20/2024  Primary Cardiologist: Oneil Parchment, MD   Subjective   Delirious overnight with worsening tachycardia requiring precedex  Inpatient Medications    Scheduled Meds:  apixaban  2.5 mg Oral BID   Chlorhexidine Gluconate Cloth  6 each Topical Daily   ipratropium  0.5 mg Nebulization Q6H   Continuous Infusions:  acyclovir Stopped (06/19/24 2337)   amiodarone 60 mg/hr (06/20/24 0700)   cefTRIAXone (ROCEPHIN)  IV Stopped (06/19/24 1646)   dexmedetomidine (PRECEDEX) IV infusion 0.6 mcg/kg/hr (06/20/24 0700)   doxycycline  (VIBRAMYCIN ) IV 100 mg (06/20/24 0533)   lactated ringers 50 mL/hr at 06/20/24 0700   norepinephrine (LEVOPHED) Adult infusion 8 mcg/min (06/20/24 0700)   PRN Meds: acetaminophen , docusate sodium , levalbuterol **AND** ipratropium, polyethylene glycol   Vital Signs    Vitals:   06/20/24 0615 06/20/24 0630 06/20/24 0645 06/20/24 0700  BP: (!) 72/36 (!) 77/51 96/70 (!) 83/42  Pulse: (!) 128 (!) 152 (!) 107   Resp: (!) 23 (!) 22 (!) 21   Temp:      TempSrc:      SpO2: 93% 92% 96%   Weight:      Height:        Intake/Output Summary (Last 24 hours) at 06/20/2024 0743 Last data filed at 06/20/2024 0700 Gross per 24 hour  Intake 3156.32 ml  Output 925 ml  Net 2231.32 ml   Filed Weights   06/18/24 1630 06/19/24 0500 06/20/24 0500  Weight: 59 kg 47 kg 48 kg    Telemetry    Out of flutter and back in Afib with RVR - Personally Reviewed  ECG    Afib with RVR with ST depressions anterolaterally - Personally Reviewed  Physical Exam   GEN: sleeping with mittens   Cardiac: irregular rhythm, tachycardic rate  Respiratory: Clear to auscultation bilaterally. oxymask GI: non-distended  MS: No deformity. Neuro:  Nonfocal  Psych: sleeping  Labs    Chemistry Recent Labs  Lab 06/18/24 1643 06/18/24 1955 06/19/24 0945 06/19/24 1705 06/20/24 0321  NA 139   < > 137 140 134*   K 3.6   < > 3.4* 3.2* 3.8  CL 102  --  103 102 98  CO2 26  --  25 25 22   GLUCOSE 241*  --  151* 110* 127*  BUN 33*  --  31* 32* 31*  CREATININE 1.43*   < > 1.40* 0.44 1.20*  CALCIUM  9.8  --  9.5 8.8* 9.2  PROT 5.7*  --   --   --   --   ALBUMIN 2.8*  --   --   --   --   AST 62*  --   --   --   --   ALT 30  --   --   --   --   ALKPHOS 79  --   --   --   --   BILITOT 2.0*  --   --   --   --   GFRNONAA 35*   < > 36* >60 43*  ANIONGAP 11  --  9 13 14    < > = values in this interval not displayed.     Hematology Recent Labs  Lab 06/18/24 1955 06/18/24 2003 06/19/24 0328 06/20/24 0321  WBC 10.4  --  18.6* 13.3*  RBC 4.14  --  5.38* 4.91  HGB 11.4* 10.5* 14.9 13.5  HCT 36.5 31.0* 46.5* 41.9  MCV 88.2  --  86.4 85.3  MCH 27.5  --  27.7 27.5  MCHC 31.2  --  32.0 32.2  RDW 15.2  --  15.5 15.2  PLT 148*  --  155 158    Cardiac EnzymesNo results for input(s): TROPONINI in the last 168 hours. No results for input(s): TROPIPOC in the last 168 hours.   BNPNo results for input(s): BNP, PROBNP in the last 168 hours.   DDimer No results for input(s): DDIMER in the last 168 hours.   Radiology    ECHOCARDIOGRAM COMPLETE Result Date: 06/19/2024    ECHOCARDIOGRAM REPORT   Patient Name:   Briana Fitzgerald Date of Exam: 06/19/2024 Medical Rec #:  [id number]          Height:       63.5 in Accession #:    [id number]         Weight:       103.6 lb Date of Birth:  Apr 07, 1932           BSA:          1.471 m Patient Age:    89 years           BP:           98/49 mmHg Patient Gender: F                  HR:           128 bpm. Exam Location:  Inpatient Procedure: 2D Echo, Cardiac Doppler and Color Doppler (Both Spectral and Color            Flow Doppler were utilized during procedure). Indications:     Atrial Fibrillation I48.91  History:         Patient has prior history of Echocardiogram examinations, most                  recent 06/24/2023. CAD and hx of cardiac arrest,                   Signs/Symptoms:Shortness of Breath and Dyspnea; Risk                  Factors:Sleep Apnea, Dyslipidemia and Hypertension.  Sonographer:     Koleen Popper RDCS Referring Phys:  [id number] LEITA JONELLE EINSTEIN Diagnosing Phys: Diannah Late Mallipeddi  Sonographer Comments: Image acquisition challenging due to patient body habitus and Image acquisition challenging due to respiratory motion. IMPRESSIONS  1. Poor Echo images.  2. Challenging study in the setting of tachycardia, HR 135 bpm.  3. Left ventricular ejection fraction, by estimation, is 50 to 55%. The left ventricle has low normal function. The left ventricle has no regional wall motion abnormalities. There is mild left ventricular hypertrophy. Left ventricular diastolic function  could not be evaluated.  4. Right ventricular systolic function is normal. The right ventricular size is mildly enlarged. There is normal pulmonary artery systolic pressure.  5. The mitral valve is abnormal. Mild mitral valve regurgitation. No evidence of mitral stenosis. Severe mitral annular calcification.  6. The aortic valve was not well visualized. There is moderate calcification of the aortic valve. Aortic valve regurgitation is not visualized. No aortic stenosis is present. Aortic valve mean gradient measures 3.0 mmHg.  7. The inferior vena cava is dilated in size with >50% respiratory variability, suggesting right atrial pressure of 8 mmHg. Comparison(s): No significant change from prior study. FINDINGS  Left Ventricle: Left ventricular ejection fraction, by estimation, is 50 to 55%. The  left ventricle has low normal function. The left ventricle has no regional wall motion abnormalities. Strain was performed and the global longitudinal strain is indeterminate. The left ventricular internal cavity size was normal in size. There is mild left ventricular hypertrophy. Left ventricular diastolic function could not be evaluated due to atrial fibrillation. Left ventricular diastolic  function could not be evaluated. Right Ventricle: The right ventricular size is mildly enlarged. No increase in right ventricular wall thickness. Right ventricular systolic function is normal. There is normal pulmonary artery systolic pressure. The tricuspid regurgitant velocity is 2.26  m/s, and with an assumed right atrial pressure of 8 mmHg, the estimated right ventricular systolic pressure is 28.4 mmHg. Left Atrium: Left atrial size was normal in size. Right Atrium: Right atrial size was normal in size. Pericardium: There is no evidence of pericardial effusion. Mitral Valve: The mitral valve is abnormal. Severe mitral annular calcification. Mild mitral valve regurgitation. No evidence of mitral valve stenosis. Tricuspid Valve: The tricuspid valve is normal in structure. Tricuspid valve regurgitation is mild . No evidence of tricuspid stenosis. Aortic Valve: The aortic valve was not well visualized. There is moderate calcification of the aortic valve. Aortic valve regurgitation is not visualized. No aortic stenosis is present. Aortic valve mean gradient measures 3.0 mmHg. Aortic valve peak gradient measures 5.5 mmHg. Aortic valve area, by VTI measures 1.73 cm. Pulmonic Valve: The pulmonic valve was normal in structure. Pulmonic valve regurgitation is mild. No evidence of pulmonic stenosis. Aorta: The aortic root and ascending aorta are structurally normal, with no evidence of dilitation. Venous: The inferior vena cava is dilated in size with greater than 50% respiratory variability, suggesting right atrial pressure of 8 mmHg. IAS/Shunts: No atrial level shunt detected by color flow Doppler. Additional Comments: 3D was performed not requiring image post processing on an independent workstation and was indeterminate.  LEFT VENTRICLE PLAX 2D LVIDd:         3.86 cm LVIDs:         2.81 cm LV PW:         1.22 cm LV IVS:        1.35 cm LVOT diam:     1.83 cm LV SV:         29 LV SV Index:   20 LVOT Area:     2.63 cm   LV Volumes (MOD) LV vol d, MOD A2C: 52.5 ml LV vol s, MOD A2C: 23.7 ml LV SV MOD A2C:     28.8 ml RIGHT VENTRICLE             IVC RV Basal diam:  4.08 cm     IVC diam: 2.36 cm RV Mid diam:    3.34 cm RV S prime:     16.50 cm/s TAPSE (M-mode): 1.4 cm LEFT ATRIUM             Index        RIGHT ATRIUM           Index LA diam:        3.47 cm 2.36 cm/m   RA Area:     15.70 cm LA Vol (A2C):   38.5 ml 26.18 ml/m  RA Volume:   41.70 ml  28.35 ml/m LA Vol (A4C):   46.4 ml 31.55 ml/m LA Biplane Vol: 44.8 ml 30.46 ml/m  AORTIC VALVE AV Area (Vmax):    1.91 cm AV Area (Vmean):   1.81 cm AV Area (VTI):     1.73 cm  AV Vmax:           117.00 cm/s AV Vmean:          80.200 cm/s AV VTI:            0.170 m AV Peak Grad:      5.5 mmHg AV Mean Grad:      3.0 mmHg LVOT Vmax:         85.00 cm/s LVOT Vmean:        55.300 cm/s LVOT VTI:          0.112 m LVOT/AV VTI ratio: 0.66  AORTA Ao Root diam: 3.14 cm Ao Asc diam:  3.83 cm TRICUSPID VALVE TR Peak grad:   20.4 mmHg TR Vmax:        226.00 cm/s  SHUNTS Systemic VTI:  0.11 m Systemic Diam: 1.83 cm Vishnu Priya Mallipeddi Electronically signed by Diannah Late Mallipeddi Signature Date/Time: 06/19/2024/1:12:41 PM    Final (Updated)    CT HEAD WO CONTRAST ( ) Result Date: 06/18/2024 EXAM: CT HEAD WITHOUT CONTRAST 06/18/2024 10:23:04 PM TECHNIQUE: CT of the head was performed without the administration of intravenous contrast. Automated exposure control, iterative reconstruction, and/or weight based adjustment of the mA/kV was utilized to reduce the radiation dose to as low as reasonably achievable. COMPARISON: 04/04/2024 CLINICAL HISTORY: Mental status change, unknown cause. FINDINGS: BRAIN AND VENTRICLES: Chronic ischemic changes and mild volume loss. No acute hemorrhage, acute infarct, hydrocephalus, extra-axial collection, mass effect, or midline shift. ORBITS: No acute abnormality. SINUSES: No acute abnormality. SOFT TISSUES AND SKULL: No acute soft tissue abnormality. No  skull fracture. IMPRESSION: 1. No acute intracranial abnormality. 2. Chronic ischemic microvascular changes with mild cerebral volume loss. Electronically signed by: Franky Stanford MD 06/18/2024 10:28 PM EDT RP Workstation: [id number]   DG Chest Port 1 View Result Date: 06/18/2024 CLINICAL DATA:  Fever. EXAM: PORTABLE CHEST 1 VIEW COMPARISON:  May 27, 2018. FINDINGS: Mild cardiomegaly is noted. Bibasilar opacities are noted concerning for pneumonia. Left upper lobe opacity is noted which may represent pneumonia, but underlying neoplasm or malignancy cannot be excluded. Left perihilar prominence is noted concerning for possible mass. CT scan of the chest is recommended for further evaluation. Postsurgical changes are seen involving proximal left humerus. IMPRESSION: 1. Left upper lobe opacity is noted which may represent pneumonia, but underlying neoplasm or malignancy cannot be excluded. Left perihilar prominence is noted concerning for possible mass. CT scan of the chest is recommended for further evaluation. 2. Bibasilar opacities are noted concerning for pneumonia. Electronically Signed   By: Lynwood Landy Raddle M.D.   On: 06/18/2024 17:57    Cardiac Studies   Echo 06/19/24:   1. Poor Echo images.   2. Challenging study in the setting of tachycardia, HR 135 bpm.   3. Left ventricular ejection fraction, by estimation, is 50 to 55%. The  left ventricle has low normal function. The left ventricle has no regional  wall motion abnormalities. There is mild left ventricular hypertrophy.  Left ventricular diastolic function   could not be evaluated.   4. Right ventricular systolic function is normal. The right ventricular  size is mildly enlarged. There is normal pulmonary artery systolic  pressure.   5. The mitral valve is abnormal. Mild mitral valve regurgitation. No  evidence of mitral stenosis. Severe mitral annular calcification.   6. The aortic valve was not well visualized. There is moderate   calcification of the aortic valve. Aortic valve regurgitation is not  visualized. No aortic stenosis  is present. Aortic valve mean gradient  measures 3.0 mmHg.   7. The inferior vena cava is dilated in size with >50% respiratory  variability, suggesting right atrial pressure of 8 mmHg.   Comparison(s): No significant change from prior study.     Patient Profile     89 y.o. female who is nonambulatory and living in a nursing facility with a history of HFpEF managed with diuretics, TIA, aortic root dilation, mild coronary calcifications on CT, aortoiliac atherosclerotic calcifications, splenomegaly who presented on 10/3 with fever, hypotension, AMS, rash on left hand and septic shock and atrial fibrillation with RVR which developed in the emergency room. Initially treated with IV diltiazem but then developed hypotension. She was then treated with norepinephrine and IV amiodarone with IV fluids. Cardiology consulted for new atrial fibrillation management.   Assessment & Plan   Principal Problem:   Cardiac arrest [facility]) Active Problems:   Sepsis (HCC)   Septic shock Continues on levophed and received antibiotics New atrial fibrillation/flutter with RVR CHA2DS2-VASc: 7 (CHF, stroke, vascular disease, age, gender) On amiodarone drip  Careful hydration advised Started on renally dosed Eliquis 2.5 mg BID (age, weight). Patient is at risk of complications (fall risk, frail) and this may need to be revisited prior to discharge but while in the hospital will anticoagulate Can consider TEE/DCCV prior to discharge once clinically stable Shingles of left hand UTI AKI Lactic acidosis, resolved Normocytic anemia Thrombocytopenia Chronic HFpEF Hypokalemia Asymptomatic Coronary/aortic atherosclerosis Splenomegaly Dilated ascending aorta *Cardiac arrest on problem list but she has not had a cardiac arrest this hospitalization*   Critical care time 31 minutes  For questions or updates,  please contact [facility] Please consult [email or web address] for contact info under        Signed, Emeline FORBES Calender, MD  06/20/2024, 7:43 AM

## 2024-06-20 NOTE — Progress Notes (Signed)
 Gram variable rods cultures  Will repeat blood cultures

## 2024-06-20 NOTE — Progress Notes (Signed)
 Pharmacy Antibiotic Note  Briana Fitzgerald is a 89 y.o. female for which pharmacy has been consulted for acyclovir dosing for shingles. Patient presenting with AMS and vesicular lesions of left hand. Patient noted to be in AF RVR in the ED.  Significant weight change - 59>>47.5 kg. Adjusting dosing.  AKI noted - SCr bumped >> increasing IVF per discussion with MD WBC 10.4; LA 1.6; T 101.9; HR 124; RR 18 COVID neg / flu neg  Plan: Reduce Acyclovir to 10 mg/kg (480 mg) q24h --increasing LR to 100 ml/hr while on acyclovir Transition to PO therapy when appropriate Monitor WBC, fever, renal function, cultures De-escalate when able  Height: 5' 3.5 (161.3 cm) Weight: 48 kg (105 lb 13.1 oz) IBW/kg (Calculated) : 53.55  Temp (24hrs), Avg:98.6 F (37 C), Min:97.5 F (36.4 C), Max:99.5 F (37.5 C)  Recent Labs  Lab 06/18/24 1643 06/18/24 1711 06/18/24 1955 06/18/24 2006 06/19/24 0328 06/19/24 0945 06/19/24 1705 06/20/24 0321  WBC 10.2  --  10.4  --  18.6*  --   --  13.3*  CREATININE 1.43*  --  1.38*  --   --  1.40* 0.44 1.20*  LATICACIDVEN  --  2.6*  --  1.6  --   --   --   --     Estimated Creatinine Clearance: 23.6 mL/min (A) (by C-G formula based on SCr of 1.2 mg/dL (H)).    Allergies  Allergen Reactions   Penicillins Hives, Rash and Other (See Comments)    Blister with PCN   Microbiology results: Pending  Thank you for allowing pharmacy to be a part of this patient's care.  Harlene Boga, PharmD, BCPS, BCCCP Clinical Pharmacist Please refer to [facility] for [facility] numbers 06/20/2024 9:32 AM

## 2024-06-20 NOTE — Progress Notes (Signed)
 NAME:  Briana Fitzgerald, MRN:  [id number], DOB:  1932-03-13, LOS: 2 ADMISSION DATE:  06/18/2024, CONSULTATION DATE:  06/18/24 REFERRING MD: Dr. Alm Cave, CHIEF COMPLAINT: Sepsis  History of Present Illness:  89 y/o female with PMH with h/o HFpEF, TIA, mild dementia who presented from assisted living with AMS and rash on her hand.  Rash x 3-4 days, AMS since today.  She is not able to give any history, history from chart and husband at bedside.  Patient noted to have Shingles on left hand.  She became hypotensive and went into A fib.  He was started on Levophed and Amiodarone drip.  Patient is otherwise DNR/DNI and this is confirmed. ED labs include: Cr 1.38, HgB 10.5, Plt 148, LA 2.6->1.6.  Cxr showing left sided pneumonia.  Pertinent  Medical History   Past Medical History:  Diagnosis Date   Arthritis    BACK PAIN 07/18/2008   Bowel obstruction (HCC)    CONSTIPATION 10/02/2010   GERD 10/02/2010   Headache    IBS (irritable bowel syndrome)    LAMINECTOMY, LUMBAR, HX OF 06/16/2008   OSTEOARTHRITIS 10/02/2010   Pneumonia    RESTLESS LEG SYNDROME 10/02/2010   SACROILIAC JOINT DYSFUNCTION 06/16/2008   Sleep apnea    STYE 10/02/2010   TRANSIENT ISCHEMIC ATTACKS, HX OF 10/02/2010  Mild dementia TIA Heart failure with preserved ejection fraction  Significant Hospital Events: Including procedures, antibiotic start and stop dates in addition to other pertinent events   10/3-admitted with sepsis, requiring pressors 10/5-delirious  Interim History / Subjective:  Delirium overnight Had to be started on Precedex  Objective    Blood pressure (!) 83/42, pulse (!) 107, temperature (!) 97.5 F (36.4 C), temperature source Axillary, resp. rate (!) 21, height 5' 3.5 (1.613 m), weight 48 kg, SpO2 95%.    FiO2 (%):  [60 %-100 %] 100 %   Intake/Output Summary (Last 24 hours) at 06/20/2024 0913 Last data filed at 06/20/2024 0700 Gross per 24 hour  Intake 2952.81 ml  Output 925 ml  Net 2027.81 ml    Filed Weights   06/18/24 1630 06/19/24 0500 06/20/24 0500  Weight: 59 kg 47 kg 48 kg   Examination: General: Elderly, does not appear to be in distress HENT: Moist oral mucosa Lungs: Decreased air movement bilaterally Cardiovascular: S1-S2 appreciated, irregularly irregular Abdomen: Soft, bowel sounds appreciated Extremities: Lower extremity edema, left hand rash-dressings in place Neuro: Awake and interactive GU:   I reviewed last 24 h vitals and pain scores, last 48 h intake and output, last 24 h labs and trends, and last 24 h imaging results. Chest x-ray showing left upper lobe infiltrate, bibasilar infiltrate  Resolved problem list   Assessment and Plan   Sepsis Lactic acidosis - Lactic acidosis resolved - Remains on pressors -Continues on antibiotics -Culture shows no growth  Atrial fibrillation with RVR - Remains on amiodarone drip - Started on anticoagulation-Eliquis  Delirium - On Precedex - Start on melatonin tonight  Pneumonia - Continue antibiotics - On ceftriaxone and doxycycline   Urinary tract infection - Will continue ceftriaxone  Shingles on the left hand - Continue acyclovir - Continue contact isolation  Acute kidney injury - Avoid nephrotoxic medications - Maintain renal perfusion - Increase fluids  Thrombocytopenia - Stable   Labs   CBC: Recent Labs  Lab 06/18/24 1643 06/18/24 1955 06/18/24 2003 06/19/24 0328 06/20/24 0321  WBC 10.2 10.4  --  18.6* 13.3*  NEUTROABS 8.5*  --   --   --   --  HGB 12.1 11.4* 10.5* 14.9 13.5  HCT 38.1 36.5 31.0* 46.5* 41.9  MCV 85.4 88.2  --  86.4 85.3  PLT 144* 148*  --  155 158    Basic Metabolic Panel: Recent Labs  Lab 06/18/24 1643 06/18/24 1955 06/18/24 2003 06/19/24 0945 06/19/24 1705 06/20/24 0321  NA 139  --  140 137 140 134*  K 3.6  --  3.2* 3.4* 3.2* 3.8  CL 102  --   --  103 102 98  CO2 26  --   --  25 25 22   GLUCOSE 241*  --   --  151* 110* 127*  BUN 33*  --   --  31*  32* 31*  CREATININE 1.43* 1.38*  --  1.40* 0.44 1.20*  CALCIUM  9.8  --   --  9.5 8.8* 9.2  MG  --   --   --  1.6*  --  1.9  PHOS  --   --   --   --   --  3.0   GFR: Estimated Creatinine Clearance: 23.6 mL/min (A) (by C-G formula based on SCr of 1.2 mg/dL (H)). Recent Labs  Lab 06/18/24 1643 06/18/24 1711 06/18/24 1955 06/18/24 2006 06/19/24 0328 06/20/24 0321  WBC 10.2  --  10.4  --  18.6* 13.3*  LATICACIDVEN  --  2.6*  --  1.6  --   --    Liver Function Tests: Recent Labs  Lab 06/18/24 1643  AST 62*  ALT 30  ALKPHOS 79  BILITOT 2.0*  PROT 5.7*  ALBUMIN 2.8*   No results for input(s): LIPASE, AMYLASE in the last 168 hours. No results for input(s): AMMONIA in the last 168 hours.  ABG    Component Value Date/Time   PHART 7.511 (H) 06/18/2024 2003   PCO2ART 33.8 06/18/2024 2003   PO2ART 75 (L) 06/18/2024 2003   HCO3 27.1 06/18/2024 2003   TCO2 28 06/18/2024 2003   O2SAT 96 06/18/2024 2003     Coagulation Profile: No results for input(s): INR, PROTIME in the last 168 hours.  Cardiac Enzymes: No results for input(s): CKTOTAL, CKMB, CKMBINDEX, TROPONINI in the last 168 hours.  HbA1C: Hgb A1c MFr Bld  Date/Time Value Ref Range Status  10/10/2023 12:52 PM 5.1 4.6 - 6.5 % Final    Comment:    Glycemic Control Guidelines for People with Diabetes:Non Diabetic:  <6%Goal of Therapy: <7%Additional Action Suggested:  >8%    CBG: Recent Labs  Lab 06/18/24 2038  GLUCAP 179*    Review of Systems:   No specific complaints, feels poorly overall  Past Medical History:  She,  has a past medical history of Arthritis, BACK PAIN (07/18/2008), Bowel obstruction (HCC), CONSTIPATION (10/02/2010), GERD (10/02/2010), Headache, IBS (irritable bowel syndrome), LAMINECTOMY, LUMBAR, HX OF (06/16/2008), OSTEOARTHRITIS (10/02/2010), Pneumonia, RESTLESS LEG SYNDROME (10/02/2010), SACROILIAC JOINT DYSFUNCTION (06/16/2008), Sleep apnea, STYE (10/02/2010), and TRANSIENT ISCHEMIC  ATTACKS, HX OF (10/02/2010).   Surgical History:   Past Surgical History:  Procedure Laterality Date   ABDOMINAL HYSTERECTOMY  1979   TAH, fibroids   APOGEE / PERIGEE REPAIR  2005   APPENDECTOMY  1941   EYE SURGERY  2005   catarac,both eyes   LIVER BIOPSY  1997   benign, hemangioma resection   SPINE SURGERY  2006   laminectomy     Social History:   reports that she has never smoked. She has never used smokeless tobacco. She reports current alcohol use. She reports that she does not use  drugs.   Family History:  Her family history includes Breast cancer in her paternal aunt; Diabetes in her father; Pancreatitis in her brother; Stroke in her father. There is no history of Colon cancer, Stomach cancer, Rectal cancer, Liver cancer, or Esophageal cancer.   Allergies Allergies  Allergen Reactions   Penicillins Hives, Rash and Other (See Comments)    Blister with PCN   The patient is critically ill with multiple organ systems failure and requires high complexity decision making for assessment and support, frequent evaluation and titration of therapies, application of advanced monitoring technologies and extensive interpretation of multiple databases. Critical Care Time devoted to patient care services described in this note independent of APP/resident time (if applicable)  is 32 minutes.   Jennet Epley MD [facility] Personal pager: See Amion If unanswered, please page CCM On-call: #[phone]

## 2024-06-20 NOTE — Progress Notes (Addendum)
 eLink Physician-Brief Progress Note Patient Name: Briana Fitzgerald DOB: [birth year] MRN: [id number]   Date of Service  06/20/2024  HPI/Events of Note   Bladder scan showed >500  eICU Interventions  Retention protocol IO cath PRN   [phone] -persistent delirium, anxious and disoriented with worsening tachycardia, stable blood pressure on low-dose norepinephrine.  Add low-dose Precedex  0557 -despite 0.5 mcg of Precedex, the patient is still actively trying to get out of bed, agitated delirium, pulling at tubes and lines.  Mitts will be placed.  Extend Precedex ceiling to standard signature  Intervention Category Minor Interventions: Routine modifications to care plan (e.g. PRN medications for pain, fever)  Robyne Matar 06/20/2024, 3:22 AM

## 2024-06-21 DIAGNOSIS — J189 Pneumonia, unspecified organism: Secondary | ICD-10-CM | POA: Diagnosis not present

## 2024-06-21 DIAGNOSIS — I480 Paroxysmal atrial fibrillation: Secondary | ICD-10-CM | POA: Diagnosis not present

## 2024-06-21 DIAGNOSIS — J9601 Acute respiratory failure with hypoxia: Secondary | ICD-10-CM

## 2024-06-21 DIAGNOSIS — R001 Bradycardia, unspecified: Secondary | ICD-10-CM

## 2024-06-21 DIAGNOSIS — R6521 Severe sepsis with septic shock: Secondary | ICD-10-CM | POA: Diagnosis not present

## 2024-06-21 DIAGNOSIS — A419 Sepsis, unspecified organism: Secondary | ICD-10-CM | POA: Diagnosis not present

## 2024-06-21 LAB — RENAL FUNCTION PANEL
Albumin: 1.7 g/dL — ABNORMAL LOW (ref 3.5–5.0)
Anion gap: 11 (ref 5–15)
BUN: 29 mg/dL — ABNORMAL HIGH (ref 8–23)
CO2: 23 mmol/L (ref 22–32)
Calcium: 9.3 mg/dL (ref 8.9–10.3)
Chloride: 99 mmol/L (ref 98–111)
Creatinine, Ser: 0.99 mg/dL (ref 0.44–1.00)
GFR, Estimated: 54 mL/min — ABNORMAL LOW (ref >60)
Glucose, Bld: 123 mg/dL — ABNORMAL HIGH (ref 70–99)
Phosphorus: 3.4 mg/dL (ref 2.5–4.6)
Potassium: 3.6 mmol/L (ref 3.5–5.1)
Sodium: 133 mmol/L — ABNORMAL LOW (ref 135–145)

## 2024-06-21 LAB — CULTURE, BLOOD (ROUTINE X 2)

## 2024-06-21 LAB — CBC
HCT: 38 % (ref 36.0–46.0)
Hemoglobin: 12.5 g/dL (ref 12.0–15.0)
MCH: 27.1 pg (ref 26.0–34.0)
MCHC: 32.9 g/dL (ref 30.0–36.0)
MCV: 82.3 fL (ref 80.0–100.0)
Platelets: 178 K/uL (ref 150–400)
RBC: 4.62 MIL/uL (ref 3.87–5.11)
RDW: 15.3 % (ref 11.5–15.5)
WBC: 11.2 K/uL — ABNORMAL HIGH (ref 4.0–10.5)
nRBC: 0 % (ref 0.0–0.2)

## 2024-06-21 LAB — MAGNESIUM: Magnesium: 1.7 mg/dL (ref 1.7–2.4)

## 2024-06-21 MED ORDER — POTASSIUM CHLORIDE CRYS ER 20 MEQ PO TBCR
40.0000 meq | EXTENDED_RELEASE_TABLET | Freq: Once | ORAL | Status: AC
  Administered 2024-06-21: 40 meq via ORAL
  Filled 2024-06-21: qty 2

## 2024-06-21 MED ORDER — CEFTRIAXONE SODIUM 2 G IJ SOLR
2.0000 g | INTRAMUSCULAR | Status: AC
  Administered 2024-06-21 – 2024-06-24 (×4): 2 g via INTRAVENOUS
  Filled 2024-06-21 (×4): qty 20

## 2024-06-21 MED ORDER — ENSURE PLUS HIGH PROTEIN PO LIQD
237.0000 mL | Freq: Two times a day (BID) | ORAL | Status: DC
Start: 2024-06-21 — End: 2024-07-05
  Administered 2024-06-21 – 2024-07-05 (×28): 237 mL via ORAL

## 2024-06-21 MED ORDER — MAGNESIUM SULFATE 2 GM/50ML IV SOLN
2.0000 g | Freq: Once | INTRAVENOUS | Status: AC
  Administered 2024-06-21: 2 g via INTRAVENOUS
  Filled 2024-06-21: qty 50

## 2024-06-21 MED ORDER — ORAL CARE MOUTH RINSE: 15.0000 mL | OROMUCOSAL | Status: DC | PRN

## 2024-06-21 MED ORDER — AMIODARONE HCL 200 MG PO TABS
100.0000 mg | ORAL_TABLET | Freq: Every day | ORAL | Status: DC
Start: 2024-06-21 — End: 2024-06-23
  Administered 2024-06-21 – 2024-06-23 (×3): 100 mg via ORAL
  Filled 2024-06-21 (×3): qty 1

## 2024-06-21 MED ORDER — MIDODRINE HCL 5 MG PO TABS
10.0000 mg | ORAL_TABLET | Freq: Three times a day (TID) | ORAL | Status: DC
Start: 2024-06-21 — End: 2024-06-28
  Administered 2024-06-21 – 2024-06-28 (×21): 10 mg via ORAL
  Filled 2024-06-21 (×22): qty 2

## 2024-06-21 MED ORDER — VALACYCLOVIR HCL 500 MG PO TABS
1000.0000 mg | ORAL_TABLET | Freq: Every day | ORAL | Status: AC
  Administered 2024-06-21 – 2024-06-24 (×4): 1000 mg via ORAL
  Filled 2024-06-21 (×4): qty 2

## 2024-06-21 MED ORDER — QUETIAPINE 12.5 MG HALF TABLET
25.0000 mg | ORAL_TABLET | Freq: Every day | ORAL | Status: DC
  Administered 2024-06-21 – 2024-07-04 (×14): 25 mg via ORAL
  Filled 2024-06-21 (×3): qty 2
  Filled 2024-06-21: qty 1
  Filled 2024-06-21: qty 2
  Filled 2024-06-21: qty 1
  Filled 2024-06-21 (×2): qty 2
  Filled 2024-06-21: qty 1
  Filled 2024-06-21 (×4): qty 2
  Filled 2024-06-21: qty 1

## 2024-06-21 NOTE — Evaluation (Signed)
 Clinical/Bedside Swallow Evaluation Patient Details  Name: Briana Fitzgerald MRN: [id number] Date of Birth: 07-May-1932  Today's Date: 06/21/2024 Time: SLP Start Time (ACUTE ONLY): 1345 SLP Stop Time (ACUTE ONLY): 1400 SLP Time Calculation (min) (ACUTE ONLY): 15 min  Past Medical History:  Past Medical History:  Diagnosis Date   Arthritis    BACK PAIN 07/18/2008   Bowel obstruction (HCC)    CONSTIPATION 10/02/2010   GERD 10/02/2010   Headache    IBS (irritable bowel syndrome)    LAMINECTOMY, LUMBAR, HX OF 06/16/2008   OSTEOARTHRITIS 10/02/2010   Pneumonia    RESTLESS LEG SYNDROME 10/02/2010   SACROILIAC JOINT DYSFUNCTION 06/16/2008   Sleep apnea    STYE 10/02/2010   TRANSIENT ISCHEMIC ATTACKS, HX OF 10/02/2010   Past Surgical History:  Past Surgical History:  Procedure Laterality Date   ABDOMINAL HYSTERECTOMY  1979   TAH, fibroids   APOGEE / PERIGEE REPAIR  2005   APPENDECTOMY  1941   EYE SURGERY  2005   catarac,both eyes   LIVER BIOPSY  1997   benign, hemangioma resection   SPINE SURGERY  2006   laminectomy   HPI:  Pt is a 89 year old female admitted from assisted living with AMS, shingles and A-fib found to have bacterial pna. Has had difficulty eating and drinking due to pain. PMH with h/o HFpEF, TIA, mild dementia    Assessment / Plan / Recommendation  Clinical Impression  Pt on HHFNC, has tolerated thin liquid diet, though has complained of pain with eating, possibly gesturing to her sternum. Under observation pt is thirsty, drinks from a straw with multiple swallows, no coughing or wet vocal quality. Prefers room temperature water. Tolerates puree well, but when offered a fig newton pt says youll have to break it in half. SLP offered a small bite and pt had prolonged mastication and requested some applesauce to help her and a liquid wash. Even with cookie broken up in puree pt had prolonged mastication and residual particulates. Recommend puree and thin liquids given oral  deficits and vague concern for esophageal dysphagia in advanced age. Pt uses appropriate strategies and has some awareness of safety.  SLP Visit Diagnosis: Dysphagia, oral phase (R13.11)    Aspiration Risk  Risk for inadequate nutrition/hydration    Diet Recommendation Dysphagia 1 (Puree);Thin liquid    Liquid Administration via: Cup;Straw Medication Administration: Crushed with puree Supervision: Patient able to self feed Compensations: Slow rate;Small sips/bites;Follow solids with liquid Postural Changes: Seated upright at 90 degrees    Other  Recommendations Oral Care Recommendations: Oral care BID     Assistance Recommended at Discharge    Functional Status Assessment    Frequency and Duration            Prognosis        Swallow Study   General HPI: Pt is a 89 year old female admitted from assisted living with AMS, shingles and A-fib found to have bacterial pna. Has had difficulty eating and drinking due to pain. PMH with h/o HFpEF, TIA, mild dementia Type of Study: Bedside Swallow Evaluation Diet Prior to this Study: Thin liquids (Level 0) Temperature Spikes Noted: No Respiratory Status: Other (comment) (HHFNC) History of Recent Intubation: No Behavior/Cognition: Alert;Cooperative;Pleasant mood;Confused Oral Cavity Assessment: Within Functional Limits Oral Care Completed by SLP: No Oral Cavity - Dentition: Edentulous Vision: Functional for self-feeding Self-Feeding Abilities: Able to feed self Patient Positioning: Upright in bed Baseline Vocal Quality: Normal Volitional Cough: Cognitively unable to elicit Volitional  Swallow: Able to elicit    Oral/Motor/Sensory Function Overall Oral Motor/Sensory Function: Within functional limits   Ice Chips     Thin Liquid Thin Liquid: Within functional limits Presentation: Straw    Nectar Thick Nectar Thick Liquid: Not tested   Honey Thick Honey Thick Liquid: Not tested   Puree Puree: Within functional  limits Presentation: Spoon   Solid     Solid: Impaired Oral Phase Impairments: Impaired mastication Oral Phase Functional Implications: Oral residue;Impaired mastication;Prolonged oral transit      Shameeka Silliman, Consuelo Fitch 06/21/2024,2:40 PM

## 2024-06-21 NOTE — Progress Notes (Signed)
 NAME:  Briana Fitzgerald, MRN:  [id number], DOB:  02-01-1932, LOS: 3 ADMISSION DATE:  06/18/2024, CONSULTATION DATE:  06/18/24 REFERRING MD: Dr. Alm Cave, CHIEF COMPLAINT: Sepsis  History of Present Illness:  89 y/o female with PMH with h/o HFpEF, TIA, mild dementia who presented from assisted living with AMS and rash on her hand.  Rash x 3-4 days, AMS since today.  She is not able to give any history, history from chart and husband at bedside.  Patient noted to have Shingles on left hand.  She became hypotensive and went into A fib.  He was started on Levophed and Amiodarone drip.  Patient is otherwise DNR/DNI and this is confirmed. ED labs include: Cr 1.38, HgB 10.5, Plt 148, LA 2.6->1.6.  Cxr showing left sided pneumonia.  Pertinent  Medical History   Past Medical History:  Diagnosis Date   Arthritis    BACK PAIN 07/18/2008   Bowel obstruction (HCC)    CONSTIPATION 10/02/2010   GERD 10/02/2010   Headache    IBS (irritable bowel syndrome)    LAMINECTOMY, LUMBAR, HX OF 06/16/2008   OSTEOARTHRITIS 10/02/2010   Pneumonia    RESTLESS LEG SYNDROME 10/02/2010   SACROILIAC JOINT DYSFUNCTION 06/16/2008   Sleep apnea    STYE 10/02/2010   TRANSIENT ISCHEMIC ATTACKS, HX OF 10/02/2010  Mild dementia TIA Heart failure with preserved ejection fraction  Significant Hospital Events: Including procedures, antibiotic start and stop dates in addition to other pertinent events   10/3-admitted with sepsis, requiring pressors 10/5-delirious  Interim History / Subjective:  Still on 2mcg norepinephrine Delirious on precedex Thirsty and unable to self feed Saying she wants to eat more but is having pain  Objective    Blood pressure (!) 98/56, pulse (!) 47, temperature (!) 97.4 F (36.3 C), temperature source Axillary, resp. rate (!) 33, height 5' 3.5 (1.613 m), weight 49 kg, SpO2 100%.    FiO2 (%):  [80 %-100 %] 80 %   Intake/Output Summary (Last 24 hours) at 06/21/2024 0941 Last data filed at  06/21/2024 0900 Gross per 24 hour  Intake 4140.25 ml  Output 1000 ml  Net 3140.25 ml   Filed Weights   06/19/24 0500 06/20/24 0500 06/21/24 0500  Weight: 47 kg 48 kg 49 kg   Examination: Elderly, frail, chronically ill appearing On HHFNC Breathing non labored, shallow, no wheeze No edema Thin skin Shingles on hands Delirious but redirectable Very weak and moves all 4 extremities  I reviewed last 24 h vitals and pain scores, last 48 h intake and output, last 24 h labs and trends, and last 24 h imaging results. Chest x-ray showing left upper lobe infiltrate, bibasilar infiltrate  Resolved problem list   Assessment and Plan   Acute hypoxemic respiratory failure Septic shock secondary to  M. Catarrhalis bacteremia CAP secondary to M. Catarrhalis - continue ceftriaxone total 7 day course - wean vasopressors as tolerated. Currently on 2 mcg norepi - currently on HHFNC. Will discuss weaning with RT to salter  Atrial fibrillation with RVR Now with bradycardia which is likely mediating her hypotension - Remains on amiodarone drip - will transition to po since she has received 5g load.  - Started on anticoagulation-Eliquis - cardiology following, appreciate recommendations  Delirium - On Precedex 0.1 today for agitated deliurm. Likely also driving bradycardia EKG reviewed, QRS is . Will start seroquel.  - continue melatonin at bedtime  Shingles on the left hand - Continue acyclovir - will transition to oral and stop  MIVF - Continue contact and airborne isolation  Acute kidney injury - Avoid nephrotoxic medications - Maintain renal perfusion - Cr improved to 0.99  Thrombocytopenia, sepsis mediated - improving  The patient is critically ill due to septic shock.  Critical care was necessary to treat or prevent imminent or life-threatening deterioration.  Critical care was time spent personally by me on the following activities: development of treatment plan with  patient and/or surrogate as well as nursing, discussions with consultants, evaluation of patient's response to treatment, examination of patient, obtaining history from patient or surrogate, ordering and performing treatments and interventions, ordering and review of laboratory studies, ordering and review of radiographic studies, pulse oximetry, re-evaluation of patient's condition and participation in multidisciplinary rounds.   Critical Care Time devoted to patient care services described in this note is 45 minutes. This time reflects time of care of this signee Neko Mcgeehan S Obediah Welles . This critical care time does not reflect separately billable procedures or procedure time, teaching time or supervisory time of PA/NP/Med student/Med Resident etc but could involve care discussion time.       Verdon GORMAN [facility] 06/21/2024 9:48 AM  Pager: see AMION  If no response to pager , please call critical care on call (see AMION) until 7pm After 7:00 pm call Elink     Labs   CBC: Recent Labs  Lab 06/18/24 1643 06/18/24 1955 06/18/24 2003 06/19/24 0328 06/20/24 0321 06/21/24 0337  WBC 10.2 10.4  --  18.6* 13.3* 11.2*  NEUTROABS 8.5*  --   --   --   --   --   HGB 12.1 11.4* 10.5* 14.9 13.5 12.5  HCT 38.1 36.5 31.0* 46.5* 41.9 38.0  MCV 85.4 88.2  --  86.4 85.3 82.3  PLT 144* 148*  --  155 158 178    Basic Metabolic Panel: Recent Labs  Lab 06/18/24 1643 06/18/24 1955 06/18/24 2003 06/19/24 0945 06/19/24 1705 06/20/24 0321 06/21/24 0337  NA 139  --  140 137 140 134* 133*  K 3.6  --  3.2* 3.4* 3.2* 3.8 3.6  CL 102  --   --  103 102 98 99  CO2 26  --   --  25 25 22 23   GLUCOSE 241*  --   --  151* 110* 127* 123*  BUN 33*  --   --  31* 32* 31* 29*  CREATININE 1.43* 1.38*  --  1.40* 0.44 1.20* 0.99  CALCIUM  9.8  --   --  9.5 8.8* 9.2 9.3  MG  --   --   --  1.6*  --  1.9 1.7  PHOS  --   --   --   --   --  3.0 3.4   GFR: Estimated Creatinine  Clearance: 29.2 mL/min (by C-G formula based on SCr of 0.99 mg/dL). Recent Labs  Lab 06/18/24 1711 06/18/24 1955 06/18/24 2006 06/19/24 0328 06/20/24 0321 06/21/24 0337  WBC  --  10.4  --  18.6* 13.3* 11.2*  LATICACIDVEN 2.6*  --  1.6  --   --   --    Liver Function Tests: Recent Labs  Lab 06/18/24 1643 06/21/24 0337  AST 62*  --   ALT 30  --   ALKPHOS 79  --   BILITOT 2.0*  --   PROT 5.7*  --   ALBUMIN 2.8* 1.7*   No results for input(s): LIPASE, AMYLASE in the last 168 hours. No results for  input(s): AMMONIA in the last 168 hours.  ABG    Component Value Date/Time   PHART 7.511 (H) 06/18/2024 2003   PCO2ART 33.8 06/18/2024 2003   PO2ART 75 (L) 06/18/2024 2003   HCO3 27.1 06/18/2024 2003   TCO2 28 06/18/2024 2003   O2SAT 96 06/18/2024 2003     Coagulation Profile: No results for input(s): INR, PROTIME in the last 168 hours.  Cardiac Enzymes: No results for input(s): CKTOTAL, CKMB, CKMBINDEX, TROPONINI in the last 168 hours.  HbA1C: Hgb A1c MFr Bld  Date/Time Value Ref Range Status  10/10/2023 12:52 PM 5.1 4.6 - 6.5 % Final    Comment:    Glycemic Control Guidelines for People with Diabetes:Non Diabetic:  <6%Goal of Therapy: <7%Additional Action Suggested:  >8%    CBG: Recent Labs  Lab 06/18/24 2038  GLUCAP 179*

## 2024-06-21 NOTE — Progress Notes (Signed)
 [facility] ADULT ICU REPLACEMENT PROTOCOL   The patient does apply for the [facility] Adult ICU Electrolyte Replacment Protocol based on the criteria listed below:   1.Exclusion criteria: TCTS, ECMO, Dialysis, and Myasthenia Gravis patients 2. Is GFR >/= 30 ml/min? Yes.    Patient's GFR today is 54 3. Is SCr </= 2? Yes.   Patient's SCr is 0.99 mg/dL 4. Did SCr increase >/= 0.5 in 24 hours? No. 5.Pt's weight >40kg  Yes.   6. Abnormal electrolyte(s): K, Mag  7. Electrolytes replaced per protocol 8.  Call MD STAT for K+ </= 2.5, Phos </= 1, or Mag </= 1 Physician:  Haze Hunter BRAVO Elaina Cara 06/21/2024 6:13 AM

## 2024-06-21 NOTE — Progress Notes (Signed)
 Progress Note  Patient Name: Briana Fitzgerald Date of Encounter: 06/21/2024  Primary Cardiologist: Oneil Parchment, MD   Subjective   No very alert   Inpatient Medications    Scheduled Meds:  apixaban  2.5 mg Oral BID   Chlorhexidine Gluconate Cloth  6 each Topical Daily   melatonin  3 mg Oral QHS   Continuous Infusions:  acyclovir Stopped (06/20/24 2222)   amiodarone 30 mg/hr (06/21/24 0800)   cefTRIAXone (ROCEPHIN)  IV Stopped (06/20/24 1513)   dexmedetomidine (PRECEDEX) IV infusion 0.3 mcg/kg/hr (06/21/24 0800)   doxycycline  (VIBRAMYCIN ) IV Stopped (06/21/24 0730)   lactated ringers 100 mL/hr at 06/21/24 0800   magnesium sulfate bolus IVPB     norepinephrine (LEVOPHED) Adult infusion 2 mcg/min (06/21/24 0800)   PRN Meds: acetaminophen , docusate sodium , levalbuterol **AND** [DISCONTINUED] ipratropium, polyethylene glycol   Vital Signs    Vitals:   06/21/24 0745 06/21/24 0809 06/21/24 0815 06/21/24 0830  BP: 110/61 (!) 111/58 (!) 107/58 (!) 108/57  Pulse: (!) 44  (!) 46 (!) 45  Resp: 16 16 15 15   Temp:      TempSrc:      SpO2: 97%  97% 98%  Weight:      Height:        Intake/Output Summary (Last 24 hours) at 06/21/2024 0841 Last data filed at 06/21/2024 0800 Gross per 24 hour  Intake 4125.18 ml  Output 1000 ml  Net 3125.18 ml   Filed Weights   06/19/24 0500 06/20/24 0500 06/21/24 0500  Weight: 47 kg 48 kg 49 kg    Telemetry    Afib rates ok   ECG    Afib with RVR with ST depressions anterolaterally - Personally Reviewed  Physical Exam   Elderly female Bruising on arms Distant Heart Sounds Abdomen benign No edema Lungs clear anteriorly   Labs    Chemistry Recent Labs  Lab 06/18/24 1643 06/18/24 1955 06/19/24 1705 06/20/24 0321 06/21/24 0337  NA 139   < > 140 134* 133*  K 3.6   < > 3.2* 3.8 3.6  CL 102   < > 102 98 99  CO2 26   < > 25 22 23   GLUCOSE 241*   < > 110* 127* 123*  BUN 33*   < > 32* 31* 29*  CREATININE 1.43*   < >  0.44 1.20* 0.99  CALCIUM  9.8   < > 8.8* 9.2 9.3  PROT 5.7*  --   --   --   --   ALBUMIN 2.8*  --   --   --  1.7*  AST 62*  --   --   --   --   ALT 30  --   --   --   --   ALKPHOS 79  --   --   --   --   BILITOT 2.0*  --   --   --   --   GFRNONAA 35*   < > >60 43* 54*  ANIONGAP 11   < > 13 14 11    < > = values in this interval not displayed.     Hematology Recent Labs  Lab 06/19/24 0328 06/20/24 0321 06/21/24 0337  WBC 18.6* 13.3* 11.2*  RBC 5.38* 4.91 4.62  HGB 14.9 13.5 12.5  HCT 46.5* 41.9 38.0  MCV 86.4 85.3 82.3  MCH 27.7 27.5 27.1  MCHC 32.0 32.2 32.9  RDW 15.5 15.2 15.3  PLT 155 158 178    Cardiac  EnzymesNo results for input(s): TROPONINI in the last 168 hours. No results for input(s): TROPIPOC in the last 168 hours.   BNPNo results for input(s): BNP, PROBNP in the last 168 hours.   DDimer No results for input(s): DDIMER in the last 168 hours.   Radiology    ECHOCARDIOGRAM COMPLETE Result Date: 06/19/2024    ECHOCARDIOGRAM REPORT   Patient Name:   Briana Fitzgerald Date of Exam: 06/19/2024 Medical Rec #:  [id number]          Height:       63.5 in Accession #:    [id number]         Weight:       103.6 lb Date of Birth:  01-Aug-1932           BSA:          1.471 m Patient Age:    89 years           BP:           98/49 mmHg Patient Gender: F                  HR:           128 bpm. Exam Location:  Inpatient Procedure: 2D Echo, Cardiac Doppler and Color Doppler (Both Spectral and Color            Flow Doppler were utilized during procedure). Indications:     Atrial Fibrillation I48.91  History:         Patient has prior history of Echocardiogram examinations, most                  recent 06/24/2023. CAD and hx of cardiac arrest,                  Signs/Symptoms:Shortness of Breath and Dyspnea; Risk                  Factors:Sleep Apnea, Dyslipidemia and Hypertension.  Sonographer:     Koleen Popper RDCS Referring Phys:  [id number] LEITA JONELLE EINSTEIN Diagnosing Phys: Diannah Late Mallipeddi  Sonographer Comments: Image acquisition challenging due to patient body habitus and Image acquisition challenging due to respiratory motion. IMPRESSIONS  1. Poor Echo images.  2. Challenging study in the setting of tachycardia, HR 135 bpm.  3. Left ventricular ejection fraction, by estimation, is 50 to 55%. The left ventricle has low normal function. The left ventricle has no regional wall motion abnormalities. There is mild left ventricular hypertrophy. Left ventricular diastolic function  could not be evaluated.  4. Right ventricular systolic function is normal. The right ventricular size is mildly enlarged. There is normal pulmonary artery systolic pressure.  5. The mitral valve is abnormal. Mild mitral valve regurgitation. No evidence of mitral stenosis. Severe mitral annular calcification.  6. The aortic valve was not well visualized. There is moderate calcification of the aortic valve. Aortic valve regurgitation is not visualized. No aortic stenosis is present. Aortic valve mean gradient measures 3.0 mmHg.  7. The inferior vena cava is dilated in size with >50% respiratory variability, suggesting right atrial pressure of 8 mmHg. Comparison(s): No significant change from prior study. FINDINGS  Left Ventricle: Left ventricular ejection fraction, by estimation, is 50 to 55%. The left ventricle has low normal function. The left ventricle has no regional wall motion abnormalities. Strain was performed and the global longitudinal strain is indeterminate. The left ventricular internal cavity size was normal in size. There is mild  left ventricular hypertrophy. Left ventricular diastolic function could not be evaluated due to atrial fibrillation. Left ventricular diastolic function could not be evaluated. Right Ventricle: The right ventricular size is mildly enlarged. No increase in right ventricular wall thickness. Right ventricular systolic function is normal. There is normal pulmonary artery  systolic pressure. The tricuspid regurgitant velocity is 2.26  m/s, and with an assumed right atrial pressure of 8 mmHg, the estimated right ventricular systolic pressure is 28.4 mmHg. Left Atrium: Left atrial size was normal in size. Right Atrium: Right atrial size was normal in size. Pericardium: There is no evidence of pericardial effusion. Mitral Valve: The mitral valve is abnormal. Severe mitral annular calcification. Mild mitral valve regurgitation. No evidence of mitral valve stenosis. Tricuspid Valve: The tricuspid valve is normal in structure. Tricuspid valve regurgitation is mild . No evidence of tricuspid stenosis. Aortic Valve: The aortic valve was not well visualized. There is moderate calcification of the aortic valve. Aortic valve regurgitation is not visualized. No aortic stenosis is present. Aortic valve mean gradient measures 3.0 mmHg. Aortic valve peak gradient measures 5.5 mmHg. Aortic valve area, by VTI measures 1.73 cm. Pulmonic Valve: The pulmonic valve was normal in structure. Pulmonic valve regurgitation is mild. No evidence of pulmonic stenosis. Aorta: The aortic root and ascending aorta are structurally normal, with no evidence of dilitation. Venous: The inferior vena cava is dilated in size with greater than 50% respiratory variability, suggesting right atrial pressure of 8 mmHg. IAS/Shunts: No atrial level shunt detected by color flow Doppler. Additional Comments: 3D was performed not requiring image post processing on an independent workstation and was indeterminate.  LEFT VENTRICLE PLAX 2D LVIDd:         3.86 cm LVIDs:         2.81 cm LV PW:         1.22 cm LV IVS:        1.35 cm LVOT diam:     1.83 cm LV SV:         29 LV SV Index:   20 LVOT Area:     2.63 cm  LV Volumes (MOD) LV vol d, MOD A2C: 52.5 ml LV vol s, MOD A2C: 23.7 ml LV SV MOD A2C:     28.8 ml RIGHT VENTRICLE             IVC RV Basal diam:  4.08 cm     IVC diam: 2.36 cm RV Mid diam:    3.34 cm RV S prime:     16.50 cm/s  TAPSE (M-mode): 1.4 cm LEFT ATRIUM             Index        RIGHT ATRIUM           Index LA diam:        3.47 cm 2.36 cm/m   RA Area:     15.70 cm LA Vol (A2C):   38.5 ml 26.18 ml/m  RA Volume:   41.70 ml  28.35 ml/m LA Vol (A4C):   46.4 ml 31.55 ml/m LA Biplane Vol: 44.8 ml 30.46 ml/m  AORTIC VALVE AV Area (Vmax):    1.91 cm AV Area (Vmean):   1.81 cm AV Area (VTI):     1.73 cm AV Vmax:           117.00 cm/s AV Vmean:          80.200 cm/s AV VTI:  0.170 m AV Peak Grad:      5.5 mmHg AV Mean Grad:      3.0 mmHg LVOT Vmax:         85.00 cm/s LVOT Vmean:        55.300 cm/s LVOT VTI:          0.112 m LVOT/AV VTI ratio: 0.66  AORTA Ao Root diam: 3.14 cm Ao Asc diam:  3.83 cm TRICUSPID VALVE TR Peak grad:   20.4 mmHg TR Vmax:        226.00 cm/s  SHUNTS Systemic VTI:  0.11 m Systemic Diam: 1.83 cm Vishnu Priya Mallipeddi Electronically signed by Diannah Late Mallipeddi Signature Date/Time: 06/19/2024/1:12:41 PM    Final (Updated)     Cardiac Studies   Echo 06/19/24:   1. Poor Echo images.   2. Challenging study in the setting of tachycardia, HR 135 bpm.   3. Left ventricular ejection fraction, by estimation, is 50 to 55%. The  left ventricle has low normal function. The left ventricle has no regional  wall motion abnormalities. There is mild left ventricular hypertrophy.  Left ventricular diastolic function   could not be evaluated.   4. Right ventricular systolic function is normal. The right ventricular  size is mildly enlarged. There is normal pulmonary artery systolic  pressure.   5. The mitral valve is abnormal. Mild mitral valve regurgitation. No  evidence of mitral stenosis. Severe mitral annular calcification.   6. The aortic valve was not well visualized. There is moderate  calcification of the aortic valve. Aortic valve regurgitation is not  visualized. No aortic stenosis is present. Aortic valve mean gradient  measures 3.0 mmHg.   7. The inferior vena cava is dilated in  size with >50% respiratory  variability, suggesting right atrial pressure of 8 mmHg.   Comparison(s): No significant change from prior study.     Patient Profile     89 y.o. female who is nonambulatory and living in a nursing facility with a history of HFpEF managed with diuretics, TIA, aortic root dilation, mild coronary calcifications on CT, aortoiliac atherosclerotic calcifications, splenomegaly who presented on 10/3 with fever, hypotension, AMS, rash on left hand and septic shock and atrial fibrillation with RVR which developed in the emergency room. Initially treated with IV diltiazem but then developed hypotension. She was then treated with norepinephrine and IV amiodarone with IV fluids. Cardiology consulted for new atrial fibrillation management.   Assessment & Plan   Principal Problem:   Cardiac arrest [facility]) Active Problems:   Sepsis (HCC)   Septic shock Continues on levophed and received antibiotics New atrial fibrillation/flutter with RVR CHA2DS2-VASc: 7 (CHF, stroke, vascular disease, age, gender) On amiodarone drip  Careful hydration advised Started on renally dosed Eliquis 2.5 mg BID (age, weight). Patient is at risk of complications (fall risk, frail) and this may need to be revisited prior to discharge but while in the hospital will anticoagulate Not clear she is a good candidate for chronic anticoagulation  Shingles of left hand UTI AKI Lactic acidosis, resolved Normocytic anemia Thrombocytopenia Chronic HFpEF Hypokalemia Asymptomatic Coronary/aortic atherosclerosis Splenomegaly Dilated ascending aorta   For questions or updates, please contact [facility] Please consult [email or web address] for contact info under        Signed, Maude Emmer, MD  06/21/2024, 8:41 AM

## 2024-06-21 NOTE — Evaluation (Signed)
 RT Evaluate and Treat Note  06/21/2024   Breathing is (select one): Same as normal    The following was found on auscultation (select multiple):  Bilateral Breath Sounds: Clear;Diminished (06/21/24 0809)             Cough Assessment: Cough: None (06/21/24 0809)    Most Recent Chest Xray:... (No results found.    The following medications and/or interventions were ordered/changed/discontinued as part of the Respiratory Treatment protocol:   Medication Changes: none   Airway Clearance Changes: none   Oxygen Therapy Changes: none

## 2024-06-22 DIAGNOSIS — J9601 Acute respiratory failure with hypoxia: Secondary | ICD-10-CM | POA: Diagnosis not present

## 2024-06-22 DIAGNOSIS — A419 Sepsis, unspecified organism: Secondary | ICD-10-CM | POA: Diagnosis not present

## 2024-06-22 DIAGNOSIS — I480 Paroxysmal atrial fibrillation: Secondary | ICD-10-CM | POA: Diagnosis not present

## 2024-06-22 DIAGNOSIS — J189 Pneumonia, unspecified organism: Secondary | ICD-10-CM | POA: Diagnosis not present

## 2024-06-22 DIAGNOSIS — R6521 Severe sepsis with septic shock: Secondary | ICD-10-CM | POA: Diagnosis not present

## 2024-06-22 MED ORDER — BETHANECHOL CHLORIDE 10 MG PO TABS
10.0000 mg | ORAL_TABLET | Freq: Three times a day (TID) | ORAL | Status: DC
Start: 2024-06-22 — End: 2024-06-22

## 2024-06-22 MED ORDER — BETHANECHOL CHLORIDE 10 MG PO TABS
10.0000 mg | ORAL_TABLET | Freq: Three times a day (TID) | ORAL | Status: AC
  Administered 2024-06-22 – 2024-06-24 (×9): 10 mg via ORAL
  Filled 2024-06-22 (×9): qty 1

## 2024-06-22 NOTE — IPAL (Signed)
  Interdisciplinary Goals of Care Family Meeting   Date carried out: 06/22/2024  Location of the meeting: Phone conference  Member's involved: Physician and Family Member or next of kin  Durable Power of Attorney or acting medical decision maker: Briana Fitzgerald, son who prefers to be primary point of contact.   Discussion: We discussed goals of care for Briana Fitzgerald .  She has had significant clinical decline over the last 1 year since a fall and fracture. Had been moved to assisted living 2 weeks ago. Now this hospitalization represents further clinical decline. Briana Fitzgerald says his mom wants to die. She is currently not able to feed herself and appears to be significantly uncomfortable, delirious with dementia.   Code status:   Code Status: Limited: Do not attempt resuscitation (DNR) -DNR-LIMITED -Do Not Intubate/DNI    Disposition: Continue current acute care with plans to consult palliative care for transition to hospice services.   Time spent for the meeting: 15 minutes.     Briana GORMAN Gore, MD  06/22/2024, 10:14 AM

## 2024-06-22 NOTE — Progress Notes (Signed)
 Pt has been retaining urine.  Bladder scan revealed .  CCM notified as she has had 2 prior in/out this admission.  Continue in/out protocol per CCM.  In/out 

## 2024-06-22 NOTE — Evaluation (Signed)
 Physical Therapy Evaluation Patient Details Name: Briana Fitzgerald MRN: [id number] DOB: [birth year] Today's Date: 06/22/2024  History of Present Illness  Patient is a 89 y/o female admitted 06/18/24 with AMS and rash on her hand.  Found to have Shingles on L hand and became hypotensive and noted A-fib.  Also found to have L sided pneumonia. PMH positive for TIA, CHF, RLS, back pain, OA, sleep apnea, bowel obstruction and mild dementia.  Clinical Impression  Patient presents with decreased mobility due to generalized weakness, decreased activity tolerance, decreased balance and decreased cognition.  She was living at ALF previously though unknown baseline.  Today needing +2 total A to EOB and tolerating sitting about 5 minutes for drinking orange juice with A to place cup.  She may benefit from continued skilled PT in the acute setting depending on palliative conversations.  May need inpatient rehab (<3 hours/day) at d/c.  SpO2 on 10L O2 94%, HR 68, BP supine 106/62, sitting 111/50, supine after sitting 104/58.        If plan is discharge home, recommend the following: Two people to help with bathing/dressing/bathroom;Two people to help with walking and/or transfers   Can travel by private vehicle        Equipment Recommendations None recommended by PT  Recommendations for Other Services       Functional Status Assessment Patient has had a recent decline in their functional status and/or demonstrates limited ability to make significant improvements in function in a reasonable and predictable amount of time     Precautions / Restrictions Precautions Precautions: Fall Precaution/Restrictions Comments: watch BP      Mobility  Bed Mobility Overal bed mobility: Needs Assistance Bed Mobility: Supine to Sit, Sit to Supine     Supine to sit: Total assist, +2 for physical assistance, HOB elevated Sit to supine: +2 for physical assistance, Total assist   General bed mobility comments:  used bed pads to scoot hips and lift trunk with total A to EOB; to supine with A for legs and trunk and to scoot to [facility]; repositioning for pillows under arms and legs    Transfers                   General transfer comment: declined to attempt    Ambulation/Gait                  Stairs            Wheelchair Mobility     Tilt Bed    Modified Rankin (Stroke Patients Only)       Balance Overall balance assessment: Needs assistance Sitting-balance support: Feet unsupported, Feet supported Sitting balance-Leahy Scale: Poor Sitting balance - Comments: min to mod A initially for sitting balance with posterior bias, once out closer to EOB and feet almost on floor able to sit with S. Held cup of orange juice in hands and brought mouth to the straw with CGA in sitting                                     Pertinent Vitals/Pain Pain Assessment Pain Assessment: Faces Faces Pain Scale: Hurts even more Pain Location: back sitting EOB, legs when moving in supine Pain Descriptors / Indicators: Aching, Discomfort, Grimacing Pain Intervention(s): Monitored during session, Limited activity within patient's tolerance, Repositioned    Home Living Family/patient expects to be discharged to:: Assisted living  Additional Comments: from ALF per chart, pt unable to give history and no family available.    Prior Function                       Extremity/Trunk Assessment   Upper Extremity Assessment Upper Extremity Assessment: RUE deficits/detail;LUE deficits/detail;Right hand dominant RUE Deficits / Details: edema and noted weeping on elbow with kerlix wrap, AAROM to about 90 degrees shoulder flexion, can bring her hand to mouth with elbow supported and used cup with both hands bringing mouth to straw to drink in sitting LUE Deficits / Details: edema and noted crusty rash on palmar surface of her hand with limited grip, AAROM  to about 90 degrees shoulder flexion, difficulty bringing her hand to mouth with elbow supported and used cup with both hands bringing mouth to straw to drink in sitting    Lower Extremity Assessment Lower Extremity Assessment: RLE deficits/detail;LLE deficits/detail RLE Deficits / Details: edema throughout with limited knee flexion about 40 degrees seated EOB, knee extension strength 3-/5 LLE Deficits / Details: edema throughout with limited knee flexion about 40 degrees seated EOB, knee extension strength 3-/5    Cervical / Trunk Assessment Cervical / Trunk Assessment: Kyphotic  Communication   Communication Communication: Impaired Factors Affecting Communication: Hearing impaired    Cognition Arousal: Alert Behavior During Therapy: Anxious   PT - Cognitive impairments: History of cognitive impairments, No family/caregiver present to determine baseline                       PT - Cognition Comments: mild dementia per chart, pt very HOH and repeats needing to see her son and spouse         Cueing Cueing Techniques: Verbal cues, Tactile cues, Gestural cues     General Comments      Exercises     Assessment/Plan    PT Assessment Patient needs continued PT services  PT Problem List Decreased strength;Decreased range of motion;Decreased activity tolerance;Decreased balance;Decreased mobility;Cardiopulmonary status limiting activity;Pain       PT Treatment Interventions DME instruction;Patient/family education;Functional mobility training;Therapeutic activities;Therapeutic exercise;Balance training    PT Goals (Current goals can be found in the Care Plan section)  Acute Rehab PT Goals Patient Stated Goal: none stated, family in palliative conversations PT Goal Formulation: Patient unable to participate in goal setting Time For Goal Achievement: 07/06/24 Potential to Achieve Goals: Fair    Frequency Min 1X/week     Co-evaluation               AM-PAC  PT 6 Clicks Mobility  Outcome Measure Help needed turning from your back to your side while in a flat bed without using bedrails?: Total Help needed moving from lying on your back to sitting on the side of a flat bed without using bedrails?: Total Help needed moving to and from a bed to a chair (including a wheelchair)?: Total Help needed standing up from a chair using your arms (e.g., wheelchair or bedside chair)?: Total Help needed to walk in hospital room?: Total Help needed climbing 3-5 steps with a railing? : Total 6 Click Score: 6    End of Session Equipment Utilized During Treatment: Gait belt;Oxygen Activity Tolerance: Patient limited by fatigue Patient left: in bed;with call bell/phone within reach   PT Visit Diagnosis: Muscle weakness (generalized) (M62.81);Other abnormalities of gait and mobility (R26.89)    Time: 1110-1135 PT Time Calculation (min) (ACUTE ONLY): 25 min   Charges:  PT Evaluation $PT Eval High Complexity: 1 High PT Treatments $Therapeutic Activity: 8-22 mins PT General Charges $$ ACUTE PT VISIT: 1 Visit         Micheline Portal, PT Acute Rehabilitation Services Office:[phone] 06/22/2024   Montie Portal 06/22/2024, 1:25 PM

## 2024-06-22 NOTE — NC FL2 (Signed)
 [place]  MEDICAID FL2 LEVEL OF CARE FORM     IDENTIFICATION  Patient Name: Briana Fitzgerald Birthdate: July 09, 1932 Sex: female Admission Date (Current Location): 06/18/2024  [facility] and [place] Number:  Producer, television/film/video and Address:  [facility] [place], [address] [place], [place] [address]      Provider Number: [id number]  Attending Physician Name and Address:  Meade Verdon RAMAN, MD  Relative Name and Phone Number:  Cabella, Kimm)  [phone]    Current Level of Care: Hospital Recommended Level of Care: Skilled Nursing Facility Prior Approval Number:    Date Approved/Denied:   PASRR Number: [id number]  Discharge Plan: SNF    Current Diagnoses: Patient Active Problem List   Diagnosis Date Noted   Cardiac arrest (HCC) 06/18/2024   Sepsis (HCC) 06/18/2024   Sacral fracture (HCC) 08/23/2022   Lumbar trigger point syndrome 09/23/2019   Coronary artery calcification seen on CT scan 09/23/2019   Aortic root dilatation 09/23/2019   Degenerative disc disease, cervical 08/20/2019   Lymphocytosis 05/11/2018   Diastolic dysfunction with heart failure (HCC) 05/06/2018   Obstructive sleep apnea 04/13/2014   History of migraine headaches 01/12/2013   Hyperlipidemia 09/02/2011   Osteoporosis 05/01/2011   Nonallopathic lesion of sacral region 04/17/2011   RESTLESS LEG SYNDROME 10/02/2010   STYE 10/02/2010   GERD 10/02/2010   Constipation 10/02/2010   Osteoarthritis 10/02/2010   History of cardiovascular disorder 10/02/2010   Backache 07/18/2008   Disorder of sacrum 06/16/2008   UNEQUAL LEG LENGTH 06/16/2008   Idiopathic scoliosis and kyphoscoliosis 06/16/2008   LAMINECTOMY, LUMBAR, HX OF 06/16/2008    Orientation RESPIRATION BLADDER Height & Weight     Self, Situation, Place  O2 Incontinent Weight: 108 lb 0.4 oz (49 kg) Height:  5' 3.5 (161.3 cm)  BEHAVIORAL SYMPTOMS/MOOD NEUROLOGICAL BOWEL NUTRITION STATUS        Diet (See dc  summary)  AMBULATORY STATUS COMMUNICATION OF NEEDS Skin   Total Care Verbally Other (Comment) (redness/rash/blister)                       Personal Care Assistance Level of Assistance  Bathing, Dressing, Feeding Bathing Assistance: Maximum assistance Feeding assistance: Independent Dressing Assistance: Maximum assistance     Functional Limitations Info  Sight, Hearing, Speech Sight Info: Adequate Hearing Info: Impaired Speech Info: Adequate    SPECIAL CARE FACTORS FREQUENCY  PT (By licensed PT), OT (By licensed OT)     PT Frequency: 5x/wk OT Frequency: 5x/wk            Contractures Contractures Info: Not present    Additional Factors Info  Allergies, Code Status Code Status Info: DNR Allergies Info: Penicillins           Current Medications (06/22/2024):  This is the current hospital active medication list Current Facility-Administered Medications  Medication Dose Route Frequency Provider Last Rate Last Admin   acetaminophen  (TYLENOL ) tablet 650 mg  650 mg Oral Q6H PRN Paliwal, Aditya, MD       amiodarone (PACERONE) tablet 100 mg  100 mg Oral Daily Nishan, Peter C, MD   100 mg at 06/22/24 0945   apixaban (ELIQUIS) tablet 2.5 mg  2.5 mg Oral BID Segal, Jared E, MD   2.5 mg at 06/22/24 0945   bethanechol (URECHOLINE) tablet 10 mg  10 mg Oral TID Desai, Nikita S, MD   10 mg at 06/22/24 0945   cefTRIAXone (ROCEPHIN) 2 g in sodium chloride  0.9 %  100 mL IVPB  2 g Intravenous Q24H Desai, Nikita S, MD 200 mL/hr at 06/22/24 1500 Infusion Verify at 06/22/24 1500   Chlorhexidine Gluconate Cloth 2 % PADS 6 each  6 each Topical Daily Gleason, Laura R, PA-C   6 each at 06/21/24 2135   docusate sodium  (COLACE) capsule 100 mg  100 mg Oral BID PRN Gleason, Laura R, PA-C       feeding supplement (ENSURE PLUS HIGH PROTEIN) liquid 237 mL  237 mL Oral BID BM Desai, Nikita S, MD   237 mL at 06/22/24 0946   levalbuterol (XOPENEX) nebulizer solution 0.63 mg  0.63 mg Nebulization Q6H  PRN Paliwal, Aditya, MD   0.63 mg at 06/19/24 0925   melatonin tablet 3 mg  3 mg Oral QHS Olalere, Adewale A, MD   3 mg at 06/21/24 2125   midodrine (PROAMATINE) tablet 10 mg  10 mg Oral Q8H Desai, Nikita S, MD   10 mg at 06/22/24 0945   Oral care mouth rinse  15 mL Mouth Rinse PRN Desai, Nikita S, MD       polyethylene glycol (MIRALAX / GLYCOLAX) packet 17 g  17 g Oral Daily PRN Gleason, Leita SAUNDERS, PA-C       QUEtiapine (SEROQUEL) tablet 25 mg  25 mg Oral QHS Desai, Nikita S, MD   25 mg at 06/21/24 2125   valACYclovir (VALTREX) tablet 1,000 mg  1,000 mg Oral QHS Desai, Nikita S, MD   1,000 mg at 06/21/24 2125     Discharge Medications: Please see discharge summary for a list of discharge medications.  Relevant Imaging Results:  Relevant Lab Results:   Additional Information SSN - [id number]  Lendia Dais, LCSWA

## 2024-06-22 NOTE — TOC Initial Note (Signed)
 Transition of Care [facility]) - Initial/Assessment Note    Patient Details  Name: Briana Fitzgerald MRN: [id number] Date of Birth: [birth year]  [facility]) CM/SW Contact:    Lendia Dais, LCSWA Phone Number: 06/22/2024, 3:42 PM  Clinical Narrative:  Pt is from [facility] and is disoriented x2.  CSW spoke to pt's son Zachary via phone. Zachary mentioned that they are supposed to have a palliative consult and may require hospice or SNF. CSW stated the will continue to monitor for the discharge plan. Zachary also mentioned that the patient will be downgraded in the evening, CSW mentioned there will be another CSW to help on another unit.   Pt had the DME of a rollator and wc. Zachary is the HCPOA and the pt does not drive outside the hospital. Pt supports inculde the son, daughter in law, and husband.   Pt has no SDOH concerns and no hx of MH or SUD.  TOC will continue to follow.          Expected Discharge Plan: Skilled Nursing Facility Barriers to Discharge: Continued Medical Work up   Patient Goals and CMS Choice            Expected Discharge Plan and Services In-house Referral: Clinical Social Work     Living arrangements for the past 2 months: Assisted Living Facility                                      Prior Living Arrangements/Services Living arrangements for the past 2 months: Assisted Living Facility Lives with:: Facility Resident Patient language and need for interpreter reviewed:: Yes Do you feel safe going back to the place where you live?: Yes      Need for Family Participation in Patient Care: Yes (Comment) Care giver support system in place?: Yes (comment) Current home services: DME Criminal Activity/Legal Involvement Pertinent to Current Situation/Hospitalization: No - Comment as needed  Activities of Daily Living      Permission Sought/Granted Permission sought to share information with : Other (comment) (Disoriented x2)     Share Information with NAME: Brandin Dilday  Permission granted to share info w AGENCY: [facility]  Permission granted to share info w Relationship: Son  Permission granted to share info w Contact Information: [phone]  Emotional Assessment Appearance:: Appears stated age Attitude/Demeanor/Rapport: Unable to Assess Affect (typically observed): Unable to Assess Orientation: : Oriented to Self, Oriented to Place Alcohol / Substance Use: Never Used Psych Involvement: No (comment)  Admission diagnosis:  Cardiac arrest (HCC) [I46.9] Atrial fibrillation with rapid ventricular response (HCC) [I48.91] Septic shock (HCC) [A41.9, R65.21] Sepsis (HCC) [A41.9] Community acquired pneumonia of left upper lobe of lung [J18.9] Patient Active Problem List   Diagnosis Date Noted   Cardiac arrest (HCC) 06/18/2024   Sepsis (HCC) 06/18/2024   Sacral fracture (HCC) 08/23/2022   Lumbar trigger point syndrome 09/23/2019   Coronary artery calcification seen on CT scan 09/23/2019   Aortic root dilatation 09/23/2019   Degenerative disc disease, cervical 08/20/2019   Lymphocytosis 05/11/2018   Diastolic dysfunction with heart failure (HCC) 05/06/2018   Obstructive sleep apnea 04/13/2014   History of migraine headaches 01/12/2013   Hyperlipidemia 09/02/2011   Osteoporosis 05/01/2011   Nonallopathic lesion of sacral region 04/17/2011   RESTLESS LEG SYNDROME 10/02/2010   STYE 10/02/2010   GERD 10/02/2010   Constipation 10/02/2010   Osteoarthritis 10/02/2010   History  of cardiovascular disorder 10/02/2010   Backache 07/18/2008   Disorder of sacrum 06/16/2008   UNEQUAL LEG LENGTH 06/16/2008   Idiopathic scoliosis and kyphoscoliosis 06/16/2008   LAMINECTOMY, LUMBAR, HX OF 06/16/2008   PCP:  Micheal Wolm ORN, MD Pharmacy:   [phone] - [place], [place] - [address] [employer] [place] [address] Phone: [phone] Fax:  [phone]     Social Drivers of Health (SDOH) Social History: SDOH Screenings   Food Insecurity: No Food Insecurity (05/27/2024)  Housing: Low Risk  (05/27/2024)  Transportation Needs: No Transportation Needs (05/27/2024)  Utilities: Not At Risk (09/25/2022)  Alcohol Screen: Low Risk  (05/27/2024)  Depression (PHQ2-9): Low Risk  (10/01/2023)  Financial Resource Strain: Low Risk  (05/27/2024)  Physical Activity: Inactive (05/27/2024)  Social Connections: Socially Isolated (05/27/2024)  Stress: No Stress Concern Present (05/27/2024)  Tobacco Use: Low Risk  (06/18/2024)   SDOH Interventions:     Readmission Risk Interventions     No data to display

## 2024-06-22 NOTE — Progress Notes (Signed)
 Progress Note  Patient Name: Briana Fitzgerald Date of Encounter: 06/22/2024  Primary Cardiologist: Oneil Parchment, MD   Subjective   No very alert   Inpatient Medications    Scheduled Meds:  amiodarone  100 mg Oral Daily   apixaban  2.5 mg Oral BID   Chlorhexidine Gluconate Cloth  6 each Topical Daily   feeding supplement  237 mL Oral BID BM   melatonin  3 mg Oral QHS   midodrine  10 mg Oral Q8H   QUEtiapine  25 mg Oral QHS   valACYclovir  1,000 mg Oral QHS   Continuous Infusions:  cefTRIAXone (ROCEPHIN)  IV Stopped (06/21/24 1721)   dexmedetomidine (PRECEDEX) IV infusion Stopped (06/21/24 1044)   norepinephrine (LEVOPHED) Adult infusion 4 mcg/min (06/22/24 0700)   PRN Meds: acetaminophen , docusate sodium , levalbuterol **AND** [DISCONTINUED] ipratropium, mouth rinse, polyethylene glycol   Vital Signs    Vitals:   06/22/24 0630 06/22/24 0700 06/22/24 0721 06/22/24 0730  BP: (!) 104/38 (!) 103/38  (!) 106/38  Pulse: 60 (!) 59  62  Resp: 19 17  17   Temp:   98.2 F (36.8 C)   TempSrc:      SpO2: 100% 99%  99%  Weight:      Height:        Intake/Output Summary (Last 24 hours) at 06/22/2024 0755 Last data filed at 06/22/2024 0700 Gross per 24 hour  Intake 1506.54 ml  Output 850 ml  Net 656.54 ml   Filed Weights   06/20/24 0500 06/21/24 0500 06/22/24 0500  Weight: 48 kg 49 kg 49 kg    Telemetry    Afib rates ok   ECG    Afib with RVR with ST depressions anterolaterally - Personally Reviewed  Physical Exam   Elderly female Bruising on arms Distant Heart Sounds Abdomen benign No edema Lungs anterior crackles    Labs    Chemistry Recent Labs  Lab 06/18/24 1643 06/18/24 1955 06/19/24 1705 06/20/24 0321 06/21/24 0337  NA 139   < > 140 134* 133*  K 3.6   < > 3.2* 3.8 3.6  CL 102   < > 102 98 99  CO2 26   < > 25 22 23   GLUCOSE 241*   < > 110* 127* 123*  BUN 33*   < > 32* 31* 29*  CREATININE 1.43*   < > 0.44 1.20* 0.99  CALCIUM  9.8   < >  8.8* 9.2 9.3  PROT 5.7*  --   --   --   --   ALBUMIN 2.8*  --   --   --  1.7*  AST 62*  --   --   --   --   ALT 30  --   --   --   --   ALKPHOS 79  --   --   --   --   BILITOT 2.0*  --   --   --   --   GFRNONAA 35*   < > >60 43* 54*  ANIONGAP 11   < > 13 14 11    < > = values in this interval not displayed.     Hematology Recent Labs  Lab 06/19/24 0328 06/20/24 0321 06/21/24 0337  WBC 18.6* 13.3* 11.2*  RBC 5.38* 4.91 4.62  HGB 14.9 13.5 12.5  HCT 46.5* 41.9 38.0  MCV 86.4 85.3 82.3  MCH 27.7 27.5 27.1  MCHC 32.0 32.2 32.9  RDW 15.5 15.2 15.3  PLT  155 158 178      Radiology    No results found.   Cardiac Studies   Echo 06/19/24:   1. Poor Echo images.   2. Challenging study in the setting of tachycardia, HR 135 bpm.   3. Left ventricular ejection fraction, by estimation, is 50 to 55%. The  left ventricle has low normal function. The left ventricle has no regional  wall motion abnormalities. There is mild left ventricular hypertrophy.  Left ventricular diastolic function   could not be evaluated.   4. Right ventricular systolic function is normal. The right ventricular  size is mildly enlarged. There is normal pulmonary artery systolic  pressure.   5. The mitral valve is abnormal. Mild mitral valve regurgitation. No  evidence of mitral stenosis. Severe mitral annular calcification.   6. The aortic valve was not well visualized. There is moderate  calcification of the aortic valve. Aortic valve regurgitation is not  visualized. No aortic stenosis is present. Aortic valve mean gradient  measures 3.0 mmHg.   7. The inferior vena cava is dilated in size with >50% respiratory  variability, suggesting right atrial pressure of 8 mmHg.   Comparison(s): No significant change from prior study.     Patient Profile     89 y.o. female who is nonambulatory and living in a nursing facility with a history of HFpEF managed with diuretics, TIA, aortic root dilation, mild  coronary calcifications on CT, aortoiliac atherosclerotic calcifications, splenomegaly who presented on 10/3 with fever, hypotension, AMS, rash on left hand and septic shock and atrial fibrillation with RVR which developed in the emergency room. Initially treated with IV diltiazem but then developed hypotension. She was then treated with norepinephrine and IV amiodarone with IV fluids. Cardiology consulted for new atrial fibrillation management.   Assessment & Plan   Principal Problem:   Cardiac arrest [facility]) Active Problems:   Sepsis (HCC)   Septic shock Continues on levophed and received antibiotics New atrial fibrillation/flutter with RVR CHA2DS2-VASc: 7 (CHF, stroke, vascular disease, age, gender) Changed to oral amiodarone 100 mg daily after iv load  Careful hydration advised Started on renally dosed Eliquis 2.5 mg BID (age, weight). Patient is at risk of complications (fall risk, frail)  she is non ambulatory and living in nursing home so may be able to use on d/c  Shingles of left hand UTI AKI Lactic acidosis, resolved Normocytic anemia Thrombocytopenia Chronic HFpEF Hypokalemia Asymptomatic Coronary/aortic atherosclerosis Splenomegaly Dilated ascending aorta   For questions or updates, please contact [facility] Please consult [email or web address] for contact info under        Signed, Maude Emmer, MD  06/22/2024, 7:55 AM

## 2024-06-22 NOTE — Progress Notes (Signed)
 NAME:  Briana Fitzgerald, MRN:  [id number], DOB:  [birth year], LOS: 4 ADMISSION DATE:  06/18/2024, CONSULTATION DATE:  06/18/24 REFERRING MD: Dr. Alm Cave, CHIEF COMPLAINT: Sepsis  History of Present Illness:  89 y/o female with PMH with h/o HFpEF, TIA, mild dementia who presented from assisted living with AMS and rash on her hand.  Rash x 3-4 days, AMS since today.  She is not able to give any history, history from chart and husband at bedside.  Patient noted to have Shingles on left hand.  She became hypotensive and went into A fib.  He was started on Levophed and Amiodarone drip.  Patient is otherwise DNR/DNI and this is confirmed. ED labs include: Cr 1.38, HgB 10.5, Plt 148, LA 2.6->1.6.  Cxr showing left sided pneumonia.  Pertinent  Medical History   Past Medical History:  Diagnosis Date   Arthritis    BACK PAIN 07/18/2008   Bowel obstruction (HCC)    CONSTIPATION 10/02/2010   GERD 10/02/2010   Headache    IBS (irritable bowel syndrome)    LAMINECTOMY, LUMBAR, HX OF 06/16/2008   OSTEOARTHRITIS 10/02/2010   Pneumonia    RESTLESS LEG SYNDROME 10/02/2010   SACROILIAC JOINT DYSFUNCTION 06/16/2008   Sleep apnea    STYE 10/02/2010   TRANSIENT ISCHEMIC ATTACKS, HX OF 10/02/2010  Mild dementia TIA Heart failure with preserved ejection fraction  Significant Hospital Events: Including procedures, antibiotic start and stop dates in addition to other pertinent events   10/3-admitted with sepsis, requiring pressors 10/5-delirious  Interim History / Subjective:  On low dose norepi. HR improved with amio off and precedex off. Some dementia and hypoactive delirum but pleasant. Asking for her family to come.   Objective    Blood pressure (!) 110/51, pulse 67, temperature 98.2 F (36.8 C), resp. rate (!) 24, height 5' 3.5 (1.613 m), weight 49 kg, SpO2 100%.    FiO2 (%):  [40 %-60 %] 40 %   Intake/Output Summary (Last 24 hours) at 06/22/2024 0836 Last data filed at 06/22/2024 0815 Gross per  24 hour  Intake 1449.7 ml  Output 850 ml  Net 599.7 ml   Filed Weights   06/20/24 0500 06/21/24 0500 06/22/24 0500  Weight: 48 kg 49 kg 49 kg   Examination: Elderly, frail, on HHFNC Tachypnic, no wheeze Very weak, unable to feed self Thin fragile skin Normal speech. Oriented to self.     I reviewed last 24 h vitals and pain scores, last 48 h intake and output, last 24 h labs and trends, and last 24 h imaging results.  Na 133 K 3.6 Mg 1.7 Cr 0.99 WBC 11.2  Resolved problem list   AKI  Assessment and Plan   Acute hypoxemic respiratory failure Septic shock secondary to  M. Catarrhalis bacteremia CAP secondary to M. Catarrhalis - continue ceftriaxone total 7 day course - wean vasopressors as tolerated. Currently on 3 mcg norepi - currently on HHFNC. Will continue weaning to salter  Atrial fibrillation with RVR Now with bradycardia which is likely mediating her hypotension - continue amiodarone, eliquis - cardiology following, appreciate recommendations  Delirium - continue seroquel.  - continue melatonin at bedtime  Shingles on the left hand - Continue acyclovir - Continue contact and airborne isolation   Thrombocytopenia, sepsis mediated - improving  Goals of care: She is DNR/DNI. She recently transitioned to assisted living a couple weeks ago. Prior to this was living at home with husband.  I think this hospitalization represents a significant  clinical decline. She is not currently able to feed herself. I think hospice given her dementia would be appropriate. I have called her son Zachary and left message for call back. Consult to palliative care place.d    The patient is critically ill due to shock. .  Critical care was necessary to treat or prevent imminent or life-threatening deterioration.  Critical care was time spent personally by me on the following activities: development of treatment plan with patient and/or surrogate as well as nursing, discussions  with consultants, evaluation of patient's response to treatment, examination of patient, obtaining history from patient or surrogate, ordering and performing treatments and interventions, ordering and review of laboratory studies, ordering and review of radiographic studies, pulse oximetry, re-evaluation of patient's condition and participation in multidisciplinary rounds.   Critical Care Time devoted to patient care services described in this note is 40 minutes. This time reflects time of care of this signee Delise Simenson S Dameion Briles . This critical care time does not reflect separately billable procedures or procedure time, teaching time or supervisory time of PA/NP/Med student/Med Resident etc but could involve care discussion time.       Verdon GORMAN [facility] 06/22/2024 8:41 AM  Pager: see AMION  If no response to pager , please call critical care on call (see AMION) until 7pm After 7:00 pm call Elink     Labs   CBC: Recent Labs  Lab 06/18/24 1643 06/18/24 1955 06/18/24 2003 06/19/24 0328 06/20/24 0321 06/21/24 0337  WBC 10.2 10.4  --  18.6* 13.3* 11.2*  NEUTROABS 8.5*  --   --   --   --   --   HGB 12.1 11.4* 10.5* 14.9 13.5 12.5  HCT 38.1 36.5 31.0* 46.5* 41.9 38.0  MCV 85.4 88.2  --  86.4 85.3 82.3  PLT 144* 148*  --  155 158 178    Basic Metabolic Panel: Recent Labs  Lab 06/18/24 1643 06/18/24 1955 06/18/24 2003 06/19/24 0945 06/19/24 1705 06/20/24 0321 06/21/24 0337  NA 139  --  140 137 140 134* 133*  K 3.6  --  3.2* 3.4* 3.2* 3.8 3.6  CL 102  --   --  103 102 98 99  CO2 26  --   --  25 25 22 23   GLUCOSE 241*  --   --  151* 110* 127* 123*  BUN 33*  --   --  31* 32* 31* 29*  CREATININE 1.43* 1.38*  --  1.40* 0.44 1.20* 0.99  CALCIUM  9.8  --   --  9.5 8.8* 9.2 9.3  MG  --   --   --  1.6*  --  1.9 1.7  PHOS  --   --   --   --   --  3.0 3.4   GFR: Estimated Creatinine Clearance: 29.2 mL/min (by C-G formula based on SCr of 0.99  mg/dL). Recent Labs  Lab 06/18/24 1711 06/18/24 1955 06/18/24 2006 06/19/24 0328 06/20/24 0321 06/21/24 0337  WBC  --  10.4  --  18.6* 13.3* 11.2*  LATICACIDVEN 2.6*  --  1.6  --   --   --    Liver Function Tests: Recent Labs  Lab 06/18/24 1643 06/21/24 0337  AST 62*  --   ALT 30  --   ALKPHOS 79  --   BILITOT 2.0*  --   PROT 5.7*  --   ALBUMIN 2.8* 1.7*   No results for input(s): LIPASE, AMYLASE  in the last 168 hours. No results for input(s): AMMONIA in the last 168 hours.  ABG    Component Value Date/Time   PHART 7.511 (H) 06/18/2024 2003   PCO2ART 33.8 06/18/2024 2003   PO2ART 75 (L) 06/18/2024 2003   HCO3 27.1 06/18/2024 2003   TCO2 28 06/18/2024 2003   O2SAT 96 06/18/2024 2003     Coagulation Profile: No results for input(s): INR, PROTIME in the last 168 hours.  Cardiac Enzymes: No results for input(s): CKTOTAL, CKMB, CKMBINDEX, TROPONINI in the last 168 hours.  HbA1C: Hgb A1c MFr Bld  Date/Time Value Ref Range Status  10/10/2023 12:52 PM 5.1 4.6 - 6.5 % Final    Comment:    Glycemic Control Guidelines for People with Diabetes:Non Diabetic:  <6%Goal of Therapy: <7%Additional Action Suggested:  >8%    CBG: Recent Labs  Lab 06/18/24 2038  GLUCAP 179*

## 2024-06-23 ENCOUNTER — Encounter (HOSPITAL_COMMUNITY): Payer: Self-pay

## 2024-06-23 DIAGNOSIS — R1319 Other dysphagia: Secondary | ICD-10-CM | POA: Diagnosis not present

## 2024-06-23 DIAGNOSIS — Z66 Do not resuscitate: Secondary | ICD-10-CM

## 2024-06-23 DIAGNOSIS — J1569 Pneumonia due to other gram-negative bacteria: Secondary | ICD-10-CM

## 2024-06-23 DIAGNOSIS — I4891 Unspecified atrial fibrillation: Secondary | ICD-10-CM | POA: Diagnosis not present

## 2024-06-23 DIAGNOSIS — Z7189 Other specified counseling: Secondary | ICD-10-CM | POA: Diagnosis not present

## 2024-06-23 DIAGNOSIS — Z515 Encounter for palliative care: Secondary | ICD-10-CM | POA: Diagnosis not present

## 2024-06-23 DIAGNOSIS — F05 Delirium due to known physiological condition: Secondary | ICD-10-CM

## 2024-06-23 DIAGNOSIS — N179 Acute kidney failure, unspecified: Secondary | ICD-10-CM

## 2024-06-23 DIAGNOSIS — A419 Sepsis, unspecified organism: Secondary | ICD-10-CM | POA: Diagnosis not present

## 2024-06-23 MED ORDER — AMIODARONE IV BOLUS ONLY 150 MG/100ML
150.0000 mg | Freq: Once | INTRAVENOUS | Status: AC
  Administered 2024-06-23: 150 mg via INTRAVENOUS
  Filled 2024-06-23: qty 100

## 2024-06-23 MED ORDER — AMIODARONE LOAD VIA INFUSION: 150.0000 mg | Freq: Once | INTRAVENOUS | Status: DC

## 2024-06-23 MED ORDER — AMIODARONE HCL IN DEXTROSE 360-4.14 MG/200ML-% IV SOLN
60.0000 mg/h | INTRAVENOUS | Status: AC
  Administered 2024-06-23: 60 mg/h via INTRAVENOUS
  Filled 2024-06-23 (×2): qty 200

## 2024-06-23 MED ORDER — AMIODARONE HCL IN DEXTROSE 360-4.14 MG/200ML-% IV SOLN
30.0000 mg/h | INTRAVENOUS | Status: DC
  Administered 2024-06-24: 30 mg/h via INTRAVENOUS

## 2024-06-23 NOTE — Assessment & Plan Note (Addendum)
 06/23/24 during her ICU stay from 06-18-2024 through 06-22-2024, she required IV precedex due to acute hyperactive delirium. Now off precedex since 06-22-2024. Resolved. Due to multiple etiologies including sepsis, pneumonia, AKI.

## 2024-06-23 NOTE — Subjective & Objective (Signed)
 Pt seen and examined. Pt is awake. Very frail appearing.

## 2024-06-23 NOTE — Assessment & Plan Note (Addendum)
 06/23/24 chronic.

## 2024-06-23 NOTE — Assessment & Plan Note (Addendum)
 06/23/24 due to her septic shock from pneumonia, AKI. Also developed hypotension from her septic shock and rapid afib.  Started on IV amiodarone on 06-18-2024. IV amio gtts stopped on 06-21-2024. Changed to po amio on 06-21-2024. Cardiology is following. On reduced dose eliquis 2.5 mg bid.

## 2024-06-23 NOTE — Assessment & Plan Note (Signed)
 06/23/24 seen by ST. On pureed diet. High risk for aspiration.

## 2024-06-23 NOTE — Plan of Care (Signed)

## 2024-06-23 NOTE — Progress Notes (Signed)
 Patient Name: ODDIE KUHLMANN Date of Encounter: 06/23/2024 [facility] Cardiologist: Oneil Parchment, MD   Interval Summary  .    No complaints.  Sitting in chair when suddenly went back into A-fib with RVR  Vital Signs .    Vitals:   06/23/24 1400 06/23/24 1500 06/23/24 1514 06/23/24 1600  BP: (!) 108/58 (!) 108/56  130/63  Pulse: 71 67  71  Resp: 17 14  17   Temp:   98.3 F (36.8 C)   TempSrc:   Axillary   SpO2: 93% 97%  95%  Weight:      Height:        Intake/Output Summary (Last 24 hours) at 06/23/2024 1657 Last data filed at 06/23/2024 1500 Gross per 24 hour  Intake 580 ml  Output 1250 ml  Net -670 ml      06/22/2024    5:00 AM 06/21/2024    5:00 AM 06/20/2024    5:00 AM  Last 3 Weights  Weight (lbs) 108 lb 0.4 oz 108 lb 0.4 oz 105 lb 13.1 oz  Weight (kg) 49 kg 49 kg 48 kg      Telemetry/ECG    06/23/2024 - Personally Reviewed Back in Afib around 4 PM on 06/23/2024  Echocardiogram 06/19/2024: 1. Poor Echo images.   2. Challenging study in the setting of tachycardia, HR 135 bpm.   3. Left ventricular ejection fraction, by estimation, is 50 to 55%. The  left ventricle has low normal function. The left ventricle has no regional  wall motion abnormalities. There is mild left ventricular hypertrophy.  Left ventricular diastolic function   could not be evaluated.   4. Right ventricular systolic function is normal. The right ventricular  size is mildly enlarged. There is normal pulmonary artery systolic  pressure.   5. The mitral valve is abnormal. Mild mitral valve regurgitation. No  evidence of mitral stenosis. Severe mitral annular calcification.   6. The aortic valve was not well visualized. There is moderate  calcification of the aortic valve. Aortic valve regurgitation is not  visualized. No aortic stenosis is present. Aortic valve mean gradient  measures 3.0 mmHg.   7. The inferior vena cava is dilated in size with >50% respiratory   variability, suggesting right atrial pressure of 8 mmHg.   Comparison(s): No significant change from prior study.   Physical Exam .   Physical Exam Vitals and nursing note reviewed.  Constitutional:      General: She is not in acute distress. Neck:     Vascular: No JVD.  Cardiovascular:     Rate and Rhythm: Tachycardia present. Rhythm irregular.     Heart sounds: Normal heart sounds. No murmur heard. Pulmonary:     Effort: Pulmonary effort is normal.     Breath sounds: Normal breath sounds. No wheezing or rales.  Musculoskeletal:     Right lower leg: No edema.     Left lower leg: No edema.      Assessment & Plan .     89 y/o female w/septic shock due to pneumonia, paroxysmal Afib w/RVR, shingles on left hand  PAF: Currently back in Afib w/RVR. Normotensive. On PO amiodarone 100 mg daily, has received 3 doses so far. She is getting midodrine 10 mg tid as well.  Recommend IV amiodarone for better rate control/conversion to sinus rhythm.  Discontinue p.o. amiodarone for now. Continue Eliquis 2.5 mg twice daily.  Septic shock: Resolved.  Management as per primary team.  Of note,  patient is DNR/DNI, with ongoing palliative care discussion.  Regardless, she would benefit from better rate control at this time.  Will follow-up tomorrow again to evaluate rate/rhythm control.  For questions or updates, please contact [facility] Please consult [email or web address] for contact info under        Signed, Newman JINNY Lawrence, MD

## 2024-06-23 NOTE — Assessment & Plan Note (Addendum)
 06/23/24 present on admission. Contributed to her rapid afib and septic shock.  Treated with IV rocephin. Will need to complete 10 days of IV/PO ABX. Today is #6 of 10

## 2024-06-23 NOTE — Hospital Course (Addendum)
 CC: rash left hand, AMS HPI: 89 y/o female with PMH with h/o HFpEF, TIA, mild dementia who presented from assisted living with AMS and rash on her hand.  Rash x 3-4 days, AMS since today.  She is not able to give any history, history from chart and husband at bedside.  Patient noted to have Shingles on left hand.  She became hypotensive and went into A fib.  He was started on Levophed and Amiodarone drip.  Patient is otherwise DNR/DNI and this is confirmed. ED labs include: Cr 1.38, HgB 10.5, Plt 148, LA 2.6->1.6.  Cxr showing left sided pneumonia.  Significant Events: Admitted 06/18/2024 to ICU by PCCM by due to rapid afib with hypotension requiring IV amio and IV levophed 06-20-2024 noted to be delirious/acute delirium. Started on IV Precedex. Septic shock. Blood cx positive for M. Catarrhalis. 06-22-2024 off precedex. Remains on IV levophed 06-23-2024 care transferred to [facility]  Admission Labs: WBC 10.2, HgB 12.1, plt 144 Na 139, K 3.6, CO2 of 26, BUN 33, Scr 1.43, glu 241 T. Prot 5.7, alb 2.8, AST 62, ALT 30, alk phos 79, t. Bili 2.0  Admission Imaging Studies: CXR Left upper lobe opacity is noted which may represent pneumonia, but underlying neoplasm or malignancy cannot be excluded. Left perihilar prominence is noted concerning for possible mass. CT scan of the chest is recommended for further evaluation. 2. Bibasilar opacities are noted concerning for pneumonia. CT head No acute intracranial abnormality. 2. Chronic ischemic microvascular changes with mild cerebral volume loss.  Significant Labs: 06-21-2024 admission blood cx growing Moraxella catarrhalis. Beta lactamase positive  Significant Imaging Studies:   Antibiotic Therapy: Anti-infectives (From admission, onward)    Start     Dose/Rate Route Frequency Ordered Stop   06/21/24 2200  valACYclovir (VALTREX) tablet 1,000 mg        1,000 mg Oral Daily at bedtime 06/21/24 0951 06/24/24 2359   06/21/24 1430  cefTRIAXone (ROCEPHIN) 2 g  in sodium chloride  0.9 % 100 mL IVPB        2 g 200 mL/hr over 30 Minutes Intravenous Every 24 hours 06/21/24 0945 06/24/24 2359   06/20/24 2200  acyclovir (ZOVIRAX) 480 mg in dextrose 5 % 100 mL IVPB  Status:  Discontinued        10 mg/kg  48 kg 109.6 mL/hr over 60 Minutes Intravenous Every 24 hours 06/20/24 0931 06/21/24 0951   06/19/24 1415  cefTRIAXone (ROCEPHIN) 2 g in sodium chloride  0.9 % 100 mL IVPB  Status:  Discontinued        2 g 200 mL/hr over 30 Minutes Intravenous Every 24 hours 06/19/24 1324 06/19/24 1334   06/19/24 1415  doxycycline  (VIBRAMYCIN ) 100 mg in sodium chloride  0.9 % 250 mL IVPB  Status:  Discontinued        100 mg 125 mL/hr over 120 Minutes Intravenous Every 12 hours 06/19/24 1324 06/19/24 1334   06/19/24 1415  cefTRIAXone (ROCEPHIN) 2 g in sodium chloride  0.9 % 100 mL IVPB  Status:  Discontinued        2 g 200 mL/hr over 30 Minutes Intravenous Every 24 hours 06/19/24 1334 06/21/24 0945   06/19/24 1415  doxycycline  (VIBRAMYCIN ) 100 mg in sodium chloride  0.9 % 250 mL IVPB  Status:  Discontinued        100 mg 125 mL/hr over 120 Minutes Intravenous Every 12 hours 06/19/24 1334 06/21/24 0945   06/18/24 2015  acyclovir (ZOVIRAX) 590 mg in dextrose 5 % 100 mL IVPB  Status:  Discontinued        10 mg/kg  59 kg 111.8 mL/hr over 60 Minutes Intravenous Every 24 hours 06/18/24 2013 06/20/24 0931   06/18/24 1830  doxycycline  (VIBRAMYCIN ) 100 mg in sodium chloride  0.9 % 250 mL IVPB        100 mg 125 mL/hr over 120 Minutes Intravenous  Once 06/18/24 1823 06/18/24 2158   06/18/24 1815  cefTRIAXone (ROCEPHIN) 1 g in sodium chloride  0.9 % 100 mL IVPB        1 g 200 mL/hr over 30 Minutes Intravenous  Once 06/18/24 1812 06/18/24 1928   06/18/24 1815  azithromycin  (ZITHROMAX ) 500 mg in sodium chloride  0.9 % 250 mL IVPB  Status:  Discontinued        500 mg 250 mL/hr over 60 Minutes Intravenous  Once 06/18/24 1812 06/18/24 1823        Procedures:   Consultants: Cardiology PCCM

## 2024-06-23 NOTE — Assessment & Plan Note (Signed)
 06/23/24 prior to admission. Required IV levophed for BP support. Off levophed on 06-22-2024. Care transferred to [facility] on 06-23-2024. Resolved.

## 2024-06-23 NOTE — Progress Notes (Signed)
 PROGRESS NOTE    Briana Fitzgerald  FMW:[id number] DOB: 08-05-1932 DOA: 06/18/2024 PCP: Micheal Wolm ORN, MD  Subjective: Pt seen and examined. Pt is awake. Very frail appearing.   Hospital Course: CC: rash left hand, AMS HPI: 89 y/o female with PMH with h/o HFpEF, TIA, mild dementia who presented from assisted living with AMS and rash on her hand.  Rash x 3-4 days, AMS since today.  She is not able to give any history, history from chart and husband at bedside.  Patient noted to have Shingles on left hand.  She became hypotensive and went into A fib.  He was started on Levophed and Amiodarone drip.  Patient is otherwise DNR/DNI and this is confirmed. ED labs include: Cr 1.38, HgB 10.5, Plt 148, LA 2.6->1.6.  Cxr showing left sided pneumonia.  Significant Events: Admitted 06/18/2024 to ICU by PCCM by due to rapid afib with hypotension requiring IV amio and IV levophed 06-20-2024 noted to be delirious/acute delirium. Started on IV Precedex. Septic shock. Blood cx positive for M. Catarrhalis. 06-22-2024 off precedex. Remains on IV levophed 06-23-2024 care transferred to [facility]  Admission Labs: WBC 10.2, HgB 12.1, plt 144 Na 139, K 3.6, CO2 of 26, BUN 33, Scr 1.43, glu 241 T. Prot 5.7, alb 2.8, AST 62, ALT 30, alk phos 79, t. Bili 2.0  Admission Imaging Studies: CXR Left upper lobe opacity is noted which may represent pneumonia, but underlying neoplasm or malignancy cannot be excluded. Left perihilar prominence is noted concerning for possible mass. CT scan of the chest is recommended for further evaluation. 2. Bibasilar opacities are noted concerning for pneumonia. CT head No acute intracranial abnormality. 2. Chronic ischemic microvascular changes with mild cerebral volume loss.  Significant Labs: 06-21-2024 admission blood cx growing Moraxella catarrhalis. Beta lactamase positive  Significant Imaging Studies:   Antibiotic Therapy: Anti-infectives (From admission, onward)    Start      Dose/Rate Route Frequency Ordered Stop   06/21/24 2200  valACYclovir (VALTREX) tablet 1,000 mg        1,000 mg Oral Daily at bedtime 06/21/24 0951 06/24/24 2359   06/21/24 1430  cefTRIAXone (ROCEPHIN) 2 g in sodium chloride  0.9 % 100 mL IVPB        2 g 200 mL/hr over 30 Minutes Intravenous Every 24 hours 06/21/24 0945 06/24/24 2359   06/20/24 2200  acyclovir (ZOVIRAX) 480 mg in dextrose 5 % 100 mL IVPB  Status:  Discontinued        10 mg/kg  48 kg 109.6 mL/hr over 60 Minutes Intravenous Every 24 hours 06/20/24 0931 06/21/24 0951   06/19/24 1415  cefTRIAXone (ROCEPHIN) 2 g in sodium chloride  0.9 % 100 mL IVPB  Status:  Discontinued        2 g 200 mL/hr over 30 Minutes Intravenous Every 24 hours 06/19/24 1324 06/19/24 1334   06/19/24 1415  doxycycline  (VIBRAMYCIN ) 100 mg in sodium chloride  0.9 % 250 mL IVPB  Status:  Discontinued        100 mg 125 mL/hr over 120 Minutes Intravenous Every 12 hours 06/19/24 1324 06/19/24 1334   06/19/24 1415  cefTRIAXone (ROCEPHIN) 2 g in sodium chloride  0.9 % 100 mL IVPB  Status:  Discontinued        2 g 200 mL/hr over 30 Minutes Intravenous Every 24 hours 06/19/24 1334 06/21/24 0945   06/19/24 1415  doxycycline  (VIBRAMYCIN ) 100 mg in sodium chloride  0.9 % 250 mL IVPB  Status:  Discontinued  100 mg 125 mL/hr over 120 Minutes Intravenous Every 12 hours 06/19/24 1334 06/21/24 0945   06/18/24 2015  acyclovir (ZOVIRAX) 590 mg in dextrose 5 % 100 mL IVPB  Status:  Discontinued        10 mg/kg  59 kg 111.8 mL/hr over 60 Minutes Intravenous Every 24 hours 06/18/24 2013 06/20/24 0931   06/18/24 1830  doxycycline  (VIBRAMYCIN ) 100 mg in sodium chloride  0.9 % 250 mL IVPB        100 mg 125 mL/hr over 120 Minutes Intravenous  Once 06/18/24 1823 06/18/24 2158   06/18/24 1815  cefTRIAXone (ROCEPHIN) 1 g in sodium chloride  0.9 % 100 mL IVPB        1 g 200 mL/hr over 30 Minutes Intravenous  Once 06/18/24 1812 06/18/24 1928   06/18/24 1815  azithromycin  (ZITHROMAX )  500 mg in sodium chloride  0.9 % 250 mL IVPB  Status:  Discontinued        500 mg 250 mL/hr over 60 Minutes Intravenous  Once 06/18/24 1812 06/18/24 1823       Procedures:   Consultants: Cardiology PCCM    Assessment and Plan: * Septic shock (HCC)-resolved as of 06/23/2024 06/23/24 prior to admission. Required IV levophed for BP support. Off levophed on 06-22-2024. Care transferred to [facility] on 06-23-2024. Resolved.   Moraxella catarrhalis pneumonia (HCC) 06/23/24 present on admission. Contributed to her rapid afib and septic shock.  Treated with IV rocephin. Will need to complete 10 days of IV/PO ABX. Today is #6 of 10   Esophageal dysphagia 06/23/24 seen by ST. On pureed diet. High risk for aspiration.   Rapid atrial fibrillation (HCC) 06/23/24 due to her septic shock from pneumonia, AKI. Also developed hypotension from her septic shock and rapid afib.  Started on IV amiodarone on 06-18-2024. IV amio gtts stopped on 06-21-2024. Changed to po amio on 06-21-2024. Cardiology is following. On reduced dose eliquis 2.5 mg bid.   AKI (acute kidney injury)-resolved as of 06/23/2024 06/23/24 admitted on 06-18-2024 with Scr of 1.43.  due to septic shock, hypotension, rapid afib. Scr now 0.99. resolved.   DNR (do not resuscitate)/DNI(Do Not Intubate) 06/23/24 PCCM had IPAL meeting with pt's son Zachary on 06-22-2024. pt made DNR/DNI on 06-22-2024.  Aortic root dilatation 06/23/24 chronic.   Chronic diastolic CHF (congestive heart failure) (HCC) 06/23/24 not exacerbated.   Delirium due to multiple etiologies, acute, hyperactive-resolved as of 06/23/2024 06/23/24 during her ICU stay from 06-18-2024 through 06-22-2024, she required IV precedex due to acute hyperactive delirium. Now off precedex since 06-22-2024. Resolved. Due to multiple etiologies including sepsis, pneumonia, AKI.   DVT prophylaxis: apixaban (ELIQUIS) tablet 2.5 mg Start: 06/19/24 1015 SCDs Start: 06/18/24 1918 SCDs Start:  06/18/24 1908 apixaban (ELIQUIS) tablet 2.5 mg     Code Status: Limited: Do not attempt resuscitation (DNR) -DNR-LIMITED -Do Not Intubate/DNI  Family Communication: no family at bedside Disposition Plan: SNF Reason for continuing need for hospitalization: remains on IV ABX, palliative care consulted for GOC, hospice.  Objective: Vitals:   06/23/24 1343 06/23/24 1400 06/23/24 1500 06/23/24 1514  BP:  (!) 108/58 (!) 108/56   Pulse: 70 71 67   Resp: 18 17 14    Temp:    98.3 F (36.8 C)  TempSrc:    Axillary  SpO2: 95% 93% 97%   Weight:      Height:        Intake/Output Summary (Last 24 hours) at 06/23/2024 1607 Last data filed at 06/23/2024 1500 Gross per 24  hour  Intake 580 ml  Output 1250 ml  Net -670 ml   Filed Weights   06/20/24 0500 06/21/24 0500 06/22/24 0500  Weight: 48 kg 49 kg 49 kg    Examination:  Physical Exam Vitals and nursing note reviewed.  Constitutional:      General: She is not in acute distress.    Appearance: She is not toxic-appearing.  HENT:     Head: Normocephalic and atraumatic.  Cardiovascular:     Rate and Rhythm: Normal rate. Rhythm irregular.     Pulses: Normal pulses.  Pulmonary:     Effort: Pulmonary effort is normal.     Breath sounds: Normal breath sounds.  Abdominal:     General: Abdomen is flat. Bowel sounds are normal.     Palpations: Abdomen is soft.  Musculoskeletal:     Right lower leg: Edema present.     Left lower leg: Edema present.  Skin:    General: Skin is warm and dry.     Capillary Refill: Capillary refill takes less than 2 seconds.     Comments: Skin tear right forearm  Neurological:     Mental Status: She is alert. She is disoriented.     Data Reviewed: I have personally reviewed following labs and imaging studies  CBC: Recent Labs  Lab 06/18/24 1643 06/18/24 1955 06/18/24 2003 06/19/24 0328 06/20/24 0321 06/21/24 0337  WBC 10.2 10.4  --  18.6* 13.3* 11.2*  NEUTROABS 8.5*  --   --   --   --   --    HGB 12.1 11.4* 10.5* 14.9 13.5 12.5  HCT 38.1 36.5 31.0* 46.5* 41.9 38.0  MCV 85.4 88.2  --  86.4 85.3 82.3  PLT 144* 148*  --  155 158 178   Basic Metabolic Panel: Recent Labs  Lab 06/18/24 1643 06/18/24 1955 06/18/24 2003 06/19/24 0945 06/19/24 1705 06/20/24 0321 06/21/24 0337  NA 139  --  140 137 140 134* 133*  K 3.6  --  3.2* 3.4* 3.2* 3.8 3.6  CL 102  --   --  103 102 98 99  CO2 26  --   --  25 25 22 23   GLUCOSE 241*  --   --  151* 110* 127* 123*  BUN 33*  --   --  31* 32* 31* 29*  CREATININE 1.43* 1.38*  --  1.40* 0.44 1.20* 0.99  CALCIUM  9.8  --   --  9.5 8.8* 9.2 9.3  MG  --   --   --  1.6*  --  1.9 1.7  PHOS  --   --   --   --   --  3.0 3.4   GFR: Estimated Creatinine Clearance: 29.2 mL/min (by C-G formula based on SCr of 0.99 mg/dL). Liver Function Tests: Recent Labs  Lab 06/18/24 1643 06/21/24 0337  AST 62*  --   ALT 30  --   ALKPHOS 79  --   BILITOT 2.0*  --   PROT 5.7*  --   ALBUMIN 2.8* 1.7*   CBG: Recent Labs  Lab 06/18/24 2038  GLUCAP 179*   Sepsis Labs: Recent Labs  Lab 06/18/24 1711 06/18/24 2006  LATICACIDVEN 2.6* 1.6    Recent Results (from the past 240 hours)  Blood culture (routine x 2)     Status: Abnormal   Collection Time: 06/18/24  5:00 PM   Specimen: BLOOD  Result Value Ref Range Status   Specimen Description BLOOD RIGHT ANTECUBITAL  Final  Special Requests   Final    BOTTLES DRAWN AEROBIC AND ANAEROBIC Blood Culture results may not be optimal due to an inadequate volume of blood received in culture bottles   Culture  Setup Time   Final    GRAM NEGATIVE COCCI IN BOTH AEROBIC AND ANAEROBIC BOTTLES CRITICAL VALUE NOTED.  VALUE IS CONSISTENT WITH PREVIOUSLY REPORTED AND CALLED VALUE. CORRECTED RESULTS PREVIOUSLY REPORTED AS: GRAM VARIABLE ROD CRITICAL RESULT CALLED TO, READ BACK BY AND VERIFIED WITH: PHARMD B.WANNARAT AT 9061 ON 06/21/2024 BY T.SAAD.    Culture (A)  Final    MORAXELLA CATARRHALIS(BRANHAMELLA) BETA  LACTAMASE POSITIVE    Report Status 06/21/2024 FINAL  Final  Resp panel by RT-PCR (RSV, Flu A&B, Covid) Urine, Clean Catch     Status: None   Collection Time: 06/18/24  5:02 PM   Specimen: Urine, Clean Catch; Nasal Swab  Result Value Ref Range Status   SARS Coronavirus 2 by RT PCR NEGATIVE NEGATIVE Final   Influenza A by PCR NEGATIVE NEGATIVE Final   Influenza B by PCR NEGATIVE NEGATIVE Final    Comment: (NOTE) The Xpert Xpress SARS-CoV-2/FLU/RSV plus assay is intended as an aid in the diagnosis of influenza from Nasopharyngeal swab specimens and should not be used as a sole basis for treatment. Nasal washings and aspirates are unacceptable for Xpert Xpress SARS-CoV-2/FLU/RSV testing.  Fact Sheet for Patients: BloggerCourse.com  Fact Sheet for Healthcare Providers: SeriousBroker.it  This test is not yet approved or cleared by the [place]  FDA and has been authorized for detection and/or diagnosis of SARS-CoV-2 by FDA under an Emergency Use Authorization (EUA). This EUA will remain in effect (meaning this test can be used) for the duration of the COVID-19 declaration under Section 564(b)(1) of the Act, 21 U.S.C. section 360bbb-3(b)(1), unless the authorization is terminated or revoked.     Resp Syncytial Virus by PCR NEGATIVE NEGATIVE Final    Comment: (NOTE) Fact Sheet for Patients: BloggerCourse.com  Fact Sheet for Healthcare Providers: SeriousBroker.it  This test is not yet approved or cleared by the [place]  FDA and has been authorized for detection and/or diagnosis of SARS-CoV-2 by FDA under an Emergency Use Authorization (EUA). This EUA will remain in effect (meaning this test can be used) for the duration of the COVID-19 declaration under Section 564(b)(1) of the Act, 21 U.S.C. section 360bbb-3(b)(1), unless the authorization is terminated  or revoked.  Performed at [facility], [address], [place], [place] [address]   Blood culture (routine x 2)     Status: Abnormal   Collection Time: 06/18/24  5:18 PM   Specimen: BLOOD RIGHT ARM  Result Value Ref Range Status   Specimen Description BLOOD RIGHT ARM  Final   Special Requests   Final    BOTTLES DRAWN AEROBIC AND ANAEROBIC Blood Culture results may not be optimal due to an inadequate volume of blood received in culture bottles   Culture  Setup Time   Final    GRAM NEGATIVE COCCI IN BOTH AEROBIC AND ANAEROBIC BOTTLES CRITICAL RESULT CALLED TO, READ BACK BY AND VERIFIED WITH: PHARMD J.MILLEN AT 1157 ON 06/19/2024 BY T.SAAD. PREVIOUSLY REPORTED AS: GRAM VARIABLE ROD CORRECTED RESULTS CALLED TO: PHARMD B.WANNARAT AT 9061 ON 06/21/2024 BY T.SAAD.    Culture (A)  Final    MORAXELLA CATARRHALIS(BRANHAMELLA) BETA LACTAMASE POSITIVE Performed at [facility], [address], [place], [place] [address]    Report Status 06/21/2024 FINAL  Final  Blood Culture ID Panel (Reflexed)  Status: None   Collection Time: 06/18/24  5:18 PM  Result Value Ref Range Status   Enterococcus faecalis NOT DETECTED NOT DETECTED Final   Enterococcus Faecium NOT DETECTED NOT DETECTED Final   Listeria monocytogenes NOT DETECTED NOT DETECTED Final   Staphylococcus species NOT DETECTED NOT DETECTED Final   Staphylococcus aureus (BCID) NOT DETECTED NOT DETECTED Final   Staphylococcus epidermidis NOT DETECTED NOT DETECTED Final   Staphylococcus lugdunensis NOT DETECTED NOT DETECTED Final   Streptococcus species NOT DETECTED NOT DETECTED Final   Streptococcus agalactiae NOT DETECTED NOT DETECTED Final   Streptococcus pneumoniae NOT DETECTED NOT DETECTED Final   Streptococcus pyogenes NOT DETECTED NOT DETECTED Final   A.calcoaceticus-baumannii NOT DETECTED NOT DETECTED Final   Bacteroides fragilis NOT DETECTED NOT DETECTED Final   Enterobacterales NOT DETECTED NOT DETECTED Final    Enterobacter cloacae complex NOT DETECTED NOT DETECTED Final   Escherichia coli NOT DETECTED NOT DETECTED Final   Klebsiella aerogenes NOT DETECTED NOT DETECTED Final   Klebsiella oxytoca NOT DETECTED NOT DETECTED Final   Klebsiella pneumoniae NOT DETECTED NOT DETECTED Final   Proteus species NOT DETECTED NOT DETECTED Final   Salmonella species NOT DETECTED NOT DETECTED Final   Serratia marcescens NOT DETECTED NOT DETECTED Final   Haemophilus influenzae NOT DETECTED NOT DETECTED Final   Neisseria meningitidis NOT DETECTED NOT DETECTED Final   Pseudomonas aeruginosa NOT DETECTED NOT DETECTED Final   Stenotrophomonas maltophilia NOT DETECTED NOT DETECTED Final   Candida albicans NOT DETECTED NOT DETECTED Final   Candida auris NOT DETECTED NOT DETECTED Final   Candida glabrata NOT DETECTED NOT DETECTED Final   Candida krusei NOT DETECTED NOT DETECTED Final   Candida parapsilosis NOT DETECTED NOT DETECTED Final   Candida tropicalis NOT DETECTED NOT DETECTED Final   Cryptococcus neoformans/gattii NOT DETECTED NOT DETECTED Final    Comment: Performed at [facility], [address] [place], [place] [address]  MRSA Next Gen by PCR, Nasal     Status: None   Collection Time: 06/18/24  7:07 PM   Specimen: Nasal Mucosa; Nasal Swab  Result Value Ref Range Status   MRSA by PCR Next Gen NOT DETECTED NOT DETECTED Final    Comment: (NOTE) The GeneXpert MRSA Assay (FDA approved for NASAL specimens only), is one component of a comprehensive MRSA colonization surveillance program. It is not intended to diagnose MRSA infection nor to guide or monitor treatment for MRSA infections. Test performance is not FDA approved in patients less than 59 years old. Performed at [facility], [address] [place], [place] [address]   Culture, blood (Routine X 2) w Reflex to ID Panel     Status: None (Preliminary result)   Collection Time: 06/20/24  3:42 PM   Specimen: BLOOD LEFT HAND  Result Value  Ref Range Status   Specimen Description BLOOD LEFT HAND  Final   Special Requests   Final    BOTTLES DRAWN AEROBIC AND ANAEROBIC Blood Culture adequate volume   Culture   Final    NO GROWTH 3 DAYS Performed at [facility], [address], [place], [place] [address]    Report Status PENDING  Incomplete  Culture, blood (Routine X 2) w Reflex to ID Panel     Status: None (Preliminary result)   Collection Time: 06/20/24  3:44 PM   Specimen: BLOOD RIGHT HAND  Result Value Ref Range Status   Specimen Description BLOOD RIGHT HAND  Final   Special Requests  Final    BOTTLES DRAWN AEROBIC ONLY Blood Culture results may not be optimal due to an inadequate volume of blood received in culture bottles   Culture   Final    NO GROWTH 3 DAYS Performed at [facility], [address] [place], [place] [address]    Report Status PENDING  Incomplete    Scheduled Meds:  amiodarone  100 mg Oral Daily   apixaban  2.5 mg Oral BID   bethanechol  10 mg Oral TID   Chlorhexidine Gluconate Cloth  6 each Topical Daily   feeding supplement  237 mL Oral BID BM   melatonin  3 mg Oral QHS   midodrine  10 mg Oral Q8H   QUEtiapine  25 mg Oral QHS   valACYclovir  1,000 mg Oral QHS   Continuous Infusions:  cefTRIAXone (ROCEPHIN)  IV Stopped (06/23/24 1412)     LOS: 5 days   Time spent: 60 minutes  Camellia Door, DO  [facility]  06/23/2024, 4:07 PM

## 2024-06-23 NOTE — Assessment & Plan Note (Addendum)
 06/23/24 not exacerbated.

## 2024-06-23 NOTE — Assessment & Plan Note (Addendum)
 06/23/24 PCCM had IPAL meeting with pt's son Zachary on 06-22-2024. pt made DNR/DNI on 06-22-2024.

## 2024-06-23 NOTE — Assessment & Plan Note (Addendum)
 06/23/24 admitted on 06-18-2024 with Scr of 1.43.  due to septic shock, hypotension, rapid afib. Scr now 0.99. resolved.

## 2024-06-23 NOTE — Consult Note (Signed)
 Palliative Care Consult Note                                  Date: 06/23/2024   Patient Name: Briana Fitzgerald  DOB: [birth year]  MRN: [id number]  Age / Sex: 89 y.o., female  PCP: Micheal Wolm ORN, MD Referring Physician: Laurence Locus, DO  Reason for Consultation: {Reason for Consult:23484}  Past Medical History:  Diagnosis Date   Arthritis    BACK PAIN 07/18/2008   Bowel obstruction (HCC)    CONSTIPATION 10/02/2010   GERD 10/02/2010   Headache    IBS (irritable bowel syndrome)    LAMINECTOMY, LUMBAR, HX OF 06/16/2008   OSTEOARTHRITIS 10/02/2010   Pneumonia    RESTLESS LEG SYNDROME 10/02/2010   SACROILIAC JOINT DYSFUNCTION 06/16/2008   Sleep apnea    STYE 10/02/2010   TRANSIENT ISCHEMIC ATTACKS, HX OF 10/02/2010    Subjective:   This NP Locus Kays reviewed medical records, received report from team, assessed the patient and then meet at the patient's bedside to discuss diagnosis, prognosis, GOC, EOL wishes disposition and options.  Before meeting with the patient/family, I spent time reviewing the chart notes including ***. I also reviewed vital signs, nursing flowsheets, medication administrations record, labs, and imaging. Labs reviewed include ***.  I met with ***.   We meet to discuss diagnosis prognosis, GOC, EOL wishes, disposition and options. Concept of Palliative Care was introduced as specialized medical care for people and their families living with serious illness.  If focuses on providing relief from the symptoms and stress of a serious illness.  The goal is to improve quality of life for both the patient and the family. Values and goals of care important to patient and family were attempted to be elicited.  Created space and opportunity for patient  and family to explore thoughts and feelings regarding current medical situation   Natural trajectory and current clinical status were discussed. Questions and concerns  addressed. Patient  encouraged to call with questions or concerns.    Patient/Family Understanding of Illness: ***  Life Review: ***  Patient Values: ***  Baseline Status: ***  Today's Discussion: ***  Goals: ***  Review of Systems  Objective:   Primary Diagnoses: Present on Admission:  Cardiac arrest (HCC)  Sepsis (HCC)   Vital Signs:  BP (!) 121/54   Pulse 70   Temp 97.9 F (36.6 C) (Oral)   Resp 18   Ht 5' 3.5 (1.613 m)   Wt 49 kg   SpO2 95%   BMI 18.84 kg/m   Physical Exam  Palliative Assessment/Data: ***   Advanced Care Planning:   Existing Vynca/ACP Documentation: ***  Primary Decision Maker: {Primary Decision Fjxzm:78612}  Pertinent diagnosis: ***  The patient and/or family consented to a voluntary Advance Care Planning Conversation in person/over the phone***. Individuals present for the conversation: ***  Summary of the conversation: ***  Outcome of the conversations and/or documents completed: ***  I spent *** minutes providing separately identifiable ACP services with the patient and/or surrogate decision maker in a voluntary, in-person conversation discussing the patient's wishes and goals as detailed in the above note.  Assessment & Plan:   HPI/Patient Profile: 89 y.o. female  with past medical history of *** admitted on 06/18/2024 with ***.   SUMMARY OF RECOMMENDATIONS   ***  Symptom Management:  ***  Code Status: {Updated Palliative Code Status:33307}  Prognosis:  {Palliative  Care Prognosis:23504}  Discharge Planning:  {Palliative dispostion:23505}   Discussed with: ***    Thank you for allowing us  to participate in the care of Briana Fitzgerald PMT will continue to support holistically.  Time Total: ***  Detailed review of medical records (labs, imaging, vital signs), medically appropriate exam, discussed with treatment team, counseling and education to patient, family, & staff, documenting clinical  information, medication management, coordination of care  Signed by: Camellia Kays, NP Palliative Medicine Team  Team Phone # [phone] (Nights/Weekends)  06/23/2024, 1:49 PM

## 2024-06-24 DIAGNOSIS — R6521 Severe sepsis with septic shock: Secondary | ICD-10-CM | POA: Diagnosis not present

## 2024-06-24 DIAGNOSIS — I480 Paroxysmal atrial fibrillation: Secondary | ICD-10-CM | POA: Diagnosis not present

## 2024-06-24 DIAGNOSIS — A419 Sepsis, unspecified organism: Secondary | ICD-10-CM | POA: Diagnosis not present

## 2024-06-24 LAB — CBC WITH DIFFERENTIAL/PLATELET
Abs Immature Granulocytes: 0.18 K/uL — ABNORMAL HIGH (ref 0.00–0.07)
Basophils Absolute: 0 K/uL (ref 0.0–0.1)
Basophils Relative: 0 %
Eosinophils Absolute: 0.1 K/uL (ref 0.0–0.5)
Eosinophils Relative: 1 %
HCT: 31.7 % — ABNORMAL LOW (ref 36.0–46.0)
Hemoglobin: 10.6 g/dL — ABNORMAL LOW (ref 12.0–15.0)
Immature Granulocytes: 2 %
Lymphocytes Relative: 16 %
Lymphs Abs: 1.4 K/uL (ref 0.7–4.0)
MCH: 27.2 pg (ref 26.0–34.0)
MCHC: 33.4 g/dL (ref 30.0–36.0)
MCV: 81.3 fL (ref 80.0–100.0)
Monocytes Absolute: 0.7 K/uL (ref 0.1–1.0)
Monocytes Relative: 8 %
Neutro Abs: 6.5 K/uL (ref 1.7–7.7)
Neutrophils Relative %: 73 %
Platelets: 193 K/uL (ref 150–400)
RBC: 3.9 MIL/uL (ref 3.87–5.11)
RDW: 15.3 % (ref 11.5–15.5)
WBC: 8.9 K/uL (ref 4.0–10.5)
nRBC: 0 % (ref 0.0–0.2)

## 2024-06-24 LAB — COMPREHENSIVE METABOLIC PANEL WITH GFR
ALT: 12 U/L (ref 0–44)
AST: 15 U/L (ref 15–41)
Albumin: 1.6 g/dL — ABNORMAL LOW (ref 3.5–5.0)
Alkaline Phosphatase: 60 U/L (ref 38–126)
Anion gap: 9 (ref 5–15)
BUN: 40 mg/dL — ABNORMAL HIGH (ref 8–23)
CO2: 25 mmol/L (ref 22–32)
Calcium: 9.5 mg/dL (ref 8.9–10.3)
Chloride: 100 mmol/L (ref 98–111)
Creatinine, Ser: 1.27 mg/dL — ABNORMAL HIGH (ref 0.44–1.00)
GFR, Estimated: 40 mL/min — ABNORMAL LOW (ref >60)
Glucose, Bld: 119 mg/dL — ABNORMAL HIGH (ref 70–99)
Potassium: 3.6 mmol/L (ref 3.5–5.1)
Sodium: 134 mmol/L — ABNORMAL LOW (ref 135–145)
Total Bilirubin: 0.7 mg/dL (ref 0.0–1.2)
Total Protein: 4 g/dL — ABNORMAL LOW (ref 6.5–8.1)

## 2024-06-24 LAB — GLUCOSE, CAPILLARY
Glucose-Capillary: 136 mg/dL — ABNORMAL HIGH (ref 70–99)
Glucose-Capillary: 136 mg/dL — ABNORMAL HIGH (ref 70–99)

## 2024-06-24 MED ORDER — BISACODYL 5 MG PO TBEC: 5.0000 mg | DELAYED_RELEASE_TABLET | Freq: Every day | ORAL | Status: DC | PRN

## 2024-06-24 MED ORDER — SENNOSIDES-DOCUSATE SODIUM 8.6-50 MG PO TABS
1.0000 | ORAL_TABLET | Freq: Two times a day (BID) | ORAL | Status: DC
  Administered 2024-06-24 – 2024-07-05 (×21): 1 via ORAL
  Filled 2024-06-24 (×22): qty 1

## 2024-06-24 MED ORDER — AMIODARONE HCL 200 MG PO TABS
100.0000 mg | ORAL_TABLET | Freq: Every day | ORAL | Status: DC
  Administered 2024-06-25 – 2024-07-05 (×10): 100 mg via ORAL
  Filled 2024-06-24 (×11): qty 1

## 2024-06-24 NOTE — Progress Notes (Signed)
 Progress Note  Patient Name: Briana Fitzgerald Date of Encounter: 06/24/2024  Primary Cardiologist: Oneil Parchment, MD   Subjective   More alert difficult to understand speech converted to SB   Inpatient Medications    Scheduled Meds:  amiodarone  100 mg Oral Daily   apixaban  2.5 mg Oral BID   bethanechol  10 mg Oral TID   Chlorhexidine Gluconate Cloth  6 each Topical Daily   feeding supplement  237 mL Oral BID BM   melatonin  3 mg Oral QHS   midodrine  10 mg Oral Q8H   QUEtiapine  25 mg Oral QHS   valACYclovir  1,000 mg Oral QHS   Continuous Infusions:  cefTRIAXone (ROCEPHIN)  IV Stopped (06/23/24 1412)   PRN Meds: acetaminophen , docusate sodium , levalbuterol **AND** [DISCONTINUED] ipratropium, mouth rinse, polyethylene glycol   Vital Signs    Vitals:   06/24/24 0815 06/24/24 0830 06/24/24 0845 06/24/24 0900  BP: (!) 100/45 (!) 105/50 (!) 94/46 (!) 96/43  Pulse: (!) 55 (!) 56 (!) 55 (!) 56  Resp: 13 13 13 13   Temp:      TempSrc:      SpO2: 97% 97% 96% 95%  Weight:      Height:        Intake/Output Summary (Last 24 hours) at 06/24/2024 0923 Last data filed at 06/24/2024 0700 Gross per 24 hour  Intake 725.75 ml  Output 1250 ml  Net -524.25 ml   Filed Weights   06/20/24 0500 06/21/24 0500 06/22/24 0500  Weight: 48 kg 49 kg 49 kg    Telemetry    SR/SB rates 50-60 bpm   ECG    Afib with RVR with ST depressions anterolaterally - Personally Reviewed  Physical Exam   Elderly female Bruising on arms Distant Heart Sounds Abdomen benign No edema Lungs anterior crackles    Labs    Chemistry Recent Labs  Lab 06/18/24 1643 06/18/24 1955 06/20/24 0321 06/21/24 0337 06/24/24 0417  NA 139   < > 134* 133* 134*  K 3.6   < > 3.8 3.6 3.6  CL 102   < > 98 99 100  CO2 26   < > 22 23 25   GLUCOSE 241*   < > 127* 123* 119*  BUN 33*   < > 31* 29* 40*  CREATININE 1.43*   < > 1.20* 0.99 1.27*  CALCIUM  9.8   < > 9.2 9.3 9.5  PROT 5.7*  --   --   --  4.0*   ALBUMIN 2.8*  --   --  1.7* 1.6*  AST 62*  --   --   --  15  ALT 30  --   --   --  12  ALKPHOS 79  --   --   --  60  BILITOT 2.0*  --   --   --  0.7  GFRNONAA 35*   < > 43* 54* 40*  ANIONGAP 11   < > 14 11 9    < > = values in this interval not displayed.     Hematology Recent Labs  Lab 06/20/24 0321 06/21/24 0337 06/24/24 0417  WBC 13.3* 11.2* 8.9  RBC 4.91 4.62 3.90  HGB 13.5 12.5 10.6*  HCT 41.9 38.0 31.7*  MCV 85.3 82.3 81.3  MCH 27.5 27.1 27.2  MCHC 32.2 32.9 33.4  RDW 15.2 15.3 15.3  PLT 158 178 193      Radiology    No results found.  Cardiac Studies   Echo 06/19/24:   1. Poor Echo images.   2. Challenging study in the setting of tachycardia, HR 135 bpm.   3. Left ventricular ejection fraction, by estimation, is 50 to 55%. The  left ventricle has low normal function. The left ventricle has no regional  wall motion abnormalities. There is mild left ventricular hypertrophy.  Left ventricular diastolic function   could not be evaluated.   4. Right ventricular systolic function is normal. The right ventricular  size is mildly enlarged. There is normal pulmonary artery systolic  pressure.   5. The mitral valve is abnormal. Mild mitral valve regurgitation. No  evidence of mitral stenosis. Severe mitral annular calcification.   6. The aortic valve was not well visualized. There is moderate  calcification of the aortic valve. Aortic valve regurgitation is not  visualized. No aortic stenosis is present. Aortic valve mean gradient  measures 3.0 mmHg.   7. The inferior vena cava is dilated in size with >50% respiratory  variability, suggesting right atrial pressure of 8 mmHg.   Comparison(s): No significant change from prior study.     Patient Profile     89 y.o. female who is nonambulatory and living in a nursing facility with a history of HFpEF managed with diuretics, TIA, aortic root dilation, mild coronary calcifications on CT, aortoiliac  atherosclerotic calcifications, splenomegaly who presented on 10/3 with fever, hypotension, AMS, rash on left hand and septic shock and atrial fibrillation with RVR which developed in the emergency room. Initially treated with IV diltiazem but then developed hypotension. She was then treated with norepinephrine and IV amiodarone with IV fluids. Cardiology consulted for new atrial fibrillation management.   Assessment & Plan   Active Problems:   Chronic diastolic CHF (congestive heart failure) (HCC)   Aortic root dilatation   Atrial fibrillation with rapid ventricular response (HCC)   Moraxella catarrhalis pneumonia (HCC)   DNR (do not resuscitate)/DNI(Do Not Intubate)   Esophageal dysphagia   Septic shock Continues on levophed and received antibiotics New atrial fibrillation/flutter with RVR CHA2DS2-VASc: 7 (CHF, stroke, vascular disease, age, gender) Changed to oral amiodarone 100 mg daily after iv load  Careful hydration advised Started on renally dosed Eliquis 2.5 mg BID (age, weight). Patient is at risk of complications (fall risk, frail)  she is non ambulatory and living in nursing home so may be able to use on d/c  Converted to SR/SB hold oral amiodarone if HR < 60 bpm Not on any AV nodal drugs given age and bradycardia  Shingles of left hand UTI AKI Lactic acidosis, resolved Normocytic anemia Thrombocytopenia Chronic HFpEF Hypokalemia Asymptomatic Coronary/aortic atherosclerosis Splenomegaly Dilated ascending aorta   For questions or updates, please contact [facility] Please consult [email or web address] for contact info under        Signed, Maude Emmer, MD  06/24/2024, 9:23 AM

## 2024-06-24 NOTE — Plan of Care (Signed)
   Problem: Nutrition: Goal: Adequate nutrition will be maintained Outcome: Progressing   Problem: Pain Managment: Goal: General experience of comfort will improve and/or be controlled Outcome: Progressing   Problem: Safety: Goal: Ability to remain free from injury will improve Outcome: Progressing

## 2024-06-24 NOTE — Progress Notes (Signed)
 PROGRESS NOTE  Briana Fitzgerald  DOB: 1932/07/25  PCP: Micheal Wolm ORN, MD FMW:[id number]  DOA: 06/18/2024  LOS: 6 days  Hospital Day: 7  Subjective: Patient was seen and examined this morning. Elderly Caucasian female.  Comfortable in bed.  Opens eyes to command. Alert but very soft and weak voice, difficult to understand Tries to follow motor commands but limited because of weakness. On low-flow oxygen. Family not at bedside  Brief narrative: Briana Fitzgerald is a 89 y.o. female with PMH significant for mild dementia, diastolic CHF, TIA. Lives at [facility] assisted living facility, hard of hearing, alert and oriented at baseline. 10/3, patient was brought to the ED with complaint of progressive rash on her left hand for 3 to 4 days and altered mental status on the day of presentation.  In the ED, she had a fever of 101.9 Patient was noted to have vesicular rash on the left hand and forearm consistent with shingles While in the ED, she went to A-fib with RVR with heart rate of 170-200.  Rate was controlled with Cardizem bolus and drip.  But blood pressure dropped and she had to be started on Levophed drip, amiodarone drip. Chest x-ray showed bibasilar opacities, left upper lobe opacity. CT head did not show any acute abnormality, showed chronic ischemic microvascular changes with mild cerebral volume loss. Started on empiric IV antibiotics DNR/DNI status confirmed with family. Admitted to ICU for septic shock, rapid A-fib  While in ICU, patient was delirious and required Precedex drip for about 48 hours.  Was able to come off Levophed in about 72 hours Blood culture sent on admission grew Moraxella catarrhalis. 10/8, transferred out to [facility].  Assessment and plan: Septic shock POA Multifocal pneumonia Moraxella catarrhalis bacteremia Patient presented in septic shock due to pneumonia requiring patient to ICU on pressors. Chest x-ray showed multifocal pneumonia Blood  cultures in the admission grew Moraxella catarrhalis Currently on IV Rocephin Hemodynamically stable.  Off pressors No fever in last several days. WBC count normalized, lactic acid level normalized Recent Labs  Lab 06/18/24 1711 06/18/24 1955 06/18/24 2006 06/19/24 0328 06/20/24 0321 06/21/24 0337 06/24/24 0417  WBC  --  10.4  --  18.6* 13.3* 11.2* 8.9  LATICACIDVEN 2.6*  --  1.6  --   --   --   --    A-fib with RVR New onset A-fib Developed RVR in the setting of septic shock Initially started on IV amiodarone drip.  Cardiology was consulted It seems patient converted to normal sinus rhythm last night.  Noted initiation of oral amiodarone today. Started on anticoagulation with Eliquis 2.5 mg twice daily  Shingles on the left hand Currently on valacyclovir 1 g daily  Acute metabolic encephalopathy Underlying mild dementia While in ICU, patient was delirious and required Precedex drip for about 48 hours. Currently alert, awake but very weak to have a conversation Currently on Seroquel 25 mg nightly, melatonin 3 mg nightly  Dysphagia Seen by ST. High risk of aspiration Currently on dysphagia 1 diet   AKI on CKD 2 Baseline creatinine normal. Creatinine elevated in the setting of septic shock Gradually improving.  But noted to rise in the last 3 days. Recent Labs    01/21/24 1448 04/14/24 1107 04/16/24 1441 05/28/24 1355 06/18/24 1643 06/18/24 1955 06/19/24 0945 06/19/24 1705 06/20/24 0321 06/21/24 0337 06/24/24 0417  BUN 20 24* 27* 23 33*  --  31* 32* 31* 29* 40*  CREATININE 1.02 1.14 1.11 0.89 1.43* 1.38*  1.40* 0.44 1.20* 0.99 1.27*  CO2 34* 35* 34* 30 26  --  25 25 22 23 25    Chronic diastolic CHF Bilateral lower extremity edema Patient has bilateral lower EXTR edema and low albumin of 1.6. PTA meds- torsemide  which is currently on hold Given uptrending BUN/creatinine, I would not resume torsemide  at this time.  Continue to monitor.  Encourage oral  hydration and nutrition  Hypoalbuminemia Albumin low at 1.6.  Poor nutrition, starting to have anasarca consult dietitian  Restless legs Was on pramipexole  0.5 mg nightly.  Currently on hold   Mobility:  PT Orders: Active   PT Follow up Rec: Skilled Nursing-Short Term Rehab (<3 Hours/Day)06/22/2024 1322    Goals of care   Code Status: Limited: Do not attempt resuscitation (DNR) -DNR-LIMITED -Do Not Intubate/DNI   Palliative care consult appreciated   DVT prophylaxis:  apixaban (ELIQUIS) tablet 2.5 mg Start: 06/19/24 1015 SCDs Start: 06/18/24 1918 SCDs Start: 06/18/24 1908 apixaban (ELIQUIS) tablet 2.5 mg   Antimicrobials: Valacyclovir, Rocephin Fluid: None Consultants: Cardiology Family Communication: None at bedside  Status: Inpatient Level of care:  Telemetry Medical   Patient is from: Home Needs to continue in-hospital care: Remains very weak.  Ultimately SNF    Diet:  Diet Order             DIET - DYS 1 Room service appropriate? No; Fluid consistency: Thin  Diet effective now                   Scheduled Meds:  amiodarone  100 mg Oral Daily   apixaban  2.5 mg Oral BID   bethanechol  10 mg Oral TID   Chlorhexidine Gluconate Cloth  6 each Topical Daily   feeding supplement  237 mL Oral BID BM   melatonin  3 mg Oral QHS   midodrine  10 mg Oral Q8H   QUEtiapine  25 mg Oral QHS   valACYclovir  1,000 mg Oral QHS    PRN meds: acetaminophen , docusate sodium , levalbuterol **AND** [DISCONTINUED] ipratropium, mouth rinse, polyethylene glycol   Infusions:   cefTRIAXone (ROCEPHIN)  IV 2 g (06/24/24 1452)    Antimicrobials: Anti-infectives (From admission, onward)    Start     Dose/Rate Route Frequency Ordered Stop   06/21/24 2200  valACYclovir (VALTREX) tablet 1,000 mg        1,000 mg Oral Daily at bedtime 06/21/24 0951 06/24/24 2359   06/21/24 1430  cefTRIAXone (ROCEPHIN) 2 g in sodium chloride  0.9 % 100 mL IVPB        2 g 200 mL/hr over 30  Minutes Intravenous Every 24 hours 06/21/24 0945 06/24/24 2359   06/20/24 2200  acyclovir (ZOVIRAX) 480 mg in dextrose 5 % 100 mL IVPB  Status:  Discontinued        10 mg/kg  48 kg 109.6 mL/hr over 60 Minutes Intravenous Every 24 hours 06/20/24 0931 06/21/24 0951   06/19/24 1415  cefTRIAXone (ROCEPHIN) 2 g in sodium chloride  0.9 % 100 mL IVPB  Status:  Discontinued        2 g 200 mL/hr over 30 Minutes Intravenous Every 24 hours 06/19/24 1324 06/19/24 1334   06/19/24 1415  doxycycline  (VIBRAMYCIN ) 100 mg in sodium chloride  0.9 % 250 mL IVPB  Status:  Discontinued        100 mg 125 mL/hr over 120 Minutes Intravenous Every 12 hours 06/19/24 1324 06/19/24 1334   06/19/24 1415  cefTRIAXone (ROCEPHIN) 2 g in sodium chloride  0.9 %  100 mL IVPB  Status:  Discontinued        2 g 200 mL/hr over 30 Minutes Intravenous Every 24 hours 06/19/24 1334 06/21/24 0945   06/19/24 1415  doxycycline  (VIBRAMYCIN ) 100 mg in sodium chloride  0.9 % 250 mL IVPB  Status:  Discontinued        100 mg 125 mL/hr over 120 Minutes Intravenous Every 12 hours 06/19/24 1334 06/21/24 0945   06/18/24 2015  acyclovir (ZOVIRAX) 590 mg in dextrose 5 % 100 mL IVPB  Status:  Discontinued        10 mg/kg  59 kg 111.8 mL/hr over 60 Minutes Intravenous Every 24 hours 06/18/24 2013 06/20/24 0931   06/18/24 1830  doxycycline  (VIBRAMYCIN ) 100 mg in sodium chloride  0.9 % 250 mL IVPB        100 mg 125 mL/hr over 120 Minutes Intravenous  Once 06/18/24 1823 06/18/24 2158   06/18/24 1815  cefTRIAXone (ROCEPHIN) 1 g in sodium chloride  0.9 % 100 mL IVPB        1 g 200 mL/hr over 30 Minutes Intravenous  Once 06/18/24 1812 06/18/24 1928   06/18/24 1815  azithromycin  (ZITHROMAX ) 500 mg in sodium chloride  0.9 % 250 mL IVPB  Status:  Discontinued        500 mg 250 mL/hr over 60 Minutes Intravenous  Once 06/18/24 1812 06/18/24 1823       Objective: Vitals:   06/24/24 1215 06/24/24 1230  BP: (!) 106/49 (!) 106/48  Pulse: (!) 56 (!) 56  Resp:  13 11  Temp:    SpO2: 96% 95%    Intake/Output Summary (Last 24 hours) at 06/24/2024 1452 Last data filed at 06/24/2024 1407 Gross per 24 hour  Intake 725.75 ml  Output 1250 ml  Net -524.25 ml   Filed Weights   06/20/24 0500 06/21/24 0500 06/22/24 0500  Weight: 48 kg 49 kg 49 kg   Weight change:  Body mass index is 18.84 kg/m.   Physical Exam: General exam: Pleasant, elderly.  Not in pain Skin: No rashes, lesions or ulcers. HEENT: Atraumatic, normocephalic, no obvious bleeding Lungs: Diminished air entry on both sides CVS: S1, S2, no murmur,   GI/Abd: Soft, nontender, nondistended, bowel sound present,   CNS: Alert, awake, very weak Psychiatry: sad affect Extremities: Trace to 1+ bilateral pedal edema, no calf tenderness,   Data Review: I have personally reviewed the laboratory data and studies available.  F/u labs ordered Unresulted Labs (From admission, onward)     Start     Ordered   Unscheduled  Basic metabolic panel with GFR  Tomorrow morning,   R       Question:  Specimen collection method  Answer:  Lab=Lab collect   06/24/24 1452   Unscheduled  CBC with Differential/Platelet  Tomorrow morning,   R       Question:  Specimen collection method  Answer:  Lab=Lab collect   06/24/24 1452            Signed, Chapman Rota, MD [facility] 06/24/2024

## 2024-06-25 DIAGNOSIS — A419 Sepsis, unspecified organism: Secondary | ICD-10-CM | POA: Diagnosis not present

## 2024-06-25 DIAGNOSIS — R6521 Severe sepsis with septic shock: Secondary | ICD-10-CM | POA: Diagnosis not present

## 2024-06-25 DIAGNOSIS — Z7189 Other specified counseling: Secondary | ICD-10-CM | POA: Diagnosis not present

## 2024-06-25 DIAGNOSIS — Z515 Encounter for palliative care: Secondary | ICD-10-CM | POA: Diagnosis not present

## 2024-06-25 DIAGNOSIS — Z789 Other specified health status: Secondary | ICD-10-CM | POA: Diagnosis not present

## 2024-06-25 LAB — CBC WITH DIFFERENTIAL/PLATELET
Abs Immature Granulocytes: 0.14 K/uL — ABNORMAL HIGH (ref 0.00–0.07)
Basophils Absolute: 0 K/uL (ref 0.0–0.1)
Basophils Relative: 0 %
Eosinophils Absolute: 0.1 K/uL (ref 0.0–0.5)
Eosinophils Relative: 1 %
HCT: 31.5 % — ABNORMAL LOW (ref 36.0–46.0)
Hemoglobin: 10.4 g/dL — ABNORMAL LOW (ref 12.0–15.0)
Immature Granulocytes: 2 %
Lymphocytes Relative: 16 %
Lymphs Abs: 1.3 K/uL (ref 0.7–4.0)
MCH: 27.2 pg (ref 26.0–34.0)
MCHC: 33 g/dL (ref 30.0–36.0)
MCV: 82.5 fL (ref 80.0–100.0)
Monocytes Absolute: 0.7 K/uL (ref 0.1–1.0)
Monocytes Relative: 8 %
Neutro Abs: 5.9 K/uL (ref 1.7–7.7)
Neutrophils Relative %: 73 %
Platelets: 226 K/uL (ref 150–400)
RBC: 3.82 MIL/uL — ABNORMAL LOW (ref 3.87–5.11)
RDW: 15.2 % (ref 11.5–15.5)
WBC: 8.1 K/uL (ref 4.0–10.5)
nRBC: 0 % (ref 0.0–0.2)

## 2024-06-25 LAB — GLUCOSE, CAPILLARY: Glucose-Capillary: 110 mg/dL — ABNORMAL HIGH (ref 70–99)

## 2024-06-25 LAB — BASIC METABOLIC PANEL WITH GFR
Anion gap: 11 (ref 5–15)
BUN: 39 mg/dL — ABNORMAL HIGH (ref 8–23)
CO2: 25 mmol/L (ref 22–32)
Calcium: 9.5 mg/dL (ref 8.9–10.3)
Chloride: 100 mmol/L (ref 98–111)
Creatinine, Ser: 1.19 mg/dL — ABNORMAL HIGH (ref 0.44–1.00)
GFR, Estimated: 43 mL/min — ABNORMAL LOW (ref >60)
Glucose, Bld: 106 mg/dL — ABNORMAL HIGH (ref 70–99)
Potassium: 4 mmol/L (ref 3.5–5.1)
Sodium: 136 mmol/L (ref 135–145)

## 2024-06-25 LAB — CULTURE, BLOOD (ROUTINE X 2)
Culture: NO GROWTH
Culture: NO GROWTH
Special Requests: ADEQUATE

## 2024-06-25 MED ORDER — POTASSIUM CHLORIDE 20 MEQ PO PACK
40.0000 meq | PACK | Freq: Once | ORAL | Status: AC
  Administered 2024-06-25: 40 meq via ORAL
  Filled 2024-06-25: qty 2

## 2024-06-25 MED ORDER — FUROSEMIDE 10 MG/ML IJ SOLN
40.0000 mg | Freq: Two times a day (BID) | INTRAMUSCULAR | Status: AC
  Administered 2024-06-25 – 2024-06-26 (×2): 40 mg via INTRAVENOUS
  Filled 2024-06-25 (×2): qty 4

## 2024-06-25 NOTE — Progress Notes (Signed)
 Initial Nutrition Assessment  DOCUMENTATION CODES:  Severe malnutrition in context of chronic illness  INTERVENTION:  Continue dysphagia 1 diet for ease of consuming meals Ensure Plus High Protein po BID, each supplement provides 350 kcal and 20 grams of protein. Magic cup TID with meals, each supplement provides 290 kcal and 9 grams of protein Recommend escalation of bowel regimen given no BM documented x6 days  NUTRITION DIAGNOSIS:  Severe Malnutrition related to chronic illness (dementia, CHF) as evidenced by severe fat depletion, severe muscle depletion.  GOAL:  Patient will meet greater than or equal to 90% of their needs  MONITOR:  PO intake, Supplement acceptance, Labs, Weight trends  REASON FOR ASSESSMENT:  Consult Assessment of nutrition requirement/status  ASSESSMENT:  Pt admitted from ALF with c/o rash and AMS. PMH significant for mild dementia, diastolic CHF, TIA.  Palliative care following. Per discussion with pt's son, she has required rolling walker after a sacral fracture ~1.5 years ago. Her memory  and comprehension has worsened over the last few weeks.   Pt sitting up in bedside chair at time of visit. She is a limited historian and hard of hearing. She denies having much of an appetite. She does feel that liquids are easier to consume and is amenable to continuing Ensure.   Pt has been on dysphagia 1 diet since admission. Of note per IPAL, her son reports she is unable to feed herself.   Meal completions: 10/7: 50% breakfast, 25% lunch, 25% dinner 10/8: 25% breakfast, 25% lunch, 25% dinner 10/9: 80% lunch  10/10: 15% breakfast  Admit weight: 47 kg Current weight: 49 kg Documented weight in May was 59.1 kg. If accurate, pt's experienced a weight loss of 17.1% which is clinically significant for time frame.   Medications: lasix  40mg  BID, senna BID  Labs:  BUN 39 Cr 1.19 GFR 43 CBG's 110, 136 x24 hours   NUTRITION - FOCUSED PHYSICAL  EXAM:  Flowsheet Row Most Recent Value  Orbital Region Severe depletion  Upper Arm Region Moderate depletion  Thoracic and Lumbar Region Severe depletion  Buccal Region Severe depletion  Temple Region Severe depletion  Clavicle Bone Region Severe depletion  Clavicle and Acromion Bone Region Severe depletion  Scapular Bone Region Severe depletion  Dorsal Hand Severe depletion  [LUE moderate edema]  Patellar Region Unable to assess  [deep pitting edema]  Anterior Thigh Region Unable to assess  Posterior Calf Region Unable to assess  Edema (RD Assessment) Severe  [BLE]  Hair Reviewed  Eyes Reviewed  Mouth Reviewed  Skin Reviewed  Nails Reviewed    Diet Order:   Diet Order             DIET - DYS 1 Room service appropriate? No; Fluid consistency: Thin  Diet effective now                   EDUCATION NEEDS:   No education needs have been identified at this time  Skin:  Skin Assessment: Reviewed RN Assessment  Last BM:  PTA  Height:   Ht Readings from Last 1 Encounters:  06/18/24 5' 3.5 (1.613 m)    Weight:   Wt Readings from Last 1 Encounters:  06/22/24 49 kg   BMI:  Body mass index is 18.84 kg/m.  Estimated Nutritional Needs:   Kcal:  1200-1400  Protein:  60-70g  Fluid:  1.2-1.5L  Royce Maris, RDN, LDN Clinical Nutrition See AMiON for contact information.

## 2024-06-25 NOTE — Progress Notes (Signed)
 Physical Therapy Treatment Patient Details Name: Briana Fitzgerald MRN: [id number] DOB: [birth year] Today's Date: 06/25/2024   History of Present Illness Patient is a 89 y/o female admitted 06/18/24 with AMS and rash on her hand.  Found to have Shingles on L hand and became hypotensive and noted A-fib.  Also found to have L sided pneumonia. PMH positive for TIA, CHF, RLS, back pain, OA, sleep apnea, bowel obstruction and mild dementia.    PT Comments  Pt asleep on entry, needing increased stimulation to rouse. Pt agreeable to getting up to chair. Pt initiates commands with increased time, but immediately stops when PT provides assist. Pt ultimately total A for bed mobility and squat pivot transfer to recliner. Pt with increased L lateral lean with sitting in recliner, difficult to overcome even with bolstering with pillows. D/c plans remain appropriate. PT will continue to follow acutely.    If plan is discharge home, recommend the following: Two people to help with bathing/dressing/bathroom;Two people to help with walking and/or transfers   Can travel by private vehicle      No  Equipment Recommendations  None recommended by PT    Recommendations for Other Services       Precautions / Restrictions Precautions Precautions: Fall Precaution/Restrictions Comments: watch BP Restrictions Weight Bearing Restrictions Per Provider Order: No     Mobility  Bed Mobility Overal bed mobility: Needs Assistance Bed Mobility: Supine to Sit, Sit to Supine     Supine to sit: Total assist, HOB elevated, Used rails     General bed mobility comments: pt initiates movement but ultimately needs total A for reaching to rail managing LE off bed and pad scoot of hips to EOB and trunk to upright,    Transfers Overall transfer level: Needs assistance Equipment used: 1 person hand held assist Transfers: Bed to chair/wheelchair/BSC       Squat pivot transfers: Total assist, From elevated surface      General transfer comment: pt verbalizes understanding of plan, initates movement but as soon as PT assists stops all assistance requiring total A for pivoting to chair        Balance Overall balance assessment: Needs assistance Sitting-balance support: Feet unsupported, Feet supported Sitting balance-Leahy Scale: Poor Sitting balance - Comments: requires assist to maintain balance from modA to minA                                    Communication Communication Communication: Impaired Factors Affecting Communication: Hearing impaired  Cognition Arousal: Alert Behavior During Therapy: Flat affect   PT - Cognitive impairments: History of cognitive impairments, No family/caregiver present to determine baseline                       PT - Cognition Comments: mild dementia per chart, pt very HOH Following commands: Impaired Following commands impaired: Follows one step commands inconsistently (combination of HOH and cognition)    Cueing Cueing Techniques: Verbal cues, Tactile cues, Gestural cues     General Comments General comments (skin integrity, edema, etc.): BP 112/51 in supine, HR in 80s after transfer to chair      Pertinent Vitals/Pain Pain Assessment Pain Assessment: Faces Faces Pain Scale: Hurts even more Breathing: normal Negative Vocalization: none Facial Expression: smiling or inexpressive Body Language: relaxed Consolability: no need to console PAINAD Score: 0 Pain Location: back with transfer to recliner, before bolstered with pillows  Pain Descriptors / Indicators: Aching, Discomfort, Grimacing, Guarding, Moaning Pain Intervention(s): Limited activity within patient's tolerance, Monitored during session, Repositioned     PT Goals (current goals can now be found in the care plan section) Acute Rehab PT Goals Patient Stated Goal: none stated, family in palliative conversations PT Goal Formulation: Patient unable to participate in  goal setting Time For Goal Achievement: 07/06/24 Potential to Achieve Goals: Fair Progress towards PT goals: Progressing toward goals    Frequency    Min 1X/week       AM-PAC PT 6 Clicks Mobility   Outcome Measure  Help needed turning from your back to your side while in a flat bed without using bedrails?: Total Help needed moving from lying on your back to sitting on the side of a flat bed without using bedrails?: Total Help needed moving to and from a bed to a chair (including a wheelchair)?: Total Help needed standing up from a chair using your arms (e.g., wheelchair or bedside chair)?: Total Help needed to walk in hospital room?: Total Help needed climbing 3-5 steps with a railing? : Total 6 Click Score: 6    End of Session Equipment Utilized During Treatment: Gait belt;Oxygen Activity Tolerance: Patient limited by fatigue Patient left: in bed;with call bell/phone within reach Nurse Communication: Mobility status;Need for lift equipment PT Visit Diagnosis: Muscle weakness (generalized) (M62.81);Other abnormalities of gait and mobility (R26.89)     Time: 8888-8851 PT Time Calculation (min) (ACUTE ONLY): 37 min  Charges:    $Therapeutic Activity: 23-37 mins PT General Charges $$ ACUTE PT VISIT: 1 Visit                     Barbara Keng B. Fleeta Lapidus PT, DPT Acute Rehabilitation Services Please use secure chat or  Call Office [phone]    Briana Fitzgerald 06/25/2024, 11:54 AM

## 2024-06-25 NOTE — Progress Notes (Addendum)
 PROGRESS NOTE    Briana Fitzgerald  FMW:[id number] DOB: 09-23-1931 DOA: 06/18/2024 PCP: Micheal Wolm ORN, MD   89 y.o. female with PMH significant for mild dementia, diastolic CHF, TIA. Lives at [facility] assisted living facility, hard of hearing, alert and oriented at baseline. 10/3, patient was brought to the ED with complaint of a rash on her hand and altered mental status.  In the ED she was febrile with septic shock, complicated by A-fib RVR and hypotension Chest x-ray showed bibasilar opacities, left upper lobe opacity. -Treated with pressor support in ICU, Amio gtt. Blood cultures grew Moraxella catarrhalis  While in ICU, patient was delirious and required Precedex drip for about 48 hours.  Was able to come off Levophed in about 72 hours 10/8, transferred out to [facility].   Dr. Arlice and Chen's notes reviewed    Subjective: -A little confused no events overnight  Assessment and Plan:  Septic shock POA Multifocal pneumonia Moraxella catarrhalis bacteremia Patient presented in septic shock due to pneumonia requiring patient to ICU on pressors. Chest x-ray showed multifocal pneumonia Blood cultures in the admission grew Moraxella catarrhalis Completed 7 days of IV ceftriaxone, repeat blood cultures are negative -Continue midodrine - SLP following for dysphagia  A-fib with RVR New onset A-fib Developed RVR in the setting of septic shock Cards following, started on IV amiodarone, now switched to p.o. Started on anticoagulation with Eliquis 2.5 mg twice daily  Acute on chronic diastolic CHF Bilateral lower extremity edema Patient has bilateral lower EXTR edema and low albumin of 1.6. PTA meds- torsemide  which is currently on hold - She has fair bit of flank edema from third spacing -Start IV Lasix  today  Severe hypoalbuminemia and malnutrition -Prognosis is poor, palliative care following   Left forearm laceration -Clinically do not suspect shingles   Acute metabolic  encephalopathy Underlying mild dementia While in ICU, patient was delirious and required Precedex drip for about 48 hours. Currently on Seroquel 25 mg nightly, melatonin 3 mg nightly   Dysphagia Seen by ST. High risk of aspiration Currently on dysphagia 1 diet   AKI on CKD 2 Baseline creatinine normal. Creatinine elevated in the setting of septic shock Now improving  Recurrent urinary retention - Foley catheter placed on 10/7, now on bethanechol - Attempt voiding trial when she is more ambulatory   Hypoalbuminemia Albumin low at 1.6.  Poor nutrition, starting to have anasarca consult dietitian   Restless legs Was on pramipexole  0.5 mg nightly.  Currently on hold   DVT prophylaxis: Apixaban Code Status: DNR Family Communication: No family at bedside Disposition Plan: SNF with palliative care if she stabilizes   Antimicrobials:    Objective: Vitals:   06/25/24 0849 06/25/24 0900 06/25/24 1000 06/25/24 1100  BP:  (!) 131/57 (!) 115/45 (!) 109/47  Pulse:  73 68 67  Resp:  (!) 25 (!) 21 (!) 22  Temp: 98.7 F (37.1 C)     TempSrc: Oral     SpO2:  96% 96% 97%  Weight:      Height:        Intake/Output Summary (Last 24 hours) at 06/25/2024 1159 Last data filed at 06/25/2024 0608 Gross per 24 hour  Intake 220 ml  Output 1300 ml  Net -1080 ml   Filed Weights   06/20/24 0500 06/21/24 0500 06/22/24 0500  Weight: 48 kg 49 kg 49 kg    Examination:  General exam: Chronic ill frail cachectic, awake alert, oriented to self only Respiratory system:  Decreased breath sounds at the bases Cardiovascular system: S1 & S2 heard, RRR.  Abd: nondistended, soft and nontender.Normal bowel sounds heard. Extremities: Edema in flanks Skin: No rashes Psychiatry: Flat affect    Data Reviewed:   CBC: Recent Labs  Lab 06/18/24 1643 06/18/24 1955 06/19/24 0328 06/20/24 0321 06/21/24 0337 06/24/24 0417 06/25/24 0712  WBC 10.2   < > 18.6* 13.3* 11.2* 8.9 8.1  NEUTROABS  8.5*  --   --   --   --  6.5 5.9  HGB 12.1   < > 14.9 13.5 12.5 10.6* 10.4*  HCT 38.1   < > 46.5* 41.9 38.0 31.7* 31.5*  MCV 85.4   < > 86.4 85.3 82.3 81.3 82.5  PLT 144*   < > 155 158 178 193 226   < > = values in this interval not displayed.   Basic Metabolic Panel: Recent Labs  Lab 06/19/24 0945 06/19/24 1705 06/20/24 0321 06/21/24 0337 06/24/24 0417 06/25/24 0712  NA 137 140 134* 133* 134* 136  K 3.4* 3.2* 3.8 3.6 3.6 4.0  CL 103 102 98 99 100 100  CO2 25 25 22 23 25 25   GLUCOSE 151* 110* 127* 123* 119* 106*  BUN 31* 32* 31* 29* 40* 39*  CREATININE 1.40* 0.44 1.20* 0.99 1.27* 1.19*  CALCIUM  9.5 8.8* 9.2 9.3 9.5 9.5  MG 1.6*  --  1.9 1.7  --   --   PHOS  --   --  3.0 3.4  --   --    GFR: Estimated Creatinine Clearance: 24.3 mL/min (A) (by C-G formula based on SCr of 1.19 mg/dL (H)). Liver Function Tests: Recent Labs  Lab 06/18/24 1643 06/21/24 0337 06/24/24 0417  AST 62*  --  15  ALT 30  --  12  ALKPHOS 79  --  60  BILITOT 2.0*  --  0.7  PROT 5.7*  --  4.0*  ALBUMIN 2.8* 1.7* 1.6*   No results for input(s): LIPASE, AMYLASE in the last 168 hours. No results for input(s): AMMONIA in the last 168 hours. Coagulation Profile: No results for input(s): INR, PROTIME in the last 168 hours. Cardiac Enzymes: No results for input(s): CKTOTAL, CKMB, CKMBINDEX, TROPONINI in the last 168 hours. BNP (last 3 results) No results for input(s): PROBNP in the last 8760 hours. HbA1C: No results for input(s): HGBA1C in the last 72 hours. CBG: Recent Labs  Lab 06/18/24 2038 06/24/24 2003 06/24/24 2333 06/25/24 0332  GLUCAP 179* 136* 136* 110*   Lipid Profile: No results for input(s): CHOL, HDL, LDLCALC, TRIG, CHOLHDL, LDLDIRECT in the last 72 hours. Thyroid  Function Tests: No results for input(s): TSH, T4TOTAL, FREET4, T3FREE, THYROIDAB in the last 72 hours. Anemia Panel: No results for input(s): VITAMINB12, FOLATE,  FERRITIN, TIBC, IRON, RETICCTPCT in the last 72 hours. Urine analysis:    Component Value Date/Time   COLORURINE YELLOW 06/18/2024 1702   APPEARANCEUR HAZY (A) 06/18/2024 1702   LABSPEC 1.010 06/18/2024 1702   PHURINE 5.0 06/18/2024 1702   GLUCOSEU NEGATIVE 06/18/2024 1702   HGBUR NEGATIVE 06/18/2024 1702   BILIRUBINUR NEGATIVE 06/18/2024 1702   BILIRUBINUR Negative 03/28/2023 1607   KETONESUR NEGATIVE 06/18/2024 1702   PROTEINUR NEGATIVE 06/18/2024 1702   UROBILINOGEN 0.2 03/28/2023 1607   NITRITE NEGATIVE 06/18/2024 1702   LEUKOCYTESUR SMALL (A) 06/18/2024 1702   Sepsis Labs: @LABRCNTIP (procalcitonin:4,lacticidven:4)  ) Recent Results (from the past 240 hours)  Blood culture (routine x 2)     Status: Abnormal   Collection  Time: 06/18/24  5:00 PM   Specimen: BLOOD  Result Value Ref Range Status   Specimen Description BLOOD RIGHT ANTECUBITAL  Final   Special Requests   Final    BOTTLES DRAWN AEROBIC AND ANAEROBIC Blood Culture results may not be optimal due to an inadequate volume of blood received in culture bottles   Culture  Setup Time   Final    GRAM NEGATIVE COCCI IN BOTH AEROBIC AND ANAEROBIC BOTTLES CRITICAL VALUE NOTED.  VALUE IS CONSISTENT WITH PREVIOUSLY REPORTED AND CALLED VALUE. CORRECTED RESULTS PREVIOUSLY REPORTED AS: GRAM VARIABLE ROD CRITICAL RESULT CALLED TO, READ BACK BY AND VERIFIED WITH: PHARMD B.WANNARAT AT 9061 ON 06/21/2024 BY T.SAAD.    Culture (A)  Final    MORAXELLA CATARRHALIS(BRANHAMELLA) BETA LACTAMASE POSITIVE    Report Status 06/21/2024 FINAL  Final  Resp panel by RT-PCR (RSV, Flu A&B, Covid) Urine, Clean Catch     Status: None   Collection Time: 06/18/24  5:02 PM   Specimen: Urine, Clean Catch; Nasal Swab  Result Value Ref Range Status   SARS Coronavirus 2 by RT PCR NEGATIVE NEGATIVE Final   Influenza A by PCR NEGATIVE NEGATIVE Final   Influenza B by PCR NEGATIVE NEGATIVE Final    Comment: (NOTE) The Xpert Xpress  SARS-CoV-2/FLU/RSV plus assay is intended as an aid in the diagnosis of influenza from Nasopharyngeal swab specimens and should not be used as a sole basis for treatment. Nasal washings and aspirates are unacceptable for Xpert Xpress SARS-CoV-2/FLU/RSV testing.  Fact Sheet for Patients: BloggerCourse.com  Fact Sheet for Healthcare Providers: SeriousBroker.it  This test is not yet approved or cleared by the [place]  FDA and has been authorized for detection and/or diagnosis of SARS-CoV-2 by FDA under an Emergency Use Authorization (EUA). This EUA will remain in effect (meaning this test can be used) for the duration of the COVID-19 declaration under Section 564(b)(1) of the Act, 21 U.S.C. section 360bbb-3(b)(1), unless the authorization is terminated or revoked.     Resp Syncytial Virus by PCR NEGATIVE NEGATIVE Final    Comment: (NOTE) Fact Sheet for Patients: BloggerCourse.com  Fact Sheet for Healthcare Providers: SeriousBroker.it  This test is not yet approved or cleared by the [place]  FDA and has been authorized for detection and/or diagnosis of SARS-CoV-2 by FDA under an Emergency Use Authorization (EUA). This EUA will remain in effect (meaning this test can be used) for the duration of the COVID-19 declaration under Section 564(b)(1) of the Act, 21 U.S.C. section 360bbb-3(b)(1), unless the authorization is terminated or revoked.  Performed at [facility], [address], [place], [place] [address]   Blood culture (routine x 2)     Status: Abnormal   Collection Time: 06/18/24  5:18 PM   Specimen: BLOOD RIGHT ARM  Result Value Ref Range Status   Specimen Description BLOOD RIGHT ARM  Final   Special Requests   Final    BOTTLES DRAWN AEROBIC AND ANAEROBIC Blood Culture results may not be optimal due to an inadequate volume of blood received in culture  bottles   Culture  Setup Time   Final    GRAM NEGATIVE COCCI IN BOTH AEROBIC AND ANAEROBIC BOTTLES CRITICAL RESULT CALLED TO, READ BACK BY AND VERIFIED WITH: PHARMD J.MILLEN AT 1157 ON 06/19/2024 BY T.SAAD. PREVIOUSLY REPORTED AS: GRAM VARIABLE ROD CORRECTED RESULTS CALLED TO: PHARMD B.WANNARAT AT 9061 ON 06/21/2024 BY T.SAAD.    Culture (A)  Final    MORAXELLA CATARRHALIS(BRANHAMELLA) BETA LACTAMASE POSITIVE Performed at  [facility], [address] [place], [place] [address]    Report Status 06/21/2024 FINAL  Final  Blood Culture ID Panel (Reflexed)     Status: None   Collection Time: 06/18/24  5:18 PM  Result Value Ref Range Status   Enterococcus faecalis NOT DETECTED NOT DETECTED Final   Enterococcus Faecium NOT DETECTED NOT DETECTED Final   Listeria monocytogenes NOT DETECTED NOT DETECTED Final   Staphylococcus species NOT DETECTED NOT DETECTED Final   Staphylococcus aureus (BCID) NOT DETECTED NOT DETECTED Final   Staphylococcus epidermidis NOT DETECTED NOT DETECTED Final   Staphylococcus lugdunensis NOT DETECTED NOT DETECTED Final   Streptococcus species NOT DETECTED NOT DETECTED Final   Streptococcus agalactiae NOT DETECTED NOT DETECTED Final   Streptococcus pneumoniae NOT DETECTED NOT DETECTED Final   Streptococcus pyogenes NOT DETECTED NOT DETECTED Final   A.calcoaceticus-baumannii NOT DETECTED NOT DETECTED Final   Bacteroides fragilis NOT DETECTED NOT DETECTED Final   Enterobacterales NOT DETECTED NOT DETECTED Final   Enterobacter cloacae complex NOT DETECTED NOT DETECTED Final   Escherichia coli NOT DETECTED NOT DETECTED Final   Klebsiella aerogenes NOT DETECTED NOT DETECTED Final   Klebsiella oxytoca NOT DETECTED NOT DETECTED Final   Klebsiella pneumoniae NOT DETECTED NOT DETECTED Final   Proteus species NOT DETECTED NOT DETECTED Final   Salmonella species NOT DETECTED NOT DETECTED Final   Serratia marcescens NOT DETECTED NOT DETECTED Final   Haemophilus  influenzae NOT DETECTED NOT DETECTED Final   Neisseria meningitidis NOT DETECTED NOT DETECTED Final   Pseudomonas aeruginosa NOT DETECTED NOT DETECTED Final   Stenotrophomonas maltophilia NOT DETECTED NOT DETECTED Final   Candida albicans NOT DETECTED NOT DETECTED Final   Candida auris NOT DETECTED NOT DETECTED Final   Candida glabrata NOT DETECTED NOT DETECTED Final   Candida krusei NOT DETECTED NOT DETECTED Final   Candida parapsilosis NOT DETECTED NOT DETECTED Final   Candida tropicalis NOT DETECTED NOT DETECTED Final   Cryptococcus neoformans/gattii NOT DETECTED NOT DETECTED Final    Comment: Performed at [facility], [address] [place], [place] [address]  MRSA Next Gen by PCR, Nasal     Status: None   Collection Time: 06/18/24  7:07 PM   Specimen: Nasal Mucosa; Nasal Swab  Result Value Ref Range Status   MRSA by PCR Next Gen NOT DETECTED NOT DETECTED Final    Comment: (NOTE) The GeneXpert MRSA Assay (FDA approved for NASAL specimens only), is one component of a comprehensive MRSA colonization surveillance program. It is not intended to diagnose MRSA infection nor to guide or monitor treatment for MRSA infections. Test performance is not FDA approved in patients less than 15 years old. Performed at [facility], [address], [place], [place] [address]   Culture, blood (Routine X 2) w Reflex to ID Panel     Status: None   Collection Time: 06/20/24  3:42 PM   Specimen: BLOOD LEFT HAND  Result Value Ref Range Status   Specimen Description BLOOD LEFT HAND  Final   Special Requests   Final    BOTTLES DRAWN AEROBIC AND ANAEROBIC Blood Culture adequate volume   Culture   Final    NO GROWTH 5 DAYS Performed at [facility], [address] [place], [place] [address]    Report Status 06/25/2024 FINAL  Final  Culture, blood (Routine X 2) w Reflex to ID Panel     Status: None   Collection Time: 06/20/24  3:44  PM   Specimen: BLOOD RIGHT HAND  Result Value  Ref Range Status   Specimen Description BLOOD RIGHT HAND  Final   Special Requests   Final    BOTTLES DRAWN AEROBIC ONLY Blood Culture results may not be optimal due to an inadequate volume of blood received in culture bottles   Culture   Final    NO GROWTH 5 DAYS Performed at [facility], [address] [place], [place] [address]    Report Status 06/25/2024 FINAL  Final     Radiology Studies: No results found.   Scheduled Meds:  amiodarone  100 mg Oral Daily   apixaban  2.5 mg Oral BID   Chlorhexidine Gluconate Cloth  6 each Topical Daily   feeding supplement  237 mL Oral BID BM   melatonin  3 mg Oral QHS   midodrine  10 mg Oral Q8H   QUEtiapine  25 mg Oral QHS   senna-docusate  1 tablet Oral BID   Continuous Infusions:   LOS: 7 days    Time spent:    Sigurd Pac, MD [facility]   06/25/2024, 11:59 AM

## 2024-06-25 NOTE — Progress Notes (Signed)
 Daily Progress Note   Date: 06/25/2024   Patient Name: Briana Fitzgerald  DOB: 1932-06-11  MRN: [id number]  Age / Sex: 89 y.o., female  Attending Physician: Fairy Frames, MD Primary Care Physician: Micheal Wolm ORN, MD Admit Date: 06/18/2024 Length of Stay: 7 days  Reason for Follow-up: Establishing goals of care  Past Medical History:  Diagnosis Date   Arthritis    BACK PAIN 07/18/2008   Bowel obstruction (HCC)    CONSTIPATION 10/02/2010   GERD 10/02/2010   Headache    IBS (irritable bowel syndrome)    LAMINECTOMY, LUMBAR, HX OF 06/16/2008   OSTEOARTHRITIS 10/02/2010   Pneumonia    RESTLESS LEG SYNDROME 10/02/2010   Sacral fracture (HCC) 08/23/2022   SACROILIAC JOINT DYSFUNCTION 06/16/2008   Sleep apnea    STYE 10/02/2010   TRANSIENT ISCHEMIC ATTACKS, HX OF 10/02/2010    Subjective:   Subjective: Chart Reviewed. Updates received. Patient Assessed. Created space and opportunity for patient  and family to explore thoughts and feelings regarding current medical situation.  Today's Discussion: Today before meeting with the patient/family, I reviewed the chart notes including nursing note from yesterday, cardiology note from yesterday, internal medicine note from yesterday, nursing note from today. I also reviewed vital signs, nursing flowsheets, medication administrations record, labs, and imaging. Labs reviewed include BMP with improvement in creatinine to 1.19 from 1.27 yesterday in the setting of bump in creatinine over the past week.  CBC with continued normal white count at 8.1 today in the setting of infection/septic pneumonia with peak leukocytosis 18.66 days ago.  Today saw the patient at bedside, she was resting comfortably in the bed.  She was awake, alert.  She knows her name, that she is at [facility], and that she is in the hospital because of pneumonia.  However, she thinks it is 52.  Mental status seems to be improving with treatment of infection.   She denies significant pain, nausea, vomiting.  However, she was cold and I provided a warm blanket as well as adjusted her pillow for her to make her more comfortable.  I shared that I would come back tomorrow to check on her and update her son to her progress for ongoing goals of care.  Previous conversations indicate goal is for treating acute situation, hopeful for improvement to be able to discharge eventually back to assisted living facility, open to SNF/rehab for strengthening if deemed necessary and approved.  I provided emotional and general support through therapeutic listening, empathy, sharing of stories, and other techniques. I answered all questions and addressed all concerns to the best of my ability.  Review of Systems  Constitutional:  Positive for fatigue.       Denies pain in general  Respiratory:  Negative for shortness of breath.   Gastrointestinal:  Negative for abdominal pain, nausea and vomiting.    Objective:   Primary Diagnoses: Present on Admission:  (Resolved) Septic shock (HCC)  Chronic diastolic CHF (congestive heart failure) (HCC)  Aortic root dilatation   Vital Signs:  BP (!) 131/57   Pulse 73   Temp 98.7 F (37.1 C) (Oral)   Resp (!) 25   Ht 5' 3.5 (1.613 m)   Wt 49 kg   SpO2 96%   BMI 18.84 kg/m   Physical Exam Vitals and nursing note reviewed.  Constitutional:      General: She is not in acute distress.    Appearance: She is ill-appearing.  HENT:     Head:  Normocephalic and atraumatic.  Cardiovascular:     Rate and Rhythm: Normal rate.  Pulmonary:     Effort: Pulmonary effort is normal. No respiratory distress.     Breath sounds: No wheezing or rhonchi.  Abdominal:     General: Abdomen is flat. Bowel sounds are normal. There is no distension.     Palpations: Abdomen is soft.     Tenderness: There is no abdominal tenderness.  Skin:    General: Skin is warm and dry.  Neurological:     General: No focal deficit present.     Mental  Status: She is alert.     Comments: Oriented to person, place, situation; disoriented to time  Psychiatric:        Mood and Affect: Mood normal.        Behavior: Behavior normal.     Palliative Assessment/Data: 50%   Existing Vynca/ACP Documentation: Advance directive signed 06/05/2022  Assessment & Plan:   HPI/Patient Profile:  89 y.o. female  with past medical history of HFpEF, TIA, mild dementia who presented from assisted living with AMS and rash on her hand.  She was admitted on 06/18/2024 with acute hypoxemic respiratory failure, septic shock due to CAP from M. catarrhalis delirium, shingles on the hand, and others.   Palliative medicine was consulted for GOC conversations.  SUMMARY OF RECOMMENDATIONS   DNR-Limited Continue current scope of care Hopeful for continued improvement to eventually discharge back to ALF Open to SNF/rehab if necessary/approved Continue time for improvement/outcomes Ongoing goals of care as needed Palliative medicine will continue to follow  Symptom Management:  Per primary team Palliative medicine is available to assist as needed  Code Status: DNR - Limited (DNR/DNI)  Prognosis: Unable to determine  Discharge Planning: To Be Determined  Discussed with: Patient, medical team, nursing team  Thank you for allowing us  to participate in the care of Alaine H Riebel PMT will continue to support holistically.  Billing based on MDM: Moderate  Detailed review of medical records (labs, imaging, vital signs), medically appropriate exam, discussed with treatment team, counseling and education to patient, family, & staff, documenting clinical information, medication management, coordination of care  Camellia Kays, NP Palliative Medicine Team  Team Phone # [phone] (Nights/Weekends)  05/15/2021, 8:17 AM

## 2024-06-25 NOTE — Plan of Care (Signed)
  Problem: Clinical Measurements: Goal: Will remain free from infection Outcome: Progressing Goal: Respiratory complications will improve Outcome: Progressing   Problem: Nutrition: Goal: Adequate nutrition will be maintained Outcome: Progressing   Problem: Coping: Goal: Level of anxiety will decrease Outcome: Progressing   

## 2024-06-25 NOTE — Plan of Care (Signed)

## 2024-06-26 DIAGNOSIS — Z66 Do not resuscitate: Secondary | ICD-10-CM | POA: Diagnosis not present

## 2024-06-26 DIAGNOSIS — Z7189 Other specified counseling: Secondary | ICD-10-CM | POA: Diagnosis not present

## 2024-06-26 DIAGNOSIS — Z515 Encounter for palliative care: Secondary | ICD-10-CM | POA: Diagnosis not present

## 2024-06-26 DIAGNOSIS — Z789 Other specified health status: Secondary | ICD-10-CM

## 2024-06-26 DIAGNOSIS — A419 Sepsis, unspecified organism: Secondary | ICD-10-CM | POA: Diagnosis not present

## 2024-06-26 DIAGNOSIS — I4891 Unspecified atrial fibrillation: Secondary | ICD-10-CM | POA: Diagnosis not present

## 2024-06-26 DIAGNOSIS — E43 Unspecified severe protein-calorie malnutrition: Secondary | ICD-10-CM | POA: Insufficient documentation

## 2024-06-26 LAB — BASIC METABOLIC PANEL WITH GFR
Anion gap: 8 (ref 5–15)
BUN: 34 mg/dL — ABNORMAL HIGH (ref 8–23)
CO2: 27 mmol/L (ref 22–32)
Calcium: 9.7 mg/dL (ref 8.9–10.3)
Chloride: 101 mmol/L (ref 98–111)
Creatinine, Ser: 1.05 mg/dL — ABNORMAL HIGH (ref 0.44–1.00)
GFR, Estimated: 50 mL/min — ABNORMAL LOW (ref >60)
Glucose, Bld: 101 mg/dL — ABNORMAL HIGH (ref 70–99)
Potassium: 4.1 mmol/L (ref 3.5–5.1)
Sodium: 136 mmol/L (ref 135–145)

## 2024-06-26 MED ORDER — ALBUMIN HUMAN 25 % IV SOLN
25.0000 g | Freq: Four times a day (QID) | INTRAVENOUS | Status: AC
  Administered 2024-06-26 (×2): 25 g via INTRAVENOUS
  Filled 2024-06-26 (×2): qty 100

## 2024-06-26 MED ORDER — METOPROLOL TARTRATE 25 MG PO TABS
25.0000 mg | ORAL_TABLET | Freq: Two times a day (BID) | ORAL | Status: DC
  Administered 2024-06-26 – 2024-06-27 (×3): 25 mg via ORAL
  Filled 2024-06-26 (×3): qty 1

## 2024-06-26 MED ORDER — FUROSEMIDE 10 MG/ML IJ SOLN
40.0000 mg | Freq: Two times a day (BID) | INTRAMUSCULAR | Status: DC
Start: 2024-06-26 — End: 2024-06-28
  Administered 2024-06-26 – 2024-06-27 (×4): 40 mg via INTRAVENOUS
  Filled 2024-06-26 (×4): qty 4

## 2024-06-26 NOTE — Progress Notes (Addendum)
 PROGRESS NOTE    Briana Fitzgerald  FMW:[id number] DOB: November 20, 1931 DOA: 06/18/2024 PCP: Micheal Wolm ORN, MD   89 y.o. female with PMH significant for mild dementia, diastolic CHF, TIA. Lives at [facility] assisted living facility, hard of hearing, alert and oriented at baseline. 10/3, patient was brought to the ED with complaint of a rash on her hand and altered mental status.  In the ED she was febrile with septic shock, complicated by A-fib RVR and hypotension Chest x-ray showed bibasilar opacities, left upper lobe opacity. -Treated with pressor support in ICU, Amio gtt. Blood cultures grew Moraxella catarrhalis  While in ICU, patient was delirious and required Precedex drip for about 48 hours.  Was able to come off Levophed in about 72 hours -Transferred from PCCM to TRH service - 10/10, significantly volume overloaded with third spacing, albumin is 1.6, started on IV Lasix  - Overall prognosis is poor, palliative care following  Subjective: - Remains confused, no overnight events  Assessment and Plan:  Septic shock POA Multifocal pneumonia Moraxella catarrhalis bacteremia Patient presented in septic shock due to pneumonia requiring patient to ICU on pressors. Chest x-ray showed multifocal pneumonia Blood cultures in the admission grew Moraxella catarrhalis Completed 7 days of IV ceftriaxone, repeat blood cultures are negative -Continue midodrine - SLP following for dysphagia  A-fib with RVR New onset A-fib Developed RVR in the setting of septic shock Cards following, started on IV amiodarone, now switched to p.o. Started on anticoagulation with Eliquis 2.5 mg twice daily  Acute on chronic diastolic CHF Bilateral lower extremity edema Patient has bilateral lower EXTR edema and low albumin of 1.6. PTA meds- torsemide  which is currently on hold - She has fair bit of flank, thigh and lower leg edema from third spacing - Continue IV Lasix  today, add albumin X2  Severe  hypoalbuminemia and malnutrition -Prognosis is poor, palliative care following   Left forearm laceration -Clinically do not suspect shingles   Acute metabolic encephalopathy Underlying mild dementia While in ICU, patient was delirious and required Precedex drip for about 48 hours. Currently on Seroquel 25 mg nightly, melatonin 3 mg nightly   Dysphagia Seen by ST. High risk of aspiration Currently on dysphagia 1 diet   AKI on CKD 2 Baseline creatinine normal. Creatinine elevated in the setting of septic shock Now improving  Recurrent urinary retention - Foley catheter placed on 10/7, now on bethanechol - Attempt voiding trial when she is more ambulatory  Hypoalbuminemia Albumin low at 1.6.  Poor nutrition, starting to have anasarca consult dietitian   Restless legs Was on pramipexole  0.5 mg nightly.  Currently on hold   DVT prophylaxis: Apixaban Code Status: DNR Family Communication: No family at bedside Disposition Plan: SNF with palliative care if she stabilizes   Antimicrobials:    Objective: Vitals:   06/25/24 1928 06/26/24 0409 06/26/24 0500 06/26/24 1005  BP: (!) 130/49 (!) 115/44  (!) 117/56  Pulse: 65 62  66  Resp: 20 20  18   Temp: 97.8 F (36.6 C) 98.1 F (36.7 C)  98.4 F (36.9 C)  TempSrc:    Oral  SpO2: 97% 97%  97%  Weight:   49.3 kg   Height:        Intake/Output Summary (Last 24 hours) at 06/26/2024 1139 Last data filed at 06/26/2024 1000 Gross per 24 hour  Intake 118 ml  Output 3300 ml  Net -3182 ml   Filed Weights   06/21/24 0500 06/22/24 0500 06/26/24 0500  Weight:  49 kg 49 kg 49.3 kg    Examination:  General exam: Chronic ill frail cachectic, awake alert, oriented to self only Respiratory system: Decreased breath sounds at the bases Cardiovascular system: S1 & S2 heard, RRR.  Abd: nondistended, soft and nontender.Normal bowel sounds heard. Extremities: Edema in flanks, 2+ lower extremities Skin: No rashes Psychiatry: Flat  affect    Data Reviewed:   CBC: Recent Labs  Lab 06/20/24 0321 06/21/24 0337 06/24/24 0417 06/25/24 0712  WBC 13.3* 11.2* 8.9 8.1  NEUTROABS  --   --  6.5 5.9  HGB 13.5 12.5 10.6* 10.4*  HCT 41.9 38.0 31.7* 31.5*  MCV 85.3 82.3 81.3 82.5  PLT 158 178 193 226   Basic Metabolic Panel: Recent Labs  Lab 06/20/24 0321 06/21/24 0337 06/24/24 0417 06/25/24 0712 06/26/24 0658  NA 134* 133* 134* 136 136  K 3.8 3.6 3.6 4.0 4.1  CL 98 99 100 100 101  CO2 22 23 25 25 27   GLUCOSE 127* 123* 119* 106* 101*  BUN 31* 29* 40* 39* 34*  CREATININE 1.20* 0.99 1.27* 1.19* 1.05*  CALCIUM  9.2 9.3 9.5 9.5 9.7  MG 1.9 1.7  --   --   --   PHOS 3.0 3.4  --   --   --    GFR: Estimated Creatinine Clearance: 27.7 mL/min (A) (by C-G formula based on SCr of 1.05 mg/dL (H)). Liver Function Tests: Recent Labs  Lab 06/21/24 0337 06/24/24 0417  AST  --  15  ALT  --  12  ALKPHOS  --  60  BILITOT  --  0.7  PROT  --  4.0*  ALBUMIN 1.7* 1.6*   No results for input(s): LIPASE, AMYLASE in the last 168 hours. No results for input(s): AMMONIA in the last 168 hours. Coagulation Profile: No results for input(s): INR, PROTIME in the last 168 hours. Cardiac Enzymes: No results for input(s): CKTOTAL, CKMB, CKMBINDEX, TROPONINI in the last 168 hours. BNP (last 3 results) No results for input(s): PROBNP in the last 8760 hours. HbA1C: No results for input(s): HGBA1C in the last 72 hours. CBG: Recent Labs  Lab 06/24/24 2003 06/24/24 2333 06/25/24 0332  GLUCAP 136* 136* 110*   Lipid Profile: No results for input(s): CHOL, HDL, LDLCALC, TRIG, CHOLHDL, LDLDIRECT in the last 72 hours. Thyroid  Function Tests: No results for input(s): TSH, T4TOTAL, FREET4, T3FREE, THYROIDAB in the last 72 hours. Anemia Panel: No results for input(s): VITAMINB12, FOLATE, FERRITIN, TIBC, IRON, RETICCTPCT in the last 72 hours. Urine analysis:    Component Value  Date/Time   COLORURINE YELLOW 06/18/2024 1702   APPEARANCEUR HAZY (A) 06/18/2024 1702   LABSPEC 1.010 06/18/2024 1702   PHURINE 5.0 06/18/2024 1702   GLUCOSEU NEGATIVE 06/18/2024 1702   HGBUR NEGATIVE 06/18/2024 1702   BILIRUBINUR NEGATIVE 06/18/2024 1702   BILIRUBINUR Negative 03/28/2023 1607   KETONESUR NEGATIVE 06/18/2024 1702   PROTEINUR NEGATIVE 06/18/2024 1702   UROBILINOGEN 0.2 03/28/2023 1607   NITRITE NEGATIVE 06/18/2024 1702   LEUKOCYTESUR SMALL (A) 06/18/2024 1702   Sepsis Labs: @LABRCNTIP (procalcitonin:4,lacticidven:4)  ) Recent Results (from the past 240 hours)  Blood culture (routine x 2)     Status: Abnormal   Collection Time: 06/18/24  5:00 PM   Specimen: BLOOD  Result Value Ref Range Status   Specimen Description BLOOD RIGHT ANTECUBITAL  Final   Special Requests   Final    BOTTLES DRAWN AEROBIC AND ANAEROBIC Blood Culture results may not be optimal due to an inadequate volume  of blood received in culture bottles   Culture  Setup Time   Final    GRAM NEGATIVE COCCI IN BOTH AEROBIC AND ANAEROBIC BOTTLES CRITICAL VALUE NOTED.  VALUE IS CONSISTENT WITH PREVIOUSLY REPORTED AND CALLED VALUE. CORRECTED RESULTS PREVIOUSLY REPORTED AS: GRAM VARIABLE ROD CRITICAL RESULT CALLED TO, READ BACK BY AND VERIFIED WITH: PHARMD B.WANNARAT AT 9061 ON 06/21/2024 BY T.SAAD.    Culture (A)  Final    MORAXELLA CATARRHALIS(BRANHAMELLA) BETA LACTAMASE POSITIVE    Report Status 06/21/2024 FINAL  Final  Resp panel by RT-PCR (RSV, Flu A&B, Covid) Urine, Clean Catch     Status: None   Collection Time: 06/18/24  5:02 PM   Specimen: Urine, Clean Catch; Nasal Swab  Result Value Ref Range Status   SARS Coronavirus 2 by RT PCR NEGATIVE NEGATIVE Final   Influenza A by PCR NEGATIVE NEGATIVE Final   Influenza B by PCR NEGATIVE NEGATIVE Final    Comment: (NOTE) The Xpert Xpress SARS-CoV-2/FLU/RSV plus assay is intended as an aid in the diagnosis of influenza from Nasopharyngeal swab  specimens and should not be used as a sole basis for treatment. Nasal washings and aspirates are unacceptable for Xpert Xpress SARS-CoV-2/FLU/RSV testing.  Fact Sheet for Patients: BloggerCourse.com  Fact Sheet for Healthcare Providers: SeriousBroker.it  This test is not yet approved or cleared by the [place]  FDA and has been authorized for detection and/or diagnosis of SARS-CoV-2 by FDA under an Emergency Use Authorization (EUA). This EUA will remain in effect (meaning this test can be used) for the duration of the COVID-19 declaration under Section 564(b)(1) of the Act, 21 U.S.C. section 360bbb-3(b)(1), unless the authorization is terminated or revoked.     Resp Syncytial Virus by PCR NEGATIVE NEGATIVE Final    Comment: (NOTE) Fact Sheet for Patients: BloggerCourse.com  Fact Sheet for Healthcare Providers: SeriousBroker.it  This test is not yet approved or cleared by the [place]  FDA and has been authorized for detection and/or diagnosis of SARS-CoV-2 by FDA under an Emergency Use Authorization (EUA). This EUA will remain in effect (meaning this test can be used) for the duration of the COVID-19 declaration under Section 564(b)(1) of the Act, 21 U.S.C. section 360bbb-3(b)(1), unless the authorization is terminated or revoked.  Performed at [facility], [address] [place], [place] [address]   Blood culture (routine x 2)     Status: Abnormal   Collection Time: 06/18/24  5:18 PM   Specimen: BLOOD RIGHT ARM  Result Value Ref Range Status   Specimen Description BLOOD RIGHT ARM  Final   Special Requests   Final    BOTTLES DRAWN AEROBIC AND ANAEROBIC Blood Culture results may not be optimal due to an inadequate volume of blood received in culture bottles   Culture  Setup Time   Final    GRAM NEGATIVE COCCI IN BOTH AEROBIC AND ANAEROBIC BOTTLES CRITICAL  RESULT CALLED TO, READ BACK BY AND VERIFIED WITH: PHARMD J.MILLEN AT 1157 ON 06/19/2024 BY T.SAAD. PREVIOUSLY REPORTED AS: GRAM VARIABLE ROD CORRECTED RESULTS CALLED TO: PHARMD B.WANNARAT AT 9061 ON 06/21/2024 BY T.SAAD.    Culture (A)  Final    MORAXELLA CATARRHALIS(BRANHAMELLA) BETA LACTAMASE POSITIVE Performed at [facility], [address] [place], [place] [address]    Report Status 06/21/2024 FINAL  Final  Blood Culture ID Panel (Reflexed)     Status: None   Collection Time: 06/18/24  5:18 PM  Result Value Ref Range Status   Enterococcus faecalis NOT  DETECTED NOT DETECTED Final   Enterococcus Faecium NOT DETECTED NOT DETECTED Final   Listeria monocytogenes NOT DETECTED NOT DETECTED Final   Staphylococcus species NOT DETECTED NOT DETECTED Final   Staphylococcus aureus (BCID) NOT DETECTED NOT DETECTED Final   Staphylococcus epidermidis NOT DETECTED NOT DETECTED Final   Staphylococcus lugdunensis NOT DETECTED NOT DETECTED Final   Streptococcus species NOT DETECTED NOT DETECTED Final   Streptococcus agalactiae NOT DETECTED NOT DETECTED Final   Streptococcus pneumoniae NOT DETECTED NOT DETECTED Final   Streptococcus pyogenes NOT DETECTED NOT DETECTED Final   A.calcoaceticus-baumannii NOT DETECTED NOT DETECTED Final   Bacteroides fragilis NOT DETECTED NOT DETECTED Final   Enterobacterales NOT DETECTED NOT DETECTED Final   Enterobacter cloacae complex NOT DETECTED NOT DETECTED Final   Escherichia coli NOT DETECTED NOT DETECTED Final   Klebsiella aerogenes NOT DETECTED NOT DETECTED Final   Klebsiella oxytoca NOT DETECTED NOT DETECTED Final   Klebsiella pneumoniae NOT DETECTED NOT DETECTED Final   Proteus species NOT DETECTED NOT DETECTED Final   Salmonella species NOT DETECTED NOT DETECTED Final   Serratia marcescens NOT DETECTED NOT DETECTED Final   Haemophilus influenzae NOT DETECTED NOT DETECTED Final   Neisseria meningitidis NOT DETECTED NOT DETECTED Final   Pseudomonas  aeruginosa NOT DETECTED NOT DETECTED Final   Stenotrophomonas maltophilia NOT DETECTED NOT DETECTED Final   Candida albicans NOT DETECTED NOT DETECTED Final   Candida auris NOT DETECTED NOT DETECTED Final   Candida glabrata NOT DETECTED NOT DETECTED Final   Candida krusei NOT DETECTED NOT DETECTED Final   Candida parapsilosis NOT DETECTED NOT DETECTED Final   Candida tropicalis NOT DETECTED NOT DETECTED Final   Cryptococcus neoformans/gattii NOT DETECTED NOT DETECTED Final    Comment: Performed at [facility], [address], [place], [place] [address]  MRSA Next Gen by PCR, Nasal     Status: None   Collection Time: 06/18/24  7:07 PM   Specimen: Nasal Mucosa; Nasal Swab  Result Value Ref Range Status   MRSA by PCR Next Gen NOT DETECTED NOT DETECTED Final    Comment: (NOTE) The GeneXpert MRSA Assay (FDA approved for NASAL specimens only), is one component of a comprehensive MRSA colonization surveillance program. It is not intended to diagnose MRSA infection nor to guide or monitor treatment for MRSA infections. Test performance is not FDA approved in patients less than 36 years old. Performed at [facility], [address] [place], [place] [address]   Culture, blood (Routine X 2) w Reflex to ID Panel     Status: None   Collection Time: 06/20/24  3:42 PM   Specimen: BLOOD LEFT HAND  Result Value Ref Range Status   Specimen Description BLOOD LEFT HAND  Final   Special Requests   Final    BOTTLES DRAWN AEROBIC AND ANAEROBIC Blood Culture adequate volume   Culture   Final    NO GROWTH 5 DAYS Performed at [facility], [address], [place], [place] [address]    Report Status 06/25/2024 FINAL  Final  Culture, blood (Routine X 2) w Reflex to ID Panel     Status: None   Collection Time: 06/20/24  3:44 PM   Specimen: BLOOD RIGHT HAND  Result Value Ref Range Status   Specimen Description BLOOD RIGHT HAND  Final   Special Requests   Final    BOTTLES DRAWN  AEROBIC ONLY Blood Culture results may not be optimal due to an inadequate volume of blood  received in culture bottles   Culture   Final    NO GROWTH 5 DAYS Performed at [facility], [address] [place], [place] [address]    Report Status 06/25/2024 FINAL  Final     Radiology Studies: No results found.   Scheduled Meds:  amiodarone  100 mg Oral Daily   apixaban  2.5 mg Oral BID   Chlorhexidine Gluconate Cloth  6 each Topical Daily   feeding supplement  237 mL Oral BID BM   melatonin  3 mg Oral QHS   midodrine  10 mg Oral Q8H   QUEtiapine  25 mg Oral QHS   senna-docusate  1 tablet Oral BID   Continuous Infusions:   LOS: 8 days    Time spent:    Sigurd Pac, MD [facility]   06/26/2024, 11:39 AM

## 2024-06-26 NOTE — Plan of Care (Signed)
  Problem: Education: Goal: Knowledge of General Education information will improve Description: Including pain rating scale, medication(s)/side effects and non-pharmacologic comfort measures Outcome: Progressing   Problem: Education: Goal: Knowledge of General Education information will improve Description: Including pain rating scale, medication(s)/side effects and non-pharmacologic comfort measures Outcome: Progressing   Problem: Health Behavior/Discharge Planning: Goal: Ability to manage health-related needs will improve Outcome: Progressing   Problem: Clinical Measurements: Goal: Ability to maintain clinical measurements within normal limits will improve Outcome: Progressing Goal: Will remain free from infection Outcome: Progressing Goal: Diagnostic test results will improve Outcome: Progressing   Problem: Safety: Goal: Ability to remain free from injury will improve Outcome: Progressing

## 2024-06-26 NOTE — Progress Notes (Signed)
 Daily Progress Note   Date: 06/26/2024   Patient Name: Briana Fitzgerald  DOB: [birth year]  MRN: [id number]  Age / Sex: 89 y.o., female  Attending Physician: Fairy Frames, MD Primary Care Physician: Micheal Wolm ORN, MD Admit Date: 06/18/2024 Length of Stay: 8 days  Reason for Follow-up: Establishing goals of care  Past Medical History:  Diagnosis Date   Arthritis    BACK PAIN 07/18/2008   Bowel obstruction (HCC)    CONSTIPATION 10/02/2010   GERD 10/02/2010   Headache    IBS (irritable bowel syndrome)    LAMINECTOMY, LUMBAR, HX OF 06/16/2008   OSTEOARTHRITIS 10/02/2010   Pneumonia    RESTLESS LEG SYNDROME 10/02/2010   Sacral fracture (HCC) 08/23/2022   SACROILIAC JOINT DYSFUNCTION 06/16/2008   Sleep apnea    STYE 10/02/2010   TRANSIENT ISCHEMIC ATTACKS, HX OF 10/02/2010    Subjective:   Subjective: Chart Reviewed. Updates received. Patient Assessed. Created space and opportunity for patient  and family to explore thoughts and feelings regarding current medical situation.  Today's Discussion: Today before meeting with the patient/family, I reviewed the chart notes including PT note from yesterday, internal medicine note from yesterday, dietitian note from yesterday, nurse note from yesterday.  Later in the day I reviewed the internal medicine note from today.. I also reviewed vital signs, nursing flowsheets, medication administrations record, labs, and imaging. Labs reviewed include BMP with improvement in creatinine to 1.05 from 1.19 yesterday in the setting of bump in creatinine over the past week in the setting of sepsis and septic shock.  Today saw the patient at bedside, she was resting comfortably in the bed.  She was sleeping peacefully and elected to not wake her as she did not awaken to voice.  She appears comfortable, respirations even and unlabored, no grimacing or signs of discomfort.  After seeing the patient I reached out to her son Zachary.  We discussed the  improvement in her clinical situation including improving mental status (yesterday was oriented to person, place, understood she is in the hospital for pneumonia; however thinks it was 1995).  We also discussed redraw of blood cultures which shows no growth which is improved from positive cultures on admission.  We again discussed the plan and hopeful for discharge from the hospital after acute processes are treated, open to SNF/rehab for strengthening if it is available, in the long run return to assisted living facility for ongoing care and quality of life.  I shared that because goals are clear the palliative medicine team would back off.  However we remain available for any significant clinical change or new palliative needs in the interim.  I provided emotional and general support through therapeutic listening, empathy, sharing of stories, and other techniques. I answered all questions and addressed all concerns to the best of my ability.  Review of Systems  Unable to perform ROS   Objective:   Primary Diagnoses: Present on Admission:  (Resolved) Septic shock (HCC)  Chronic diastolic CHF (congestive heart failure) (HCC)  Aortic root dilatation   Vital Signs:  BP (!) 117/56   Pulse (!) 110   Temp 98.4 F (36.9 C) (Oral)   Resp 18   Ht 5' 3.5 (1.613 m)   Wt 49.3 kg   SpO2 100%   BMI 18.95 kg/m   Physical Exam Vitals and nursing note reviewed.  Constitutional:      General: She is not in acute distress.    Appearance: She is ill-appearing.  HENT:  Head: Normocephalic and atraumatic.  Cardiovascular:     Rate and Rhythm: Normal rate.  Pulmonary:     Effort: Pulmonary effort is normal.  Abdominal:     General: Abdomen is flat.  Skin:    General: Skin is warm and dry.  Neurological:     Mental Status: She is alert.     Comments: Oriented to person, place, situation; disoriented to time     Palliative Assessment/Data: 50%   Existing Vynca/ACP  Documentation: Advance directive signed 06/05/2022  Assessment & Plan:   HPI/Patient Profile:  89 y.o. female  with past medical history of HFpEF, TIA, mild dementia who presented from assisted living with AMS and rash on her hand.  She was admitted on 06/18/2024 with acute hypoxemic respiratory failure, septic shock due to CAP from M. catarrhalis delirium, shingles on the hand, and others.   Palliative medicine was consulted for GOC conversations.  SUMMARY OF RECOMMENDATIONS   DNR-Limited Continue current scope of care Hopeful for continued improvement to eventually discharge back to ALF Open to SNF/rehab if necessary/approved Goals are clear Palliative medicine will back off at this time Please notify us  of any significant clinical change or new palliative needs  Symptom Management:  Per primary team Palliative medicine is available to assist as needed  Code Status: DNR - Limited (DNR/DNI)  Prognosis: Unable to determine  Discharge Planning: SNF/Rehab  Discussed with: Patient's family, medical team, nursing team  Thank you for allowing us  to participate in the care of Willisha H Relph PMT will continue to support holistically.  Billing based on MDM: Moderate  Detailed review of medical records (labs, imaging, vital signs), medically appropriate exam, discussed with treatment team, counseling and education to patient, family, & staff, documenting clinical information, medication management, coordination of care  Camellia Kays, NP Palliative Medicine Team  Team Phone # [phone] (Nights/Weekends)  05/15/2021, 8:17 AM

## 2024-06-27 DIAGNOSIS — A419 Sepsis, unspecified organism: Secondary | ICD-10-CM | POA: Diagnosis not present

## 2024-06-27 DIAGNOSIS — Z789 Other specified health status: Secondary | ICD-10-CM | POA: Diagnosis not present

## 2024-06-27 DIAGNOSIS — Z7189 Other specified counseling: Secondary | ICD-10-CM | POA: Diagnosis not present

## 2024-06-27 DIAGNOSIS — Z515 Encounter for palliative care: Secondary | ICD-10-CM | POA: Diagnosis not present

## 2024-06-27 LAB — BASIC METABOLIC PANEL WITH GFR
Anion gap: 8 (ref 5–15)
BUN: 39 mg/dL — ABNORMAL HIGH (ref 8–23)
CO2: 28 mmol/L (ref 22–32)
Calcium: 9.6 mg/dL (ref 8.9–10.3)
Chloride: 102 mmol/L (ref 98–111)
Creatinine, Ser: 1.18 mg/dL — ABNORMAL HIGH (ref 0.44–1.00)
GFR, Estimated: 44 mL/min — ABNORMAL LOW (ref >60)
Glucose, Bld: 99 mg/dL (ref 70–99)
Potassium: 3.6 mmol/L (ref 3.5–5.1)
Sodium: 138 mmol/L (ref 135–145)

## 2024-06-27 LAB — CBC
HCT: 28.5 % — ABNORMAL LOW (ref 36.0–46.0)
Hemoglobin: 9.2 g/dL — ABNORMAL LOW (ref 12.0–15.0)
MCH: 27.3 pg (ref 26.0–34.0)
MCHC: 32.3 g/dL (ref 30.0–36.0)
MCV: 84.6 fL (ref 80.0–100.0)
Platelets: 206 K/uL (ref 150–400)
RBC: 3.37 MIL/uL — ABNORMAL LOW (ref 3.87–5.11)
RDW: 15.1 % (ref 11.5–15.5)
WBC: 7.1 K/uL (ref 4.0–10.5)
nRBC: 0 % (ref 0.0–0.2)

## 2024-06-27 MED ORDER — ALBUMIN HUMAN 25 % IV SOLN
25.0000 g | Freq: Four times a day (QID) | INTRAVENOUS | Status: AC
  Administered 2024-06-27 (×2): 25 g via INTRAVENOUS
  Filled 2024-06-27 (×2): qty 100

## 2024-06-27 NOTE — TOC Progression Note (Signed)
 Transition of Care [facility]) - Progression Note    Patient Details  Name: Briana Fitzgerald MRN: [id number] Date of Birth: [birth year]  [facility] CM/SW Contact  Lela Gell LITTIE [place], [place] Phone Number: 06/27/2024, 8:55 AM  Clinical Narrative:    CSW spoke with pt son, Zachary, over the phone to complete SNF workup. Zachary is in agreement with DC plan and stated pt has never been to a SNF before. Zachary said pt had been at [facility] for about a week before being in the hospital and would like for pt to go to SNF before returning to ALF. CSW sent out SNF referrals and will follow up with Zachary regarding bed offers. CSW will continue to follow.   Expected Discharge Plan: Skilled Nursing Facility Barriers to Discharge: Continued Medical Work up, English as a second language teacher, SNF Pending bed offer               Expected Discharge Plan and Services In-house Referral: Clinical Social Work     Living arrangements for the past 2 months: Assisted Living Facility                                       Social Drivers of Health (SDOH) Interventions SDOH Screenings   Food Insecurity: No Food Insecurity (06/23/2024)  Housing: Low Risk  (06/23/2024)  Transportation Needs: No Transportation Needs (06/23/2024)  Utilities: Not At Risk (06/23/2024)  Alcohol Screen: Low Risk  (05/27/2024)  Depression (PHQ2-9): Low Risk  (10/01/2023)  Financial Resource Strain: Low Risk  (05/27/2024)  Physical Activity: Inactive (05/27/2024)  Social Connections: Unknown (06/23/2024)  Recent Concern: Social Connections - Socially Isolated (05/27/2024)  Stress: No Stress Concern Present (05/27/2024)  Tobacco Use: Low Risk  (06/18/2024)    Readmission Risk Interventions     No data to display

## 2024-06-27 NOTE — Plan of Care (Signed)

## 2024-06-27 NOTE — Progress Notes (Signed)
 PROGRESS NOTE    Briana Fitzgerald  FMW:[id number] DOB: November 29, 1931 DOA: 06/18/2024 PCP: Micheal Wolm ORN, MD   89 y.o. female with PMH significant for mild dementia, diastolic CHF, TIA. Lives at [facility] assisted living facility, hard of hearing, alert and oriented at baseline. 10/3, patient was brought to the ED with complaint of a rash on her hand and altered mental status.  In the ED she was febrile with septic shock, complicated by A-fib RVR and hypotension Chest x-ray showed bibasilar opacities, left upper lobe opacity. -Treated with pressor support in ICU, Amio gtt. Blood cultures grew Moraxella catarrhalis  While in ICU, patient was delirious and required Precedex drip for about 48 hours.  Was able to come off Levophed in about 72 hours -Transferred from PCCM to TRH service - 10/10, significantly volume overloaded with third spacing, albumin is 1.6, started on IV Lasix  - Overall prognosis is poor, palliative care following  Subjective: - Remains pleasantly confused, low-grade temp overnight  Assessment and Plan:  Septic shock POA Multifocal pneumonia Moraxella catarrhalis bacteremia Patient presented in septic shock due to pneumonia requiring patient to ICU on pressors. Chest x-ray showed multifocal pneumonia Blood cultures in the admission grew Moraxella catarrhalis Completed 7 days of IV ceftriaxone, repeat blood cultures are negative -Continue midodrine - SLP following for dysphagia  Goals: overall prognosis is poor, with cachexia severe hypoalbuminemia, CHF, A-fib, mild dementia and worsening confusion - Called and discussed with son, would be appropriate for hospice at discharge  A-fib with RVR New onset A-fib Developed RVR in the setting of septic shock Cards following, started on IV amiodarone, now switched to p.o. Started on anticoagulation with Eliquis 2.5 mg twice daily  Acute on chronic diastolic CHF Bilateral lower extremity edema Patient has  bilateral lower EXTR edema and low albumin of 1.6. PTA meds- torsemide  which is currently on hold - She has fair bit of flank, thigh and lower leg edema from third spacing - Continue IV Lasix  today,, repeat albumin  Severe hypoalbuminemia and malnutrition -Prognosis is poor, palliative care following   Left forearm laceration -Clinically do not suspect shingles   Acute metabolic encephalopathy Underlying mild dementia While in ICU, patient was delirious and required Precedex drip for about 48 hours. Currently on Seroquel 25 mg nightly, melatonin 3 mg nightly   Dysphagia Seen by ST. High risk of aspiration Currently on dysphagia 1 diet   AKI on CKD 2 Baseline creatinine normal. Creatinine elevated in the setting of septic shock Now improving  Recurrent urinary retention - Foley catheter placed on 10/7, now on bethanechol - Attempt voiding trial when she is more ambulatory  Hypoalbuminemia Albumin low at 1.6.  Poor nutrition, starting to have anasarca consult dietitian   Restless legs Was on pramipexole  0.5 mg nightly.  Currently on hold   DVT prophylaxis: Apixaban Code Status: DNR Family Communication: No family at bedside, contacted son Disposition Plan: SNF with palliative care if she stabilizes   Antimicrobials:    Objective: Vitals:   06/26/24 2145 06/27/24 0500 06/27/24 0911 06/27/24 1107  BP: (!) 103/48  (!) 99/44 (!) 107/50  Pulse: 100  (!) 59 61  Resp: (!) 21  18   Temp: 98.5 F (36.9 C)  97.9 F (36.6 C)   TempSrc:      SpO2: 97%  98%   Weight:  49.5 kg    Height:        Intake/Output Summary (Last 24 hours) at 06/27/2024 1142 Last data filed at 06/27/2024  9190 Gross per 24 hour  Intake 551.37 ml  Output 1050 ml  Net -498.63 ml   Filed Weights   06/22/24 0500 06/26/24 0500 06/27/24 0500  Weight: 49 kg 49.3 kg 49.5 kg    Examination:  General exam: Chronic ill frail cachectic, awake alert, oriented to self only Respiratory system:  Decreased breath sounds at the bases Cardiovascular system: S1 & S2 heard, RRR.  Abd: nondistended, soft and nontender.Normal bowel sounds heard. Extremities: Edema in flanks, 2+ lower extremities Skin: No rashes Psychiatry: Flat affect    Data Reviewed:   CBC: Recent Labs  Lab 06/21/24 0337 06/24/24 0417 06/25/24 0712 06/27/24 0524  WBC 11.2* 8.9 8.1 7.1  NEUTROABS  --  6.5 5.9  --   HGB 12.5 10.6* 10.4* 9.2*  HCT 38.0 31.7* 31.5* 28.5*  MCV 82.3 81.3 82.5 84.6  PLT 178 193 226 206   Basic Metabolic Panel: Recent Labs  Lab 06/21/24 0337 06/24/24 0417 06/25/24 0712 06/26/24 0658 06/27/24 0524  NA 133* 134* 136 136 138  K 3.6 3.6 4.0 4.1 3.6  CL 99 100 100 101 102  CO2 23 25 25 27 28   GLUCOSE 123* 119* 106* 101* 99  BUN 29* 40* 39* 34* 39*  CREATININE 0.99 1.27* 1.19* 1.05* 1.18*  CALCIUM  9.3 9.5 9.5 9.7 9.6  MG 1.7  --   --   --   --   PHOS 3.4  --   --   --   --    GFR: Estimated Creatinine Clearance: 24.8 mL/min (A) (by C-G formula based on SCr of 1.18 mg/dL (H)). Liver Function Tests: Recent Labs  Lab 06/21/24 0337 06/24/24 0417  AST  --  15  ALT  --  12  ALKPHOS  --  60  BILITOT  --  0.7  PROT  --  4.0*  ALBUMIN 1.7* 1.6*   No results for input(s): LIPASE, AMYLASE in the last 168 hours. No results for input(s): AMMONIA in the last 168 hours. Coagulation Profile: No results for input(s): INR, PROTIME in the last 168 hours. Cardiac Enzymes: No results for input(s): CKTOTAL, CKMB, CKMBINDEX, TROPONINI in the last 168 hours. BNP (last 3 results) No results for input(s): PROBNP in the last 8760 hours. HbA1C: No results for input(s): HGBA1C in the last 72 hours. CBG: Recent Labs  Lab 06/24/24 2003 06/24/24 2333 06/25/24 0332  GLUCAP 136* 136* 110*   Lipid Profile: No results for input(s): CHOL, HDL, LDLCALC, TRIG, CHOLHDL, LDLDIRECT in the last 72 hours. Thyroid  Function Tests: No results for input(s):  TSH, T4TOTAL, FREET4, T3FREE, THYROIDAB in the last 72 hours. Anemia Panel: No results for input(s): VITAMINB12, FOLATE, FERRITIN, TIBC, IRON, RETICCTPCT in the last 72 hours. Urine analysis:    Component Value Date/Time   COLORURINE YELLOW 06/18/2024 1702   APPEARANCEUR HAZY (A) 06/18/2024 1702   LABSPEC 1.010 06/18/2024 1702   PHURINE 5.0 06/18/2024 1702   GLUCOSEU NEGATIVE 06/18/2024 1702   HGBUR NEGATIVE 06/18/2024 1702   BILIRUBINUR NEGATIVE 06/18/2024 1702   BILIRUBINUR Negative 03/28/2023 1607   KETONESUR NEGATIVE 06/18/2024 1702   PROTEINUR NEGATIVE 06/18/2024 1702   UROBILINOGEN 0.2 03/28/2023 1607   NITRITE NEGATIVE 06/18/2024 1702   LEUKOCYTESUR SMALL (A) 06/18/2024 1702   Sepsis Labs: @LABRCNTIP (procalcitonin:4,lacticidven:4)  ) Recent Results (from the past 240 hours)  Blood culture (routine x 2)     Status: Abnormal   Collection Time: 06/18/24  5:00 PM   Specimen: BLOOD  Result Value Ref Range Status  Specimen Description BLOOD RIGHT ANTECUBITAL  Final   Special Requests   Final    BOTTLES DRAWN AEROBIC AND ANAEROBIC Blood Culture results may not be optimal due to an inadequate volume of blood received in culture bottles   Culture  Setup Time   Final    GRAM NEGATIVE COCCI IN BOTH AEROBIC AND ANAEROBIC BOTTLES CRITICAL VALUE NOTED.  VALUE IS CONSISTENT WITH PREVIOUSLY REPORTED AND CALLED VALUE. CORRECTED RESULTS PREVIOUSLY REPORTED AS: GRAM VARIABLE ROD CRITICAL RESULT CALLED TO, READ BACK BY AND VERIFIED WITH: PHARMD B.WANNARAT AT 9061 ON 06/21/2024 BY T.SAAD.    Culture (A)  Final    MORAXELLA CATARRHALIS(BRANHAMELLA) BETA LACTAMASE POSITIVE    Report Status 06/21/2024 FINAL  Final  Resp panel by RT-PCR (RSV, Flu A&B, Covid) Urine, Clean Catch     Status: None   Collection Time: 06/18/24  5:02 PM   Specimen: Urine, Clean Catch; Nasal Swab  Result Value Ref Range Status   SARS Coronavirus 2 by RT PCR NEGATIVE NEGATIVE Final    Influenza A by PCR NEGATIVE NEGATIVE Final   Influenza B by PCR NEGATIVE NEGATIVE Final    Comment: (NOTE) The Xpert Xpress SARS-CoV-2/FLU/RSV plus assay is intended as an aid in the diagnosis of influenza from Nasopharyngeal swab specimens and should not be used as a sole basis for treatment. Nasal washings and aspirates are unacceptable for Xpert Xpress SARS-CoV-2/FLU/RSV testing.  Fact Sheet for Patients: BloggerCourse.com  Fact Sheet for Healthcare Providers: SeriousBroker.it  This test is not yet approved or cleared by the [place]  FDA and has been authorized for detection and/or diagnosis of SARS-CoV-2 by FDA under an Emergency Use Authorization (EUA). This EUA will remain in effect (meaning this test can be used) for the duration of the COVID-19 declaration under Section 564(b)(1) of the Act, 21 U.S.C. section 360bbb-3(b)(1), unless the authorization is terminated or revoked.     Resp Syncytial Virus by PCR NEGATIVE NEGATIVE Final    Comment: (NOTE) Fact Sheet for Patients: BloggerCourse.com  Fact Sheet for Healthcare Providers: SeriousBroker.it  This test is not yet approved or cleared by the [place]  FDA and has been authorized for detection and/or diagnosis of SARS-CoV-2 by FDA under an Emergency Use Authorization (EUA). This EUA will remain in effect (meaning this test can be used) for the duration of the COVID-19 declaration under Section 564(b)(1) of the Act, 21 U.S.C. section 360bbb-3(b)(1), unless the authorization is terminated or revoked.  Performed at [facility], [address] [place], [place] [address]   Blood culture (routine x 2)     Status: Abnormal   Collection Time: 06/18/24  5:18 PM   Specimen: BLOOD RIGHT ARM  Result Value Ref Range Status   Specimen Description BLOOD RIGHT ARM  Final   Special Requests   Final    BOTTLES  DRAWN AEROBIC AND ANAEROBIC Blood Culture results may not be optimal due to an inadequate volume of blood received in culture bottles   Culture  Setup Time   Final    GRAM NEGATIVE COCCI IN BOTH AEROBIC AND ANAEROBIC BOTTLES CRITICAL RESULT CALLED TO, READ BACK BY AND VERIFIED WITH: PHARMD J.MILLEN AT 1157 ON 06/19/2024 BY T.SAAD. PREVIOUSLY REPORTED AS: GRAM VARIABLE ROD CORRECTED RESULTS CALLED TO: PHARMD B.WANNARAT AT 9061 ON 06/21/2024 BY T.SAAD.    Culture (A)  Final    MORAXELLA CATARRHALIS(BRANHAMELLA) BETA LACTAMASE POSITIVE Performed at [facility], [address] [place], [place] [address]    Report Status 06/21/2024  FINAL  Final  Blood Culture ID Panel (Reflexed)     Status: None   Collection Time: 06/18/24  5:18 PM  Result Value Ref Range Status   Enterococcus faecalis NOT DETECTED NOT DETECTED Final   Enterococcus Faecium NOT DETECTED NOT DETECTED Final   Listeria monocytogenes NOT DETECTED NOT DETECTED Final   Staphylococcus species NOT DETECTED NOT DETECTED Final   Staphylococcus aureus (BCID) NOT DETECTED NOT DETECTED Final   Staphylococcus epidermidis NOT DETECTED NOT DETECTED Final   Staphylococcus lugdunensis NOT DETECTED NOT DETECTED Final   Streptococcus species NOT DETECTED NOT DETECTED Final   Streptococcus agalactiae NOT DETECTED NOT DETECTED Final   Streptococcus pneumoniae NOT DETECTED NOT DETECTED Final   Streptococcus pyogenes NOT DETECTED NOT DETECTED Final   A.calcoaceticus-baumannii NOT DETECTED NOT DETECTED Final   Bacteroides fragilis NOT DETECTED NOT DETECTED Final   Enterobacterales NOT DETECTED NOT DETECTED Final   Enterobacter cloacae complex NOT DETECTED NOT DETECTED Final   Escherichia coli NOT DETECTED NOT DETECTED Final   Klebsiella aerogenes NOT DETECTED NOT DETECTED Final   Klebsiella oxytoca NOT DETECTED NOT DETECTED Final   Klebsiella pneumoniae NOT DETECTED NOT DETECTED Final   Proteus species NOT DETECTED NOT DETECTED Final    Salmonella species NOT DETECTED NOT DETECTED Final   Serratia marcescens NOT DETECTED NOT DETECTED Final   Haemophilus influenzae NOT DETECTED NOT DETECTED Final   Neisseria meningitidis NOT DETECTED NOT DETECTED Final   Pseudomonas aeruginosa NOT DETECTED NOT DETECTED Final   Stenotrophomonas maltophilia NOT DETECTED NOT DETECTED Final   Candida albicans NOT DETECTED NOT DETECTED Final   Candida auris NOT DETECTED NOT DETECTED Final   Candida glabrata NOT DETECTED NOT DETECTED Final   Candida krusei NOT DETECTED NOT DETECTED Final   Candida parapsilosis NOT DETECTED NOT DETECTED Final   Candida tropicalis NOT DETECTED NOT DETECTED Final   Cryptococcus neoformans/gattii NOT DETECTED NOT DETECTED Final    Comment: Performed at [facility], [address], [place], [place] [address]  MRSA Next Gen by PCR, Nasal     Status: None   Collection Time: 06/18/24  7:07 PM   Specimen: Nasal Mucosa; Nasal Swab  Result Value Ref Range Status   MRSA by PCR Next Gen NOT DETECTED NOT DETECTED Final    Comment: (NOTE) The GeneXpert MRSA Assay (FDA approved for NASAL specimens only), is one component of a comprehensive MRSA colonization surveillance program. It is not intended to diagnose MRSA infection nor to guide or monitor treatment for MRSA infections. Test performance is not FDA approved in patients less than 6 years old. Performed at [facility], [address], [place], [place] [address]   Culture, blood (Routine X 2) w Reflex to ID Panel     Status: None   Collection Time: 06/20/24  3:42 PM   Specimen: BLOOD LEFT HAND  Result Value Ref Range Status   Specimen Description BLOOD LEFT HAND  Final   Special Requests   Final    BOTTLES DRAWN AEROBIC AND ANAEROBIC Blood Culture adequate volume   Culture   Final    NO GROWTH 5 DAYS Performed at [facility], [address], [place], [place] [address]    Report Status 06/25/2024 FINAL  Final  Culture, blood (Routine X 2) w  Reflex to ID Panel     Status: None   Collection Time: 06/20/24  3:44 PM   Specimen: BLOOD RIGHT HAND  Result Value Ref Range Status   Specimen  Description BLOOD RIGHT HAND  Final   Special Requests   Final    BOTTLES DRAWN AEROBIC ONLY Blood Culture results may not be optimal due to an inadequate volume of blood received in culture bottles   Culture   Final    NO GROWTH 5 DAYS Performed at [facility], [address] [place], [place] [address]    Report Status 06/25/2024 FINAL  Final  Culture, blood (Routine X 2) w Reflex to ID Panel     Status: None (Preliminary result)   Collection Time: 06/26/24  8:17 PM   Specimen: BLOOD  Result Value Ref Range Status   Specimen Description BLOOD SITE NOT SPECIFIED  Final   Special Requests Immunocompromised  Final   Culture   Final    NO GROWTH < 12 HOURS Performed at [facility], [address], [place], [place] [address]    Report Status PENDING  Incomplete  Culture, blood (Routine X 2) w Reflex to ID Panel     Status: None (Preliminary result)   Collection Time: 06/26/24  8:20 PM   Specimen: BLOOD  Result Value Ref Range Status   Specimen Description BLOOD SITE NOT SPECIFIED  Final   Special Requests   Final    Immunocompromised BOTTLES DRAWN AEROBIC AND ANAEROBIC Blood Culture adequate volume   Culture   Final    NO GROWTH < 12 HOURS Performed at [facility], [address], [place], [place] [address]    Report Status PENDING  Incomplete     Radiology Studies: No results found.   Scheduled Meds:  amiodarone  100 mg Oral Daily   apixaban  2.5 mg Oral BID   Chlorhexidine Gluconate Cloth  6 each Topical Daily   feeding supplement  237 mL Oral BID BM   furosemide   40 mg Intravenous BID   melatonin  3 mg Oral QHS   metoprolol tartrate  25 mg Oral BID   midodrine  10 mg Oral Q8H   QUEtiapine  25 mg Oral QHS   senna-docusate  1 tablet Oral BID   Continuous Infusions:   LOS: 9 days    Time spent:     Sigurd Pac, MD [facility]   06/27/2024, 11:42 AM

## 2024-06-27 NOTE — Progress Notes (Signed)
 Dressing to bilateral lower extremities changed and dressing to RUE changed.

## 2024-06-28 ENCOUNTER — Inpatient Hospital Stay (HOSPITAL_COMMUNITY)

## 2024-06-28 DIAGNOSIS — Z515 Encounter for palliative care: Secondary | ICD-10-CM | POA: Diagnosis not present

## 2024-06-28 DIAGNOSIS — A419 Sepsis, unspecified organism: Secondary | ICD-10-CM | POA: Diagnosis not present

## 2024-06-28 DIAGNOSIS — Z789 Other specified health status: Secondary | ICD-10-CM | POA: Diagnosis not present

## 2024-06-28 DIAGNOSIS — Z7189 Other specified counseling: Secondary | ICD-10-CM | POA: Diagnosis not present

## 2024-06-28 LAB — CBC
HCT: 29.6 % — ABNORMAL LOW (ref 36.0–46.0)
Hemoglobin: 9.6 g/dL — ABNORMAL LOW (ref 12.0–15.0)
MCH: 27.1 pg (ref 26.0–34.0)
MCHC: 32.4 g/dL (ref 30.0–36.0)
MCV: 83.6 fL (ref 80.0–100.0)
Platelets: 235 K/uL (ref 150–400)
RBC: 3.54 MIL/uL — ABNORMAL LOW (ref 3.87–5.11)
RDW: 15.1 % (ref 11.5–15.5)
WBC: 9.4 K/uL (ref 4.0–10.5)
nRBC: 0 % (ref 0.0–0.2)

## 2024-06-28 LAB — GLUCOSE, CAPILLARY: Glucose-Capillary: 121 mg/dL — ABNORMAL HIGH (ref 70–99)

## 2024-06-28 LAB — BASIC METABOLIC PANEL WITH GFR
Anion gap: 8 (ref 5–15)
BUN: 39 mg/dL — ABNORMAL HIGH (ref 8–23)
CO2: 29 mmol/L (ref 22–32)
Calcium: 9.7 mg/dL (ref 8.9–10.3)
Chloride: 101 mmol/L (ref 98–111)
Creatinine, Ser: 1.02 mg/dL — ABNORMAL HIGH (ref 0.44–1.00)
GFR, Estimated: 52 mL/min — ABNORMAL LOW (ref >60)
Glucose, Bld: 100 mg/dL — ABNORMAL HIGH (ref 70–99)
Potassium: 3.7 mmol/L (ref 3.5–5.1)
Sodium: 138 mmol/L (ref 135–145)

## 2024-06-28 MED ORDER — FUROSEMIDE 10 MG/ML IJ SOLN
40.0000 mg | Freq: Three times a day (TID) | INTRAMUSCULAR | Status: DC
  Administered 2024-06-28 – 2024-06-29 (×3): 40 mg via INTRAVENOUS
  Filled 2024-06-28 (×3): qty 4

## 2024-06-28 MED ORDER — METOPROLOL SUCCINATE ER 25 MG PO TB24
25.0000 mg | ORAL_TABLET | Freq: Every day | ORAL | Status: DC
  Administered 2024-06-28 – 2024-07-04 (×7): 25 mg via ORAL
  Filled 2024-06-28 (×7): qty 1

## 2024-06-28 MED ORDER — MIDODRINE HCL 5 MG PO TABS
5.0000 mg | ORAL_TABLET | Freq: Two times a day (BID) | ORAL | Status: DC
  Administered 2024-06-28 – 2024-06-29 (×3): 5 mg via ORAL
  Filled 2024-06-28 (×3): qty 1

## 2024-06-28 NOTE — Care Management Important Message (Signed)
 Important Message  Patient Details  Name: Briana Fitzgerald MRN: [id number] Date of Birth: Jan 20, 1932   Important Message Given:  Yes - Medicare IM     Jon Cruel 06/28/2024, 4:54 PM

## 2024-06-28 NOTE — Progress Notes (Signed)
 PROGRESS NOTE    Briana Fitzgerald  FMW:[id number] DOB: 04/16/1932 DOA: 06/18/2024 PCP: Micheal Wolm ORN, MD   89 y.o. female with PMH significant for mild dementia, diastolic CHF, TIA. Lives at [facility] assisted living facility, hard of hearing, alert and oriented at baseline. 10/3, patient was brought to the ED with complaint of a rash on her hand and altered mental status.  In the ED she was febrile with septic shock, complicated by A-fib RVR and hypotension Chest x-ray showed bibasilar opacities, left upper lobe opacity. -Treated with pressor support in ICU, Amio gtt. Blood cultures grew Moraxella catarrhalis  While in ICU, patient was delirious and required Precedex drip for about 48 hours.  Was able to come off Levophed in about 72 hours -Transferred from PCCM to TRH service - 10/10, significantly volume overloaded with third spacing, albumin is 1.6, started on IV Lasix  - Overall prognosis is poor, palliative care following  Subjective: - Remains weak and frail, pleasantly confused, no further fevers  Assessment and Plan:  Septic shock POA Multifocal pneumonia Moraxella catarrhalis bacteremia Patient presented in septic shock due to pneumonia requiring patient to ICU on pressors. Chest x-ray showed multifocal pneumonia Blood cultures-grew Moraxella catarrhalis Completed 7 days of IV ceftriaxone, repeat blood cultures are negative -Continue midodrine - SLP following for dysphagia  Goals: overall prognosis is poor, with advanced age, cachexia severe hypoalbuminemia, CHF, A-fib, mild dementia and worsening confusion - Called and discussed with son, would be appropriate for hospice at discharge  A-fib with RVR New onset A-fib Developed RVR in the setting of septic shock Cards following, started on IV amiodarone, now switched to p.o. Started on anticoagulation with Eliquis 2.5 mg twice daily - Now in sinus bradycardia  Acute on chronic diastolic CHF Bilateral lower  extremity edema Patient has bilateral lower EXTR edema and low albumin of 1.6. PTA meds- torsemide  which is currently on hold - She has fair bit of flank, thigh and lower leg edema from third spacing - Continue IV Lasix  today, given albumin few days ago  Severe hypoalbuminemia and malnutrition -Prognosis is poor, palliative care following   Left forearm laceration -Clinically do not suspect shingles   Acute metabolic encephalopathy Underlying mild dementia While in ICU, patient was delirious and required Precedex drip for about 48 hours. Currently on Seroquel 25 mg nightly, melatonin 3 mg nightly   Dysphagia Seen by ST. High risk of aspiration Currently on dysphagia 1 diet   AKI on CKD 2 Baseline creatinine normal. Creatinine elevated in the setting of septic shock Now improving  Recurrent urinary retention - Foley catheter placed on 10/7, now on bethanechol - Attempt voiding trial when she is more ambulatory  Hypoalbuminemia Albumin low at 1.6.  Poor nutrition, starting to have anasarca consult dietitian   Restless legs Was on pramipexole  0.5 mg nightly.  Currently on hold   DVT prophylaxis: Apixaban Code Status: DNR Family Communication: No family at bedside, contacted son yesterday Disposition Plan: SNF with palliative care if she stabilizes versus hospice   Antimicrobials:    Objective: Vitals:   06/27/24 1107 06/27/24 1808 06/27/24 2018 06/28/24 0443  BP: (!) 107/50 (!) 133/56 (!) 109/56 (!) 110/54  Pulse: 61 61 65 60  Resp:  17 (!) 21 20  Temp:   99.2 F (37.3 C) 98.4 F (36.9 C)  TempSrc:      SpO2:   93% 94%  Weight:      Height:        Intake/Output Summary (  Last 24 hours) at 06/28/2024 0952 Last data filed at 06/28/2024 0825 Gross per 24 hour  Intake 598 ml  Output 1300 ml  Net -702 ml   Filed Weights   06/22/24 0500 06/26/24 0500 06/27/24 0500  Weight: 49 kg 49.3 kg 49.5 kg    Examination:  General exam: Chronic ill frail  cachectic, awake alert, oriented to self only Respiratory system: Few scattered rhonchi, decreased breath sounds at the bases Cardiovascular system: S1 & S2 heard, RRR.  Abd: nondistended, soft and nontender.Normal bowel sounds heard. Extremities: Edema in flanks, 2+ lower extremities Skin: No rashes Psychiatry: Flat affect    Data Reviewed:   CBC: Recent Labs  Lab 06/24/24 0417 06/25/24 0712 06/27/24 0524 06/28/24 0643  WBC 8.9 8.1 7.1 9.4  NEUTROABS 6.5 5.9  --   --   HGB 10.6* 10.4* 9.2* 9.6*  HCT 31.7* 31.5* 28.5* 29.6*  MCV 81.3 82.5 84.6 83.6  PLT 193 226 206 235   Basic Metabolic Panel: Recent Labs  Lab 06/24/24 0417 06/25/24 0712 06/26/24 0658 06/27/24 0524 06/28/24 0643  NA 134* 136 136 138 138  K 3.6 4.0 4.1 3.6 3.7  CL 100 100 101 102 101  CO2 25 25 27 28 29   GLUCOSE 119* 106* 101* 99 100*  BUN 40* 39* 34* 39* 39*  CREATININE 1.27* 1.19* 1.05* 1.18* 1.02*  CALCIUM  9.5 9.5 9.7 9.6 9.7   GFR: Estimated Creatinine Clearance: 28.6 mL/min (A) (by C-G formula based on SCr of 1.02 mg/dL (H)). Liver Function Tests: Recent Labs  Lab 06/24/24 0417  AST 15  ALT 12  ALKPHOS 60  BILITOT 0.7  PROT 4.0*  ALBUMIN 1.6*   No results for input(s): LIPASE, AMYLASE in the last 168 hours. No results for input(s): AMMONIA in the last 168 hours. Coagulation Profile: No results for input(s): INR, PROTIME in the last 168 hours. Cardiac Enzymes: No results for input(s): CKTOTAL, CKMB, CKMBINDEX, TROPONINI in the last 168 hours. BNP (last 3 results) No results for input(s): PROBNP in the last 8760 hours. HbA1C: No results for input(s): HGBA1C in the last 72 hours. CBG: Recent Labs  Lab 06/24/24 2003 06/24/24 2333 06/25/24 0332  GLUCAP 136* 136* 110*   Lipid Profile: No results for input(s): CHOL, HDL, LDLCALC, TRIG, CHOLHDL, LDLDIRECT in the last 72 hours. Thyroid  Function Tests: No results for input(s): TSH, T4TOTAL,  FREET4, T3FREE, THYROIDAB in the last 72 hours. Anemia Panel: No results for input(s): VITAMINB12, FOLATE, FERRITIN, TIBC, IRON, RETICCTPCT in the last 72 hours. Urine analysis:    Component Value Date/Time   COLORURINE YELLOW 06/18/2024 1702   APPEARANCEUR HAZY (A) 06/18/2024 1702   LABSPEC 1.010 06/18/2024 1702   PHURINE 5.0 06/18/2024 1702   GLUCOSEU NEGATIVE 06/18/2024 1702   HGBUR NEGATIVE 06/18/2024 1702   BILIRUBINUR NEGATIVE 06/18/2024 1702   BILIRUBINUR Negative 03/28/2023 1607   KETONESUR NEGATIVE 06/18/2024 1702   PROTEINUR NEGATIVE 06/18/2024 1702   UROBILINOGEN 0.2 03/28/2023 1607   NITRITE NEGATIVE 06/18/2024 1702   LEUKOCYTESUR SMALL (A) 06/18/2024 1702   Sepsis Labs: @LABRCNTIP (procalcitonin:4,lacticidven:4)  ) Recent Results (from the past 240 hours)  Blood culture (routine x 2)     Status: Abnormal   Collection Time: 06/18/24  5:00 PM   Specimen: BLOOD  Result Value Ref Range Status   Specimen Description BLOOD RIGHT ANTECUBITAL  Final   Special Requests   Final    BOTTLES DRAWN AEROBIC AND ANAEROBIC Blood Culture results may not be optimal due to an inadequate  volume of blood received in culture bottles   Culture  Setup Time   Final    GRAM NEGATIVE COCCI IN BOTH AEROBIC AND ANAEROBIC BOTTLES CRITICAL VALUE NOTED.  VALUE IS CONSISTENT WITH PREVIOUSLY REPORTED AND CALLED VALUE. CORRECTED RESULTS PREVIOUSLY REPORTED AS: GRAM VARIABLE ROD CRITICAL RESULT CALLED TO, READ BACK BY AND VERIFIED WITH: PHARMD B.WANNARAT AT 9061 ON 06/21/2024 BY T.SAAD.    Culture (A)  Final    MORAXELLA CATARRHALIS(BRANHAMELLA) BETA LACTAMASE POSITIVE    Report Status 06/21/2024 FINAL  Final  Resp panel by RT-PCR (RSV, Flu A&B, Covid) Urine, Clean Catch     Status: None   Collection Time: 06/18/24  5:02 PM   Specimen: Urine, Clean Catch; Nasal Swab  Result Value Ref Range Status   SARS Coronavirus 2 by RT PCR NEGATIVE NEGATIVE Final   Influenza A by PCR  NEGATIVE NEGATIVE Final   Influenza B by PCR NEGATIVE NEGATIVE Final    Comment: (NOTE) The Xpert Xpress SARS-CoV-2/FLU/RSV plus assay is intended as an aid in the diagnosis of influenza from Nasopharyngeal swab specimens and should not be used as a sole basis for treatment. Nasal washings and aspirates are unacceptable for Xpert Xpress SARS-CoV-2/FLU/RSV testing.  Fact Sheet for Patients: BloggerCourse.com  Fact Sheet for Healthcare Providers: SeriousBroker.it  This test is not yet approved or cleared by the [place]  FDA and has been authorized for detection and/or diagnosis of SARS-CoV-2 by FDA under an Emergency Use Authorization (EUA). This EUA will remain in effect (meaning this test can be used) for the duration of the COVID-19 declaration under Section 564(b)(1) of the Act, 21 U.S.C. section 360bbb-3(b)(1), unless the authorization is terminated or revoked.     Resp Syncytial Virus by PCR NEGATIVE NEGATIVE Final    Comment: (NOTE) Fact Sheet for Patients: BloggerCourse.com  Fact Sheet for Healthcare Providers: SeriousBroker.it  This test is not yet approved or cleared by the [place]  FDA and has been authorized for detection and/or diagnosis of SARS-CoV-2 by FDA under an Emergency Use Authorization (EUA). This EUA will remain in effect (meaning this test can be used) for the duration of the COVID-19 declaration under Section 564(b)(1) of the Act, 21 U.S.C. section 360bbb-3(b)(1), unless the authorization is terminated or revoked.  Performed at [facility], [address] [place], [place] [address]   Blood culture (routine x 2)     Status: Abnormal   Collection Time: 06/18/24  5:18 PM   Specimen: BLOOD RIGHT ARM  Result Value Ref Range Status   Specimen Description BLOOD RIGHT ARM  Final   Special Requests   Final    BOTTLES DRAWN AEROBIC AND  ANAEROBIC Blood Culture results may not be optimal due to an inadequate volume of blood received in culture bottles   Culture  Setup Time   Final    GRAM NEGATIVE COCCI IN BOTH AEROBIC AND ANAEROBIC BOTTLES CRITICAL RESULT CALLED TO, READ BACK BY AND VERIFIED WITH: PHARMD J.MILLEN AT 1157 ON 06/19/2024 BY T.SAAD. PREVIOUSLY REPORTED AS: GRAM VARIABLE ROD CORRECTED RESULTS CALLED TO: PHARMD B.WANNARAT AT 9061 ON 06/21/2024 BY T.SAAD.    Culture (A)  Final    MORAXELLA CATARRHALIS(BRANHAMELLA) BETA LACTAMASE POSITIVE Performed at [facility], [address], [place], [place] [address]    Report Status 06/21/2024 FINAL  Final  Blood Culture ID Panel (Reflexed)     Status: None   Collection Time: 06/18/24  5:18 PM  Result Value Ref Range Status   Enterococcus faecalis  NOT DETECTED NOT DETECTED Final   Enterococcus Faecium NOT DETECTED NOT DETECTED Final   Listeria monocytogenes NOT DETECTED NOT DETECTED Final   Staphylococcus species NOT DETECTED NOT DETECTED Final   Staphylococcus aureus (BCID) NOT DETECTED NOT DETECTED Final   Staphylococcus epidermidis NOT DETECTED NOT DETECTED Final   Staphylococcus lugdunensis NOT DETECTED NOT DETECTED Final   Streptococcus species NOT DETECTED NOT DETECTED Final   Streptococcus agalactiae NOT DETECTED NOT DETECTED Final   Streptococcus pneumoniae NOT DETECTED NOT DETECTED Final   Streptococcus pyogenes NOT DETECTED NOT DETECTED Final   A.calcoaceticus-baumannii NOT DETECTED NOT DETECTED Final   Bacteroides fragilis NOT DETECTED NOT DETECTED Final   Enterobacterales NOT DETECTED NOT DETECTED Final   Enterobacter cloacae complex NOT DETECTED NOT DETECTED Final   Escherichia coli NOT DETECTED NOT DETECTED Final   Klebsiella aerogenes NOT DETECTED NOT DETECTED Final   Klebsiella oxytoca NOT DETECTED NOT DETECTED Final   Klebsiella pneumoniae NOT DETECTED NOT DETECTED Final   Proteus species NOT DETECTED NOT DETECTED Final   Salmonella  species NOT DETECTED NOT DETECTED Final   Serratia marcescens NOT DETECTED NOT DETECTED Final   Haemophilus influenzae NOT DETECTED NOT DETECTED Final   Neisseria meningitidis NOT DETECTED NOT DETECTED Final   Pseudomonas aeruginosa NOT DETECTED NOT DETECTED Final   Stenotrophomonas maltophilia NOT DETECTED NOT DETECTED Final   Candida albicans NOT DETECTED NOT DETECTED Final   Candida auris NOT DETECTED NOT DETECTED Final   Candida glabrata NOT DETECTED NOT DETECTED Final   Candida krusei NOT DETECTED NOT DETECTED Final   Candida parapsilosis NOT DETECTED NOT DETECTED Final   Candida tropicalis NOT DETECTED NOT DETECTED Final   Cryptococcus neoformans/gattii NOT DETECTED NOT DETECTED Final    Comment: Performed at [facility], [address] [place], [place] [address]  MRSA Next Gen by PCR, Nasal     Status: None   Collection Time: 06/18/24  7:07 PM   Specimen: Nasal Mucosa; Nasal Swab  Result Value Ref Range Status   MRSA by PCR Next Gen NOT DETECTED NOT DETECTED Final    Comment: (NOTE) The GeneXpert MRSA Assay (FDA approved for NASAL specimens only), is one component of a comprehensive MRSA colonization surveillance program. It is not intended to diagnose MRSA infection nor to guide or monitor treatment for MRSA infections. Test performance is not FDA approved in patients less than 67 years old. Performed at [facility], [address], [place], [place] [address]   Culture, blood (Routine X 2) w Reflex to ID Panel     Status: None   Collection Time: 06/20/24  3:42 PM   Specimen: BLOOD LEFT HAND  Result Value Ref Range Status   Specimen Description BLOOD LEFT HAND  Final   Special Requests   Final    BOTTLES DRAWN AEROBIC AND ANAEROBIC Blood Culture adequate volume   Culture   Final    NO GROWTH 5 DAYS Performed at [facility], [address], [place], [place] [address]    Report Status 06/25/2024 FINAL  Final  Culture, blood (Routine X 2) w Reflex to  ID Panel     Status: None   Collection Time: 06/20/24  3:44 PM   Specimen: BLOOD RIGHT HAND  Result Value Ref Range Status   Specimen Description BLOOD RIGHT HAND  Final   Special Requests   Final    BOTTLES DRAWN AEROBIC ONLY Blood Culture results may not be optimal due to an inadequate volume of  blood received in culture bottles   Culture   Final    NO GROWTH 5 DAYS Performed at [facility], [address] [place], [place] [address]    Report Status 06/25/2024 FINAL  Final  Culture, blood (Routine X 2) w Reflex to ID Panel     Status: None (Preliminary result)   Collection Time: 06/26/24  8:17 PM   Specimen: BLOOD  Result Value Ref Range Status   Specimen Description BLOOD SITE NOT SPECIFIED  Final   Special Requests Immunocompromised  Final   Culture   Final    NO GROWTH 2 DAYS Performed at [facility], [address], [place], [place] [address]    Report Status PENDING  Incomplete  Culture, blood (Routine X 2) w Reflex to ID Panel     Status: None (Preliminary result)   Collection Time: 06/26/24  8:20 PM   Specimen: BLOOD  Result Value Ref Range Status   Specimen Description BLOOD SITE NOT SPECIFIED  Final   Special Requests   Final    Immunocompromised BOTTLES DRAWN AEROBIC AND ANAEROBIC Blood Culture adequate volume   Culture   Final    NO GROWTH 2 DAYS Performed at [facility], [address], [place], [place] [address]    Report Status PENDING  Incomplete     Radiology Studies: No results found.   Scheduled Meds:  amiodarone  100 mg Oral Daily   apixaban  2.5 mg Oral BID   Chlorhexidine Gluconate Cloth  6 each Topical Daily   feeding supplement  237 mL Oral BID BM   furosemide   40 mg Intravenous BID   melatonin  3 mg Oral QHS   metoprolol tartrate  25 mg Oral BID   midodrine  10 mg Oral Q8H   QUEtiapine  25 mg Oral QHS   senna-docusate  1 tablet Oral BID   Continuous Infusions:   LOS: 10 days    Time spent:    Sigurd Pac, MD [facility]   06/28/2024, 9:52 AM

## 2024-06-28 NOTE — Progress Notes (Addendum)
 Progress Note  Patient Name: Briana Fitzgerald Date of Encounter: 06/28/2024 [facility] Cardiologist: Oneil Parchment, MD   Interval Summary   Interview was limited because patient was confused and had difficulty answering questions.  Per chart review this confusion does not appear to be new.  No family members are present for the interview.  Denies any chest pain or shortness of breath.   Vital Signs Vitals:   06/27/24 1107 06/27/24 1808 06/27/24 2018 06/28/24 0443  BP: (!) 107/50 (!) 133/56 (!) 109/56 (!) 110/54  Pulse: 61 61 65 60  Resp:  17 (!) 21 20  Temp:   99.2 F (37.3 C) 98.4 F (36.9 C)  TempSrc:      SpO2:   93% 94%  Weight:      Height:        Intake/Output Summary (Last 24 hours) at 06/28/2024 0751 Last data filed at 06/27/2024 2257 Gross per 24 hour  Intake 720 ml  Output 800 ml  Net -80 ml      06/27/2024    5:00 AM 06/26/2024    5:00 AM 06/22/2024    5:00 AM  Last 3 Weights  Weight (lbs) 109 lb 2 oz 108 lb 11 oz 108 lb 0.4 oz  Weight (kg) 49.5 kg 49.3 kg 49 kg      Telemetry/ECG  Currently in sinus bradycardia in the 50's and 60's.  Last episode of atrial fibrillation was on 06/26/2024- Personally Reviewed  Physical Exam  GEN: No acute distress.  Appearing cachectic. On 2L [place].  On interview patient was confused and had difficulty answering questions.  On chart review this appears to not be new. A&O 1/4. Neck: Unable to assess JVD due to patient not following directions Cardiac: RRR, no murmurs, rubs, or gallops.  Respiratory: Clear to auscultation bilaterally.  But difficulty assess due to patient not following directions. GI: Soft, nontender, non-distended  MS:1+ bilateral lower extremity edema  Assessment & Plan   89 y.o. female who is nonambulatory and living in a nursing facility with a mild dementia, history of HFpEF managed with diuretics, TIA, aortic root dilation, mild coronary calcifications on CT, aortoiliac atherosclerotic  calcifications, splenomegaly who presented on 10/3 with fever, hypotension, AMS, rash on left hand and septic shock and atrial fibrillation with RVR which developed in the emergency room. Initially treated with IV diltiazem but then developed hypotension. She was then treated with norepinephrine and IV amiodarone with IV fluids. Cardiology consulted for new atrial fibrillation management.     Moraxella catarrhalis pneumonia Septic shock - resolving Was able to DC levophed. Completed 7 days of IV ceftriaxone, repeat blood cultures are negative  Most recent pressure 110/54 Midodrine this a.m. was decreased to 5 mg every 8 hours.  Monitor blood pressure if stable plan to DC midorine.   New onset atrial fibrillation/flutter with RVR CHA2DS2-VASc Score = 7 [CHF History: 1, HTN History: 0, Diabetes History: 0, Stroke History: 2, Vascular Disease History: 1, Age Score: 2, Gender Score: 1].  Therefore, the patient's annual risk of stroke is 11.2 %.    In the setting of pneumonia and septic shock as mentioned above. Remains in sinus rhythm Continue oral amiodarone 100 mg daily. Continue Eliquis 2.5 mg BID (age, weight). Patient is at an increased fall risk due to frailty but she has an elevated CHADS2-Vasc score, is non ambulatory (elevated DVT risk), and lives in a nursing home so plan to continue on d/c. Stop metoprolol tartrate 25 mg twice daily.  Start metoprolol succinate 25 mg daily   Acute on chronic HFpEF Hypoalbuminemia Echo on 06/19/24 showed normal LVEF of 50-55%, mild MR, and Severe MAC. Albumin was 1.6.  This is likely contributing to edema. Was started on 40mg  IV lasix . Weight is down 20 lbs and I/O are up 0.8 L since admission. Creatinine 1.02. Order chest x-ray Continue 40 mg IV Lasix  twice daily. If chest x-ray is reassuring reasonable to consider transitioning to oral torsemide  tomorrow.   Management per primary Shingles of left hand UTI AKI -resolved Lactic acidosis,  resolved Normocytic anemia Thrombocytopenia Hypokalemia Asymptomatic Coronary/aortic atherosclerosis Splenomegaly Dilated ascending aorta    For questions or updates, please contact [facility] Please consult [email or web address] for contact info under        Signed, Joahan Swatzell, PA-C

## 2024-06-28 NOTE — TOC Progression Note (Signed)
 Transition of Care [facility]) - Progression Note    Patient Details  Name: Briana Fitzgerald MRN: [id number] Date of Birth: Aug 25, 1932  [facility] CM/SW Contact  Jheremy Boger LITTIE [place], [place] Phone Number: 06/28/2024, 10:12 AM  Clinical Narrative:    CSW spoke with pt son, Zachary, over the phone regarding SNF placement. Zachary stated he and pt spouse would also like to consider residential hospice if pt is a candidate. CSW emailed [facility] resources as well as SNF bed offers for pt. CSW will continue to follow.   Expected Discharge Plan: Skilled Nursing Facility Barriers to Discharge: Continued Medical Work up, English as a second language teacher, SNF Pending bed offer               Expected Discharge Plan and Services In-house Referral: Clinical Social Work     Living arrangements for the past 2 months: Assisted Living Facility                                       Social Drivers of Health (SDOH) Interventions SDOH Screenings   Food Insecurity: No Food Insecurity (06/23/2024)  Housing: Low Risk  (06/23/2024)  Transportation Needs: No Transportation Needs (06/23/2024)  Utilities: Not At Risk (06/23/2024)  Alcohol Screen: Low Risk  (05/27/2024)  Depression (PHQ2-9): Low Risk  (10/01/2023)  Financial Resource Strain: Low Risk  (05/27/2024)  Physical Activity: Inactive (05/27/2024)  Social Connections: Unknown (06/23/2024)  Recent Concern: Social Connections - Socially Isolated (05/27/2024)  Stress: No Stress Concern Present (05/27/2024)  Tobacco Use: Low Risk  (06/18/2024)    Readmission Risk Interventions     No data to display

## 2024-06-28 NOTE — Plan of Care (Signed)

## 2024-06-29 DIAGNOSIS — I5030 Unspecified diastolic (congestive) heart failure: Secondary | ICD-10-CM

## 2024-06-29 DIAGNOSIS — Z7189 Other specified counseling: Secondary | ICD-10-CM | POA: Diagnosis not present

## 2024-06-29 DIAGNOSIS — A419 Sepsis, unspecified organism: Secondary | ICD-10-CM | POA: Diagnosis not present

## 2024-06-29 DIAGNOSIS — Z789 Other specified health status: Secondary | ICD-10-CM | POA: Diagnosis not present

## 2024-06-29 DIAGNOSIS — I959 Hypotension, unspecified: Secondary | ICD-10-CM

## 2024-06-29 DIAGNOSIS — Z515 Encounter for palliative care: Secondary | ICD-10-CM | POA: Diagnosis not present

## 2024-06-29 DIAGNOSIS — I4891 Unspecified atrial fibrillation: Secondary | ICD-10-CM | POA: Diagnosis not present

## 2024-06-29 LAB — BASIC METABOLIC PANEL WITH GFR
Anion gap: 9 (ref 5–15)
BUN: 35 mg/dL — ABNORMAL HIGH (ref 8–23)
CO2: 30 mmol/L (ref 22–32)
Calcium: 9.4 mg/dL (ref 8.9–10.3)
Chloride: 101 mmol/L (ref 98–111)
Creatinine, Ser: 1.01 mg/dL — ABNORMAL HIGH (ref 0.44–1.00)
GFR, Estimated: 53 mL/min — ABNORMAL LOW (ref >60)
Glucose, Bld: 94 mg/dL (ref 70–99)
Potassium: 3.4 mmol/L — ABNORMAL LOW (ref 3.5–5.1)
Sodium: 140 mmol/L (ref 135–145)

## 2024-06-29 MED ORDER — FUROSEMIDE 10 MG/ML IJ SOLN
40.0000 mg | Freq: Two times a day (BID) | INTRAMUSCULAR | Status: DC
  Administered 2024-06-29: 40 mg via INTRAVENOUS
  Filled 2024-06-29: qty 4

## 2024-06-29 MED ORDER — POTASSIUM CHLORIDE 20 MEQ PO PACK
40.0000 meq | PACK | Freq: Two times a day (BID) | ORAL | Status: AC
  Administered 2024-06-29 (×2): 40 meq via ORAL
  Filled 2024-06-29 (×2): qty 2

## 2024-06-29 MED ORDER — FUROSEMIDE 10 MG/ML IJ SOLN: 60.0000 mg | Freq: Three times a day (TID) | INTRAMUSCULAR | Status: DC

## 2024-06-29 MED ORDER — SODIUM CHLORIDE 0.9 % IV SOLN
3.0000 g | Freq: Four times a day (QID) | INTRAVENOUS | Status: DC
  Administered 2024-06-29 – 2024-07-02 (×14): 3 g via INTRAVENOUS
  Filled 2024-06-29 (×14): qty 8

## 2024-06-29 NOTE — Plan of Care (Signed)

## 2024-06-29 NOTE — Progress Notes (Signed)
 Pharmacy Antibiotic Note  Briana Fitzgerald is a 89 y.o. female admitted on 06/18/2024 with pneumonia.  Pharmacy has been consulted for unasyn dosing.  Plan: Unasyn 3 gm iv q6h  Height: 5' 3.5 (161.3 cm) Weight: 60.6 kg (133 lb 9.6 oz) IBW/kg (Calculated) : 53.55  Temp (24hrs), Avg:98.7 F (37.1 C), Min:98.4 F (36.9 C), Max:99 F (37.2 C)  Recent Labs  Lab 06/24/24 0417 06/25/24 0712 06/26/24 0658 06/27/24 0524 06/28/24 0643 06/29/24 0424  WBC 8.9 8.1  --  7.1 9.4  --   CREATININE 1.27* 1.19* 1.05* 1.18* 1.02* 1.01*    Estimated Creatinine Clearance: 31.3 mL/min (A) (by C-G formula based on SCr of 1.01 mg/dL (H)).    Allergies  Allergen Reactions   Penicillins Hives, Rash and Other (See Comments)    Blister with PCN     Thank you for allowing pharmacy to be a part of this patient's care.    Benedetta Heath BS, PharmD, BCPS Clinical Pharmacist 06/29/2024 7:30 AM  Contact: [phone] after 3 PM

## 2024-06-29 NOTE — Progress Notes (Signed)
 PROGRESS NOTE    Briana Fitzgerald  FMW:[id number] DOB: [birth year] DOA: 06/18/2024 PCP: Micheal Wolm ORN, MD   89 y.o. female with PMH significant for mild dementia, diastolic CHF, TIA. Lives at [facility] assisted living facility, hard of hearing, alert and oriented at baseline. 10/3, patient was brought to the ED with complaint of a rash on her hand and altered mental status.  In the ED she was febrile with septic shock, complicated by A-fib RVR and hypotension Chest x-ray showed bibasilar opacities, left upper lobe opacity. -Treated with pressor support in ICU, Amio gtt. Blood cultures grew Moraxella catarrhalis  While in ICU, patient was delirious and required Precedex drip for about 48 hours.  Was able to come off Levophed in about 72 hours -Transferred from PCCM to TRH service - 10/10, significantly volume overloaded with third spacing, albumin is 1.6, started on IV Lasix  - Overall prognosis is poor, palliative care following - Repeat x-ray 10/13 with worsening pneumonia and CHF, restarted IV ABX  Subjective: - Frail, mild tachypnea overnight, x-ray with worsening CHF and pneumonia  Assessment and Plan:  Septic shock POA Multifocal pneumonia Moraxella catarrhalis bacteremia -presented in septic shock due to pneumonia requiring pressors in ICU. Chest x-ray showed multifocal pneumonia Blood cultures-grew Moraxella catarrhalis Completed 7 days of IV ceftriaxone, repeat blood cultures are negative -Continue midodrine - SLP following for dysphagia - Repeat x-ray for tachypnea overnight with worsening infiltrates, suspect ongoing aspiration, restart Unasyn today  Goals: overall prognosis is poor, with advanced age, cachexia severe hypoalbuminemia, CHF, A-fib, mild dementia and worsening confusion - Discussed poor prognosis with spouse and son, I am concerned about her ability to have a meaningful recovery, palliative care following - Discussed hospice at discharge back to ALF  with family, they are open to this suggestion  A-fib with RVR New onset A-fib Developed RVR in the setting of septic shock Cards following, started on IV amiodarone, now switched to p.o. Started on anticoagulation with Eliquis 2.5 mg twice daily  Acute on chronic diastolic CHF Hypoalbuminemia and third spacing Patient has bilateral lower EXTR edema and low albumin of 1.6. PTA meds- torsemide  which is currently on hold - She has fair bit of flank, thigh and lower leg edema from third spacing - Continue IV Lasix  today, given albumin few days ago  Severe hypoalbuminemia and malnutrition -Prognosis is poor, palliative care following   Left forearm laceration -Clinically do not suspect shingles   Acute metabolic encephalopathy Underlying mild dementia While in ICU, patient was delirious and required Precedex drip for about 48 hours. Currently on Seroquel 25 mg nightly, melatonin 3 mg nightly   Dysphagia Seen by ST. High risk of aspiration Currently on dysphagia 1 diet   AKI on CKD 2 Baseline creatinine normal. Creatinine elevated in the setting of septic shock Now improving  Recurrent urinary retention - Recurrent retention, Foley catheter replaced on 10/7, now on bethanechol - Anticipate need for Foley at discharge  Hypoalbuminemia Albumin low at 1.6.  Poor nutrition, now with anasarca consult dietitian   Restless legs Was on pramipexole  0.5 mg nightly.  Currently on hold   DVT prophylaxis: Apixaban Code Status: DNR Family Communication: Discussed with spouse and son Disposition Plan: Back to ALF with hospice   Antimicrobials:    Objective: Vitals:   06/29/24 0500 06/29/24 0540 06/29/24 0843 06/29/24 0925  BP:  (!) 103/49 (!) 104/51 (!) 104/51  Pulse:  (!) 59 62 62  Resp:  16 17   Temp:  98.6  F (37 C) 98.6 F (37 C)   TempSrc:  Oral Oral   SpO2:  91% 95%   Weight: 60.6 kg     Height:        Intake/Output Summary (Last 24 hours) at 06/29/2024  1142 Last data filed at 06/29/2024 0540 Gross per 24 hour  Intake 295 ml  Output 2300 ml  Net -2005 ml   Filed Weights   06/26/24 0500 06/27/24 0500 06/29/24 0500  Weight: 49.3 kg 49.5 kg 60.6 kg    Examination:  General exam: Chronic ill frail cachectic, awake alert, oriented to self, mild tachypnea Respiratory system: Scattered rhonchi Cardiovascular system: S1 & S2 heard, RRR.  Abd: nondistended, soft and nontender.Normal bowel sounds heard. Extremities: Edema in lower back, flanks, 2+ lower extremities GU: Foley catheter Skin: No rashes Psychiatry: Flat affect    Data Reviewed:   CBC: Recent Labs  Lab 06/24/24 0417 06/25/24 0712 06/27/24 0524 06/28/24 0643  WBC 8.9 8.1 7.1 9.4  NEUTROABS 6.5 5.9  --   --   HGB 10.6* 10.4* 9.2* 9.6*  HCT 31.7* 31.5* 28.5* 29.6*  MCV 81.3 82.5 84.6 83.6  PLT 193 226 206 235   Basic Metabolic Panel: Recent Labs  Lab 06/25/24 0712 06/26/24 0658 06/27/24 0524 06/28/24 0643 06/29/24 0424  NA 136 136 138 138 140  K 4.0 4.1 3.6 3.7 3.4*  CL 100 101 102 101 101  CO2 25 27 28 29 30   GLUCOSE 106* 101* 99 100* 94  BUN 39* 34* 39* 39* 35*  CREATININE 1.19* 1.05* 1.18* 1.02* 1.01*  CALCIUM  9.5 9.7 9.6 9.7 9.4   GFR: Estimated Creatinine Clearance: 31.3 mL/min (A) (by C-G formula based on SCr of 1.01 mg/dL (H)). Liver Function Tests: Recent Labs  Lab 06/24/24 0417  AST 15  ALT 12  ALKPHOS 60  BILITOT 0.7  PROT 4.0*  ALBUMIN 1.6*   No results for input(s): LIPASE, AMYLASE in the last 168 hours. No results for input(s): AMMONIA in the last 168 hours. Coagulation Profile: No results for input(s): INR, PROTIME in the last 168 hours. Cardiac Enzymes: No results for input(s): CKTOTAL, CKMB, CKMBINDEX, TROPONINI in the last 168 hours. BNP (last 3 results) No results for input(s): PROBNP in the last 8760 hours. HbA1C: No results for input(s): HGBA1C in the last 72 hours. CBG: Recent Labs  Lab  06/24/24 2003 06/24/24 2333 06/25/24 0332 06/28/24 2101  GLUCAP 136* 136* 110* 121*   Lipid Profile: No results for input(s): CHOL, HDL, LDLCALC, TRIG, CHOLHDL, LDLDIRECT in the last 72 hours. Thyroid  Function Tests: No results for input(s): TSH, T4TOTAL, FREET4, T3FREE, THYROIDAB in the last 72 hours. Anemia Panel: No results for input(s): VITAMINB12, FOLATE, FERRITIN, TIBC, IRON, RETICCTPCT in the last 72 hours. Urine analysis:    Component Value Date/Time   COLORURINE YELLOW 06/18/2024 1702   APPEARANCEUR HAZY (A) 06/18/2024 1702   LABSPEC 1.010 06/18/2024 1702   PHURINE 5.0 06/18/2024 1702   GLUCOSEU NEGATIVE 06/18/2024 1702   HGBUR NEGATIVE 06/18/2024 1702   BILIRUBINUR NEGATIVE 06/18/2024 1702   BILIRUBINUR Negative 03/28/2023 1607   KETONESUR NEGATIVE 06/18/2024 1702   PROTEINUR NEGATIVE 06/18/2024 1702   UROBILINOGEN 0.2 03/28/2023 1607   NITRITE NEGATIVE 06/18/2024 1702   LEUKOCYTESUR SMALL (A) 06/18/2024 1702   Sepsis Labs: @LABRCNTIP (procalcitonin:4,lacticidven:4)  ) Recent Results (from the past 240 hours)  Culture, blood (Routine X 2) w Reflex to ID Panel     Status: None   Collection Time: 06/20/24  3:42 PM  Specimen: BLOOD LEFT HAND  Result Value Ref Range Status   Specimen Description BLOOD LEFT HAND  Final   Special Requests   Final    BOTTLES DRAWN AEROBIC AND ANAEROBIC Blood Culture adequate volume   Culture   Final    NO GROWTH 5 DAYS Performed at [facility], [address], [place], [place] [address]    Report Status 06/25/2024 FINAL  Final  Culture, blood (Routine X 2) w Reflex to ID Panel     Status: None   Collection Time: 06/20/24  3:44 PM   Specimen: BLOOD RIGHT HAND  Result Value Ref Range Status   Specimen Description BLOOD RIGHT HAND  Final   Special Requests   Final    BOTTLES DRAWN AEROBIC ONLY Blood Culture results may not be optimal due to an inadequate volume of blood received in culture  bottles   Culture   Final    NO GROWTH 5 DAYS Performed at [facility], [address], [place], [place] [address]    Report Status 06/25/2024 FINAL  Final  Culture, blood (Routine X 2) w Reflex to ID Panel     Status: None (Preliminary result)   Collection Time: 06/26/24  8:17 PM   Specimen: BLOOD  Result Value Ref Range Status   Specimen Description BLOOD SITE NOT SPECIFIED  Final   Special Requests Immunocompromised  Final   Culture   Final    NO GROWTH 3 DAYS Performed at [facility], [address] [place], [place] [address]    Report Status PENDING  Incomplete  Culture, blood (Routine X 2) w Reflex to ID Panel     Status: None (Preliminary result)   Collection Time: 06/26/24  8:20 PM   Specimen: BLOOD  Result Value Ref Range Status   Specimen Description BLOOD SITE NOT SPECIFIED  Final   Special Requests   Final    Immunocompromised BOTTLES DRAWN AEROBIC AND ANAEROBIC Blood Culture adequate volume   Culture   Final    NO GROWTH 3 DAYS Performed at [facility], [address] [place], [place] [address]    Report Status PENDING  Incomplete     Radiology Studies: DG CHEST PORT 1 VIEW Result Date: 06/28/2024 CLINICAL DATA:  Heart failure. EXAM: PORTABLE CHEST 1 VIEW COMPARISON:  06/18/2024 FINDINGS: The heart is enlarged. Aortic atherosclerosis and mitral annulus calcifications. Increasing bilateral pleural effusions, more so on the left. Redemonstrated opacity in the left perihilar region. Increasing patchy right lung base opacity. Vascular congestion. No pneumothorax. IMPRESSION: 1. Increasing bilateral pleural effusions, more so on the left. 2. Increasing patchy right lung base opacity, atelectasis versus pneumonia. 3. Redemonstrated opacity in the left perihilar region. Differential considerations include infection or neoplasm. 4. Cardiomegaly with vascular congestion. Electronically Signed   By: Andrea Gasman M.D.   On: 06/28/2024 16:15      Scheduled Meds:  amiodarone  100 mg Oral Daily   apixaban  2.5 mg Oral BID   Chlorhexidine Gluconate Cloth  6 each Topical Daily   feeding supplement  237 mL Oral BID BM   furosemide   40 mg Intravenous BID   melatonin  3 mg Oral QHS   metoprolol succinate  25 mg Oral Daily   midodrine  5 mg Oral BID WC   potassium chloride   40 mEq Oral BID   QUEtiapine  25 mg Oral QHS   senna-docusate  1 tablet Oral BID   Continuous Infusions:  ampicillin-sulbactam (UNASYN) IV 3 g (06/29/24 0853)     LOS: 11 days    Time spent:    Sigurd Pac, MD [facility]   06/29/2024, 11:42 AM

## 2024-06-29 NOTE — Progress Notes (Addendum)
 Progress Note  Patient Name: Briana Fitzgerald Date of Encounter: 06/29/2024 [facility] Cardiologist: Oneil Parchment, MD   Interval Summary   Patient was sitting up in bed at 50 degrees during interview.  Reports orthopnea when laying flat.  Does have some lower extremity edema. patient's husband and son were present for the interview.  Denies any chest pain or shortness of breath.  Vital Signs Vitals:   06/28/24 2238 06/29/24 0500 06/29/24 0540 06/29/24 0843  BP: (!) 120/57  (!) 103/49 (!) 104/51  Pulse: 65  (!) 59 62  Resp: 16  16 17   Temp: 98.4 F (36.9 C)  98.6 F (37 C) 98.6 F (37 C)  TempSrc: Oral  Oral Oral  SpO2: 92%  91% 95%  Weight:  60.6 kg    Height:        Intake/Output Summary (Last 24 hours) at 06/29/2024 0920 Last data filed at 06/29/2024 0540 Gross per 24 hour  Intake 295 ml  Output 2400 ml  Net -2105 ml      06/29/2024    5:00 AM 06/27/2024    5:00 AM 06/26/2024    5:00 AM  Last 3 Weights  Weight (lbs) 133 lb 9.6 oz 109 lb 2 oz 108 lb 11 oz  Weight (kg) 60.6 kg 49.5 kg 49.3 kg      Telemetry/ECG  Normal sinus rhythm in the 50's and 60's - Personally Reviewed  Physical Exam  GEN: No acute distress.   Neck: Positive JVD to jaw Cardiac: RRR, no murmurs, rubs, or gallops.  Respiratory: Bilateral crackles.  Diminished breath sounds on lower lobes. GI: Soft, nontender, non-distended  MS: 2+ bilateral lower extremity edema  Assessment & Plan   89 y.o. female who is nonambulatory and living in a nursing facility with a mild dementia, history of HFpEF managed with diuretics, TIA, aortic root dilation, mild coronary calcifications on CT, aortoiliac atherosclerotic calcifications, splenomegaly who presented on 10/3 with fever, hypotension, AMS, rash on left hand and septic shock and atrial fibrillation with RVR which developed in the emergency room. Initially treated with IV diltiazem but then developed hypotension. She was then treated with  norepinephrine and IV amiodarone with IV fluids. Cardiology consulted for new atrial fibrillation management.       Moraxella catarrhalis pneumonia Septic shock - resolving Was able to DC levophed. Completed 7 days of IV ceftriaxone, repeat blood cultures are negative  Most recent pressure 104/54 Continue midodrine 5 mg every 8 hours.      New onset atrial fibrillation/flutter with RVR Hypokalemia CHA2DS2-VASc Score = 7 [CHF History: 1, HTN History: 0, Diabetes History: 0, Stroke History: 2, Vascular Disease History: 1, Age Score: 2, Gender Score: 1].  Therefore, the patient's annual risk of stroke is 11.2 %.    In the setting of pneumonia and septic shock as mentioned above. Remains in sinus rhythm Has a potassium of 3.4. ordered potassium replacement. Continue oral amiodarone 100 mg daily. Continue Eliquis 2.5 mg BID (age, weight). Patient is at an increased fall risk due to frailty but she has an elevated CHADS2-Vasc score, is non ambulatory (elevated DVT risk), and lives in a nursing home so plan to continue on d/c. continue metoprolol succinate 25 mg daily.     Acute on chronic HFpEF Hypoalbuminemia Echo on 06/19/24 showed normal LVEF of 50-55%, mild MR, and Severe MAC. Albumin was 1.6.  This is likely contributing to edema. Has received albumin replacement. Was started on 40mg  IV lasix  on 06/25/24 I/O  are net out 2.5L over past day. Weights don't appear to be accurate.  Family reported baseline weight is about 120. creatinine stable at 1.01. Chest x-ray showed increasing bilateral pleural effusions, patchy right lung base opacity, atelectasis versus pneumonia, Redemonstrated opacity in the left perihilar region. Differential considerations include infection or neoplasm, and cardiomegaly with vascular congestion. Reduce Lasix  to 40 mg twice daily due to good I's and O's over the past day on this dose and concerns of overdiuresis. Will discuss with Dr Joelle.  Continue strict I's and  O's     Otherwise management per primary    For questions or updates, please contact [facility] Please consult [email or web address] for contact info under        Signed, Andjela Wickes, PA-C

## 2024-06-30 ENCOUNTER — Inpatient Hospital Stay (HOSPITAL_COMMUNITY)

## 2024-06-30 DIAGNOSIS — Z515 Encounter for palliative care: Secondary | ICD-10-CM | POA: Diagnosis not present

## 2024-06-30 DIAGNOSIS — I959 Hypotension, unspecified: Secondary | ICD-10-CM | POA: Diagnosis not present

## 2024-06-30 DIAGNOSIS — Z7189 Other specified counseling: Secondary | ICD-10-CM | POA: Diagnosis not present

## 2024-06-30 DIAGNOSIS — I4891 Unspecified atrial fibrillation: Secondary | ICD-10-CM | POA: Diagnosis not present

## 2024-06-30 DIAGNOSIS — Z789 Other specified health status: Secondary | ICD-10-CM | POA: Diagnosis not present

## 2024-06-30 DIAGNOSIS — A419 Sepsis, unspecified organism: Secondary | ICD-10-CM | POA: Diagnosis not present

## 2024-06-30 DIAGNOSIS — I5030 Unspecified diastolic (congestive) heart failure: Secondary | ICD-10-CM | POA: Diagnosis not present

## 2024-06-30 DIAGNOSIS — I5031 Acute diastolic (congestive) heart failure: Secondary | ICD-10-CM

## 2024-06-30 LAB — CBC
HCT: 29.9 % — ABNORMAL LOW (ref 36.0–46.0)
Hemoglobin: 9.9 g/dL — ABNORMAL LOW (ref 12.0–15.0)
MCH: 27.7 pg (ref 26.0–34.0)
MCHC: 33.1 g/dL (ref 30.0–36.0)
MCV: 83.5 fL (ref 80.0–100.0)
Platelets: 202 K/uL (ref 150–400)
RBC: 3.58 MIL/uL — ABNORMAL LOW (ref 3.87–5.11)
RDW: 15.3 % (ref 11.5–15.5)
WBC: 8.1 K/uL (ref 4.0–10.5)
nRBC: 0 % (ref 0.0–0.2)

## 2024-06-30 LAB — ECHOCARDIOGRAM LIMITED
AR max vel: 1.71 cm2
AV Area VTI: 1.87 cm2
AV Area mean vel: 1.62 cm2
AV Mean grad: 6 mmHg
AV Peak grad: 13.7 mmHg
Ao pk vel: 1.85 m/s
Area-P 1/2: 3.08 cm2
Calc EF: 74.7 %
Height: 63.5 in
MV VTI: 1.41 cm2
S' Lateral: 2.7 cm
Single Plane A2C EF: 77.3 %
Single Plane A4C EF: 73.3 %
Weight: 2137.58 [oz_av]

## 2024-06-30 LAB — BASIC METABOLIC PANEL WITH GFR
Anion gap: 8 (ref 5–15)
BUN: 33 mg/dL — ABNORMAL HIGH (ref 8–23)
CO2: 28 mmol/L (ref 22–32)
Calcium: 9.4 mg/dL (ref 8.9–10.3)
Chloride: 103 mmol/L (ref 98–111)
Creatinine, Ser: 1.03 mg/dL — ABNORMAL HIGH (ref 0.44–1.00)
GFR, Estimated: 52 mL/min — ABNORMAL LOW (ref >60)
Glucose, Bld: 96 mg/dL (ref 70–99)
Potassium: 3.9 mmol/L (ref 3.5–5.1)
Sodium: 139 mmol/L (ref 135–145)

## 2024-06-30 MED ORDER — POTASSIUM CHLORIDE 20 MEQ PO PACK
20.0000 meq | PACK | Freq: Every day | ORAL | Status: DC
  Administered 2024-06-30 – 2024-07-05 (×6): 20 meq via ORAL
  Filled 2024-06-30 (×6): qty 1

## 2024-06-30 MED ORDER — FUROSEMIDE 10 MG/ML IJ SOLN
60.0000 mg | Freq: Three times a day (TID) | INTRAMUSCULAR | Status: DC
  Administered 2024-06-30 – 2024-07-02 (×7): 60 mg via INTRAVENOUS
  Filled 2024-06-30 (×7): qty 6

## 2024-06-30 MED ORDER — MIDODRINE HCL 5 MG PO TABS
5.0000 mg | ORAL_TABLET | Freq: Three times a day (TID) | ORAL | Status: DC
  Administered 2024-06-30 – 2024-07-05 (×17): 5 mg via ORAL
  Filled 2024-06-30 (×17): qty 1

## 2024-06-30 MED ORDER — ALBUMIN HUMAN 25 % IV SOLN
25.0000 g | Freq: Four times a day (QID) | INTRAVENOUS | Status: AC
  Administered 2024-06-30 (×2): 25 g via INTRAVENOUS
  Filled 2024-06-30 (×2): qty 100

## 2024-06-30 NOTE — TOC Progression Note (Signed)
 Transition of Care [facility]) - Progression Note    Patient Details  Name: [name] MRN: [id number] Date of Birth: [birth year]  [facility]) CM/SW Contact  Happy Begeman LITTIE Moose, [place] Phone Number: 06/30/2024, 3:22 PM  Clinical Narrative:    CSW spoke with Rebekah at [facility] about pt returning with hospice following. Rebekah asked for nursing progress notes and therapy notes to review before making their decision. CSW faxed notes over to facility. CSW will continue to follow.   Expected Discharge Plan: Skilled Nursing Facility Barriers to Discharge: Continued Medical Work up, English as a second language teacher, SNF Pending bed offer               Expected Discharge Plan and Services In-house Referral: Clinical Social Work     Living arrangements for the past 2 months: Assisted Living Facility                                       Social Drivers of Health (SDOH) Interventions SDOH Screenings   Food Insecurity: No Food Insecurity (06/23/2024)  Housing: Low Risk  (06/23/2024)  Transportation Needs: No Transportation Needs (06/23/2024)  Utilities: Not At Risk (06/23/2024)  Alcohol Screen: Low Risk  (05/27/2024)  Depression (PHQ2-9): Low Risk  (10/01/2023)  Financial Resource Strain: Low Risk  (05/27/2024)  Physical Activity: Inactive (05/27/2024)  Social Connections: Unknown (06/23/2024)  Recent Concern: Social Connections - Socially Isolated (05/27/2024)  Stress: No Stress Concern Present (05/27/2024)  Tobacco Use: Low Risk  (06/18/2024)    Readmission Risk Interventions     No data to display

## 2024-06-30 NOTE — Progress Notes (Signed)
 All dressings changed. Skin on right arm continues to weep but there was less drainage this morning than last night. Bilateral leg wraps were removed this morning and they appeared to be dry. Heels floated and wraps left off. Patient's inner thighs are still raw. New foam pads placed as well as a foam dressing on patients sacral area. Barrier cream applied to bottom and back. Patient has times of confusion and times that she knows her name, birthday and that she's in the hospital. Calm and cooperative.

## 2024-06-30 NOTE — Progress Notes (Signed)
 Briana Fitzgerald  FMW:[id number] DOB: Aug 16, 1932 DOA: 06/18/2024 PCP: Micheal Wolm ORN, MD   89 y.o. female with PMH significant for mild dementia, diastolic CHF, TIA. Lives at [facility] assisted living facility, hard of hearing, alert and oriented at baseline. 10/3, patient was brought to the ED with complaint of a rash on her hand and altered mental status.  In the ED she was febrile with septic shock, complicated by A-fib RVR and hypotension Chest x-ray showed bibasilar opacities, left upper lobe opacity. -Treated with pressor support in ICU, Amio gtt. Blood cultures grew Moraxella catarrhalis  While in ICU, patient was delirious and required Precedex drip for about 48 hours.  Was able to come off Levophed in about 72 hours -Transferred from PCCM to TRH service - 10/10, significantly volume overloaded with third spacing, albumin is 1.6, started on IV Lasix  - Overall prognosis is poor, palliative care following - Repeat x-ray 10/13 with worsening pneumonia and CHF, restarted IV ABX -10/13 and 10/14: I had Goals of care discussion with patient's spouse and son, plan to medically optimize and then discharged back to ALF with hospice services  Subjective: - Breathing a little better, still short of breath with minimal activity, fair bit of swelling in her flanks/thigh/hip  Assessment and Plan:  Septic shock POA Multifocal pneumonia Moraxella catarrhalis bacteremia -presented in septic shock due to pneumonia requiring pressors in ICU. Chest x-ray showed multifocal pneumonia Blood cultures-grew Moraxella catarrhalis Completed 7 days of IV ceftriaxone, repeat blood cultures are negative -Continue midodrine - SLP following for dysphagia - Repeat x-ray for tachypnea overnight with worsening infiltrates, suspect ongoing aspiration, restart Unasyn 10/14, switch to oral ABX tomorrow  Goals: overall prognosis is poor, with advanced age, cachexia severe hypoalbuminemia,  CHF, A-fib, mild dementia and worsening confusion - Discussed poor prognosis with spouse and son, I am concerned about her ability to have a meaningful recovery, palliative care following - After discussion with patient's son and spouse, plan is for medical optimization if possible followed by discharge back to assisted living with hospice services - [facility] consult for hospice  A-fib with RVR New onset A-fib Developed RVR in the setting of septic shock Cards following, started on IV amiodarone, now switched to p.o., metoprolol Started on low-dose Eliquis  Acute on chronic diastolic CHF Hypoalbuminemia and third spacing Patient has bilateral lower EXTR edema and low albumin of 1.6. PTA meds- torsemide  which is currently on hold - She has fair bit of flank, thigh and lower leg edema from third spacing - Continue IV Lasix  today, given albumin few days ago, repeat IV albumin today - Weights likely inaccurate, will request repeat  Severe hypoalbuminemia and malnutrition -Prognosis is poor, palliative care following   Left forearm laceration -Clinically do not suspect shingles   Acute metabolic encephalopathy Underlying mild dementia While in ICU, patient was delirious and required Precedex drip for about 48 hours. Currently on Seroquel 25 mg nightly, melatonin 3 mg nightly   Dysphagia Seen by ST. High risk of aspiration Currently on dysphagia 1 diet   AKI on CKD 2 Baseline creatinine normal. Creatinine elevated in the setting of septic shock Now improving  Recurrent urinary retention - Recurrent retention, Foley catheter replaced on 10/7, now on bethanechol - Anticipate need for Foley at discharge  Hypoalbuminemia Albumin low at 1.6.  Poor nutrition, now with anasarca consult dietitian   Restless legs Was on pramipexole  0.5 mg nightly.  Currently on hold   DVT prophylaxis: Apixaban  Code Status: DNR Family Communication: Discussed with spouse and son 10/13 and  10/14 Disposition Plan: Back to ALF with hospice   Antimicrobials:    Objective: Vitals:   06/29/24 1728 06/29/24 2053 06/30/24 0603 06/30/24 0915  BP: (!) 116/54 (!) 117/58 (!) 107/54 (!) 109/53  Pulse: 61 60 (!) 59 61  Resp: 15   17  Temp: 98.2 F (36.8 C) 99.5 F (37.5 C) 97.6 F (36.4 C) (!) 97.5 F (36.4 C)  TempSrc: Oral Oral Oral   SpO2: 94% 94% 96% 99%  Weight:      Height:        Intake/Output Summary (Last 24 hours) at 06/30/2024 1150 Last data filed at 06/30/2024 0825 Gross per 24 hour  Intake 960 ml  Output 1100 ml  Net -140 ml   Filed Weights   06/26/24 0500 06/27/24 0500 06/29/24 0500  Weight: 49.3 kg 49.5 kg 60.6 kg    Examination:  General exam: Chronic ill frail cachectic, awake alert, oriented to self, mild tachypnea Respiratory system: Scattered rhonchi Cardiovascular system: S1 & S2 heard, RRR.  Abd: nondistended, soft and nontender.Normal bowel sounds heard. Extremities: Edema in lower back, flanks, 2+ lower extremities GU: Foley catheter Skin: No rashes Psychiatry: Flat affect    Data Reviewed:   CBC: Recent Labs  Lab 06/24/24 0417 06/25/24 0712 06/27/24 0524 06/28/24 0643 06/30/24 0500  WBC 8.9 8.1 7.1 9.4 8.1  NEUTROABS 6.5 5.9  --   --   --   HGB 10.6* 10.4* 9.2* 9.6* 9.9*  HCT 31.7* 31.5* 28.5* 29.6* 29.9*  MCV 81.3 82.5 84.6 83.6 83.5  PLT 193 226 206 235 202   Basic Metabolic Panel: Recent Labs  Lab 06/26/24 0658 06/27/24 0524 06/28/24 0643 06/29/24 0424 06/30/24 0500  NA 136 138 138 140 139  K 4.1 3.6 3.7 3.4* 3.9  CL 101 102 101 101 103  CO2 27 28 29 30 28   GLUCOSE 101* 99 100* 94 96  BUN 34* 39* 39* 35* 33*  CREATININE 1.05* 1.18* 1.02* 1.01* 1.03*  CALCIUM  9.7 9.6 9.7 9.4 9.4   GFR: Estimated Creatinine Clearance: 30.7 mL/min (A) (by C-G formula based on SCr of 1.03 mg/dL (H)). Liver Function Tests: Recent Labs  Lab 06/24/24 0417  AST 15  ALT 12  ALKPHOS 60  BILITOT 0.7  PROT 4.0*  ALBUMIN  1.6*   No results for input(s): LIPASE, AMYLASE in the last 168 hours. No results for input(s): AMMONIA in the last 168 hours. Coagulation Profile: No results for input(s): INR, PROTIME in the last 168 hours. Cardiac Enzymes: No results for input(s): CKTOTAL, CKMB, CKMBINDEX, TROPONINI in the last 168 hours. BNP (last 3 results) No results for input(s): PROBNP in the last 8760 hours. HbA1C: No results for input(s): HGBA1C in the last 72 hours. CBG: Recent Labs  Lab 06/24/24 2003 06/24/24 2333 06/25/24 0332 06/28/24 2101  GLUCAP 136* 136* 110* 121*   Lipid Profile: No results for input(s): CHOL, HDL, LDLCALC, TRIG, CHOLHDL, LDLDIRECT in the last 72 hours. Thyroid  Function Tests: No results for input(s): TSH, T4TOTAL, FREET4, T3FREE, THYROIDAB in the last 72 hours. Anemia Panel: No results for input(s): VITAMINB12, FOLATE, FERRITIN, TIBC, IRON, RETICCTPCT in the last 72 hours. Urine analysis:    Component Value Date/Time   COLORURINE YELLOW 06/18/2024 1702   APPEARANCEUR HAZY (A) 06/18/2024 1702   LABSPEC 1.010 06/18/2024 1702   PHURINE 5.0 06/18/2024 1702   GLUCOSEU NEGATIVE 06/18/2024 1702   HGBUR NEGATIVE 06/18/2024 1702  BILIRUBINUR NEGATIVE 06/18/2024 1702   BILIRUBINUR Negative 03/28/2023 1607   KETONESUR NEGATIVE 06/18/2024 1702   PROTEINUR NEGATIVE 06/18/2024 1702   UROBILINOGEN 0.2 03/28/2023 1607   NITRITE NEGATIVE 06/18/2024 1702   LEUKOCYTESUR SMALL (A) 06/18/2024 1702   Sepsis Labs: @LABRCNTIP (procalcitonin:4,lacticidven:4)  ) Recent Results (from the past 240 hours)  Culture, blood (Routine X 2) w Reflex to ID Panel     Status: None   Collection Time: 06/20/24  3:42 PM   Specimen: BLOOD LEFT HAND  Result Value Ref Range Status   Specimen Description BLOOD LEFT HAND  Final   Special Requests   Final    BOTTLES DRAWN AEROBIC AND ANAEROBIC Blood Culture adequate volume   Culture   Final    NO  GROWTH 5 DAYS Performed at [facility], [address] [place], [place] [address]    Report Status 06/25/2024 FINAL  Final  Culture, blood (Routine X 2) w Reflex to ID Panel     Status: None   Collection Time: 06/20/24  3:44 PM   Specimen: BLOOD RIGHT HAND  Result Value Ref Range Status   Specimen Description BLOOD RIGHT HAND  Final   Special Requests   Final    BOTTLES DRAWN AEROBIC ONLY Blood Culture results may not be optimal due to an inadequate volume of blood received in culture bottles   Culture   Final    NO GROWTH 5 DAYS Performed at [facility], [address], [place], [place] [address]    Report Status 06/25/2024 FINAL  Final  Culture, blood (Routine X 2) w Reflex to ID Panel     Status: None (Preliminary result)   Collection Time: 06/26/24  8:17 PM   Specimen: BLOOD  Result Value Ref Range Status   Specimen Description BLOOD SITE NOT SPECIFIED  Final   Special Requests Immunocompromised  Final   Culture   Final    NO GROWTH 4 DAYS Performed at [facility], [address] [place], [place] [address]    Report Status PENDING  Incomplete  Culture, blood (Routine X 2) w Reflex to ID Panel     Status: None (Preliminary result)   Collection Time: 06/26/24  8:20 PM   Specimen: BLOOD  Result Value Ref Range Status   Specimen Description BLOOD SITE NOT SPECIFIED  Final   Special Requests   Final    Immunocompromised BOTTLES DRAWN AEROBIC AND ANAEROBIC Blood Culture adequate volume   Culture   Final    NO GROWTH 4 DAYS Performed at [facility], [address] [place], [place] [address]    Report Status PENDING  Incomplete     Radiology Studies: No results found.    Scheduled Meds:  amiodarone  100 mg Oral Daily   apixaban  2.5 mg Oral BID   Chlorhexidine Gluconate Cloth  6 each Topical Daily   feeding supplement  237 mL Oral BID BM   furosemide   60 mg Intravenous TID   melatonin  3 mg Oral QHS   metoprolol succinate  25 mg Oral  Daily   midodrine  5 mg Oral TID WC   potassium chloride   20 mEq Oral Daily   QUEtiapine  25 mg Oral QHS   senna-docusate  1 tablet Oral BID   Continuous Infusions:  ampicillin-sulbactam (UNASYN) IV 3 g (06/30/24 0931)     LOS: 12 days    Time spent:    Sigurd Pac, MD [facility]  06/30/2024, 11:50 AM

## 2024-06-30 NOTE — Plan of Care (Signed)
  Problem: Coping: Goal: Level of anxiety will decrease Outcome: Progressing   Problem: Pain Managment: Goal: General experience of comfort will improve and/or be controlled Outcome: Progressing   Problem: Activity: Goal: Risk for activity intolerance will decrease Outcome: Not Progressing   Problem: Nutrition: Goal: Adequate nutrition will be maintained Outcome: Not Progressing

## 2024-06-30 NOTE — Progress Notes (Signed)
 Progress Note  Patient Name: DESANI SPRUNG Date of Encounter: 06/30/2024 [facility] Cardiologist: Oneil Parchment, MD   Interval Summary   Has hearing difficulty and difficulty communicating without hearing aids in. After hearing aids placed patient was able to answer questions. Denies chest pain but continues to have shortness of breath.  Vital Signs Vitals:   06/29/24 0925 06/29/24 1728 06/29/24 2053 06/30/24 0603  BP: (!) 104/51 (!) 116/54 (!) 117/58 (!) 107/54  Pulse: 62 61 60 (!) 59  Resp:  15    Temp:  98.2 F (36.8 C) 99.5 F (37.5 C) 97.6 F (36.4 C)  TempSrc:  Oral Oral Oral  SpO2:  94% 94% 96%  Weight:      Height:        Intake/Output Summary (Last 24 hours) at 06/30/2024 0814 Last data filed at 06/30/2024 0600 Gross per 24 hour  Intake 720 ml  Output 1100 ml  Net -380 ml      06/29/2024    5:00 AM 06/27/2024    5:00 AM 06/26/2024    5:00 AM  Last 3 Weights  Weight (lbs) 133 lb 9.6 oz 109 lb 2 oz 108 lb 11 oz  Weight (kg) 60.6 kg 49.5 kg 49.3 kg      Telemetry/ECG  Normal sinus rhythm in the 50's and 60's  - Personally Reviewed  Physical Exam  GEN: No acute distress.  On 2L [place] Neck: positive JVD Cardiac: RRR, no murmurs, rubs, or gallops.  Respiratory: bilateral crackles GI: Soft, nontender, non-distended  MS: No edema  Assessment & Plan   89 y.o. female who is nonambulatory and living in a nursing facility with a mild dementia, history of HFpEF managed with diuretics, TIA, aortic root dilation, mild coronary calcifications on CT, aortoiliac atherosclerotic calcifications, splenomegaly who presented on 10/3 with fever, hypotension, AMS, rash on left hand and septic shock and atrial fibrillation with RVR which developed in the emergency room. Initially treated with IV diltiazem but then developed hypotension. She was then treated with norepinephrine and IV amiodarone with IV fluids. Cardiology consulted for new atrial fibrillation  management.       Moraxella catarrhalis pneumonia Septic shock - resolving Was able to DC levophed. Completed 7 days of IV ceftriaxone, repeat blood cultures are negative  Chest x-ray was concerning for pneumonia and patient was restarted on antibiotics. Continue midodrine 5 mg every 8 hours.      New onset atrial fibrillation/flutter with RVR Hypokalemia CHA2DS2-VASc Score = 7 [CHF History: 1, HTN History: 0, Diabetes History: 0, Stroke History: 2, Vascular Disease History: 1, Age Score: 2, Gender Score: 1].  Therefore, the patient's annual risk of stroke is 11.2 %.    In the setting of pneumonia and septic shock as mentioned above. Remains in sinus rhythm Potassium 3.9. Goal >4. Order 20meq potassium daily while on IV lasix . Continue oral amiodarone 100 mg daily. Continue Eliquis 2.5 mg BID (age, weight). Patient is at an increased fall risk due to frailty but she has an elevated CHADS2-Vasc score, is non ambulatory (elevated DVT risk), and lives in a nursing home so plan to continue on d/c. May consider decreasing metoprolol succinate to 12.5 mg daily.     Acute on chronic HFpEF Hypoalbuminemia Echo on 06/19/24 showed normal LVEF of 50-55%, mild MR, and Severe MAC. Albumin was 1.6.  This is likely contributing to edema. Has received albumin replacement. Was started on 40mg  IV lasix  on 06/25/24 I/O are net out 0.38L over past  day. Weights don't appear to be accurate.  Family reported baseline weight is about 120. creatinine stable at 1.03. Chest x-ray showed increasing bilateral pleural effusions, patchy right lung base opacity, atelectasis versus pneumonia, Redemonstrated opacity in the left perihilar region. Differential considerations include infection or neoplasm, and cardiomegaly with vascular congestion. continue Lasix  to 40 mg twice daily due to good I's and O's over the past day on this dose and concerns of overdiuresis. Continue strict I's and O's. May consider limited echo  to reevaluate LVEF give recent concerns of increased volume.     Otherwise management per primary    For questions or updates, please contact [facility] Please consult [email or web address] for contact info under        Signed, Hipolito Martinezlopez, PA-C

## 2024-07-01 DIAGNOSIS — Z515 Encounter for palliative care: Secondary | ICD-10-CM | POA: Diagnosis not present

## 2024-07-01 DIAGNOSIS — A419 Sepsis, unspecified organism: Secondary | ICD-10-CM | POA: Diagnosis not present

## 2024-07-01 DIAGNOSIS — I4891 Unspecified atrial fibrillation: Secondary | ICD-10-CM | POA: Diagnosis not present

## 2024-07-01 DIAGNOSIS — Z7189 Other specified counseling: Secondary | ICD-10-CM | POA: Diagnosis not present

## 2024-07-01 DIAGNOSIS — R6521 Severe sepsis with septic shock: Secondary | ICD-10-CM | POA: Diagnosis not present

## 2024-07-01 DIAGNOSIS — I959 Hypotension, unspecified: Secondary | ICD-10-CM | POA: Diagnosis not present

## 2024-07-01 DIAGNOSIS — I5030 Unspecified diastolic (congestive) heart failure: Secondary | ICD-10-CM | POA: Diagnosis not present

## 2024-07-01 LAB — CBC
HCT: 29.4 % — ABNORMAL LOW (ref 36.0–46.0)
Hemoglobin: 9.4 g/dL — ABNORMAL LOW (ref 12.0–15.0)
MCH: 27.2 pg (ref 26.0–34.0)
MCHC: 32 g/dL (ref 30.0–36.0)
MCV: 85 fL (ref 80.0–100.0)
Platelets: 211 K/uL (ref 150–400)
RBC: 3.46 MIL/uL — ABNORMAL LOW (ref 3.87–5.11)
RDW: 15.6 % — ABNORMAL HIGH (ref 11.5–15.5)
WBC: 7.9 K/uL (ref 4.0–10.5)
nRBC: 0 % (ref 0.0–0.2)

## 2024-07-01 LAB — BASIC METABOLIC PANEL WITH GFR
Anion gap: 8 (ref 5–15)
BUN: 36 mg/dL — ABNORMAL HIGH (ref 8–23)
CO2: 30 mmol/L (ref 22–32)
Calcium: 9.3 mg/dL (ref 8.9–10.3)
Chloride: 101 mmol/L (ref 98–111)
Creatinine, Ser: 1.01 mg/dL — ABNORMAL HIGH (ref 0.44–1.00)
GFR, Estimated: 53 mL/min — ABNORMAL LOW (ref >60)
Glucose, Bld: 109 mg/dL — ABNORMAL HIGH (ref 70–99)
Potassium: 3.5 mmol/L (ref 3.5–5.1)
Sodium: 139 mmol/L (ref 135–145)

## 2024-07-01 LAB — CULTURE, BLOOD (ROUTINE X 2)
Culture: NO GROWTH
Culture: NO GROWTH

## 2024-07-01 NOTE — Progress Notes (Signed)
 Progress Note   Patient: Briana Fitzgerald FMW:[id number] DOB: 02/10/1932 DOA: 06/18/2024     13 DOS: the patient was seen and examined on 07/01/2024   Brief hospital course:  89 y.o. female with PMH significant for mild dementia, diastolic CHF, TIA. Lives at [facility] assisted living facility, hard of hearing, alert and oriented at baseline. 10/3, patient was brought to the ED with complaint of a rash on her hand and altered mental status.  In the ED she was febrile with septic shock, complicated by A-fib RVR and hypotension Chest x-ray showed bibasilar opacities, left upper lobe opacity. -Treated with pressor support in ICU, Amio gtt. Blood cultures grew Moraxella catarrhalis  While in ICU, patient was delirious and required Precedex drip for about 48 hours.  Was able to come off Levophed in about 72 hours -Transferred from PCCM to TRH service - 10/10, significantly volume overloaded with third spacing, albumin is 1.6, started on IV Lasix  - Overall prognosis is poor, palliative care following - Repeat x-ray 10/13 with worsening pneumonia and CHF, restarted IV ABX -10/13 and 10/14:  Goals of care discussion with patient's spouse and son, plan to medically optimize and then discharged back to ALF with hospice services   Assessment and Plan:   Septic shock POA Multifocal pneumonia Moraxella catarrhalis bacteremia -presented in septic shock due to pneumonia requiring pressors in ICU. Chest x-ray showed multifocal pneumonia Blood cultures-grew Moraxella catarrhalis Completed 7 days of IV ceftriaxone, repeat blood cultures are negative -Continue midodrine - SLP following for dysphagia - Repeat x-ray for tachypnea overnight with worsening infiltrates, suspect ongoing aspiration, restarted Unasyn 10/14, switch to oral ABX when much improved Goals of care was discussed with patient's family given poor prognosis Patient's son and spouse plan  for medical optimization if possible followed  by discharge back to assisted living with hospice services - [facility] consult for hospice   A-fib with RVR New onset A-fib Developed RVR in the setting of septic shock Cards following, started on IV amiodarone, now switched to p.o., metoprolol Continue Eliquis   Acute on chronic diastolic CHF Hypoalbuminemia and third spacing Patient has bilateral lower EXTR edema and low albumin of 1.6. Continue IV Lasix  as recommended by cardiology Patient received IV albumin some days back Continue monitoring input and output Continue daily weight   Severe hypoalbuminemia and malnutrition -Prognosis is poor, palliative care following   Left forearm laceration -Clinically do not suspect shingles   Acute metabolic encephalopathy Underlying mild dementia While in ICU, patient was delirious and required Precedex drip for about 48 hours. Currently on Seroquel 25 mg nightly, melatonin 3 mg nightly   Dysphagia Seen by ST. High risk of aspiration Currently on dysphagia 1 diet   AKI on CKD 2 Baseline creatinine normal. Creatinine elevated in the setting of septic shock Now improving   Recurrent urinary retention - Recurrent retention, Foley catheter replaced on 10/7, now on bethanechol - Anticipate need for Foley at discharge   Hypoalbuminemia Albumin low at 1.6.  Poor nutrition, now with anasarca Dietitian consulted   Restless legs Was on pramipexole  0.5 mg nightly.  Currently on hold     DVT prophylaxis: Apixaban Code Status: DNR Family Communication: Discussed with spouse and son 10/13 and 10/14 Disposition Plan: Back to ALF with hospice   Subjective:  Patient seen and examined at bedside this morning Admits to improvement in respiratory function Still has significant edema in lower extremities Diuresing well  Physical Exam: Vitals:   07/01/24 9462 07/01/24 9081  07/01/24 0919 07/01/24 1011  BP: (!) 105/52 112/60 112/60 (!) 115/57  Pulse: 61 63 63 (!) 59  Resp:   17 18  Temp:  99.2 F (37.3 C)     TempSrc:      SpO2: 98%  95% 96%  Weight:      Height:       General exam: Chronic ill frail cachectic, awake alert, oriented to self, mild tachypnea Respiratory system: Scattered rhonchi Cardiovascular system: S1 & S2 heard, RRR.  Abd: nondistended, soft and nontender.Normal bowel sounds heard. Extremities: Edema in lower back, flanks, 2+ lower extremities GU: Foley catheter Skin: No rashes Psychiatry: Flat affect  Data Reviewed:    Latest Ref Rng & Units 07/01/2024    2:22 AM 06/30/2024    5:00 AM 06/29/2024    4:24 AM  BMP  Glucose 70 - 99 mg/dL 890  96  94   BUN 8 - 23 mg/dL 36  33  35   Creatinine 0.44 - 1.00 mg/dL 8.98  8.96  8.98   Sodium 135 - 145 mmol/L 139  139  140   Potassium 3.5 - 5.1 mmol/L 3.5  3.9  3.4   Chloride 98 - 111 mmol/L 101  103  101   CO2 22 - 32 mmol/L 30  28  30    Calcium  8.9 - 10.3 mg/dL 9.3  9.4  9.4        Latest Ref Rng & Units 07/01/2024    2:22 AM 06/30/2024    5:00 AM 06/28/2024    6:43 AM  CBC  WBC 4.0 - 10.5 K/uL 7.9  8.1  9.4   Hemoglobin 12.0 - 15.0 g/dL 9.4  9.9  9.6   Hematocrit 36.0 - 46.0 % 29.4  29.9  29.6   Platelets 150 - 400 K/uL 211  202  235       Disposition: Status is: Inpatient  Time spent: 42 minutes  Author: Drue ONEIDA Potter, MD 07/01/2024 2:11 PM  For on call review [email or web address].

## 2024-07-01 NOTE — Progress Notes (Signed)
 Progress Note  Patient Name: Briana Fitzgerald Date of Encounter: 07/01/2024 [facility] Cardiologist: Oneil Parchment, MD   Interval Summary   Interview was limited by patient not having hearing aids in.  I searched for the hearing aids and the patient's charger but was unable to find them.   Vital Signs Vitals:   07/01/24 0034 07/01/24 0537 07/01/24 0918 07/01/24 0919  BP: (!) 108/50 (!) 105/52 112/60 112/60  Pulse: 60 61 63 63  Resp:    17  Temp: 98 F (36.7 C) 99.2 F (37.3 C)    TempSrc: Oral     SpO2: 98% 98%  95%  Weight:      Height:        Intake/Output Summary (Last 24 hours) at 07/01/2024 0933 Last data filed at 07/01/2024 9192 Gross per 24 hour  Intake 720 ml  Output 1000 ml  Net -280 ml      06/29/2024    5:00 AM 06/27/2024    5:00 AM 06/26/2024    5:00 AM  Last 3 Weights  Weight (lbs) 133 lb 9.6 oz 109 lb 2 oz 108 lb 11 oz  Weight (kg) 60.6 kg 49.5 kg 49.3 kg      Telemetry/ECG  Normal sinus rhythm with heart rates in the 60s- Personally Reviewed  Physical Exam  GEN: No acute distress.   Neck: Positive JVD Cardiac: RRR, no murmurs, rubs, or gallops.  Respiratory: Clear to auscultation bilaterally. GI: Soft, nontender, non-distended  MS: No edema.  Assessment & Plan  89 y.o. female who is nonambulatory and living in a nursing facility with a mild dementia, history of HFpEF managed with diuretics, TIA, aortic root dilation, mild coronary calcifications on CT, aortoiliac atherosclerotic calcifications, splenomegaly who presented on 10/3 with fever, hypotension, AMS, rash on left hand and septic shock and atrial fibrillation with RVR which developed in the emergency room. Initially treated with IV diltiazem but then developed hypotension. She was then treated with norepinephrine and IV amiodarone with IV fluids. Cardiology consulted for new atrial fibrillation management.       Moraxella catarrhalis pneumonia Septic shock -  resolved Hypotension Was able to DC levophed. Completed 7 days of IV ceftriaxone, repeat blood cultures are negative  Chest x-ray was concerning for pneumonia and patient was restarted on antibiotics. Continue midodrine 5 mg every 8 hours.     Acute on chronic HFpEF Moderate pulmonary hypertension Hypoalbuminemia Echo on 06/19/24 showed normal LVEF of 50-55%, mild MR, and Severe MAC. Albumin was 1.6.  Hypoalbuminemia and failure to thrive likely contributing to edema. Has received albumin replacement. Limited echo on 06/30/24 showed normal LVEF of 60-65% and an IVC pressure of . Creatinine stable at 1.01.  Lasix  management per primary. Potassium management per primary. Recommend goal potassium >4 with atrial fibrillation. Continue strict I's and O's. Has order for daily weights.   New onset atrial fibrillation/flutter with RVR CHA2DS2-VASc Score = 7 [CHF History: 1, HTN History: 0, Diabetes History: 0, Stroke History: 2, Vascular Disease History: 1, Age Score: 2, Gender Score: 1].  Therefore, the patient's annual risk of stroke is 11.2 %.    In the setting of pneumonia and septic shock as mentioned above. Remains in sinus rhythm Potassium 3.9. Goal >4. Order 20meq potassium daily while on IV lasix . Continue oral amiodarone 100 mg daily. Continue Eliquis 2.5 mg BID (age, weight). Patient is at an increased fall risk due to frailty but she has an elevated CHADS2-Vasc score, is non ambulatory (elevated  DVT risk), and lives in a nursing home so plan to continue on d/c. Continue metoprolol 25mg  daily.     Otherwise management per primary  [facility] will sign off.   The patient is ready for discharge today from a cardiac standpoint. Medication Recommendations: As above Other recommendations (labs, testing, etc): May need TSH, LFTs, PFTs due to starting amiodarone. Follow up as an outpatient: Plan for outpatient follow-up in about 2 weeks. For questions or updates, please  contact [facility] Please consult [email or web address] for contact info under        Signed, Joshlynn Alfonzo, PA-C

## 2024-07-01 NOTE — Plan of Care (Signed)
  Problem: Nutrition: Goal: Adequate nutrition will be maintained Outcome: Progressing   Problem: Safety: Goal: Ability to remain free from injury will improve Outcome: Progressing   Problem: Activity: Goal: Risk for activity intolerance will decrease Outcome: Not Progressing   Problem: Coping: Goal: Level of anxiety will decrease Outcome: Not Progressing

## 2024-07-01 NOTE — Plan of Care (Signed)
  Problem: Clinical Measurements: Goal: Ability to maintain clinical measurements within normal limits will improve Outcome: Progressing   Problem: Nutrition: Goal: Adequate nutrition will be maintained Outcome: Progressing   Problem: Coping: Goal: Level of anxiety will decrease Outcome: Progressing   

## 2024-07-01 NOTE — Progress Notes (Signed)
 Daily Progress Note   Date: 07/01/2024   Patient Name: Briana Fitzgerald  DOB: [birth year]  MRN: [id number]  Age / Sex: 89 y.o., female  Attending Physician: Dorinda Drue DASEN, MD Primary Care Physician: Micheal Wolm ORN, MD Admit Date: 06/18/2024 Length of Stay: 13 days  Reason for Follow-up: Establishing goals of care  Past Medical History:  Diagnosis Date   Arthritis    BACK PAIN 07/18/2008   Bowel obstruction (HCC)    CONSTIPATION 10/02/2010   GERD 10/02/2010   Headache    IBS (irritable bowel syndrome)    LAMINECTOMY, LUMBAR, HX OF 06/16/2008   OSTEOARTHRITIS 10/02/2010   Pneumonia    RESTLESS LEG SYNDROME 10/02/2010   Sacral fracture (HCC) 08/23/2022   SACROILIAC JOINT DYSFUNCTION 06/16/2008   Sleep apnea    STYE 10/02/2010   TRANSIENT ISCHEMIC ATTACKS, HX OF 10/02/2010    Subjective:   Subjective: Chart Reviewed. Updates received. Patient Assessed. Created space and opportunity for patient  and family to explore thoughts and feelings regarding current medical situation.  Today's Discussion: Today I received a message via our team phone from the patient's son requesting reinvolvement to help with discharge planning, hospice discussion, etc.  Today before meeting with the patient/family, I reviewed the chart notes including multiple hospitalist notes over the past several days, hospitalist note from yesterday, hospice note from today.  I also reviewed nursing notes from yesterday, cardiology note from yesterday, TOC note from yesterday, cardiology note from today. I also reviewed vital signs, nursing flowsheets, medication administrations record, labs, and imaging. Labs reviewed include CBC with persistent and ongoing normal white count over the past week in the setting of sepsis and septic shock with WBC 7.9 today.  BMP with persistent stable/normal creatinine at 1.01 today over the past week in the setting of bump in creatinine over the past week in the setting of sepsis  and septic shock.  Today saw the patient at bedside, she was resting comfortably in the bed.  She was eating a magic cup dessert and appeared to be enjoying it.  She made eye contact and greeted me warmly when I went to the room.  She denies any pain, nausea, vomiting.  She agrees that she likes the Magic cup.  She confused her telemetry box with her son cell phone, but understood with redirection.  I shared that I had been discussing with her son and will be reaching out to him today to help however he needs and deciding how to proceed at discharge.  She agreed and thanked me for my visit.  She denies any needs at this time.  After seeing the patient I reached out to her son Zachary.  Spent time discussing neck steps and the process moving forward to medically optimize, discharged back to ALF at [facility] and involve hospice at the ALF.  He states that he is communicated with [facility] and they contract with [facility].  I informed him that [facility] coordinates the pieces as far as awaiting formal approval that [facility] can accept her back, notifying [facility] when she is ready for discharge so they can prepare to except her, notifying [facility] about discharge back to [facility] so they can coordinate an admission visit.  I shared that we would also coordinate the discharge to the assisted living facility.  I shared that I would update the medical team and [facility] team about our conversation.  I shared that we would back off as no ongoing  palliative needs but he is free to call for any questions or concerns in the future.  I shared that because goals are clear the palliative medicine team would back off.  However we remain available for any significant clinical change or new palliative needs in the interim.  I provided emotional and general support through therapeutic listening, empathy, sharing of stories, and other techniques. I answered all questions and addressed all concerns to  the best of my ability.  Review of Systems  Constitutional:        Denies pain in general  Respiratory:  Negative for shortness of breath (Notes breathing much improved).   Gastrointestinal:  Negative for abdominal pain, nausea and vomiting.    Objective:   Primary Diagnoses: Present on Admission:  (Resolved) Septic shock (HCC)  Chronic diastolic CHF (congestive heart failure) (HCC)  Aortic root dilatation   Vital Signs:  BP (!) 115/57 (BP Location: Left Arm)   Pulse (!) 59   Temp 99.2 F (37.3 C)   Resp 18   Ht 5' 3.5 (1.613 m)   Wt 60.6 kg   SpO2 96%   BMI 23.29 kg/m   Physical Exam Vitals and nursing note reviewed.  Constitutional:      General: She is not in acute distress.    Appearance: She is ill-appearing.  HENT:     Head: Normocephalic and atraumatic.  Cardiovascular:     Rate and Rhythm: Normal rate.  Pulmonary:     Effort: Pulmonary effort is normal.  Abdominal:     General: Abdomen is flat.  Skin:    General: Skin is warm and dry.  Neurological:     General: No focal deficit present.     Mental Status: She is alert.     Comments: Oriented to person, place, situation; disoriented to time  Psychiatric:        Mood and Affect: Mood normal.        Behavior: Behavior normal.     Palliative Assessment/Data: 50%   Existing Vynca/ACP Documentation: Advance directive signed 06/05/2022  Assessment & Plan:   HPI/Patient Profile:  89 y.o. female  with past medical history of HFpEF, TIA, mild dementia who presented from assisted living with AMS and rash on her hand.  She was admitted on 06/18/2024 with acute hypoxemic respiratory failure, septic shock due to CAP from M. catarrhalis delirium, shingles on the hand, and others.   Palliative medicine was consulted for GOC conversations.  SUMMARY OF RECOMMENDATIONS   DNR-Limited Continue current scope of care Plan to medically optimize and then discharge back to ALF with hospice services in place Goals  are clear Palliative medicine will back off at this time Please notify us  of any significant clinical change or new palliative needs  Symptom Management:  Per primary team Palliative medicine is available to assist as needed  Code Status: DNR - Limited (DNR/DNI)  Prognosis: < 6 months  Discharge Planning: ALF with hospice services in place  Discussed with: Patient, patient's family, medical team, nursing team, [facility] team  Thank you for allowing us  to participate in the care of Priyal H Larusso PMT will continue to support holistically.  Billing based on MDM: Moderate  Detailed review of medical records (labs, imaging, vital signs), medically appropriate exam, discussed with treatment team, counseling and education to patient, family, & staff, documenting clinical information, medication management, coordination of care  Camellia Kays, NP Palliative Medicine Team  Team Phone # [phone] (Nights/Weekends)  05/15/2021, 8:17 AM

## 2024-07-01 NOTE — TOC Progression Note (Addendum)
 Transition of Care [facility]) - Progression Note    Patient Details  Name: Briana Fitzgerald MRN: [id number] Date of Birth: 06-Feb-1932  Transition [facility] CM/SW Contact  Kalani Baray LITTIE [place], [place] Phone Number: 07/01/2024, 3:30 PM  Clinical Narrative:    CSW spoke with Rebekah at [facility], she stated they can admit pt when she is medically ready for DC but they do not admit on the weekends. [facility] also stated if family wants hospice to follow pt at facility they usually prefer [facility]. CSW spoke with pt son, Zachary and updated him on discharge plan, and informed him that Rebekah with [facility] stated they would take pt back at facility following hospital discharge but do not admit on the weekends. CSW and Zachary agreed to touch base again on Monday CSW will continue to follow.    Expected Discharge Plan: Skilled Nursing Facility Barriers to Discharge: Continued Medical Work up, English as a second language teacher, SNF Pending bed offer               Expected Discharge Plan and Services In-house Referral: Clinical Social Work     Living arrangements for the past 2 months: Assisted Living Facility                                       Social Drivers of Health (SDOH) Interventions SDOH Screenings   Food Insecurity: No Food Insecurity (06/23/2024)  Housing: Low Risk  (06/23/2024)  Transportation Needs: No Transportation Needs (06/23/2024)  Utilities: Not At Risk (06/23/2024)  Alcohol Screen: Low Risk  (05/27/2024)  Depression (PHQ2-9): Low Risk  (10/01/2023)  Financial Resource Strain: Low Risk  (05/27/2024)  Physical Activity: Inactive (05/27/2024)  Social Connections: Unknown (06/23/2024)  Recent Concern: Social Connections - Socially Isolated (05/27/2024)  Stress: No Stress Concern Present (05/27/2024)  Tobacco Use: Low Risk  (06/18/2024)    Readmission Risk Interventions     No data to display

## 2024-07-02 DIAGNOSIS — A419 Sepsis, unspecified organism: Secondary | ICD-10-CM | POA: Diagnosis not present

## 2024-07-02 DIAGNOSIS — I4891 Unspecified atrial fibrillation: Secondary | ICD-10-CM | POA: Diagnosis not present

## 2024-07-02 DIAGNOSIS — R6521 Severe sepsis with septic shock: Secondary | ICD-10-CM | POA: Diagnosis not present

## 2024-07-02 DIAGNOSIS — I959 Hypotension, unspecified: Secondary | ICD-10-CM | POA: Diagnosis not present

## 2024-07-02 DIAGNOSIS — I5030 Unspecified diastolic (congestive) heart failure: Secondary | ICD-10-CM | POA: Diagnosis not present

## 2024-07-02 LAB — BASIC METABOLIC PANEL WITH GFR
Anion gap: 9 (ref 5–15)
BUN: 40 mg/dL — ABNORMAL HIGH (ref 8–23)
CO2: 29 mmol/L (ref 22–32)
Calcium: 9.1 mg/dL (ref 8.9–10.3)
Chloride: 100 mmol/L (ref 98–111)
Creatinine, Ser: 1.16 mg/dL — ABNORMAL HIGH (ref 0.44–1.00)
GFR, Estimated: 45 mL/min — ABNORMAL LOW (ref >60)
Glucose, Bld: 103 mg/dL — ABNORMAL HIGH (ref 70–99)
Potassium: 3.6 mmol/L (ref 3.5–5.1)
Sodium: 138 mmol/L (ref 135–145)

## 2024-07-02 LAB — CBC
HCT: 29.5 % — ABNORMAL LOW (ref 36.0–46.0)
Hemoglobin: 9.6 g/dL — ABNORMAL LOW (ref 12.0–15.0)
MCH: 27.2 pg (ref 26.0–34.0)
MCHC: 32.5 g/dL (ref 30.0–36.0)
MCV: 83.6 fL (ref 80.0–100.0)
Platelets: 204 K/uL (ref 150–400)
RBC: 3.53 MIL/uL — ABNORMAL LOW (ref 3.87–5.11)
RDW: 15.6 % — ABNORMAL HIGH (ref 11.5–15.5)
WBC: 8.5 K/uL (ref 4.0–10.5)
nRBC: 0 % (ref 0.0–0.2)

## 2024-07-02 MED ORDER — ADULT MULTIVITAMIN W/MINERALS CH
1.0000 | ORAL_TABLET | Freq: Every day | ORAL | Status: DC
  Administered 2024-07-02 – 2024-07-05 (×4): 1 via ORAL
  Filled 2024-07-02 (×4): qty 1

## 2024-07-02 MED ORDER — BUMETANIDE 0.25 MG/ML IJ SOLN: 1.0000 mg | Freq: Two times a day (BID) | INTRAMUSCULAR | Status: DC

## 2024-07-02 MED ORDER — BUMETANIDE 0.25 MG/ML IJ SOLN
1.0000 mg | Freq: Two times a day (BID) | INTRAMUSCULAR | Status: DC
  Administered 2024-07-02: 1 mg via INTRAVENOUS
  Filled 2024-07-02 (×2): qty 4

## 2024-07-02 MED ORDER — SODIUM CHLORIDE 0.9 % IV SOLN
3.0000 g | Freq: Two times a day (BID) | INTRAVENOUS | Status: DC
  Administered 2024-07-03 – 2024-07-05 (×6): 3 g via INTRAVENOUS
  Filled 2024-07-02 (×6): qty 8

## 2024-07-02 NOTE — Progress Notes (Signed)
  Progress Note  Patient Name: Briana Fitzgerald Date of Encounter: 07/02/2024 [facility] Cardiologist: Oneil Parchment, MD   Interval Summary   Patient resting comfortably in bed this AM. Feels like she has to have a bowel movement. No other complaints. Maintaining NSR per tele   Vital Signs Vitals:   07/01/24 2235 07/02/24 0425 07/02/24 0500 07/02/24 0600  BP:    (!) 114/57  Pulse: 65 66  (!) 59  Resp: 20   20  Temp: 100 F (37.8 C)   99.2 F (37.3 C)  TempSrc: Oral   Oral  SpO2: 91% 92%  94%  Weight:   58.3 kg   Height:        Intake/Output Summary (Last 24 hours) at 07/02/2024 0747 Last data filed at 07/02/2024 0600 Gross per 24 hour  Intake 1560 ml  Output 3000 ml  Net -1440 ml      07/02/2024    5:00 AM 06/29/2024    5:00 AM 06/27/2024    5:00 AM  Last 3 Weights  Weight (lbs) 128 lb 8.5 oz 133 lb 9.6 oz 109 lb 2 oz  Weight (kg) 58.3 kg 60.6 kg 49.5 kg      Telemetry/ECG  NSR, HR in the 60s - Personally Reviewed  Physical Exam  GEN: No acute distress.  Laying flat in the bed  Neck: No JVD Cardiac:  RRR, no murmurs, rubs, or gallops.  Respiratory: Clear to auscultation bilaterally. Normal WOB  GI: Soft, nontender, non-distended  MS: Trace edema in BLE  Assessment & Plan   Atrial Fibrillation  - Had A-fib with RVR in the setting of sepsis and pneumonia - TSH normal in 04/2024.  Echo this admission showed EF 60-85%, no regional wall motion abnormalities, normal RV systolic function - Per telemetry, patient now maintaining normal sinus rhythm with heart rate in the 60s - Continue oral amiodarone 100 mg daily - Continue metoprolol succinate 25 mg daily  - Continue Eliquis 2.5 mg twice daily (dose reduced for age, weight).  CHA2DS2-VASc 7  Acute on Chronic HFpEF - Echo this admission with EF 60 to 65%, no regional wall motion abnormalities, grade 2 diastolic dysfunction, normal RV systolic function, moderately elevated PA systolic pressure.  There  was severe mitral annular calcification without evidence of regurgitation or stenosis.  Aortic sclerosis present with no evidence of aortic stenosis. - Patient currently on IV Lasix  60 mg 3 times daily.  Output 3 L urine yesterday and is currently net -2.9 L since admission. - Creatinine slightly increased today from 1.01> 1.16.  Continues to require 3 liters supplemental oxygen and has trace bilateral lower extremity swelling. Suspect hypoalbuminemia contributing to swelling  - Continue IV lasix  today   Hypotension on Midodrine  - BP stable  - Continue midodrine 5 mg TID  Otherwise per primary  - Septic shock, pneumonia - Severe hypoalbuminemia and malnutrition - Metabolic encephalopathy - Dysphagia - AKI on CKD stage III - Recurrent urinary retention  Goals of care-palliative care had been followed this admission.  Per note yesterday, patient DNR limited.  Continuing current scope of care.  Plan is to medically optimize and dispo discharge back to assisted living facility with hospice services in place.  For questions or updates, please contact [facility] Please consult [email or web address] for contact info under      Signed, Rollo FABIENE Louder, PA-C

## 2024-07-02 NOTE — Plan of Care (Signed)

## 2024-07-02 NOTE — Progress Notes (Addendum)
 Nutrition Follow-up  DOCUMENTATION CODES:   Severe malnutrition in context of chronic illness  INTERVENTION:  Continue dysphagia 1 diet for ease of consuming meals Nursing to assist with meals House trays  Continue Ensure Plus High Protein po BID, each supplement provides 350 kcal and 20 grams of protein. Continue Magic cup TID with meals, each supplement provides 290 kcal and 9 grams of protein MVI with minerals daily   NUTRITION DIAGNOSIS:   Severe Malnutrition related to chronic illness (dementia, CHF) as evidenced by severe fat depletion, severe muscle depletion. - Ongoing   GOAL:   Patient will meet greater than or equal to 90% of their needs - Progressing   MONITOR:   PO intake, Supplement acceptance, Labs, Weight trends  REASON FOR ASSESSMENT:   Consult Assessment of nutrition requirement/status  ASSESSMENT:   89 y.o. female with Hx of HFpEF, non-obstructive CAD, TIA, aortic root dilation, GERD, IBS, who presented with rash and AMS found to have sepsis and new Afib became hypotensive requiring levophed and IVF now off pressors. From ALF.   Palliative care following. Per GOC, plan for for medical optimization if possible followed by discharge back to assisted living with hospice services.   Pt sleeping on visit, no family bedside, per RN slept through breakfast. Review of meal history indicates  pt typically sleeps through breakfast but does well with lunch and dinner. Noted to like Magic cups and per RN is drinking at least 1 Ensure per day. PO intake is fair. Continue with current nutritional interventions. Needs assistance with feeding.  Continues to have volume overload. Echo this admission with EF 60 to 65%,  Patient currently on lasix .   Admit weight: 47 kg Current weight: 58.3 kg - Volume overloaded   Average Meal Intake: 10/7: 50% breakfast, 25% lunch, 25% dinner 10/8: 25% breakfast, 25% lunch, 25% dinner 10/9: 80% lunch  10/10: 15%  breakfast 10/14:25% Lunch, 0% dinner 10/15: 75% Breakfast, 75% Lunch, 75% dinner  10/16: 10% Breakfast, 50% Lunch  Nutritionally Relevant Medications: Scheduled Meds:  feeding supplement  237 mL Oral BID BM   multivitamin with minerals  1 tablet Oral Daily   potassium chloride   20 mEq Oral Daily   QUEtiapine  25 mg Oral QHS   senna-docusate  1 tablet Oral BID   Continuous Infusions:  [START ON 07/03/2024] ampicillin-sulbactam (UNASYN) IV     Labs Reviewed: BUN 40 Creatinine 1.16 GFR 45 CBG ranges from 103-109 mg/dL over the last 24 hours HgbA1c 5.1  Diet Order:   Diet Order             DIET - DYS 1 Room service appropriate? No; Fluid consistency: Thin  Diet effective now                   EDUCATION NEEDS:   No education needs have been identified at this time  Skin:  Skin Assessment: Reviewed RN Assessment  Last BM:  10/16, type 6 x 2  Height:   Ht Readings from Last 1 Encounters:  06/18/24 5' 3.5 (1.613 m)    Weight:   Wt Readings from Last 1 Encounters:  07/02/24 58.3 kg    BMI:  Body mass index is 22.41 kg/m.  Estimated Nutritional Needs:   Kcal:  1300-500 kcal  Protein:  60-80 gm  Fluid:  >1.5L/day   Olivia Kenning, RD Registered Dietitian  See Amion for more information

## 2024-07-02 NOTE — Progress Notes (Signed)
 Physical Therapy Treatment Patient Details Name: Briana Fitzgerald MRN: [id number] DOB: 1932-05-03 Today's Date: 07/02/2024   History of Present Illness Patient is a 89 y/o female admitted 06/18/24 with AMS and rash on her hand.  Found to have Shingles on L hand and became hypotensive and noted A-fib.  Also found to have L sided pneumonia. PMH positive for TIA, CHF, RLS, back pain, OA, sleep apnea, bowel obstruction and mild dementia.    PT Comments  Pt greeted supine in bed, pleasant and agreeable to PT session. She requested to get OOB. Pt with minimal to no active participation in session. She required increased time to follow commands with multi-modal cues. She would stop initiating movement once she felt support of PT and/or Mobility Specialists. Pt was totalA x2 for bed mobility and transfer to recliner chair. She required modA to maintain seated balance EOB d/t posterior and left lateral lean. Repositioned pt in chair so sacrum was offloaded with pillows and trunk was supported in a neutral posture. Will continue to follow acutely and advance appropriately.      If plan is discharge home, recommend the following: Two people to help with bathing/dressing/bathroom;Two people to help with walking and/or transfers;Assistance with cooking/housework;Assist for transportation;Help with stairs or ramp for entrance;Supervision due to cognitive status;Direct supervision/assist for medications management   Can travel by private vehicle     No  Equipment Recommendations  Hospital bed;Hoyer lift;Wheelchair (measurements PT);Wheelchair cushion (measurements PT)    Recommendations for Other Services       Precautions / Restrictions Precautions Precautions: Fall Recall of Precautions/Restrictions: Impaired Restrictions Weight Bearing Restrictions Per Provider Order: No     Mobility  Bed Mobility Overal bed mobility: Needs Assistance Bed Mobility: Supine to Sit     Supine to sit: Total  assist, +2 for physical assistance, HOB elevated     General bed mobility comments: Sat pt up on R side of bed with increased time and use of bed pad to pivot pt to EOB. Mobility Specialists posterior and PT anterior.    Transfers Overall transfer level: Needs assistance Equipment used: 2 person hand held assist Transfers: Sit to/from Stand, Bed to chair/wheelchair/BSC Sit to Stand: Total assist, +2 physical assistance Stand pivot transfers: Total assist, +2 physical assistance         General transfer comment: Pt sat EOB, instructed her to increase B knee flex with minimal adjustment. Anterior knee block provided in caese. Pt held onto PT and Mobility Specialists upper arms. Transferred pt to right with chair touching bed. Repositioned pt in chair using bed pad. Placed pillows underneath either buttock and L-side back/trunk to acheive neutral posture.    Ambulation/Gait                   Stairs             Wheelchair Mobility     Tilt Bed    Modified Rankin (Stroke Patients Only)       Balance Overall balance assessment: Needs assistance Sitting-balance support: Bilateral upper extremity supported, Feet supported Sitting balance-Leahy Scale: Poor Sitting balance - Comments: Pt required modA to maintain seated balance EOB   Standing balance support: Bilateral upper extremity supported, During functional activity Standing balance-Leahy Scale: Zero Standing balance comment: Pt dependent on PT/MS                            Communication Communication Communication: Impaired Factors Affecting Communication: Hearing  impaired  Cognition Arousal: Alert Behavior During Therapy: Flat affect   PT - Cognitive impairments: History of cognitive impairments (Dementia)                         Following commands: Impaired Following commands impaired: Follows one step commands inconsistently    Cueing Cueing Techniques: Verbal cues, Tactile  cues, Gestural cues  Exercises      General Comments General comments (skin integrity, edema, etc.): VSS on 5L O2 via [place]. Maximove lift pad underneath pt in chair.      Pertinent Vitals/Pain Pain Assessment Pain Assessment: PAINAD Breathing: occasional labored breathing, short period of hyperventilation Negative Vocalization: occasional moan/groan, low speech, negative/disapproving quality Facial Expression: sad, frightened, frown Body Language: tense, distressed pacing, fidgeting Consolability: distracted or reassured by voice/touch PAINAD Score: 5 Pain Descriptors / Indicators: Grimacing, Moaning Pain Intervention(s): Monitored during session, Repositioned    Home Living                          Prior Function            PT Goals (current goals can now be found in the care plan section) Acute Rehab PT Goals Patient Stated Goal: Have less pain PT Goal Formulation: Patient unable to participate in goal setting Time For Goal Achievement: 07/06/24 Potential to Achieve Goals: Fair Progress towards PT goals: Not progressing toward goals - comment (Pt with minimal to no active participation allowing therapist to do all the work)    Frequency    Min 1X/week      PT Plan      Co-evaluation              AM-PAC PT 6 Clicks Mobility   Outcome Measure  Help needed turning from your back to your side while in a flat bed without using bedrails?: Total Help needed moving from lying on your back to sitting on the side of a flat bed without using bedrails?: Total Help needed moving to and from a bed to a chair (including a wheelchair)?: Total Help needed standing up from a chair using your arms (e.g., wheelchair or bedside chair)?: Total Help needed to walk in hospital room?: Total Help needed climbing 3-5 steps with a railing? : Total 6 Click Score: 6    End of Session Equipment Utilized During Treatment: Gait belt;Oxygen Activity Tolerance: Patient  limited by fatigue;Patient limited by pain Patient left: in chair;with call bell/phone within reach;with chair alarm set;with family/visitor present Nurse Communication: Mobility status;Need for lift equipment PT Visit Diagnosis: Muscle weakness (generalized) (M62.81);Other abnormalities of gait and mobility (R26.89)     Time: 1548-1600 PT Time Calculation (min) (ACUTE ONLY): 12 min  Charges:    $Therapeutic Activity: 8-22 mins PT General Charges $$ ACUTE PT VISIT: 1 Visit                     Randall SAUNDERS, PT, DPT Acute Rehabilitation Services Office: [phone] Secure Chat Preferred  Briana Fitzgerald 07/02/2024, 4:37 PM

## 2024-07-02 NOTE — Plan of Care (Signed)

## 2024-07-02 NOTE — Progress Notes (Signed)
 Progress Note   Patient: Briana Fitzgerald FMW:[id number] DOB: 1931/11/23 DOA: 06/18/2024     14 DOS: the patient was seen and examined on 07/02/2024   Brief hospital course:  89 y.o. female with PMH significant for mild dementia, diastolic CHF, TIA. Lives at [facility] assisted living facility, hard of hearing, alert and oriented at baseline. 10/3, patient was brought to the ED with complaint of a rash on her hand and altered mental status.  In the ED she was febrile with septic shock, complicated by A-fib RVR and hypotension Chest x-ray showed bibasilar opacities, left upper lobe opacity. -Treated with pressor support in ICU, Amio gtt. Blood cultures grew Moraxella catarrhalis  While in ICU, patient was delirious and required Precedex drip for about 48 hours.  Was able to come off Levophed in about 72 hours -Transferred from PCCM to TRH service - 10/10, significantly volume overloaded with third spacing, albumin is 1.6, started on IV Lasix  - Overall prognosis is poor, palliative care following - Repeat x-ray 10/13 with worsening pneumonia and CHF, restarted IV ABX -10/13 and 10/14:  Goals of care discussion with patient's spouse and son, plan to medically optimize and then discharged back to ALF with hospice services   Assessment and Plan:   Septic shock POA Multifocal pneumonia Moraxella catarrhalis bacteremia Presented in septic shock due to pneumonia requiring pressors in ICU. Chest x-ray showed multifocal pneumonia Blood cultures-grew Moraxella catarrhalis Completed 7 days of IV ceftriaxone, repeat blood cultures are negative --Continue midodrine & wean as ablve --Maintain MAP>65 - SLP following for dysphagia  Aspiration PNA suspected Repeat x-ray for tachypnea overnight (10/13) with worsening infiltrates, suspect ongoing aspiration --restarted Unasyn 10/14 -- continue Goals of care was discussed with patient's family given poor prognosis Patient's son and spouse plan  for  medical optimization if possible followed by discharge back to assisted living with hospice services - [facility] consult for hospice   A-fib with RVR New onset A-fib Developed RVR in the setting of septic shock Cards following, started on IV amiodarone, now switched to p.o., metoprolol Continue Eliquis   Acute on chronic diastolic CHF Hypoalbuminemia and third spacing Patient has bilateral lower EXTR edema and low albumin of 1.6. --Cardiology following --Continue IV Lasix  as recommended by cardiology --Patient received IV albumin some days back --Continue monitoring input and output --Continue daily weight   Severe hypoalbuminemia and malnutrition -Prognosis is poor, palliative care following   Left forearm laceration -Clinically do not suspect shingles   Acute metabolic encephalopathy Underlying mild dementia While in ICU, patient was delirious and required Precedex drip for about 48 hours. Currently on Seroquel 25 mg nightly, melatonin 3 mg nightly   Dysphagia Seen by ST. High risk of aspiration Currently on dysphagia 1 diet   AKI on CKD 2 Baseline creatinine normal. Creatinine elevated in the setting of septic shock Now improving   Recurrent urinary retention - Recurrent retention, Foley catheter replaced on 10/7, now on bethanechol - Anticipate need for Foley at discharge   Hypoalbuminemia Albumin low at 1.6.  Poor nutrition, now with anasarca Dietitian consulted   Restless legs Was on pramipexole  0.5 mg nightly.  Currently on hold     DVT prophylaxis: Apixaban Code Status: DNR Family Communication: Discussed with spouse and son 10/13 and 10/14 Disposition Plan: Back to ALF with hospice   Subjective:  Patient awake sitting up in bed, asking for a drink of water this AM.  No family present at the time of my visit.  Pt  does not answer questions for me, continues to request something to drink.  Denies feeling sick or having pain right now.  No acute events  reported.   Physical Exam: Vitals:   07/02/24 0600 07/02/24 0825 07/02/24 0834 07/02/24 1706  BP: (!) 114/57 (!) 117/54 (!) 117/54 (!) 128/59  Pulse: (!) 59 61 61 61  Resp: 20 18  17   Temp: 99.2 F (37.3 C) 98.2 F (36.8 C)  97.7 F (36.5 C)  TempSrc: Oral Oral    SpO2: 94% 96%  97%  Weight:      Height:       General exam: awake, alert, no acute distress, frail, chronically ill appearing HEENT: dry mucus membranes, hard of hearing  Respiratory system: CTAB, no wheezes, rales or rhonchi, normal respiratory effort. Cardiovascular system: normal S1/S2, RRR, 2+ LE edema.   Gastrointestinal system: soft, NT, ND Central nervous system: A&O x self. normal speech, grossly non-focal but exam limited by dementia Extremities: gauze dressing on right forearm, moves all extremities Skin: dry, intact, normal temperature Psychiatry: normal mood, congruent affect, judgement and insight appear normal   Data Reviewed:    Latest Ref Rng & Units 07/02/2024    2:18 AM 07/01/2024    2:22 AM 06/30/2024    5:00 AM  BMP  Glucose 70 - 99 mg/dL 896  890  96   BUN 8 - 23 mg/dL 40  36  33   Creatinine 0.44 - 1.00 mg/dL 8.83  8.98  8.96   Sodium 135 - 145 mmol/L 138  139  139   Potassium 3.5 - 5.1 mmol/L 3.6  3.5  3.9   Chloride 98 - 111 mmol/L 100  101  103   CO2 22 - 32 mmol/L 29  30  28    Calcium  8.9 - 10.3 mg/dL 9.1  9.3  9.4        Latest Ref Rng & Units 07/02/2024    2:18 AM 07/01/2024    2:22 AM 06/30/2024    5:00 AM  CBC  WBC 4.0 - 10.5 K/uL 8.5  7.9  8.1   Hemoglobin 12.0 - 15.0 g/dL 9.6  9.4  9.9   Hematocrit 36.0 - 46.0 % 29.5  29.4  29.9   Platelets 150 - 400 K/uL 204  211  202       Disposition: Status is: Inpatient  Time spent: 42 minutes  Author: Burnard DELENA Cunning, DO 07/02/2024 6:38 PM  For on call review [email or web address].

## 2024-07-03 DIAGNOSIS — R6521 Severe sepsis with septic shock: Secondary | ICD-10-CM | POA: Diagnosis not present

## 2024-07-03 DIAGNOSIS — A419 Sepsis, unspecified organism: Secondary | ICD-10-CM | POA: Diagnosis not present

## 2024-07-03 LAB — BASIC METABOLIC PANEL WITH GFR
Anion gap: 9 (ref 5–15)
BUN: 42 mg/dL — ABNORMAL HIGH (ref 8–23)
CO2: 29 mmol/L (ref 22–32)
Calcium: 8.9 mg/dL (ref 8.9–10.3)
Chloride: 100 mmol/L (ref 98–111)
Creatinine, Ser: 1.14 mg/dL — ABNORMAL HIGH (ref 0.44–1.00)
GFR, Estimated: 46 mL/min — ABNORMAL LOW (ref >60)
Glucose, Bld: 105 mg/dL — ABNORMAL HIGH (ref 70–99)
Potassium: 3.4 mmol/L — ABNORMAL LOW (ref 3.5–5.1)
Sodium: 138 mmol/L (ref 135–145)

## 2024-07-03 MED ORDER — BUMETANIDE 0.25 MG/ML IJ SOLN
1.0000 mg | Freq: Two times a day (BID) | INTRAMUSCULAR | Status: DC
Start: 2024-07-03 — End: 2024-07-04
  Administered 2024-07-03 (×2): 1 mg via INTRAVENOUS
  Filled 2024-07-03 (×3): qty 4

## 2024-07-03 NOTE — Plan of Care (Signed)

## 2024-07-03 NOTE — Progress Notes (Signed)
 Progress Note   Patient: Briana Fitzgerald FMW:[id number] DOB: 03/21/1932 DOA: 06/18/2024     15 DOS: the patient was seen and examined on 07/03/2024   Brief hospital course:  89 y.o. female with PMH significant for mild dementia, diastolic CHF, TIA. Lives at [facility] assisted living facility, hard of hearing, alert and oriented at baseline. 10/3, patient was brought to the ED with complaint of a rash on her hand and altered mental status.  In the ED she was febrile with septic shock, complicated by A-fib RVR and hypotension Chest x-ray showed bibasilar opacities, left upper lobe opacity. -Treated with pressor support in ICU, Amio gtt. Blood cultures grew Moraxella catarrhalis  While in ICU, patient was delirious and required Precedex drip for about 48 hours.  Was able to come off Levophed in about 72 hours -Transferred from PCCM to [facility] service - 10/10, significantly volume overloaded with third spacing, albumin is 1.6, started on IV Lasix  - Overall prognosis is poor, palliative care following - Repeat x-ray 10/13 with worsening pneumonia and CHF, restarted IV ABX -10/13 and 10/14:  Goals of care discussion with patient's spouse and son, plan to medically optimize and then discharged back to ALF with hospice services 10/17: Cardiology titrating and changing the diuretic medications   Assessment and Plan:   Septic shock POA Multifocal pneumonia Moraxella catarrhalis bacteremia Presented in septic shock due to pneumonia requiring pressors in ICU. Chest x-ray showed multifocal pneumonia Blood cultures-grew Moraxella catarrhalis Completed 7 days of IV ceftriaxone, repeat blood cultures are negative --Continue midodrine & wean as ablve --Maintain MAP>65 - SLP following for dysphagia  Aspiration PNA suspected  tachypnea overnight (10/13) with worsening infiltrates, suspect ongoing aspiration --Unasyn 10/14 -- continue Goals of care was discussed with patient's family given poor  prognosis Patient's son and spouse plan  for medical optimization if possible followed by discharge back to assisted living with hospice services - [facility] consult for hospice   A-fib with RVR New onset A-fib Developed RVR in the setting of septic shock Cards following, started on IV amiodarone, now switched to p.o., metoprolol Continue Eliquis   Acute on chronic diastolic CHF Hypoalbuminemia and third spacing Patient has bilateral lower EXTR edema and low albumin of 1.6. --Cardiology following -- IV diuretics per cardiology --Patient received IV albumin some days back --Continue monitoring input and output --Continue daily weight   Severe hypoalbuminemia and malnutrition -Prognosis is poor, palliative care following   Left forearm laceration -Clinically do not suspect shingles   Acute metabolic encephalopathy Underlying mild dementia While in ICU, patient was delirious and required Precedex drip for about 48 hours. Currently on Seroquel 25 mg nightly, melatonin 3 mg nightly   Dysphagia Seen by ST. High risk of aspiration Currently on dysphagia 1 diet   AKI on CKD 2 Baseline creatinine normal. Creatinine elevated in the setting of septic shock Now improving   Recurrent urinary retention - Recurrent retention, Foley catheter replaced on 10/7, now on bethanechol - Anticipate need for Foley at discharge   Hypoalbuminemia Albumin low at 1.6.  Poor nutrition, now with anasarca Dietitian consulted   Restless legs Was on pramipexole  0.5 mg nightly.  Currently on hold     DVT prophylaxis: Apixaban Code Status: DNR Family Communication: Discussed with spouse and son 10/13 and 10/14 Disposition Plan: Back to ALF with hospice   Subjective:  Patient hard of hearing, resting comfortably   Physical Exam: Vitals:   07/02/24 2106 07/03/24 0535 07/03/24 0846 07/03/24 0931  BP: ROLLEN)  129/58 (!) 106/55 111/60 111/60  Pulse: 60 (!) 56 (!) 55 (!) 55  Resp:  16 15   Temp: (!)  100.4 F (38 C) 97.6 F (36.4 C) 97.8 F (36.6 C)   TempSrc: Oral Oral    SpO2: 96% 97% 96%   Weight:  59.1 kg    Height:        General: Appearance:  Elderly female in no acute distress     Lungs:   On nasal cannula  Heart:    Bradycardic.  Irregular  MS:   All extremities are intact.   Neurologic:   Awake, alert.      Data Reviewed:    Latest Ref Rng & Units 07/03/2024    6:34 AM 07/02/2024    2:18 AM 07/01/2024    2:22 AM  BMP  Glucose 70 - 99 mg/dL 894  896  890   BUN 8 - 23 mg/dL 42  40  36   Creatinine 0.44 - 1.00 mg/dL 8.85  8.83  8.98   Sodium 135 - 145 mmol/L 138  138  139   Potassium 3.5 - 5.1 mmol/L 3.4  3.6  3.5   Chloride 98 - 111 mmol/L 100  100  101   CO2 22 - 32 mmol/L 29  29  30    Calcium  8.9 - 10.3 mg/dL 8.9  9.1  9.3        Latest Ref Rng & Units 07/02/2024    2:18 AM 07/01/2024    2:22 AM 06/30/2024    5:00 AM  CBC  WBC 4.0 - 10.5 K/uL 8.5  7.9  8.1   Hemoglobin 12.0 - 15.0 g/dL 9.6  9.4  9.9   Hematocrit 36.0 - 46.0 % 29.5  29.4  29.9   Platelets 150 - 400 K/uL 204  211  202       Disposition: Status is: Inpatient  Time spent: 41 minutes  Author: Kerra Guilfoil U Arna Luis, DO 07/03/2024 1:29 PM  For on call review [email or web address].

## 2024-07-03 NOTE — Progress Notes (Signed)
 Rounding Note   Patient Name: Briana Fitzgerald Date of Encounter: 07/03/2024  [facility] Cardiologist: Oneil Parchment, MD   Subjective - No acute events overnight - Patient remains SOB - Converted to NSR  Scheduled Meds:  amiodarone  100 mg Oral Daily   apixaban  2.5 mg Oral BID   bumetanide (BUMEX) IV  1 mg Intravenous BID   Chlorhexidine Gluconate Cloth  6 each Topical Daily   feeding supplement  237 mL Oral BID BM   melatonin  3 mg Oral QHS   metoprolol succinate  25 mg Oral Daily   midodrine  5 mg Oral TID WC   multivitamin with minerals  1 tablet Oral Daily   potassium chloride   20 mEq Oral Daily   QUEtiapine  25 mg Oral QHS   senna-docusate  1 tablet Oral BID   Continuous Infusions:  ampicillin-sulbactam (UNASYN) IV 3 g (07/03/24 1424)   PRN Meds: acetaminophen , bisacodyl , mouth rinse, polyethylene glycol   Vital Signs  Vitals:   07/02/24 2106 07/03/24 0535 07/03/24 0846 07/03/24 0931  BP: (!) 129/58 (!) 106/55 111/60 111/60  Pulse: 60 (!) 56 (!) 55 (!) 55  Resp:  16 15   Temp: (!) 100.4 F (38 C) 97.6 F (36.4 C) 97.8 F (36.6 C)   TempSrc: Oral Oral    SpO2: 96% 97% 96%   Weight:  59.1 kg    Height:        Intake/Output Summary (Last 24 hours) at 07/03/2024 1429 Last data filed at 07/03/2024 1244 Gross per 24 hour  Intake 900 ml  Output 1250 ml  Net -350 ml      07/03/2024    5:35 AM 07/02/2024    5:00 AM 06/29/2024    5:00 AM  Last 3 Weights  Weight (lbs) 130 lb 4.7 oz 128 lb 8.5 oz 133 lb 9.6 oz  Weight (kg) 59.1 kg 58.3 kg 60.6 kg      Telemetry Sinus bradycardia- Personally Reviewed  ECG  No new ECG  Physical Exam  GEN: Very frail female laying in bed in NAD Neck: Mildly elevated JVD Cardiac: RRR, no murmurs, rubs, or gallops.  Respiratory: Rales heard bilaterally GI: Soft, nontender, non-distended  MS: No edema; No deformity. Neuro:  Nonfocal  Psych: Normal affect   Labs High Sensitivity Troponin:  No  results for input(s): TROPONINIHS in the last 720 hours.   Chemistry Recent Labs  Lab 07/01/24 0222 07/02/24 0218 07/03/24 0634  NA 139 138 138  K 3.5 3.6 3.4*  CL 101 100 100  CO2 30 29 29   GLUCOSE 109* 103* 105*  BUN 36* 40* 42*  CREATININE 1.01* 1.16* 1.14*  CALCIUM  9.3 9.1 8.9  GFRNONAA 53* 45* 46*  ANIONGAP 8 9 9     Lipids No results for input(s): CHOL, TRIG, HDL, LABVLDL, LDLCALC, CHOLHDL in the last 168 hours.  Hematology Recent Labs  Lab 06/30/24 0500 07/01/24 0222 07/02/24 0218  WBC 8.1 7.9 8.5  RBC 3.58* 3.46* 3.53*  HGB 9.9* 9.4* 9.6*  HCT 29.9* 29.4* 29.5*  MCV 83.5 85.0 83.6  MCH 27.7 27.2 27.2  MCHC 33.1 32.0 32.5  RDW 15.3 15.6* 15.6*  PLT 202 211 204   Thyroid  No results for input(s): TSH, FREET4 in the last 168 hours.  BNPNo results for input(s): BNP, PROBNP in the last 168 hours.  DDimer No results for input(s): DDIMER in the last 168 hours.   Radiology  No results found.  Cardiac Studies  TTE 06/30/24:  IMPRESSIONS     1. Left ventricular ejection fraction, by estimation, is 60 to 65%. The  left ventricle has normal function. The left ventricle has no regional  wall motion abnormalities. Left ventricular diastolic parameters are  consistent with Grade II diastolic  dysfunction (pseudonormalization).   2. Right ventricular systolic function is normal. The right ventricular  size is mildly enlarged. There is moderately elevated pulmonary artery  systolic pressure.   3. Left atrial size was mildly dilated.   4. The mitral valve is normal in structure. No evidence of mitral valve  regurgitation. No evidence of mitral stenosis. Severe mitral annular  calcification.   5. The aortic valve is tricuspid. Aortic valve regurgitation is trivial.  Aortic valve sclerosis is present, with no evidence of aortic valve  stenosis.   6. Aortic dilatation noted. There is mild dilatation of the ascending  aorta, measuring 39 mm.    7. The inferior vena cava is dilated in size with <50% respiratory  variability, suggesting right atrial pressure of 15 mmHg.   Patient Profile   Ms Briana Fitzgerald is a 89 y.o. female with Hx of HFpEF, non-obstructive CAD, TIA, aortic root dilation who presented with sepsis and new Afib with RVR started on dilt gtt and subsequently became hypotensive requiring levophed and IVF now off pressors. Cardiology consulted for Afib management.   Assessment & Plan   #Afib with RVR :: Originally consulted for A-fib with RVR.  The patient has since converted to sinus bradycardia.  Will continue her current regiment for now. - Continue amiodarone 100 mg daily - Continue metoprolol succinate 25 mg daily - Continue Eliquis 2.5 mg twice daily  #Decompensated HFpEF :: A bit volume overloaded in the setting of her pneumonia.  Will continue diuresis. - Continue IV Bumex 1 mg twice daily  #PNA - Antibiotics per primary team        For questions or updates, please contact [facility] Please consult [email or web address] for contact info under       Signed, Georganna Archer, MD  07/03/2024, 2:29 PM

## 2024-07-04 DIAGNOSIS — I4891 Unspecified atrial fibrillation: Secondary | ICD-10-CM | POA: Diagnosis not present

## 2024-07-04 DIAGNOSIS — I5030 Unspecified diastolic (congestive) heart failure: Secondary | ICD-10-CM | POA: Diagnosis not present

## 2024-07-04 DIAGNOSIS — A419 Sepsis, unspecified organism: Secondary | ICD-10-CM | POA: Diagnosis not present

## 2024-07-04 DIAGNOSIS — R6521 Severe sepsis with septic shock: Secondary | ICD-10-CM | POA: Diagnosis not present

## 2024-07-04 LAB — BASIC METABOLIC PANEL WITH GFR
Anion gap: 10 (ref 5–15)
BUN: 42 mg/dL — ABNORMAL HIGH (ref 8–23)
CO2: 28 mmol/L (ref 22–32)
Calcium: 9.1 mg/dL (ref 8.9–10.3)
Chloride: 100 mmol/L (ref 98–111)
Creatinine, Ser: 1.12 mg/dL — ABNORMAL HIGH (ref 0.44–1.00)
GFR, Estimated: 47 mL/min — ABNORMAL LOW (ref >60)
Glucose, Bld: 100 mg/dL — ABNORMAL HIGH (ref 70–99)
Potassium: 3.8 mmol/L (ref 3.5–5.1)
Sodium: 138 mmol/L (ref 135–145)

## 2024-07-04 MED ORDER — FUROSEMIDE 10 MG/ML IJ SOLN
80.0000 mg | Freq: Once | INTRAMUSCULAR | Status: AC
  Administered 2024-07-04: 80 mg via INTRAVENOUS
  Filled 2024-07-04: qty 8

## 2024-07-04 NOTE — Plan of Care (Signed)
   Problem: Education: Goal: Knowledge of General Education information will improve Description: Including pain rating scale, medication(s)/side effects and non-pharmacologic comfort measures Outcome: Not Progressing   Problem: Health Behavior/Discharge Planning: Goal: Ability to manage health-related needs will improve Outcome: Not Progressing   Problem: Clinical Measurements: Goal: Ability to maintain clinical measurements within normal limits will improve Outcome: Progressing

## 2024-07-04 NOTE — Progress Notes (Signed)
 Progress Note   Patient: Briana Fitzgerald FMW:[id number] DOB: Jan 13, 1932 DOA: 06/18/2024     16 DOS: the patient was seen and examined on 07/04/2024   Brief hospital course:  89 y.o. female with PMH significant for mild dementia, diastolic CHF, TIA. Lives at [facility] assisted living facility, hard of hearing, alert and oriented at baseline. 10/3, patient was brought to the ED with complaint of a rash on her hand and altered mental status.  In the ED she was febrile with septic shock, complicated by A-fib RVR and hypotension Chest x-ray showed bibasilar opacities, left upper lobe opacity. -Treated with pressor support in ICU, Amio gtt. Blood cultures grew Moraxella catarrhalis  While in ICU, patient was delirious and required Precedex drip for about 48 hours.  Was able to come off Levophed in about 72 hours -Transferred from PCCM to [facility] service - 10/10, significantly volume overloaded with third spacing, albumin is 1.6, started on IV Lasix  - Overall prognosis is poor, palliative care following - Repeat x-ray 10/13 with worsening pneumonia and CHF, restarted IV ABX -10/13 and 10/14:  Goals of care discussion with patient's spouse and son, plan to medically optimize and then discharged back to ALF with hospice services 10/17: Cardiology titrating and changing the diuretic medications   Assessment and Plan:   Septic shock POA Multifocal pneumonia Moraxella catarrhalis bacteremia Presented in septic shock due to pneumonia requiring pressors in ICU. Chest x-ray showed multifocal pneumonia Blood cultures-grew Moraxella catarrhalis Completed 7 days of IV ceftriaxone, repeat blood cultures are negative --Continue midodrine & wean as ablve --Maintain MAP>65 - SLP following for dysphagia  Aspiration PNA suspected  tachypnea overnight (10/13) with worsening infiltrates, suspect ongoing aspiration --Unasyn 10/14 -- continue Goals of care was discussed with patient's family given poor  prognosis Patient's son and spouse plan  for medical optimization if possible followed by discharge back to assisted living with hospice services - [facility] consult for hospice   A-fib with RVR New onset A-fib Developed RVR in the setting of septic shock Cards following, started on IV amiodarone, now switched to p.o., metoprolol Continue Eliquis   Acute on chronic diastolic CHF Hypoalbuminemia and third spacing Patient has bilateral lower EXTR edema and low albumin of 1.6. --Cardiology following -- IV diuretics per cardiology --Patient received IV albumin some days back --Continue monitoring input and output --Continue daily weight   Severe hypoalbuminemia and malnutrition -Prognosis is poor, palliative care following   Left forearm laceration -Clinically do not suspect shingles   Acute metabolic encephalopathy Underlying mild dementia While in ICU, patient was delirious and required Precedex drip for about 48 hours. Currently on Seroquel 25 mg nightly, melatonin 3 mg nightly   Dysphagia Seen by ST. High risk of aspiration Currently on dysphagia 1 diet   AKI on CKD 2 Baseline creatinine normal. Creatinine elevated in the setting of septic shock Now improving   Recurrent urinary retention - Recurrent retention, Foley catheter replaced on 10/7, now on bethanechol - Anticipate need for Foley at discharge   Hypoalbuminemia Albumin low at 1.6.  Poor nutrition, now with anasarca Dietitian consulted   Restless legs Was on pramipexole  0.5 mg nightly.  Currently on hold     DVT prophylaxis: Apixaban Code Status: DNR Family Communication: Discussed with spouse and son 10/13 and 10/14 Disposition Plan: Back to ALF with hospice   Subjective:  Patient hard of hearing, resting comfortably   Physical Exam: Vitals:   07/04/24 0500 07/04/24 0543 07/04/24 0941 07/04/24 0947  BP:  ROLLEN)  105/49 (!) 112/56 (!) 112/56  Pulse:  (!) 54 (!) 57 (!) 57  Resp:  18  (!) 22  Temp:  97.9  F (36.6 C)  98.4 F (36.9 C)  TempSrc:  Oral  Oral  SpO2:  94%  95%  Weight: 62.8 kg     Height:        General: Appearance:  Early female in no acute distress     Lungs:     Clear to auscultation bilaterally, respirations unlabored  Heart:    Bradycardic.   MS:   All extremities are intact.   Neurologic:   Hard to hearing, repeats when I ask     Data Reviewed:    Latest Ref Rng & Units 07/04/2024    7:55 AM 07/03/2024    6:34 AM 07/02/2024    2:18 AM  BMP  Glucose 70 - 99 mg/dL 899  894  896   BUN 8 - 23 mg/dL 42  42  40   Creatinine 0.44 - 1.00 mg/dL 8.87  8.85  8.83   Sodium 135 - 145 mmol/L 138  138  138   Potassium 3.5 - 5.1 mmol/L 3.8  3.4  3.6   Chloride 98 - 111 mmol/L 100  100  100   CO2 22 - 32 mmol/L 28  29  29    Calcium  8.9 - 10.3 mg/dL 9.1  8.9  9.1        Latest Ref Rng & Units 07/02/2024    2:18 AM 07/01/2024    2:22 AM 06/30/2024    5:00 AM  CBC  WBC 4.0 - 10.5 K/uL 8.5  7.9  8.1   Hemoglobin 12.0 - 15.0 g/dL 9.6  9.4  9.9   Hematocrit 36.0 - 46.0 % 29.5  29.4  29.9   Platelets 150 - 400 K/uL 204  211  202       Disposition: Status is: Inpatient  Time spent: 41 minutes  Author: Shail Urbas U Jeena Arnett, DO 07/04/2024 12:39 PM  For on call review [email or web address].

## 2024-07-04 NOTE — Consult Note (Signed)
 WOC Nurse Consult Note: Reason for Consult: new pressure injuries  Wound type: Stage 2 right upper buttock Deep Tissue Pressure Injury right buttock Deep Tissue Pressure Injury left buttock  Pressure Injury POA: No Measurement: see nursing flow sheet Wound bed:  Right buttock lateral; partial thickness; 100% pink Right buttock medial: dark purple; superficial skin loss  Left buttock; 100% dark purple; intact skin  Drainage (amount, consistency, odor) see nursing flow sheet  Periwound: intact  Dressing procedure/placement/frequency: Cleanse wounds with saline, pat dry  Cover each wound with single layer of xeroform, top with foam. Change daily  Air mattress for moisture management and pressure redistribution    Re consult if needed, will not follow at this time. Thanks  Massie Mees [employer], RN,CWOCN, CNS, [employer] [phone]

## 2024-07-04 NOTE — Progress Notes (Signed)
 Rounding Note   Patient Name: Briana Fitzgerald Date of Encounter: 07/04/2024  [facility] Cardiologist: Briana Parchment, MD   Subjective - No acute events overnight -Patient very tired this morning and not as reactive to questioning  Scheduled Meds:  amiodarone  100 mg Oral Daily   apixaban  2.5 mg Oral BID   Chlorhexidine Gluconate Cloth  6 each Topical Daily   feeding supplement  237 mL Oral BID BM   furosemide   80 mg Intravenous Once   melatonin  3 mg Oral QHS   metoprolol succinate  25 mg Oral Daily   midodrine  5 mg Oral TID WC   multivitamin with minerals  1 tablet Oral Daily   potassium chloride   20 mEq Oral Daily   QUEtiapine  25 mg Oral QHS   senna-docusate  1 tablet Oral BID   Continuous Infusions:  ampicillin-sulbactam (UNASYN) IV 3 g (07/04/24 0136)   PRN Meds: acetaminophen , bisacodyl , mouth rinse, polyethylene glycol   Vital Signs  Vitals:   07/04/24 0500 07/04/24 0543 07/04/24 0941 07/04/24 0947  BP:  (!) 105/49 (!) 112/56 (!) 112/56  Pulse:  (!) 54 (!) 57 (!) 57  Resp:  18  (!) 22  Temp:  97.9 F (36.6 C)  98.4 F (36.9 C)  TempSrc:  Oral  Oral  SpO2:  94%  95%  Weight: 62.8 kg     Height:        Intake/Output Summary (Last 24 hours) at 07/04/2024 1324 Last data filed at 07/04/2024 1255 Gross per 24 hour  Intake 754 ml  Output 1300 ml  Net -546 ml      07/04/2024    5:00 AM 07/03/2024    5:35 AM 07/02/2024    5:00 AM  Last 3 Weights  Weight (lbs) 138 lb 7.2 oz 130 lb 4.7 oz 128 lb 8.5 oz  Weight (kg) 62.8 kg 59.1 kg 58.3 kg      Telemetry Sinus bradycardia- Personally Reviewed  ECG  ECG  Physical Exam  GEN: Laying in bed no acute distress.   Neck: No JVD Cardiac: Bradycardic regular rhythm, no murmurs, rubs, or gallops.  Respiratory: Crackles heard bilaterally GI: Soft, nontender, non-distended  MS: No edema; No deformity. Neuro:  Nonfocal  Psych: Normal affect   Labs High Sensitivity Troponin:  No results  for input(s): TROPONINIHS in the last 720 hours.   Chemistry Recent Labs  Lab 07/02/24 0218 07/03/24 0634 07/04/24 0755  NA 138 138 138  K 3.6 3.4* 3.8  CL 100 100 100  CO2 29 29 28   GLUCOSE 103* 105* 100*  BUN 40* 42* 42*  CREATININE 1.16* 1.14* 1.12*  CALCIUM  9.1 8.9 9.1  GFRNONAA 45* 46* 47*  ANIONGAP 9 9 10     Lipids No results for input(s): CHOL, TRIG, HDL, LABVLDL, LDLCALC, CHOLHDL in the last 168 hours.  Hematology Recent Labs  Lab 06/30/24 0500 07/01/24 0222 07/02/24 0218  WBC 8.1 7.9 8.5  RBC 3.58* 3.46* 3.53*  HGB 9.9* 9.4* 9.6*  HCT 29.9* 29.4* 29.5*  MCV 83.5 85.0 83.6  MCH 27.7 27.2 27.2  MCHC 33.1 32.0 32.5  RDW 15.3 15.6* 15.6*  PLT 202 211 204   Thyroid  No results for input(s): TSH, FREET4 in the last 168 hours.  BNPNo results for input(s): BNP, PROBNP in the last 168 hours.  DDimer No results for input(s): DDIMER in the last 168 hours.   Radiology  No results found.  Cardiac Studies  TTE 06/30/24:  IMPRESSIONS     1. Left ventricular ejection fraction, by estimation, is 60 to 65%. The  left ventricle has normal function. The left ventricle has no regional  wall motion abnormalities. Left ventricular diastolic parameters are  consistent with Grade II diastolic  dysfunction (pseudonormalization).   2. Right ventricular systolic function is normal. The right ventricular  size is mildly enlarged. There is moderately elevated pulmonary artery  systolic pressure.   3. Left atrial size was mildly dilated.   4. The mitral valve is normal in structure. No evidence of mitral valve  regurgitation. No evidence of mitral stenosis. Severe mitral annular  calcification.   5. The aortic valve is tricuspid. Aortic valve regurgitation is trivial.  Aortic valve sclerosis is present, with no evidence of aortic valve  stenosis.   6. Aortic dilatation noted. There is mild dilatation of the ascending  aorta, measuring 39 mm.   7.  The inferior vena cava is dilated in size with <50% respiratory  variability, suggesting right atrial pressure of 15 mmHg.    Patient Profile   [name] is a 89 y.o. female with Hx of HFpEF, non-obstructive CAD, TIA, aortic root dilation who presented with sepsis and new Afib with RVR started on dilt gtt and subsequently became hypotensive requiring levophed and IVF now off pressors. Cardiology consulted for Afib management.   Assessment & Plan    #Afib with RVR now resolved :: Originally consulted for A-fib with RVR.  The patient has since converted to sinus bradycardia.  She is tolerating her current regiment so we will continue. - Continue amiodarone 100 mg daily - Continue metoprolol succinate 25 mg daily - Continue Eliquis 2.5 mg twice daily   #Decompensated HFpEF :: A bit volume overloaded in the setting of her pneumonia.  Not sure how much of her lung issue is pneumonia versus extra fluid.  She has not had great diuresis with Bumex so we will transition back to Lasix . - Give 80 mg IV Lasix  x 1 today   #PNA - Antibiotics per primary team       For questions or updates, please contact [facility] Please consult [email or web address] for contact info under       Signed, Briana Archer, MD  07/04/2024, 1:24 PM

## 2024-07-05 ENCOUNTER — Other Ambulatory Visit (HOSPITAL_COMMUNITY): Payer: Self-pay

## 2024-07-05 DIAGNOSIS — R6521 Severe sepsis with septic shock: Secondary | ICD-10-CM | POA: Diagnosis not present

## 2024-07-05 DIAGNOSIS — A419 Sepsis, unspecified organism: Secondary | ICD-10-CM | POA: Diagnosis not present

## 2024-07-05 LAB — BASIC METABOLIC PANEL WITH GFR
Anion gap: 7 (ref 5–15)
BUN: 44 mg/dL — ABNORMAL HIGH (ref 8–23)
CO2: 30 mmol/L (ref 22–32)
Calcium: 8.9 mg/dL (ref 8.9–10.3)
Chloride: 99 mmol/L (ref 98–111)
Creatinine, Ser: 1.15 mg/dL — ABNORMAL HIGH (ref 0.44–1.00)
GFR, Estimated: 45 mL/min — ABNORMAL LOW (ref >60)
Glucose, Bld: 90 mg/dL (ref 70–99)
Potassium: 3.7 mmol/L (ref 3.5–5.1)
Sodium: 136 mmol/L (ref 135–145)

## 2024-07-05 MED ORDER — QUETIAPINE FUMARATE 25 MG PO TABS
25.0000 mg | ORAL_TABLET | Freq: Every day | ORAL | 0 refills | Status: DC
  Filled 2024-07-05: qty 30, 30d supply, fill #0

## 2024-07-05 MED ORDER — METOPROLOL SUCCINATE ER 25 MG PO TB24
25.0000 mg | ORAL_TABLET | Freq: Every day | ORAL | 0 refills | Status: DC
  Filled 2024-07-05: qty 30, 30d supply, fill #0

## 2024-07-05 MED ORDER — APIXABAN 2.5 MG PO TABS
2.5000 mg | ORAL_TABLET | Freq: Two times a day (BID) | ORAL | 0 refills | Status: DC
  Filled 2024-07-05: qty 60, 30d supply, fill #0

## 2024-07-05 MED ORDER — METOPROLOL SUCCINATE ER 25 MG PO TB24: 12.5000 mg | ORAL_TABLET | Freq: Every day | ORAL | Status: DC

## 2024-07-05 MED ORDER — TORSEMIDE 20 MG PO TABS: 40.0000 mg | ORAL_TABLET | Freq: Two times a day (BID) | ORAL | Status: DC

## 2024-07-05 MED ORDER — MIDODRINE HCL 5 MG PO TABS
5.0000 mg | ORAL_TABLET | Freq: Three times a day (TID) | ORAL | 0 refills | Status: DC
  Filled 2024-07-05: qty 90, 30d supply, fill #0

## 2024-07-05 MED ORDER — METOPROLOL SUCCINATE ER 25 MG PO TB24: 25.0000 mg | ORAL_TABLET | Freq: Every day | ORAL | Status: DC

## 2024-07-05 MED ORDER — BISACODYL 5 MG PO TBEC: 5.0000 mg | DELAYED_RELEASE_TABLET | Freq: Every day | ORAL | Status: DC | PRN

## 2024-07-05 MED ORDER — TORSEMIDE 20 MG PO TABS
40.0000 mg | ORAL_TABLET | Freq: Two times a day (BID) | ORAL | 0 refills | Status: DC
  Filled 2024-07-05: qty 120, 30d supply, fill #0

## 2024-07-05 MED ORDER — CEFADROXIL 500 MG PO CAPS
500.0000 mg | ORAL_CAPSULE | Freq: Two times a day (BID) | ORAL | 0 refills | Status: DC
  Filled 2024-07-05: qty 6, 3d supply, fill #0

## 2024-07-05 MED ORDER — AMIODARONE HCL 200 MG PO TABS
100.0000 mg | ORAL_TABLET | Freq: Every day | ORAL | 0 refills | Status: DC
  Filled 2024-07-05: qty 15, 30d supply, fill #0

## 2024-07-05 NOTE — Progress Notes (Signed)
 [facility] [address] [facility] Liaison Note  Received request from [facility] for hospice services at [facility] after discharge. Spoke with patient's son, Zachary to initiate education related to hospice philosophy, services and team approach to care. Son verbalized understanding of information given. Per discussion, the plan is for discharge home today once DME is in place.   DME needs discussed. Patient has the following equipment in the home: walker, rollator. Family requests the following equipment for delivery: hospital bed, oxygen.   Please send signed and completed DNR home with patient/family. Please provide prescriptions at discharge as needed to ensure ongoing symptom management.  [facility] information and contact numbers given to Aubrey. Please call with any concerns.  Thank you for the opportunity to participate in this patient's care.   Eleanor Nail, LPN [facility] Liaison [phone]

## 2024-07-05 NOTE — NC FL2 (Signed)
 [place]  MEDICAID FL2 LEVEL OF CARE FORM     IDENTIFICATION  Patient Name: Briana Fitzgerald Birthdate: [birth year] Sex: female Admission Date (Current Location): 06/18/2024  [facility] and [place] Number:  Producer, television/film/video and Address:  [facility], [address] [place], [place] [address]      Provider Number: [id number]  Attending Physician Name and Address:  Juvenal Harlene PENNER, DO  Relative Name and Phone Number:  Kiana, Hollar (Son)  [phone] [facility])    Current Level of Care: Hospital Recommended Level of Care: Assisted Living Facility (ALF with [facility]) Prior Approval Number:    Date Approved/Denied:   PASRR Number: [id number]  Discharge Plan: SNF    Current Diagnoses: Patient Active Problem List   Diagnosis Date Noted   Protein-calorie malnutrition, severe 06/26/2024   Atrial fibrillation with rapid ventricular response (HCC) 06/23/2024   Moraxella catarrhalis pneumonia (HCC) 06/23/2024   DNR (do not resuscitate)/DNI(Do Not Intubate) 06/23/2024   Esophageal dysphagia 06/23/2024   Lumbar trigger point syndrome 09/23/2019   Coronary artery calcification seen on CT scan 09/23/2019   Aortic root dilatation 09/23/2019   Degenerative disc disease, cervical 08/20/2019   Chronic diastolic CHF (congestive heart failure) (HCC) 05/06/2018   Obstructive sleep apnea 04/13/2014   History of migraine headaches 01/12/2013   Hyperlipidemia 09/02/2011   Osteoporosis 05/01/2011   Nonallopathic lesion of sacral region 04/17/2011   RESTLESS LEG SYNDROME 10/02/2010   GERD 10/02/2010   Osteoarthritis 10/02/2010   UNEQUAL LEG LENGTH 06/16/2008   Idiopathic scoliosis and kyphoscoliosis 06/16/2008    Orientation RESPIRATION BLADDER Height & Weight     Self  O2 (2L) Incontinent, Indwelling catheter Weight: 132 lb (59.9 kg) Height:  5' 3.5 (161.3 cm)  BEHAVIORAL SYMPTOMS/MOOD NEUROLOGICAL BOWEL NUTRITION STATUS      Incontinent Diet  (dys 1)  AMBULATORY STATUS COMMUNICATION OF NEEDS Skin   Total Care Verbally Other (Comment) (wound R anterior elbow - gauze; L pretibia - ulcer; wound R arm - gauze; R sacrum deep tissue pressure injury - foam;)                       Personal Care Assistance Level of Assistance  Bathing, Feeding, Dressing Bathing Assistance: Maximum assistance Feeding assistance: Maximum assistance Dressing Assistance: Maximum assistance     Functional Limitations Info  Sight, Hearing, Speech Sight Info: Impaired Hearing Info: Impaired Speech Info: Adequate    SPECIAL CARE FACTORS FREQUENCY   (hospice)     PT Frequency: 5x/wk OT Frequency: 5x/wk            Contractures Contractures Info: Not present    Additional Factors Info  Code Status, Allergies Code Status Info: Limited: Do not attempt resuscitation (DNR) -DNR-LIMITED -Do Not Intubate/DNI Allergies Info: Penicillins           TAKE these medications     acetaminophen  500 MG tablet Commonly known as: TYLENOL  Take 500 mg by mouth every 6 (six) hours as needed.    AMBULATORY NON FORMULARY MEDICATION Rolling walker disp 1    amiodarone 200 MG tablet Commonly known as: PACERONE Take 0.5 tablets (100 mg total) by mouth daily. Start taking on: July 06, 2024    apixaban 2.5 MG Tabs tablet Commonly known as: ELIQUIS Take 1 tablet (2.5 mg total) by mouth 2 (two) times daily.    bisacodyl  5 MG EC tablet Commonly known as: DULCOLAX Take 1 tablet (5 mg total) by mouth daily as needed for  severe constipation.    cefadroxil 500 MG capsule Commonly known as: DURICEF Take 1 capsule (500 mg total) by mouth 2 (two) times daily.    metoprolol succinate 25 MG 24 hr tablet Commonly known as: TOPROL-XL Take 1 tablet (25 mg total) by mouth daily. Start taking on: July 06, 2024    midodrine 5 MG tablet Commonly known as: PROAMATINE Take 1 tablet (5 mg total) by mouth 3 (three) times daily with meals.    potassium  chloride SA 20 MEQ tablet Commonly known as: KLOR-CON  M TAKE 1 TABLET BY MOUTH EVERY DAY What changed: when to take this    pramipexole  0.5 MG tablet Commonly known as: MIRAPEX  TAKE 1 TABLET BY MOUTH AT BEDTIME AS NEEDED.AS NEEDED FOR RESTLESS LEGS. What changed: See the new instructions.    QUEtiapine 25 MG tablet Commonly known as: SEROQUEL Take 1 tablet (25 mg total) by mouth at bedtime.    torsemide  20 MG tablet Commonly known as: DEMADEX  Take 2 tablets (40 mg total) by mouth 2 (two) times daily. What changed:  how much to take when to take this    Relevant Imaging Results:  Relevant Lab Results:   Additional Information SSN - [id number]  Malaki Koury E Willodean Leven, LCSW

## 2024-07-05 NOTE — Progress Notes (Signed)
   07/05/24 0812  Vitals  BP (!) 99/46  BP Location Left Arm  BP Method Automatic  Patient Position (if appropriate) Lying  Pulse Rate Source Dinamap  ECG Heart Rate (!) 59  Resp 17  Level of Consciousness  Level of Consciousness Alert  MEWS COLOR  MEWS Score Color Green  Oxygen Therapy  SpO2 94 %  O2 Device Nasal Cannula  O2 Flow Rate (L/min) 2 L/min  Pain Assessment  Pain Scale Faces  Pain Score 0  MEWS Score  MEWS Temp 0  MEWS Systolic 1  MEWS Pulse 0  MEWS RR 0  MEWS LOC 0  MEWS Score 1

## 2024-07-05 NOTE — TOC Transition Note (Addendum)
 Transition of Care [facility]) - Discharge Note   Patient Details  Name: Briana Fitzgerald MRN: [id number] Date of Birth: 05-03-1932  [facility]) CM/SW Contact:  Teigan Sahli E Janiel Derhammer, LCSW Phone Number: 07/05/2024, 3:45 PM   Clinical Narrative:    Discharge to [facility] with [facility] today.  Confirmed with Admissions Worker Pleasant and patient's son Zachary. Per Pleasant armin Zachary- the DME is anticipated to be delivered by 5 PM today. Rebekah states [facility] can be arranged for 5PM pick up. FL2 with DC med list and DC Summary has been faxed to Rebekah. EMS paperwork completed. [facility] called for transport - patient added to list for 5PM pick up. Care Team notified.     Final next level of care: Assisted Living Barriers to Discharge: Barriers Resolved   Patient Goals and CMS Choice Patient states their goals for this hospitalization and ongoing recovery are:: SNF CMS Medicare.gov Compare Post Acute Care list provided to:: Patient Represenative (must comment) Choice offered to / list presented to : Adult Children      Discharge Placement              Patient chooses bed at: Other - please specify in the comment section below: [facility]) Patient to be transferred to facility by: [facility] Name of family member notified: Zachary - son Patient and family notified of of transfer: 07/05/24  Discharge Plan and Services Additional resources added to the After Visit Summary for   In-house Referral: Clinical Social Work              DME Arranged: Oxygen, Hospital bed   Date DME Agency Contacted: 07/05/24   Representative spoke with at DME Agency: [facility] already ordered            Social Drivers of Health (SDOH) Interventions SDOH Screenings   Food Insecurity: No Food Insecurity (06/23/2024)  Housing: Low Risk  (06/23/2024)  Transportation Needs: No Transportation Needs (06/23/2024)  Utilities: Not At Risk (06/23/2024)  Alcohol Screen: Low Risk   (05/27/2024)  Depression (PHQ2-9): Low Risk  (10/01/2023)  Financial Resource Strain: Low Risk  (05/27/2024)  Physical Activity: Inactive (05/27/2024)  Social Connections: Moderately Isolated (07/05/2024)  Stress: No Stress Concern Present (05/27/2024)  Tobacco Use: Low Risk  (06/18/2024)     Readmission Risk Interventions     No data to display

## 2024-07-05 NOTE — Progress Notes (Signed)
 Rounding Note   Patient Name: Briana Fitzgerald Date of Encounter: 07/05/2024  [facility] Cardiologist: Oneil Parchment, MD   Subjective - No acute events overnight - The patient appears more alert today and says that her breathing is doing well.  Denies chest pain and SOB. - Diuresed net -1.4 L - No other complaints  Scheduled Meds:  amiodarone  100 mg Oral Daily   apixaban  2.5 mg Oral BID   Chlorhexidine Gluconate Cloth  6 each Topical Daily   feeding supplement  237 mL Oral BID BM   melatonin  3 mg Oral QHS   metoprolol succinate  12.5 mg Oral Daily   midodrine  5 mg Oral TID WC   multivitamin with minerals  1 tablet Oral Daily   potassium chloride   20 mEq Oral Daily   QUEtiapine  25 mg Oral QHS   senna-docusate  1 tablet Oral BID   torsemide   40 mg Oral BID   Continuous Infusions:  ampicillin-sulbactam (UNASYN) IV 3 g (07/05/24 0235)   PRN Meds: acetaminophen , bisacodyl , mouth rinse, polyethylene glycol   Vital Signs  Vitals:   07/05/24 0623 07/05/24 0641 07/05/24 0812 07/05/24 0816  BP: (!) 91/42 (!) 96/53 (!) 99/46 (!) 99/46  Pulse: (!) 54 (!) 56  (!) 59  Resp: 18  17   Temp: 98.1 F (36.7 C)     TempSrc: Axillary     SpO2: 93%  94%   Weight:      Height:        Intake/Output Summary (Last 24 hours) at 07/05/2024 0931 Last data filed at 07/05/2024 9371 Gross per 24 hour  Intake 220 ml  Output 1750 ml  Net -1530 ml      07/05/2024    5:00 AM 07/04/2024    5:00 AM 07/03/2024    5:35 AM  Last 3 Weights  Weight (lbs) 132 lb 138 lb 7.2 oz 130 lb 4.7 oz  Weight (kg) 59.875 kg 62.8 kg 59.1 kg      Telemetry Sinus bradycardia - Personally Reviewed  ECG  No new ECG  Physical Exam  GEN: No acute distress, resting in bed.   Neck: JVD mildly elevated 2 cm above clavicle Cardiac: Bradycardic with regular rhythm, no murmurs, rubs, or gallops.  Respiratory: Improved bibasilar rales, no wheezes or rhonchi. GI: Soft, nontender,  non-distended  MS: 2+ pitting edema bilaterally; No deformity. Neuro:  Nonfocal  Psych: Normal affect   Labs High Sensitivity Troponin:  No results for input(s): TROPONINIHS in the last 720 hours.   Chemistry Recent Labs  Lab 07/03/24 0634 07/04/24 0755 07/05/24 0724  NA 138 138 136  K 3.4* 3.8 3.7  CL 100 100 99  CO2 29 28 30   GLUCOSE 105* 100* 90  BUN 42* 42* 44*  CREATININE 1.14* 1.12* 1.15*  CALCIUM  8.9 9.1 8.9  GFRNONAA 46* 47* 45*  ANIONGAP 9 10 7     Lipids No results for input(s): CHOL, TRIG, HDL, LABVLDL, LDLCALC, CHOLHDL in the last 168 hours.  Hematology Recent Labs  Lab 06/30/24 0500 07/01/24 0222 07/02/24 0218  WBC 8.1 7.9 8.5  RBC 3.58* 3.46* 3.53*  HGB 9.9* 9.4* 9.6*  HCT 29.9* 29.4* 29.5*  MCV 83.5 85.0 83.6  MCH 27.7 27.2 27.2  MCHC 33.1 32.0 32.5  RDW 15.3 15.6* 15.6*  PLT 202 211 204   Thyroid  No results for input(s): TSH, FREET4 in the last 168 hours.  BNPNo results for input(s): BNP, PROBNP in the  last 168 hours.  DDimer No results for input(s): DDIMER in the last 168 hours.   Radiology  No results found.  Cardiac Studies  TTE 06/30/24:   IMPRESSIONS     1. Left ventricular ejection fraction, by estimation, is 60 to 65%. The  left ventricle has normal function. The left ventricle has no regional  wall motion abnormalities. Left ventricular diastolic parameters are  consistent with Grade II diastolic  dysfunction (pseudonormalization).   2. Right ventricular systolic function is normal. The right ventricular  size is mildly enlarged. There is moderately elevated pulmonary artery  systolic pressure.   3. Left atrial size was mildly dilated.   4. The mitral valve is normal in structure. No evidence of mitral valve  regurgitation. No evidence of mitral stenosis. Severe mitral annular  calcification.   5. The aortic valve is tricuspid. Aortic valve regurgitation is trivial.  Aortic valve sclerosis is present,  with no evidence of aortic valve  stenosis.   6. Aortic dilatation noted. There is mild dilatation of the ascending  aorta, measuring 39 mm.   7. The inferior vena cava is dilated in size with <50% respiratory  variability, suggesting right atrial pressure of 15 mmHg.   Patient Profile   Ms Briana Fitzgerald is a 89 y.o. female with Hx of HFpEF, non-obstructive CAD, TIA, aortic root dilation who presented with sepsis and new Afib with RVR started on dilt gtt and subsequently became hypotensive requiring levophed and IVF now off pressors. Cardiology consulted for Afib management.   Assessment & Plan   #Afib with RVR now resolved :: The patient remained stable in sinus bradycardia.  No more A-fib since her initial RVR.  Her current regiment seems to be working well for her so no changes. - Continue amiodarone 100 mg daily - Continue metoprolol succinate 25 mg daily - Continue Eliquis 2.5 mg twice daily   #Acute on Chronic HFpEF #Volume Overload :: Her respiratory status seems to have improved with the IV Lasix  yesterday.  Her lungs still have some residual crackles which may simply be a consequence of the underlying pneumonia.  She still has 2+ pitting edema in her lower extremities; however, the patient has marked hypoalbuminemia which is likely causing some third spacing.  I think she is fine to transition to oral diuretics today and I will double her previous home dose to continue ongoing diuresis. - Start torsemide  40 mg twice daily   #PNA - Antibiotics per primary team      [facility] will sign off.   The patient is ready for discharge today from a cardiac standpoint. Medication Recommendations: Amiodarone 100 mg daily, metoprolol succinate 25 mg daily, Eliquis 2.5 mg twice daily, torsemide  40 mg twice daily Other recommendations (labs, testing, etc): BMP for electrolyte check as an outpatient within 1 week Follow up as an outpatient: Cardiology follow-up being made For  questions or updates, please contact [facility] Please consult [email or web address] for contact info under       Signed, Georganna Archer, MD  07/05/2024, 9:31 AM

## 2024-07-05 NOTE — Progress Notes (Signed)
 Pt discharged to facility via [facility] with all belongings and paperwork. Foley to remain in at discharge per Vann,DO

## 2024-07-05 NOTE — Plan of Care (Signed)
   Problem: Education: Goal: Knowledge of General Education information will improve Description: Including pain rating scale, medication(s)/side effects and non-pharmacologic comfort measures Outcome: Not Progressing   Problem: Health Behavior/Discharge Planning: Goal: Ability to manage health-related needs will improve Outcome: Not Progressing   Problem: Clinical Measurements: Goal: Ability to maintain clinical measurements within normal limits will improve Outcome: Progressing

## 2024-07-05 NOTE — TOC Progression Note (Signed)
 Transition of Care [facility]) - Progression Note    Patient Details  Name: Briana Fitzgerald MRN: [id number] Date of Birth: Apr 30, 1932  [facility]) CM/SW Contact  Keven Soucy E Cola Highfill, LCSW Phone Number: 07/05/2024, 1:22 PM  Clinical Narrative:    CSW covering patient as of 1 PM. Noted plan for DC to [facility].  [facility] Rep Melissa confirms they have spoken with family and are ordering hospital bed and o2 to be delivered to the ALF. [facility] Rep Pleasant confirmed patient can come back pending DME delivery - Rebekah to call CSW when DME is delivered.    Expected Discharge Plan: Skilled Nursing Facility Barriers to Discharge: Continued Medical Work up, English as a second language teacher, SNF Pending bed offer               Expected Discharge Plan and Services In-house Referral: Clinical Social Work     Living arrangements for the past 2 months: Assisted Living Facility Expected Discharge Date: 07/05/24                                     Social Drivers of Health (SDOH) Interventions SDOH Screenings   Food Insecurity: No Food Insecurity (06/23/2024)  Housing: Low Risk  (06/23/2024)  Transportation Needs: No Transportation Needs (06/23/2024)  Utilities: Not At Risk (06/23/2024)  Alcohol Screen: Low Risk  (05/27/2024)  Depression (PHQ2-9): Low Risk  (10/01/2023)  Financial Resource Strain: Low Risk  (05/27/2024)  Physical Activity: Inactive (05/27/2024)  Social Connections: Moderately Isolated (07/05/2024)  Stress: No Stress Concern Present (05/27/2024)  Tobacco Use: Low Risk  (06/18/2024)    Readmission Risk Interventions     No data to display

## 2024-07-05 NOTE — Discharge Summary (Signed)
 Physician Discharge Summary  Briana Fitzgerald FMW:[id number] DOB: 1932-07-10 DOA: 06/18/2024  PCP: Micheal Wolm ORN, MD  Admit date: 06/18/2024 Discharge date: 07/05/2024  Admitted From:  Discharge disposition: ALF with hospice   Recommendations for Outpatient Follow-Up:   Hospice Dysphagia 1 diet Antibiotics with stop date on prescription Routine Foley care per hospice O2 per hospice  Discharge Diagnosis:   Active Problems:   Moraxella catarrhalis pneumonia (HCC)   Atrial fibrillation with rapid ventricular response (HCC)   Esophageal dysphagia   Chronic diastolic CHF (congestive heart failure) (HCC)   Aortic root dilatation   DNR (do not resuscitate)/DNI(Do Not Intubate)   Protein-calorie malnutrition, severe    Discharge Condition: Improved.  Diet recommendation: Dysphagia 1  Wound care: See below  Code status: DNR   History of Present Illness:   89 y.o. female with PMH significant for mild dementia, diastolic CHF, TIA. Lives at [facility] assisted living facility, hard of hearing, alert and oriented at baseline. 10/3, patient was brought to the ED with complaint of a rash on her hand and altered mental status.  In the ED she was febrile with septic shock, complicated by A-fib RVR and hypotension Chest x-ray showed bibasilar opacities, left upper lobe opacity. -Treated with pressor support in ICU, Amio gtt. Blood cultures grew Moraxella catarrhalis  While in ICU, patient was delirious and required Precedex drip for about 48 hours.  Was able to come off Levophed in about 72 hours -Transferred from PCCM to TRH service - 10/10, significantly volume overloaded with third spacing, albumin is 1.6, started on IV Lasix  - Overall prognosis is poor, palliative care following - Repeat x-ray 10/13 with worsening pneumonia and CHF, restarted IV ABX -10/13 and 10/14:  Goals of care discussion with patient's spouse and son, plan to medically optimize and then  discharged back to ALF with hospice services 10/17: Cardiology titrating and changing the diuretic medications   Hospital Course by Problem:   Septic shock POA Multifocal pneumonia Moraxella catarrhalis bacteremia Presented in septic shock due to pneumonia requiring pressors in ICU. Chest x-ray showed multifocal pneumonia Blood cultures-grew Moraxella catarrhalis Completed 7 days of IV ceftriaxone, repeat blood cultures are negative --Continue midodrine-titrate per hospice   Aspiration PNA suspected  tachypnea overnight (10/13) with worsening infiltrates, suspect ongoing aspiration --Unasyn 10/14 --changed to p.o. antibiotics to finish course Goals of care was discussed with patient's family given poor prognosis Patient's son and spouse plan  for medical optimization if possible followed by discharge back to assisted living with hospice services - [facility] consult for hospice   A-fib with RVR New onset A-fib Developed RVR in the setting of septic shock Cards following, started on IV amiodarone, now switched to p.o., metoprolol and p.o. Amio Continue Eliquis   Acute on chronic diastolic CHF Hypoalbuminemia and third spacing Patient has bilateral lower EXTR edema and low albumin of 1.6. --Cardiology following -- IV diuretics changed, changed to p.o. diuretics   Severe hypoalbuminemia and malnutrition -Prognosis is poor -Encourage p.o. intake   Left forearm laceration -Clinically do not suspect shingles   Acute metabolic encephalopathy Underlying mild dementia While in ICU, patient was delirious and required Precedex drip for about 48 hours. Currently on Seroquel 25 mg nightly, melatonin 3 mg nightly   Dysphagia Seen by ST. High risk of aspiration Currently on dysphagia 1 diet   AKI on CKD 2 Baseline creatinine normal. Creatinine elevated in the setting of septic shock -Stable-currently under hospice care   Recurrent  urinary retention - Recurrent retention, Foley  catheter replaced on 10/7, now on bethanechol - Anticipate need for Foley at discharge for comfort    Restless legs -Resume home meds  Wounds-not clear if present on admission was not documented at that time Wound 07/04/24 2140 Pressure Injury Sacrum Right Deep Tissue Pressure Injury - Purple or maroon localized area of discolored intact skin or blood-filled blister due to damage of underlying soft tissue from pressure and/or shear. (Active)     Wound 07/04/24 2141 Pressure Injury Buttocks Right Deep Tissue Pressure Injury - Purple or maroon localized area of discolored intact skin or blood-filled blister due to damage of underlying soft tissue from pressure and/or shear. (Active)     Wound 07/04/24 2142 Pressure Injury Buttocks Right Stage 2 -  Partial thickness loss of dermis presenting as a shallow open injury with a red, pink wound bed without slough. (Active)      Medical Consultants:   Palliative care Cardiology PCCM   Discharge Exam:   Vitals:   07/05/24 0812 07/05/24 0816  BP: (!) 99/46 (!) 99/46  Pulse:  (!) 59  Resp: 17   Temp:    SpO2: 94%    Vitals:   07/05/24 0623 07/05/24 0641 07/05/24 0812 07/05/24 0816  BP: (!) 91/42 (!) 96/53 (!) 99/46 (!) 99/46  Pulse: (!) 54 (!) 56  (!) 59  Resp: 18  17   Temp: 98.1 F (36.7 C)     TempSrc: Axillary     SpO2: 93%  94%   Weight:      Height:        General exam: Appears calm and comfortable.-On nasal cannula   The results of significant diagnostics from this hospitalization (including imaging, microbiology, ancillary and laboratory) are listed below for reference.     Procedures and Diagnostic Studies:   ECHOCARDIOGRAM COMPLETE Result Date: 06/19/2024    ECHOCARDIOGRAM REPORT   Patient Name:   Briana Fitzgerald Date of Exam: 06/19/2024 Medical Rec #:  [id number]          Height:       63.5 in Accession #:    [id number]         Weight:       103.6 lb Date of Birth:  10/31/1931           BSA:          1.471 m  Patient Age:    89 years           BP:           98/49 mmHg Patient Gender: F                  HR:           128 bpm. Exam Location:  Inpatient Procedure: 2D Echo, Cardiac Doppler and Color Doppler (Both Spectral and Color            Flow Doppler were utilized during procedure). Indications:     Atrial Fibrillation I48.91  History:         Patient has prior history of Echocardiogram examinations, most                  recent 06/24/2023. CAD and hx of cardiac arrest,                  Signs/Symptoms:Shortness of Breath and Dyspnea; Risk  Factors:Sleep Apnea, Dyslipidemia and Hypertension.  Sonographer:     Koleen Popper RDCS Referring Phys:  [id number] LEITA JONELLE EINSTEIN Diagnosing Phys: Diannah Late Mallipeddi  Sonographer Comments: Image acquisition challenging due to patient body habitus and Image acquisition challenging due to respiratory motion. IMPRESSIONS  1. Poor Echo images.  2. Challenging study in the setting of tachycardia, HR 135 bpm.  3. Left ventricular ejection fraction, by estimation, is 50 to 55%. The left ventricle has low normal function. The left ventricle has no regional wall motion abnormalities. There is mild left ventricular hypertrophy. Left ventricular diastolic function  could not be evaluated.  4. Right ventricular systolic function is normal. The right ventricular size is mildly enlarged. There is normal pulmonary artery systolic pressure.  5. The mitral valve is abnormal. Mild mitral valve regurgitation. No evidence of mitral stenosis. Severe mitral annular calcification.  6. The aortic valve was not well visualized. There is moderate calcification of the aortic valve. Aortic valve regurgitation is not visualized. No aortic stenosis is present. Aortic valve mean gradient measures 3.0 mmHg.  7. The inferior vena cava is dilated in size with >50% respiratory variability, suggesting right atrial pressure of 8 mmHg. Comparison(s): No significant change from prior study. FINDINGS   Left Ventricle: Left ventricular ejection fraction, by estimation, is 50 to 55%. The left ventricle has low normal function. The left ventricle has no regional wall motion abnormalities. Strain was performed and the global longitudinal strain is indeterminate. The left ventricular internal cavity size was normal in size. There is mild left ventricular hypertrophy. Left ventricular diastolic function could not be evaluated due to atrial fibrillation. Left ventricular diastolic function could not be evaluated. Right Ventricle: The right ventricular size is mildly enlarged. No increase in right ventricular wall thickness. Right ventricular systolic function is normal. There is normal pulmonary artery systolic pressure. The tricuspid regurgitant velocity is 2.26  m/s, and with an assumed right atrial pressure of 8 mmHg, the estimated right ventricular systolic pressure is 28.4 mmHg. Left Atrium: Left atrial size was normal in size. Right Atrium: Right atrial size was normal in size. Pericardium: There is no evidence of pericardial effusion. Mitral Valve: The mitral valve is abnormal. Severe mitral annular calcification. Mild mitral valve regurgitation. No evidence of mitral valve stenosis. Tricuspid Valve: The tricuspid valve is normal in structure. Tricuspid valve regurgitation is mild . No evidence of tricuspid stenosis. Aortic Valve: The aortic valve was not well visualized. There is moderate calcification of the aortic valve. Aortic valve regurgitation is not visualized. No aortic stenosis is present. Aortic valve mean gradient measures 3.0 mmHg. Aortic valve peak gradient measures 5.5 mmHg. Aortic valve area, by VTI measures 1.73 cm. Pulmonic Valve: The pulmonic valve was normal in structure. Pulmonic valve regurgitation is mild. No evidence of pulmonic stenosis. Aorta: The aortic root and ascending aorta are structurally normal, with no evidence of dilitation. Venous: The inferior vena cava is dilated in size  with greater than 50% respiratory variability, suggesting right atrial pressure of 8 mmHg. IAS/Shunts: No atrial level shunt detected by color flow Doppler. Additional Comments: 3D was performed not requiring image post processing on an independent workstation and was indeterminate.  LEFT VENTRICLE PLAX 2D LVIDd:         3.86 cm LVIDs:         2.81 cm LV PW:         1.22 cm LV IVS:        1.35 cm LVOT diam:  1.83 cm LV SV:         29 LV SV Index:   20 LVOT Area:     2.63 cm  LV Volumes (MOD) LV vol d, MOD A2C: 52.5 ml LV vol s, MOD A2C: 23.7 ml LV SV MOD A2C:     28.8 ml RIGHT VENTRICLE             IVC RV Basal diam:  4.08 cm     IVC diam: 2.36 cm RV Mid diam:    3.34 cm RV S prime:     16.50 cm/s TAPSE (M-mode): 1.4 cm LEFT ATRIUM             Index        RIGHT ATRIUM           Index LA diam:        3.47 cm 2.36 cm/m   RA Area:     15.70 cm LA Vol (A2C):   38.5 ml 26.18 ml/m  RA Volume:   41.70 ml  28.35 ml/m LA Vol (A4C):   46.4 ml 31.55 ml/m LA Biplane Vol: 44.8 ml 30.46 ml/m  AORTIC VALVE AV Area (Vmax):    1.91 cm AV Area (Vmean):   1.81 cm AV Area (VTI):     1.73 cm AV Vmax:           117.00 cm/s AV Vmean:          80.200 cm/s AV VTI:            0.170 m AV Peak Grad:      5.5 mmHg AV Mean Grad:      3.0 mmHg LVOT Vmax:         85.00 cm/s LVOT Vmean:        55.300 cm/s LVOT VTI:          0.112 m LVOT/AV VTI ratio: 0.66  AORTA Ao Root diam: 3.14 cm Ao Asc diam:  3.83 cm TRICUSPID VALVE TR Peak grad:   20.4 mmHg TR Vmax:        226.00 cm/s  SHUNTS Systemic VTI:  0.11 m Systemic Diam: 1.83 cm Vishnu Priya Mallipeddi Electronically signed by Diannah Late Mallipeddi Signature Date/Time: 06/19/2024/1:12:41 PM    Final (Updated)    CT HEAD WO CONTRAST ( ) Result Date: 06/18/2024 EXAM: CT HEAD WITHOUT CONTRAST 06/18/2024 10:23:04 PM TECHNIQUE: CT of the head was performed without the administration of intravenous contrast. Automated exposure control, iterative reconstruction, and/or weight based  adjustment of the mA/kV was utilized to reduce the radiation dose to as low as reasonably achievable. COMPARISON: 04/04/2024 CLINICAL HISTORY: Mental status change, unknown cause. FINDINGS: BRAIN AND VENTRICLES: Chronic ischemic changes and mild volume loss. No acute hemorrhage, acute infarct, hydrocephalus, extra-axial collection, mass effect, or midline shift. ORBITS: No acute abnormality. SINUSES: No acute abnormality. SOFT TISSUES AND SKULL: No acute soft tissue abnormality. No skull fracture. IMPRESSION: 1. No acute intracranial abnormality. 2. Chronic ischemic microvascular changes with mild cerebral volume loss. Electronically signed by: Franky Stanford MD 06/18/2024 10:28 PM EDT RP Workstation: [id number]   DG Chest Port 1 View Result Date: 06/18/2024 CLINICAL DATA:  Fever. EXAM: PORTABLE CHEST 1 VIEW COMPARISON:  May 27, 2018. FINDINGS: Mild cardiomegaly is noted. Bibasilar opacities are noted concerning for pneumonia. Left upper lobe opacity is noted which may represent pneumonia, but underlying neoplasm or malignancy cannot be excluded. Left perihilar prominence is noted concerning for possible mass. CT scan of the chest is recommended for  further evaluation. Postsurgical changes are seen involving proximal left humerus. IMPRESSION: 1. Left upper lobe opacity is noted which may represent pneumonia, but underlying neoplasm or malignancy cannot be excluded. Left perihilar prominence is noted concerning for possible mass. CT scan of the chest is recommended for further evaluation. 2. Bibasilar opacities are noted concerning for pneumonia. Electronically Signed   By: Lynwood Landy Raddle M.D.   On: 06/18/2024 17:57     Labs:   Basic Metabolic Panel: Recent Labs  Lab 07/01/24 0222 07/02/24 0218 07/03/24 0634 07/04/24 0755 07/05/24 0724  NA 139 138 138 138 136  K 3.5 3.6 3.4* 3.8 3.7  CL 101 100 100 100 99  CO2 30 29 29 28 30   GLUCOSE 109* 103* 105* 100* 90  BUN 36* 40* 42* 42* 44*   CREATININE 1.01* 1.16* 1.14* 1.12* 1.15*  CALCIUM  9.3 9.1 8.9 9.1 8.9   GFR Estimated Creatinine Clearance: 27.5 mL/min (A) (by C-G formula based on SCr of 1.15 mg/dL (H)). Liver Function Tests: No results for input(s): AST, ALT, ALKPHOS, BILITOT, PROT, ALBUMIN in the last 168 hours. No results for input(s): LIPASE, AMYLASE in the last 168 hours. No results for input(s): AMMONIA in the last 168 hours. Coagulation profile No results for input(s): INR, PROTIME in the last 168 hours.  CBC: Recent Labs  Lab 06/30/24 0500 07/01/24 0222 07/02/24 0218  WBC 8.1 7.9 8.5  HGB 9.9* 9.4* 9.6*  HCT 29.9* 29.4* 29.5*  MCV 83.5 85.0 83.6  PLT 202 211 204   Cardiac Enzymes: No results for input(s): CKTOTAL, CKMB, CKMBINDEX, TROPONINI in the last 168 hours. BNP: Invalid input(s): POCBNP CBG: Recent Labs  Lab 06/28/24 2101  GLUCAP 121*   D-Dimer No results for input(s): DDIMER in the last 72 hours. Hgb A1c No results for input(s): HGBA1C in the last 72 hours. Lipid Profile No results for input(s): CHOL, HDL, LDLCALC, TRIG, CHOLHDL, LDLDIRECT in the last 72 hours. Thyroid  function studies No results for input(s): TSH, T4TOTAL, T3FREE, THYROIDAB in the last 72 hours.  Invalid input(s): FREET3 Anemia work up No results for input(s): VITAMINB12, FOLATE, FERRITIN, TIBC, IRON, RETICCTPCT in the last 72 hours. Microbiology Recent Results (from the past 240 hours)  Culture, blood (Routine X 2) w Reflex to ID Panel     Status: None   Collection Time: 06/26/24  8:17 PM   Specimen: BLOOD  Result Value Ref Range Status   Specimen Description BLOOD SITE NOT SPECIFIED  Final   Special Requests Immunocompromised  Final   Culture   Final    NO GROWTH 5 DAYS Performed at [facility], [address] [place], [place] [address]    Report Status 07/01/2024 FINAL  Final  Culture, blood (Routine X 2) w Reflex to ID  Panel     Status: None   Collection Time: 06/26/24  8:20 PM   Specimen: BLOOD  Result Value Ref Range Status   Specimen Description BLOOD SITE NOT SPECIFIED  Final   Special Requests   Final    Immunocompromised BOTTLES DRAWN AEROBIC AND ANAEROBIC Blood Culture adequate volume   Culture   Final    NO GROWTH 5 DAYS Performed at [facility], [address] [place], [place] [address]    Report Status 07/01/2024 FINAL  Final     Discharge Instructions:   Discharge Instructions     (HEART FAILURE PATIENTS) Call MD:  Anytime you have any of the following symptoms: 1) 3 pound weight  gain in 24 hours or 5 pounds in 1 week 2) shortness of breath, with or without a dry hacking cough 3) swelling in the hands, feet or stomach 4) if you have to sleep on extra pillows at night in order to breathe.   Complete by: As directed    Discharge instructions   Complete by: As directed    DYS 1 diet   Discharge wound care:   Complete by: As directed    Wound care  Daily      Cleanse wounds with saline, pat dry Cover each wound with single layer of xeroform, top with foam. Change daily   Increase activity slowly   Complete by: As directed       Allergies as of 07/05/2024       Reactions   Penicillins Hives, Rash, Other (See Comments)   Blister with PCN Tolerated Augmentin  on 2 separate occasions        Medication List     TAKE these medications    acetaminophen  500 MG tablet Commonly known as: TYLENOL  Take 500 mg by mouth every 6 (six) hours as needed.   AMBULATORY NON FORMULARY MEDICATION Rolling walker disp 1   amiodarone 200 MG tablet Commonly known as: PACERONE Take 0.5 tablets (100 mg total) by mouth daily. Start taking on: July 06, 2024   apixaban 2.5 MG Tabs tablet Commonly known as: ELIQUIS Take 1 tablet (2.5 mg total) by mouth 2 (two) times daily.   bisacodyl  5 MG EC tablet Commonly known as: DULCOLAX Take 1 tablet (5 mg total) by mouth daily as needed  for severe constipation.   cefadroxil 500 MG capsule Commonly known as: DURICEF Take 1 capsule (500 mg total) by mouth 2 (two) times daily.   metoprolol succinate 25 MG 24 hr tablet Commonly known as: TOPROL-XL Take 1 tablet (25 mg total) by mouth daily. Start taking on: July 06, 2024   midodrine 5 MG tablet Commonly known as: PROAMATINE Take 1 tablet (5 mg total) by mouth 3 (three) times daily with meals.   potassium chloride  SA 20 MEQ tablet Commonly known as: KLOR-CON  M TAKE 1 TABLET BY MOUTH EVERY DAY What changed: when to take this   pramipexole  0.5 MG tablet Commonly known as: MIRAPEX  TAKE 1 TABLET BY MOUTH AT BEDTIME AS NEEDED.AS NEEDED FOR RESTLESS LEGS. What changed: See the new instructions.   QUEtiapine 25 MG tablet Commonly known as: SEROQUEL Take 1 tablet (25 mg total) by mouth at bedtime.   torsemide  20 MG tablet Commonly known as: DEMADEX  Take 2 tablets (40 mg total) by mouth 2 (two) times daily. What changed:  how much to take when to take this               Discharge Care Instructions  (From admission, onward)           Start     Ordered   07/05/24 0000  Discharge wound care:       Comments: Wound care  Daily      Cleanse wounds with saline, pat dry Cover each wound with single layer of xeroform, top with foam. Change daily   07/05/24 1000              Time coordinating discharge: 45 minutes  Signed:  Harlene RAYMOND Bowl DO  [facility] 07/05/2024, 10:09 AM

## 2024-07-05 NOTE — Progress Notes (Signed)
 Wound care:  Cleanse wounds with saline, pat dry  Cover each wound with single layer of xeroform, top with foam. Change daily

## 2024-07-05 NOTE — Progress Notes (Signed)
 Dressing changed on buttocks: Cleansed wounds with saline, pat them dry, Covered each wound with single layer of xeroform, and topped with a foam.

## 2024-07-06 ENCOUNTER — Telehealth: Payer: Self-pay | Admitting: *Deleted

## 2024-07-06 NOTE — Telephone Encounter (Signed)
 Copied from CRM [id number]. Topic: General - Other >> Jul 06, 2024  3:32 PM Frederich PARAS wrote: Reason for CRM: [facility] needs the order for patient . Pt callback # is [phone] , fax # [phone]

## 2024-07-06 NOTE — Telephone Encounter (Signed)
 Left a message for Briana Fitzgerald to return my call.

## 2024-07-06 NOTE — Progress Notes (Addendum)
 10/21 at 2:00 PM Updated DC Plan section on FL2 and sent to Rebekah per her request.  Rebekah had questions about hospice order - notified Melissa with [facility].

## 2024-07-07 ENCOUNTER — Telehealth: Payer: Self-pay

## 2024-07-07 DIAGNOSIS — R627 Adult failure to thrive: Secondary | ICD-10-CM

## 2024-07-07 NOTE — Telephone Encounter (Signed)
 Referral placed.

## 2024-07-07 NOTE — Addendum Note (Signed)
 Addended by: METTA KRISTEN CROME on: 07/07/2024 12:54 PM   Modules accepted: Orders

## 2024-07-07 NOTE — Telephone Encounter (Signed)
 Copied from CRM [id number]. Topic: Clinical - Medical Advice >> Jul 07, 2024  2:11 PM Harlene ORN wrote: Reason for CRM: Patient's son called. Patient was discharged from the hospital on 10/20 and is currently in the [facility] in [place]. Wants to know what information the PCP is needing to help expedite the process of needed paperwork for [facility]. Please call back the son to discuss further: [phone]

## 2024-07-07 NOTE — Telephone Encounter (Signed)
 Please see most recent encounter

## 2024-07-07 NOTE — Telephone Encounter (Signed)
 Left a message for the patient to return my call.

## 2024-07-07 NOTE — Telephone Encounter (Signed)
 Copied from CRM [id number]. Topic: Clinical - Medical Advice >> Jul 07, 2024  9:41 AM Anairis L wrote: Reason for CRM: Charmaine Calling to have Hospice care orders Fax to [phone].  [phone]

## 2024-07-08 NOTE — Telephone Encounter (Signed)
 Patient's son Zachary informed referral was placed yesterday and he reported that [facility] has reached out to them and nothing further is needed at this time

## 2024-08-16 DEATH — deceased

## 2024-09-22 ENCOUNTER — Ambulatory Visit: Admitting: Cardiology
